# Patient Record
Sex: Male | Born: 1945 | Race: White | Hispanic: No | Smoking: Former smoker
Health system: Southern US, Community
[De-identification: ages and names within clinical notes are randomized; demographics above are authoritative.]

## PROBLEM LIST (undated history)

## (undated) DIAGNOSIS — T7840XA Allergy, unspecified, initial encounter: Secondary | ICD-10-CM

## (undated) DIAGNOSIS — G4730 Sleep apnea, unspecified: Secondary | ICD-10-CM

## (undated) DIAGNOSIS — F419 Anxiety disorder, unspecified: Secondary | ICD-10-CM

## (undated) DIAGNOSIS — M109 Gout, unspecified: Secondary | ICD-10-CM

## (undated) DIAGNOSIS — D649 Anemia, unspecified: Secondary | ICD-10-CM

## (undated) DIAGNOSIS — R0600 Dyspnea, unspecified: Secondary | ICD-10-CM

## (undated) DIAGNOSIS — I2510 Atherosclerotic heart disease of native coronary artery without angina pectoris: Secondary | ICD-10-CM

## (undated) DIAGNOSIS — G629 Polyneuropathy, unspecified: Secondary | ICD-10-CM

## (undated) DIAGNOSIS — K759 Inflammatory liver disease, unspecified: Secondary | ICD-10-CM

## (undated) DIAGNOSIS — R519 Headache, unspecified: Secondary | ICD-10-CM

## (undated) DIAGNOSIS — E119 Type 2 diabetes mellitus without complications: Secondary | ICD-10-CM

## (undated) DIAGNOSIS — K432 Incisional hernia without obstruction or gangrene: Secondary | ICD-10-CM

## (undated) DIAGNOSIS — E785 Hyperlipidemia, unspecified: Secondary | ICD-10-CM

## (undated) DIAGNOSIS — I10 Essential (primary) hypertension: Secondary | ICD-10-CM

## (undated) DIAGNOSIS — R079 Chest pain, unspecified: Secondary | ICD-10-CM

## (undated) HISTORY — DX: Essential (primary) hypertension: I10

## (undated) HISTORY — PX: TONSILLECTOMY: SUR1361

## (undated) HISTORY — DX: Gout, unspecified: M10.9

## (undated) HISTORY — DX: Polyneuropathy, unspecified: G62.9

## (undated) HISTORY — DX: Chest pain, unspecified: R07.9

## (undated) HISTORY — DX: Atherosclerotic heart disease of native coronary artery without angina pectoris: I25.10

## (undated) HISTORY — DX: Anxiety disorder, unspecified: F41.9

## (undated) HISTORY — DX: Headache, unspecified: R51.9

## (undated) HISTORY — DX: Allergy, unspecified, initial encounter: T78.40XA

## (undated) HISTORY — DX: Inflammatory liver disease, unspecified: K75.9

## (undated) HISTORY — DX: Type 2 diabetes mellitus without complications: E11.9

## (undated) HISTORY — PX: OTHER SURGICAL HISTORY: SHX169

## (undated) HISTORY — DX: Incisional hernia without obstruction or gangrene: K43.2

## (undated) HISTORY — DX: Hyperlipidemia, unspecified: E78.5

## (undated) HISTORY — DX: Anemia, unspecified: D64.9

## (undated) HISTORY — DX: Dyspnea, unspecified: R06.00

## (undated) HISTORY — DX: Sleep apnea, unspecified: G47.30

---

## 2000-08-12 ENCOUNTER — Emergency Department (HOSPITAL_COMMUNITY): Admission: EM | Admit: 2000-08-12 | Discharge: 2000-08-12 | Payer: Self-pay | Admitting: Emergency Medicine

## 2000-08-24 ENCOUNTER — Emergency Department (HOSPITAL_COMMUNITY): Admission: EM | Admit: 2000-08-24 | Discharge: 2000-08-24 | Payer: Self-pay | Admitting: Emergency Medicine

## 2000-08-26 ENCOUNTER — Emergency Department (HOSPITAL_COMMUNITY): Admission: EM | Admit: 2000-08-26 | Discharge: 2000-08-26 | Payer: Self-pay | Admitting: Emergency Medicine

## 2000-08-26 ENCOUNTER — Encounter: Payer: Self-pay | Admitting: Neurology

## 2004-10-27 ENCOUNTER — Emergency Department: Payer: Self-pay | Admitting: Emergency Medicine

## 2008-04-30 HISTORY — PX: OTHER SURGICAL HISTORY: SHX169

## 2012-01-29 DEATH — deceased

## 2018-02-21 ENCOUNTER — Encounter: Payer: Self-pay | Admitting: Cardiovascular Disease

## 2018-02-25 ENCOUNTER — Encounter

## 2018-02-25 ENCOUNTER — Encounter: Payer: Self-pay | Admitting: Cardiovascular Disease

## 2018-02-25 ENCOUNTER — Ambulatory Visit: Payer: Medicare HMO | Admitting: Cardiovascular Disease

## 2018-02-25 DIAGNOSIS — R079 Chest pain, unspecified: Secondary | ICD-10-CM | POA: Insufficient documentation

## 2018-02-25 DIAGNOSIS — I208 Other forms of angina pectoris: Secondary | ICD-10-CM

## 2018-02-25 DIAGNOSIS — E7800 Pure hypercholesterolemia, unspecified: Secondary | ICD-10-CM

## 2018-02-25 DIAGNOSIS — I10 Essential (primary) hypertension: Secondary | ICD-10-CM

## 2018-02-25 DIAGNOSIS — E785 Hyperlipidemia, unspecified: Secondary | ICD-10-CM | POA: Insufficient documentation

## 2018-02-25 LAB — BASIC METABOLIC PANEL
BUN/Creatinine Ratio: 12 (ref 10–24)
BUN: 13 mg/dL (ref 8–27)
CALCIUM: 9.6 mg/dL (ref 8.6–10.2)
CO2: 20 mmol/L (ref 20–29)
CREATININE: 1.11 mg/dL (ref 0.76–1.27)
Chloride: 95 mmol/L — ABNORMAL LOW (ref 96–106)
GFR calc Af Amer: 75 mL/min/1.73 (ref >59)
GFR, EST NON AFRICAN AMERICAN: 65 mL/min/1.73 (ref >59)
Glucose: 168 mg/dL — ABNORMAL HIGH (ref 65–99)
Potassium: 4.1 mmol/L (ref 3.5–5.2)
Sodium: 134 mmol/L (ref 134–144)

## 2018-02-25 LAB — CBC WITH DIFFERENTIAL/PLATELET
BASOS: 1 %
Basophils Absolute: 0.1 x10E3/uL (ref 0.0–0.2)
EOS (ABSOLUTE): 0.2 x10E3/uL (ref 0.0–0.4)
EOS: 3 %
Hematocrit: 34.9 % — ABNORMAL LOW (ref 37.5–51.0)
Hemoglobin: 11.8 g/dL — ABNORMAL LOW (ref 13.0–17.7)
IMMATURE GRANS (ABS): 0.1 x10E3/uL (ref 0.0–0.1)
IMMATURE GRANULOCYTES: 1 %
LYMPHS: 18 %
Lymphocytes Absolute: 1.3 x10E3/uL (ref 0.7–3.1)
MCH: 28.1 pg (ref 26.6–33.0)
MCHC: 33.8 g/dL (ref 31.5–35.7)
MCV: 83 fL (ref 79–97)
Monocytes Absolute: 0.6 x10E3/uL (ref 0.1–0.9)
Monocytes: 9 %
Neutrophils Absolute: 4.8 x10E3/uL (ref 1.4–7.0)
Neutrophils: 68 %
PLATELETS: 214 x10E3/uL (ref 150–450)
RBC: 4.2 x10E6/uL (ref 4.14–5.80)
RDW: 14.5 % (ref 12.3–15.4)
WBC: 7 x10E3/uL (ref 3.4–10.8)

## 2018-02-25 LAB — TSH: TSH: 3.64 uIU/mL (ref 0.450–4.500)

## 2018-02-25 NOTE — Progress Notes (Signed)
   02/25/2018 [name]   02/22/1943  [id number]  Primary Physician Arvind, Moogali M, MD Primary Cardiologist: Adynn Caseres J Xena Propst MD FACP, FACC, FAHA, FSCAI  HPI:  [name] is a 72 y.o. moderately overweight widowed Caucasian male father of 2, grandfather of 6 granddaughters referred by Dr. Rosario for cardiac catheterization because of new onset exertional chest pain and an abnormal Myoview stress test.  His risk factors include treated hypertension, diabetes and hyperlipidemia.  He is never had a heart attack or stroke.  There is no family history of heart disease.  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  He smoked remotely and stopped in 1982.  He is fairly active and golfs several times a week.  He gets chest pain after the first hole.  Recent 2D echo was essentially normal and a Myoview stress test did show inferior nontransmural scar with moderate lateral ischemic changes.   Current Meds  Medication Sig  . amitriptyline (ELAVIL) 50 MG tablet Take 50 mg by mouth daily.  . amLODipine (NORVASC) 5 MG tablet Take 5 mg by mouth daily.  . aspirin 325 MG tablet Take 325 mg by mouth daily.  . Calcium Carb-Cholecalciferol (CALCIUM 600+D3) 600-800 MG-UNIT TABS Take by mouth.  . Cholecalciferol (VITAMIN D3) 2000 units TABS Take 2,000 Units by mouth daily.  . docusate sodium (COLACE) 100 MG capsule Take 100 mg by mouth daily.  . finasteride (PROSCAR) 5 MG tablet Take 5 mg by mouth daily.  . loratadine (CLARITIN) 10 MG tablet Take 10 mg by mouth daily.  . losartan (COZAAR) 100 MG tablet Take 100 mg by mouth daily.  . metFORMIN (GLUCOPHAGE) 1000 MG tablet Take 1,000 mg by mouth 2 (two) times daily.  . Multiple Vitamins-Minerals (MULTIVITAMIN ADULT PO) Take 1 tablet by mouth daily.  . nitroGLYCERIN (NITROSTAT) 0.4 MG SL tablet   . pravastatin (PRAVACHOL) 40 MG tablet Take 40 mg by mouth daily.     Allergies  Allergen Reactions  . Ciprofloxacin     Social  History   Socioeconomic History  . Marital status: Single    Spouse name: Not on file  . Number of children: Not on file  . Years of education: Not on file  . Highest education level: Not on file  Occupational History  . Not on file  Social Needs  . Financial resource strain: Not on file  . Food insecurity:    Worry: Not on file    Inability: Not on file  . Transportation needs:    Medical: Not on file    Non-medical: Not on file  Tobacco Use  . Smoking status: Former Smoker  . Smokeless tobacco: Never Used  Substance and Sexual Activity  . Alcohol use: Not on file  . Drug use: Not on file  . Sexual activity: Not on file  Lifestyle  . Physical activity:    Days per week: Not on file    Minutes per session: Not on file  . Stress: Not on file  Relationships  . Social connections:    Talks on phone: Not on file    Gets together: Not on file    Attends religious service: Not on file    Active member of club or organization: Not on file    Attends meetings of clubs or organizations: Not on file    Relationship status: Not on file  . Intimate partner violence:    Fear of current or ex partner:   Not on file    Emotionally abused: Not on file    Physically abused: Not on file    Forced sexual activity: Not on file  Other Topics Concern  . Not on file  Social History Narrative  . Not on file     Review of Systems: General: negative for chills, fever, night sweats or weight changes.  Cardiovascular: negative for chest pain, dyspnea on exertion, edema, orthopnea, palpitations, paroxysmal nocturnal dyspnea or shortness of breath Dermatological: negative for rash Respiratory: negative for cough or wheezing Urologic: negative for hematuria Abdominal: negative for nausea, vomiting, diarrhea, bright red blood per rectum, melena, or hematemesis Neurologic: negative for visual changes, syncope, or dizziness All other systems reviewed and are otherwise negative except as noted  above.    Blood pressure 126/80, pulse 96, height 5' 11" (1.803 m), weight 278 lb (126.1 kg).  General appearance: alert and no distress Neck: no adenopathy, no carotid bruit, no JVD, supple, symmetrical, trachea midline and thyroid not enlarged, symmetric, no tenderness/mass/nodules Lungs: clear to auscultation bilaterally Heart: regular rate and rhythm, S1, S2 normal, no murmur, click, rub or gallop Extremities: extremities normal, atraumatic, no cyanosis or edema Pulses: 2+ and symmetric Skin: Skin color, texture, turgor normal. No rashes or lesions Neurologic: Alert and oriented X 3, normal strength and tone. Normal symmetric reflexes. Normal coordination and gait  EKG sinus rhythm at 96 with nonspecific ST and T wave changes.  Personally reviewed this EKG.  ASSESSMENT AND PLAN:   Chest pain [name] was referred by Dr. Rosario for cardiac catheterization because of new onset predictable exertional chest pain and abnormal stress test.  We will proceed with outpatient radial diagnostic cath this Thursday. I have reviewed the risks, indications, and alternatives to cardiac catheterization, possible angioplasty, and stenting with the patient. Risks include but are not limited to bleeding, infection, vascular injury, stroke, myocardial infection, arrhythmia, kidney injury, radiation-related injury in the case of prolonged fluoroscopy use, emergency cardiac surgery, and death. The patient understands the risks of serious complication is 1-2 in 1000 with diagnostic cardiac cath and 1-2% or less with angioplasty/stenting.   Essential hypertension History of essential hypertension her blood pressure measured today at 126/80.  He is on amlodipine and losartan.  Continue current meds at current dosing.  Hyperlipidemia History of hyperlipidemia on statin therapy      Landrum Carbonell J. Jamel Dunton MD FACP,FACC,FAHA, FSCAI 02/25/2018 9:10 AM 

## 2018-02-25 NOTE — Patient Instructions (Signed)
Medication Instructions:  Your physician recommends that you continue on your current medications as directed. Please refer to the Current Medication list given to you today.  If you need a refill on your cardiac medications before your next appointment, please call your pharmacy.   Lab work: Your physician recommends that you return for lab work in: TODAY  If you have labs (blood work) drawn today and your tests are completely normal, you will receive your results only by: Marland Kitchen MyChart Message (if you have MyChart) OR . A paper copy in the mail If you have any lab test that is abnormal or we need to change your treatment, we will call you to review the results.  Testing/Procedures: Your physician has requested that you have a cardiac catheterization. Cardiac catheterization is used to diagnose and/or treat various heart conditions. Doctors may recommend this procedure for a number of different reasons. The most common reason is to evaluate chest pain. Chest pain can be a symptom of coronary artery disease (CAD), and cardiac catheterization can show whether plaque is narrowing or blocking your heart's arteries. This procedure is also used to evaluate the valves, as well as measure the blood flow and oxygen levels in different parts of your heart. For further information please visit [email or web address] Please follow instruction sheet, as given.   Follow-Up: At [facility], you and your health needs are our priority.  As part of our continuing mission to provide you with exceptional heart care, we have created designated Provider Care Teams.  These Care Teams include your primary Cardiologist (physician) and Advanced Practice Providers (APPs -  Physician Assistants and Nurse Practitioners) who all work together to provide you with the care you need, when you need it. You will need a follow up appointment in 4 weeks.  Please call our office 2 months in advance to schedule this appointment.  You may  see Dr. Allyson Sabal or one of the following Advanced Practice Providers on your designated Care Team:   Corine Shelter, PA-C Judy Pimple, [place] . Marjie Skiff, PA-C  Any Other Special Instructions Will Be Listed Below (If Applicable).

## 2018-02-25 NOTE — Assessment & Plan Note (Signed)
History of hyperlipidemia on statin therapy. 

## 2018-02-25 NOTE — H&P (View-Only) (Signed)
02/25/2018 [name]   1942-07-01  [id number]  Primary Physician Karle Plumber, MD Primary Cardiologist: Runell Gess MD Nicholes Calamity, [place]  HPI:  [name] is a 72 y.o. moderately overweight widowed Caucasian male father of 2, grandfather of 6 granddaughters referred by Dr. Hanley Hays for cardiac catheterization because of new onset exertional chest pain and an abnormal Myoview stress test.  His risk factors include treated hypertension, diabetes and hyperlipidemia.  He is never had a heart attack or stroke.  There is no family history of heart disease.  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  He smoked remotely and stopped in 1982.  He is fairly active and golfs several times a week.  He gets chest pain after the first hole.  Recent 2D echo was essentially normal and a Myoview stress test did show inferior nontransmural scar with moderate lateral ischemic changes.   Current Meds  Medication Sig  . amitriptyline (ELAVIL) 50 MG tablet Take 50 mg by mouth daily.  Marland Kitchen amLODipine (NORVASC) 5 MG tablet Take 5 mg by mouth daily.  Marland Kitchen aspirin 325 MG tablet Take 325 mg by mouth daily.  . Calcium Carb-Cholecalciferol (CALCIUM 600+D3) 600-800 MG-UNIT TABS Take by mouth.  . Cholecalciferol (VITAMIN D3) 2000 units TABS Take 2,000 Units by mouth daily.  Marland Kitchen docusate sodium (COLACE) 100 MG capsule Take 100 mg by mouth daily.  . finasteride (PROSCAR) 5 MG tablet Take 5 mg by mouth daily.  Marland Kitchen loratadine (CLARITIN) 10 MG tablet Take 10 mg by mouth daily.  Marland Kitchen losartan (COZAAR) 100 MG tablet Take 100 mg by mouth daily.  . metFORMIN (GLUCOPHAGE) 1000 MG tablet Take 1,000 mg by mouth 2 (two) times daily.  . Multiple Vitamins-Minerals (MULTIVITAMIN ADULT PO) Take 1 tablet by mouth daily.  . nitroGLYCERIN (NITROSTAT) 0.4 MG SL tablet   . pravastatin (PRAVACHOL) 40 MG tablet Take 40 mg by mouth daily.     Allergies  Allergen Reactions  . Ciprofloxacin     Social  History   Socioeconomic History  . Marital status: Single    Spouse name: Not on file  . Number of children: Not on file  . Years of education: Not on file  . Highest education level: Not on file  Occupational History  . Not on file  Social Needs  . Financial resource strain: Not on file  . Food insecurity:    Worry: Not on file    Inability: Not on file  . Transportation needs:    Medical: Not on file    Non-medical: Not on file  Tobacco Use  . Smoking status: Former Games developer  . Smokeless tobacco: Never Used  Substance and Sexual Activity  . Alcohol use: Not on file  . Drug use: Not on file  . Sexual activity: Not on file  Lifestyle  . Physical activity:    Days per week: Not on file    Minutes per session: Not on file  . Stress: Not on file  Relationships  . Social connections:    Talks on phone: Not on file    Gets together: Not on file    Attends religious service: Not on file    Active member of club or organization: Not on file    Attends meetings of clubs or organizations: Not on file    Relationship status: Not on file  . Intimate partner violence:    Fear of current or ex partner:  Not on file    Emotionally abused: Not on file    Physically abused: Not on file    Forced sexual activity: Not on file  Other Topics Concern  . Not on file  Social History Narrative  . Not on file     Review of Systems: General: negative for chills, fever, night sweats or weight changes.  Cardiovascular: negative for chest pain, dyspnea on exertion, edema, orthopnea, palpitations, paroxysmal nocturnal dyspnea or shortness of breath Dermatological: negative for rash Respiratory: negative for cough or wheezing Urologic: negative for hematuria Abdominal: negative for nausea, vomiting, diarrhea, bright red blood per rectum, melena, or hematemesis Neurologic: negative for visual changes, syncope, or dizziness All other systems reviewed and are otherwise negative except as noted  above.    Blood pressure 126/80, pulse 96, height 5\' 11"  (1.803 m), weight 278 lb (126.1 kg).  General appearance: alert and no distress Neck: no adenopathy, no carotid bruit, no JVD, supple, symmetrical, trachea midline and thyroid not enlarged, symmetric, no tenderness/mass/nodules Lungs: clear to auscultation bilaterally Heart: regular rate and rhythm, S1, S2 normal, no murmur, click, rub or gallop Extremities: extremities normal, atraumatic, no cyanosis or edema Pulses: 2+ and symmetric Skin: Skin color, texture, turgor normal. No rashes or lesions Neurologic: Alert and oriented X 3, normal strength and tone. Normal symmetric reflexes. Normal coordination and gait  EKG sinus rhythm at 96 with nonspecific ST and T wave changes.  Personally reviewed this EKG.  ASSESSMENT AND PLAN:   Chest pain Mr. Wurzer was referred by Dr. Hanley Hays for cardiac catheterization because of new onset predictable exertional chest pain and abnormal stress test.  We will proceed with outpatient radial diagnostic cath this Thursday. I have reviewed the risks, indications, and alternatives to cardiac catheterization, possible angioplasty, and stenting with the patient. Risks include but are not limited to bleeding, infection, vascular injury, stroke, myocardial infection, arrhythmia, kidney injury, radiation-related injury in the case of prolonged fluoroscopy use, emergency cardiac surgery, and death. The patient understands the risks of serious complication is 1-2 in 1000 with diagnostic cardiac cath and 1-2% or less with angioplasty/stenting.   Essential hypertension History of essential hypertension her blood pressure measured today at 126/80.  He is on amlodipine and losartan.  Continue current meds at current dosing.  Hyperlipidemia History of hyperlipidemia on statin therapy      Runell Gess MD [facility] 02/25/2018 9:10 AM

## 2018-02-25 NOTE — Assessment & Plan Note (Signed)
History of essential hypertension her blood pressure measured today at 126/80.  He is on amlodipine and losartan.  Continue current meds at current dosing.

## 2018-02-25 NOTE — Assessment & Plan Note (Signed)
Mr. Schwartz was referred by Dr. Hanley Hays for cardiac catheterization because of new onset predictable exertional chest pain and abnormal stress test.  We will proceed with outpatient radial diagnostic cath this Thursday. I have reviewed the risks, indications, and alternatives to cardiac catheterization, possible angioplasty, and stenting with the patient. Risks include but are not limited to bleeding, infection, vascular injury, stroke, myocardial infection, arrhythmia, kidney injury, radiation-related injury in the case of prolonged fluoroscopy use, emergency cardiac surgery, and death. The patient understands the risks of serious complication is 1-2 in 1000 with diagnostic cardiac cath and 1-2% or less with angioplasty/stenting.

## 2018-02-26 ENCOUNTER — Telehealth: Payer: Self-pay | Admitting: *Deleted

## 2018-02-26 NOTE — Telephone Encounter (Signed)
Pt contacted pre-catheterization scheduled at [facility] for: Thursday February 27, 2018 9:30 AM Verified arrival time and place: [facility] Main Entrance A at: 7:30 AM  No solid food after midnight prior to cath, clear liquids until 5 AM day of procedure. Contrast allergy: no  Hold: Metformin-day of procedure and 48 hours post procedure.  Except hold medications AM meds can be  taken pre-cath with sip of water including: ASA 325 mg   Pt states he was instructed by Dr Allyson Sabal to hold losartan and norvasc AM of procedure.  Confirmed patient has responsible person to drive home post procedure and for 24 hours after you arrive home: yes

## 2018-02-27 ENCOUNTER — Ambulatory Visit (HOSPITAL_COMMUNITY)
Admission: RE | Admit: 2018-02-27 | Discharge: 2018-02-27 | Disposition: A | Discharge status: Home / Self Care | Payer: Medicare HMO | Source: Ambulatory Visit | Attending: Cardiovascular Disease | Admitting: Cardiovascular Disease

## 2018-02-27 ENCOUNTER — Encounter (HOSPITAL_COMMUNITY): Payer: Self-pay | Admitting: Cardiovascular Disease

## 2018-02-27 ENCOUNTER — Encounter (HOSPITAL_COMMUNITY)
Admission: RE | Disposition: A | Discharge status: Home / Self Care | Payer: Self-pay | Source: Ambulatory Visit | Attending: Cardiovascular Disease

## 2018-02-27 ENCOUNTER — Other Ambulatory Visit: Payer: Self-pay

## 2018-02-27 DIAGNOSIS — Z87891 Personal history of nicotine dependence: Secondary | ICD-10-CM | POA: Insufficient documentation

## 2018-02-27 DIAGNOSIS — I10 Essential (primary) hypertension: Secondary | ICD-10-CM | POA: Insufficient documentation

## 2018-02-27 DIAGNOSIS — E663 Overweight: Secondary | ICD-10-CM | POA: Diagnosis not present

## 2018-02-27 DIAGNOSIS — Z7982 Long term (current) use of aspirin: Secondary | ICD-10-CM | POA: Diagnosis not present

## 2018-02-27 DIAGNOSIS — I2584 Coronary atherosclerosis due to calcified coronary lesion: Secondary | ICD-10-CM | POA: Diagnosis not present

## 2018-02-27 DIAGNOSIS — I208 Other forms of angina pectoris: Secondary | ICD-10-CM

## 2018-02-27 DIAGNOSIS — Z7984 Long term (current) use of oral hypoglycemic drugs: Secondary | ICD-10-CM | POA: Insufficient documentation

## 2018-02-27 DIAGNOSIS — E119 Type 2 diabetes mellitus without complications: Secondary | ICD-10-CM | POA: Insufficient documentation

## 2018-02-27 DIAGNOSIS — E785 Hyperlipidemia, unspecified: Secondary | ICD-10-CM | POA: Diagnosis not present

## 2018-02-27 DIAGNOSIS — I25118 Atherosclerotic heart disease of native coronary artery with other forms of angina pectoris: Secondary | ICD-10-CM | POA: Insufficient documentation

## 2018-02-27 DIAGNOSIS — Z6838 Body mass index (BMI) 38.0-38.9, adult: Secondary | ICD-10-CM | POA: Insufficient documentation

## 2018-02-27 DIAGNOSIS — E7800 Pure hypercholesterolemia, unspecified: Secondary | ICD-10-CM

## 2018-02-27 HISTORY — PX: LEFT HEART CATH AND CORONARY ANGIOGRAPHY: CATH118249

## 2018-02-27 LAB — GLUCOSE, CAPILLARY: Glucose-Capillary: 171 mg/dL — ABNORMAL HIGH (ref 70–99)

## 2018-02-27 SURGERY — LEFT HEART CATH AND CORONARY ANGIOGRAPHY
Anesthesia: LOCAL

## 2018-02-27 MED ORDER — LIDOCAINE HCL (PF) 1 % IJ SOLN
INTRAMUSCULAR | Status: AC
Start: 2018-02-27 — End: ?
  Filled 2018-02-27: qty 30

## 2018-02-27 MED ORDER — MIDAZOLAM HCL 2 MG/2ML IJ SOLN
INTRAMUSCULAR | Status: DC | PRN
Start: 2018-02-27 — End: 2018-02-27
  Administered 2018-02-27: 1 mg via INTRAVENOUS

## 2018-02-27 MED ORDER — ASPIRIN 81 MG PO CHEW
81.0000 mg | Freq: Every day | ORAL | Status: DC
Start: 2018-02-28 — End: 2018-02-27

## 2018-02-27 MED ORDER — FENTANYL CITRATE (PF) 100 MCG/2ML IJ SOLN
INTRAMUSCULAR | Status: DC | PRN
Start: 2018-02-27 — End: 2018-02-27
  Administered 2018-02-27: 25 ug via INTRAVENOUS

## 2018-02-27 MED ORDER — SODIUM CHLORIDE 0.9 % WEIGHT BASED INFUSION
3.0000 mL/kg/h | INTRAVENOUS | Status: AC
Start: 2018-02-27 — End: 2018-02-27
  Administered 2018-02-27: 3 mL/kg/h via INTRAVENOUS

## 2018-02-27 MED ORDER — SODIUM CHLORIDE 0.9% FLUSH
3.0000 mL | INTRAVENOUS | Status: DC | PRN
Start: 2018-02-27 — End: 2018-02-27

## 2018-02-27 MED ORDER — ACETAMINOPHEN 325 MG PO TABS
650.0000 mg | ORAL | Status: DC | PRN
Start: 2018-02-27 — End: 2018-02-27

## 2018-02-27 MED ORDER — LIDOCAINE HCL (PF) 1 % IJ SOLN
INTRAMUSCULAR | Status: DC | PRN
Start: 2018-02-27 — End: 2018-02-27
  Administered 2018-02-27: 2 mL

## 2018-02-27 MED ORDER — NITROGLYCERIN 1 MG/10 ML FOR IR/CATH LAB
INTRA_ARTERIAL | Status: AC
Start: 2018-02-27 — End: ?
  Filled 2018-02-27: qty 10

## 2018-02-27 MED ORDER — NITROGLYCERIN 1 MG/10 ML FOR IR/CATH LAB
INTRA_ARTERIAL | Status: DC | PRN
Start: 2018-02-27 — End: 2018-02-27
  Administered 2018-02-27: 8 mL via INTRA_ARTERIAL

## 2018-02-27 MED ORDER — MORPHINE SULFATE (PF) 10 MG/ML IV SOLN
2.0000 mg | INTRAVENOUS | Status: DC | PRN
Start: 2018-02-27 — End: 2018-02-27

## 2018-02-27 MED ORDER — HEPARIN SODIUM (PORCINE) 1000 UNIT/ML IJ SOLN
INTRAMUSCULAR | Status: DC | PRN
Start: 2018-02-27 — End: 2018-02-27
  Administered 2018-02-27: 7000 [IU] via INTRAVENOUS

## 2018-02-27 MED ORDER — ASPIRIN 81 MG PO CHEW
81.0000 mg | ORAL | Status: DC
Start: 2018-02-27 — End: 2018-02-27

## 2018-02-27 MED ORDER — SODIUM CHLORIDE 0.9% FLUSH
3.0000 mL | Freq: Two times a day (BID) | INTRAVENOUS | Status: DC
Start: 2018-02-27 — End: 2018-02-27

## 2018-02-27 MED ORDER — HEPARIN (PORCINE) IN NACL 1000-0.9 UT/500ML-% IV SOLN
INTRAVENOUS | Status: DC | PRN
Start: 2018-02-27 — End: 2018-02-27
  Administered 2018-02-27 (×2): 500 mL

## 2018-02-27 MED ORDER — HEPARIN SODIUM (PORCINE) 1000 UNIT/ML IJ SOLN
INTRAMUSCULAR | Status: AC
Start: 2018-02-27 — End: ?
  Filled 2018-02-27: qty 1

## 2018-02-27 MED ORDER — IOHEXOL 350 MG/ML SOLN
INTRAVENOUS | Status: DC | PRN
Start: 2018-02-27 — End: 2018-02-27
  Administered 2018-02-27: 85 mL via INTRAVENOUS

## 2018-02-27 MED ORDER — ONDANSETRON HCL 4 MG/2ML IJ SOLN
4.0000 mg | Freq: Four times a day (QID) | INTRAMUSCULAR | Status: DC | PRN
Start: 2018-02-27 — End: 2018-02-27

## 2018-02-27 MED ORDER — ATORVASTATIN CALCIUM 80 MG PO TABS
80.0000 mg | Freq: Every day | ORAL | Status: DC
Start: 2018-02-27 — End: 2018-02-27
  Filled 2018-02-27: qty 1

## 2018-02-27 MED ORDER — FENTANYL CITRATE (PF) 100 MCG/2ML IJ SOLN
INTRAMUSCULAR | Status: AC
Start: 2018-02-27 — End: ?
  Filled 2018-02-27: qty 2

## 2018-02-27 MED ORDER — SODIUM CHLORIDE 0.9 % IV SOLN
250.0000 mL | INTRAVENOUS | Status: DC | PRN
Start: 2018-02-27 — End: 2018-02-27

## 2018-02-27 MED ORDER — HEPARIN (PORCINE) IN NACL 1000-0.9 UT/500ML-% IV SOLN
INTRAVENOUS | Status: AC
Start: 2018-02-27 — End: ?
  Filled 2018-02-27: qty 500

## 2018-02-27 MED ORDER — MIDAZOLAM HCL 2 MG/2ML IJ SOLN
INTRAMUSCULAR | Status: AC
Start: 2018-02-27 — End: ?
  Filled 2018-02-27: qty 2

## 2018-02-27 MED ORDER — SODIUM CHLORIDE 0.9 % IV SOLN
INTRAVENOUS | Status: AC
Start: 2018-02-27 — End: 2018-02-27

## 2018-02-27 MED ORDER — VERAPAMIL HCL 2.5 MG/ML IV SOLN
INTRAVENOUS | Status: AC
Start: 2018-02-27 — End: ?
  Filled 2018-02-27: qty 2

## 2018-02-27 MED ORDER — SODIUM CHLORIDE 0.9 % WEIGHT BASED INFUSION
1.0000 mL/kg/h | INTRAVENOUS | Status: DC
Start: 2018-02-27 — End: 2018-02-27

## 2018-02-27 SURGICAL SUPPLY — 11 items
CATH INFINITI 5 FR JL3.5 (CATHETERS) ×2
CATH INFINITI 5FR ANG PIGTAIL (CATHETERS) ×2
CATH OPTITORQUE TIG 4.0 5F (CATHETERS) ×2
DEVICE RAD COMP TR BAND LRG (VASCULAR PRODUCTS) ×2
GLIDESHEATH SLEND A-KIT 6F 22G (SHEATH) ×2
INQWIRE 1.5J .035X260CM (WIRE) ×2
KIT HEART LEFT (KITS) ×2
PACK CARDIAC CATHETERIZATION (CUSTOM PROCEDURE TRAY) ×2
TRANSDUCER W/STOPCOCK (MISCELLANEOUS) ×2
TUBING CIL FLEX 10 FLL-RA (TUBING) ×2
WIRE HI TORQ VERSACORE-J 145CM (WIRE) ×2

## 2018-02-27 NOTE — Discharge Instructions (Signed)
Drink plenty of fluids over next 48 hours and keep right wrist elevated at heart level for 24 hours ° °Radial Site Care °Refer to this sheet in the next few weeks. These instructions provide you with information about caring for yourself after your procedure. Your health care provider may also give you more specific instructions. Your treatment has been planned according to current medical practices, but problems sometimes occur. Call your health care provider if you have any problems or questions after your procedure. °What can I expect after the procedure? °After your procedure, it is typical to have the following: °· Bruising at the radial site that usually fades within 1-2 weeks. °· Blood collecting in the tissue (hematoma) that may be painful to the touch. It should usually decrease in size and tenderness within 1-2 weeks. ° °Follow these instructions at home: °· Take medicines only as directed by your health care provider. °· You may shower 24-48 hours after the procedure or as directed by your health care provider. Remove the bandage (dressing) and gently wash the site with plain soap and water. Pat the area dry with a clean towel. Do not rub the site, because this may cause bleeding. °· Do not take baths, swim, or use a hot tub until your health care provider approves. °· Check your insertion site every day for redness, swelling, or drainage. °· Do not apply powder or lotion to the site. °· Do not flex or bend the affected arm for 24 hours or as directed by your health care provider. °· Do not push or pull heavy objects with the affected arm for 24 hours or as directed by your health care provider. °· Do not lift over 10 lb (4.5 kg) for 5 days after your procedure or as directed by your health care provider. °· Ask your health care provider when it is okay to: °? Return to work or school. °? Resume usual physical activities or sports. °? Resume sexual activity. °· Do not drive home if you are discharged the  same day as the procedure. Have someone else drive you. °· You may drive 24 hours after the procedure unless otherwise instructed by your health care provider. °· Do not operate machinery or power tools for 24 hours after the procedure. °· If your procedure was done as an outpatient procedure, which means that you went home the same day as your procedure, a responsible adult should be with you for the first 24 hours after you arrive home. °· Keep all follow-up visits as directed by your health care provider. This is important. °Contact a health care provider if: °· You have a fever. °· You have chills. °· You have increased bleeding from the radial site. Hold pressure on the site. °Get help right away if: °· You have unusual pain at the radial site. °· You have redness, warmth, or swelling at the radial site. °· You have drainage (other than a small amount of blood on the dressing) from the radial site. °· The radial site is bleeding, and the bleeding does not stop after 30 minutes of holding steady pressure on the site. °· Your arm or hand becomes pale, cool, tingly, or numb. °This information is not intended to replace advice given to you by your health care provider. Make sure you discuss any questions you have with your health care provider. °Document Released: 05/19/2010 Document Revised: 09/22/2015 Document Reviewed: 11/02/2013 °Elsevier Interactive Patient Education © 2018 [employer] Inc[employer] ° °

## 2018-02-27 NOTE — Interval H&P Note (Signed)
Cath Lab Visit (complete for each Cath Lab visit)  Clinical Evaluation Leading to the Procedure:   ACS: No.  Non-ACS:    Anginal Classification: CCS II  Anti-ischemic medical therapy: Minimal Therapy (1 class of medications)  Non-Invasive Test Results: Intermediate-risk stress test findings: cardiac mortality 1-3%/year  Prior CABG: No previous CABG      History and Physical Interval Note:  02/27/2018 9:03 AM  [name]  has presented today for surgery, with the diagnosis of abnormal stress test and echo  The various methods of treatment have been discussed with the patient and family. After consideration of risks, benefits and other options for treatment, the patient has consented to  Procedure(s): LEFT HEART CATH AND CORONARY ANGIOGRAPHY (N/A) as a surgical intervention .  The patient's history has been reviewed, patient examined, no change in status, stable for surgery.  I have reviewed the patient's chart and labs.  Questions were answered to the patient's satisfaction.     Nanetta Batty

## 2018-03-04 ENCOUNTER — Encounter: Payer: Self-pay | Admitting: Cardiothoracic Surgery

## 2018-03-04 ENCOUNTER — Institutional Professional Consult (permissible substitution): Payer: Medicare HMO | Admitting: Cardiothoracic Surgery

## 2018-03-04 VITALS — BP 170/88 | HR 92 | Resp 20 | Ht 71.0 in | Wt 260.0 lb

## 2018-03-04 DIAGNOSIS — Z951 Presence of aortocoronary bypass graft: Secondary | ICD-10-CM | POA: Insufficient documentation

## 2018-03-04 DIAGNOSIS — I2510 Atherosclerotic heart disease of native coronary artery without angina pectoris: Secondary | ICD-10-CM

## 2018-03-04 NOTE — Progress Notes (Signed)
PCP is Karle Plumber, MD Referring Provider is Runell Gess, MD  Chief Complaint  Patient presents with  . Coronary Artery Disease    Surgical eval, Cardiac Cath 02/27/18, ECHO 02/21/18   Patient examined, images of coronary angiogram and report of 2D echocardiogram personally reviewed and counseled with patient HPI: 72 year old obese diabetic reformed smoker with symptoms of exertional angina and positive stress test.  Cardiac catheterization performed last week by Dr. Nanetta Batty demonstrated severe multivessel CAD with a left dominant coronary circulation.  I have reviewed the images and although he has severe diabetic disease, it appears there are graftable vessels for the OM1, distal circumflex, LAD, and possible first diagonal.  Ejection fraction by stress test and echo was normal.  LVEDP is 10.  Patient denies resting angina.  He has no symptoms of heart failure.  He has no history of atrial fibrillation.  Patient has had significant medical problems in the past  1.5 years ago he was admitted to [facility] with sepsis from a left foot infection which required debridement and several weeks of IV antibiotics with pick line  5 years ago following a colonoscopy the patient developed a hepatic abscess which required several weeks of antibiotics as well as a percutaneous drain.  Patient denies any previous thoracic surgery or thoracic injuries.  No history of pneumonia but he does get allergic bronchitis in the spring and fall.  Risk factors for CAD include obesity, dyslipidemia, diabetes, hypertension and probable family history.  Patient is a non-insulin-dependent diabetic taking metformin.  His last hemoglobin A1c was 7.5.  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  His wife passed away 5 years ago and he lives by himself.  He plans on staying with his daughter who lives in [place] for support after  surgery.  Past Medical History:  Diagnosis Date  . Allergies   . Anxiety   . CAD (coronary artery disease)   . Chest pain on exertion   . Diabetes (HCC)   . Gout   . Hepatitis    at 72 years old  . Hyperlipemia   . Hypertension     Past Surgical History:  Procedure Laterality Date  . LEFT HEART CATH AND CORONARY ANGIOGRAPHY N/A 02/27/2018   Procedure: LEFT HEART CATH AND CORONARY ANGIOGRAPHY;  Surgeon: Runell Gess, MD;  Location: [facility];  Service: Cardiovascular;  Laterality: N/A;  . liver absess at 72 years old      Family History  Problem Relation Age of Onset  . Atrial fibrillation Mother   . Stroke Mother   . Cancer Maternal Aunt     Social History Social History   Tobacco Use  . Smoking status: Former Smoker    Types: Cigarettes  . Smokeless tobacco: Never Used  . Tobacco comment: quit 1982  Substance Use Topics  . Alcohol use: Not Currently  . Drug use: Never    Current Outpatient Medications  Medication Sig Dispense Refill  . acetaminophen (TYLENOL) 500 MG tablet Take 1,000 mg by mouth daily as needed for moderate pain.    Marland Kitchen amitriptyline (ELAVIL) 50 MG tablet Take 50 mg by mouth at bedtime.     Marland Kitchen amLODipine (NORVASC) 5 MG tablet Take 5 mg by mouth daily.    Marland Kitchen aspirin 325 MG tablet Take 325 mg by mouth at bedtime.     . Calcium Carb-Cholecalciferol (CALCIUM 600+D3) 600-800 MG-UNIT TABS Take  1 tablet by mouth at bedtime.     . Cholecalciferol (VITAMIN D3) 2000 units TABS Take 2,000 Units by mouth 2 (two) times daily.     Marland Kitchen docusate sodium (COLACE) 100 MG capsule Take 100 mg by mouth 2 (two) times daily.     . finasteride (PROSCAR) 5 MG tablet Take 5 mg by mouth daily.    Marland Kitchen loratadine (CLARITIN) 10 MG tablet Take 10 mg by mouth daily.    Marland Kitchen losartan (COZAAR) 100 MG tablet Take 100 mg by mouth daily.    . metFORMIN (GLUCOPHAGE) 1000 MG tablet Take 1,000 mg by mouth 2 (two) times daily.    . Multiple Vitamins-Minerals (MULTIVITAMIN ADULT PO)  Take 1 tablet by mouth daily.    . nitroGLYCERIN (NITROSTAT) 0.4 MG SL tablet Place 0.4 mg under the tongue every 5 (five) minutes as needed for chest pain.     . pravastatin (PRAVACHOL) 40 MG tablet Take 40 mg by mouth at bedtime.      No current facility-administered medications for this visit.     Allergies  Allergen Reactions  . Ciprofloxacin Rash  . Erythromycin Ethylsuccinate Rash    Review of Systems                     Review of Systems :  [ y ] = yes, [  ] = no        General :  Weight gain [ y  ]    Weight loss  [   ]  Fatigue [  y]  Fever [  ]  Chills  [  ]                                          HEENT    Headache [  ]  Dizziness [  ]  Blurred vision [  ] Glaucoma  [  ]                          Nosebleeds [  ] Painful or loose teeth [ y, left molar]        Cardiac :  Chest pain/ pressure Cove.Etienne  ]  Resting SOB [  ] exertional SOB Cove.Etienne  ]                        Orthopnea [  ]  Pedal edema  [  ]  Palpitations [  ] Syncope/presyncope [ ]                         Paroxysmal nocturnal dyspnea [  ]         Pulmonary : cough Cove.Etienne  ]  wheezing [  ]  Hemoptysis [  ] Sputum [  ] Snoring [  ]                              Pneumothorax [  ]  Sleep apnea [  ]        GI : Vomiting [  ]  Dysphagia [  ]  Melena  [  ]  Abdominal pain [  ] BRBPR [  ]              Heart burn [  ]  Constipation [  ] Diarrhea  [  ] Colonoscopy [   ]        GU : Hematuria [  ]  Dysuria [  ]  Nocturia [  ] UTI's [  ]        Vascular : Claudication [  ]  Rest pain [  ]  DVT [  ] Vein stripping [  ] leg ulcers [  ]                          TIA [  ] Stroke [  ]  Varicose veins [  ]        NEURO :  Headaches  [  ] Seizures [  ] Vision changes [  ] Paresthesias [  ]                                               Musculoskeletal :  Arthritis [  ] Gout  [  ]  Back pain [  ]  Joint pain [  ]        Skin :  Rash [  ]  Melanoma [  ] Sores [  ]        Heme : Bleeding problems [  ]Clotting Disorders [  ] Anemia [   ]Blood Transfusion [ ]         Endocrine : Diabetes [ y ] Heat or Cold intolerance [  ] Polyuria [  ]excessive thirst [ ]         Psych : Depression [  ]  Anxiety Cove.Etienne  ]  Psych hospitalizations [  ] Memory change [  ]                Social history  Patient stopped smoking 1984 He lives alone but still quite active He enjoys playing golf 3 days a week but developed chest pain usually after the first or second hole                                                            BP (!) 170/88   Pulse 92   Resp 20   Ht 5\' 11"  (1.803 m)   Wt 260 lb (117.9 kg)   SpO2 96% Comment: RA  BMI 36.26 kg/m  Physical Exam       Physical Exam  General: Very nice 72 year old gentleman no acute distress, obese HEENT: Normocephalic pupils equal , dentition adequate Neck: Supple without JVD, adenopathy, or bruit Chest: Clear to auscultation, symmetrical breath sounds, no rhonchi, no tenderness             or deformity Cardiovascular: Regular rate and rhythm, no murmur, no gallop, peripheral pulses             palpable in all extremities Abdomen:  Soft, nontender, no palpable mass or organomegaly Extremities: Warm, well-perfused, no clubbing cyanosis edema or tenderness,              no venous stasis changes of the legs Rectal/GU: Deferred Neuro: Grossly non--focal and symmetrical throughout Skin: Clean and dry without rash or ulceration  Diagnostic Tests: Severe three-vessel coronary disease by angiogram Echo report shows EF 55% without significant valve disease  Impression: I agree that the patient would benefit from multivessel CABG for improved survival, and preservation of LV function, and improved symptoms.  He will stop his Elavil now.  His surgery will be scheduled for November 19 at [facility].  This is at his request so that his daughter will be present after she travels the mid part of November.  He will stop his metformin 2 days before surgery.  I have prescribed the patient  Toprol-XL 25 mg daily to provide him beta-blocker protection as well as to help with his hypertension  Plan: Surgery scheduled for November 19, multivessel CABG. Benefits, risks, alternatives, and expected postop recovery all reviewed with patient.  Mikey Bussing, MD [facility] [phone]

## 2018-03-05 ENCOUNTER — Other Ambulatory Visit: Payer: Self-pay | Admitting: *Deleted

## 2018-03-05 ENCOUNTER — Encounter

## 2018-03-05 ENCOUNTER — Encounter: Payer: Self-pay | Admitting: *Deleted

## 2018-03-05 DIAGNOSIS — I2510 Atherosclerotic heart disease of native coronary artery without angina pectoris: Secondary | ICD-10-CM

## 2018-03-13 NOTE — Pre-Procedure Instructions (Addendum)
Kizzie FantasiaLarry F Tatem  03/13/2018      [facility] - [place], [place] - [address] [place] [id number] Phone: [phone] Fax: [phone]    Your procedure is scheduled on Tuesday, Nov. 19th   [facility] at  0530 am             (posted surgery time 7:30a - 1:30p)   Call this number if you have problems the morning of surgery:  [phone]   Remember:   Do not eat any foods or drink any liquids after midnight, Monday.                          Please follow instructions per Dr. Zenaida NieceVan Trigt's office concerning the continuation of Aspirin.              As of today, STOP taking any Vitamins, Herbal Supplements, Anti-inflammatories.     Take these medicines the morning of surgery with A SIP OF WATER :                          You WILL NOT TAKE any Diabetes medication the day of surgery.    Do not wear jewelry - no rings  Do not wear lotions, colognes or deodorant.             Men may shave face and neck.  Do not bring valuables to the hospital.  [facility] is not responsible for any belongings or valuables.  Contacts, dentures or bridgework may not be worn into surgery.  Leave your suitcase in the car.  After surgery it may be brought to your room.  For patients admitted to the hospital, discharge time will be determined by your treatment team.  Please read over the following fact sheets that you were given. Pain Booklet, Coughing and Deep Breathing, MRSA Information and Surgical Site Infection Prevention      [place]- Preparing For Surgery  Before surgery, you can play an important role. Because skin is not sterile, your skin needs to be as free of germs as possible. You can reduce the number of germs on your skin by washing with CHG (chlorahexidine gluconate) Soap before surgery.  CHG is an antiseptic cleaner which kills germs and bonds with the skin to continue killing germs even after washing.     Oral Hygiene is also important to reduce your risk of infection.    Remember - BRUSH YOUR TEETH THE MORNING OF SURGERY WITH YOUR REGULAR TOOTHPASTE  Please do not use if you have an allergy to CHG or antibacterial soaps. If your skin becomes reddened/irritated stop using the CHG.  Do not shave (including legs and underarms) for at least 48 hours prior to first CHG shower. It is OK to shave your face.  Please follow these instructions carefully.   1. Shower the NIGHT BEFORE SURGERY and the MORNING OF SURGERY with CHG.   2. If you chose to wash your hair, wash your hair first as usual with your normal shampoo.  3. After you shampoo, rinse your hair and body thoroughly to remove the shampoo.  4. Use CHG as you would any other liquid soap. You can apply CHG directly to the skin and wash gently with a scrungie or a clean washcloth.   5. Apply the CHG Soap to your body ONLY FROM THE NECK  DOWN.  Do not use on open wounds or open sores. Avoid contact with your eyes, ears, mouth and genitals (private parts). Wash Face and genitals (private parts)  with your normal soap.  6. Wash thoroughly, paying special attention to the area where your surgery will be performed.  7. Thoroughly rinse your body with warm water from the neck down.  8. DO NOT shower/wash with your normal soap after using and rinsing off the CHG Soap.  9. Pat yourself dry with a CLEAN TOWEL.  10. Wear CLEAN PAJAMAS to bed the night before surgery, wear comfortable clothes the morning of surgery  11. Place CLEAN SHEETS on your bed the night of your first shower and DO NOT SLEEP WITH PETS.  Day of Surgery:  Do not apply any deodorants/lotions.  Please wear clean clothes to the hospital/surgery center.   Remember to brush your teeth WITH YOUR REGULAR TOOTHPASTE.        How to Manage Your Diabetes Before and After Surgery  Why is it important to control my blood sugar before and after surgery? . Improving blood  sugar levels before and after surgery helps healing and can limit problems. . A way of improving blood sugar control is eating a healthy diet by: o  Eating less sugar and carbohydrates o  Increasing activity/exercise o  Talking with your doctor about reaching your blood sugar goals . High blood sugars (greater than 180 mg/dL) can raise your risk of infections and slow your recovery, so you will need to focus on controlling your diabetes during the weeks before surgery.   How do I manage my blood sugar before surgery? . Check your blood sugar at least 4 times a day, starting 2 days before surgery, to make sure that the level is not too high or low. o Check your blood sugar the morning of your surgery when you wake up and every 2 hours until you get to the Short Stay unit. o  . If your blood sugar is less than 70 mg/dL, you will need to treat for low blood sugar: o Do not take insulin. o Treat a low blood sugar (less than 70 mg/dL) with  cup of clear juice (cranberry or apple), 4 glucose tablets, OR glucose gel. o NO ORANGE JUICE o  Recheck blood sugar in 15 minutes after treatment (to make sure it is greater than 70 mg/dL). If your blood sugar is not greater than 70 mg/dL on recheck, call [phone] o  for further instructions. . Report your blood sugar to the short stay nurse when you get to Short Stay.  . If you are admitted to the hospital after surgery: o Your blood sugar will be checked by the staff and you will probably be given insulin after surgery (instead of oral diabetes medicines) to make sure you have good blood sugar levels. o The goal for blood sugar control after surgery is 80-180 mg/dL.  . Do not take oral diabetes medicines (pills) the morning of surgery.

## 2018-03-14 ENCOUNTER — Ambulatory Visit (HOSPITAL_COMMUNITY)
Admission: RE | Admit: 2018-03-14 | Discharge: 2018-03-14 | Disposition: A | Discharge status: Home / Self Care | Payer: Medicare HMO | Source: Ambulatory Visit | Attending: Cardiothoracic Surgery | Admitting: Cardiothoracic Surgery

## 2018-03-14 ENCOUNTER — Ambulatory Visit (HOSPITAL_BASED_OUTPATIENT_CLINIC_OR_DEPARTMENT_OTHER)
Admission: RE | Admit: 2018-03-14 | Discharge: 2018-03-14 | Disposition: A | Discharge status: Home / Self Care | Payer: Medicare HMO | Source: Ambulatory Visit | Attending: Cardiothoracic Surgery | Admitting: Cardiothoracic Surgery

## 2018-03-14 ENCOUNTER — Encounter (HOSPITAL_COMMUNITY): Payer: Self-pay

## 2018-03-14 ENCOUNTER — Encounter: Payer: Self-pay | Admitting: *Deleted

## 2018-03-14 ENCOUNTER — Other Ambulatory Visit: Payer: Self-pay

## 2018-03-14 ENCOUNTER — Encounter (HOSPITAL_COMMUNITY)
Admission: RE | Admit: 2018-03-14 | Discharge: 2018-03-14 | Disposition: A | Discharge status: Home / Self Care | Payer: Medicare HMO | Source: Ambulatory Visit | Attending: Cardiothoracic Surgery | Admitting: Cardiothoracic Surgery

## 2018-03-14 ENCOUNTER — Encounter

## 2018-03-14 DIAGNOSIS — Z01810 Encounter for preprocedural cardiovascular examination: Secondary | ICD-10-CM | POA: Insufficient documentation

## 2018-03-14 DIAGNOSIS — I2510 Atherosclerotic heart disease of native coronary artery without angina pectoris: Secondary | ICD-10-CM

## 2018-03-14 DIAGNOSIS — I6523 Occlusion and stenosis of bilateral carotid arteries: Secondary | ICD-10-CM | POA: Insufficient documentation

## 2018-03-14 DIAGNOSIS — Z006 Encounter for examination for normal comparison and control in clinical research program: Secondary | ICD-10-CM

## 2018-03-14 HISTORY — DX: Dyspnea, unspecified: R06.00

## 2018-03-14 LAB — PULMONARY FUNCTION TEST
DL/VA % pred: 82 %
DL/VA: 3.82 ml/min/mmHg/L
DLCO unc % pred: 67 %
DLCO unc: 22.74 ml/min/mmHg
FEF 25-75 Post: 4.2 L/sec
FEF 25-75 Pre: 4.06 L/sec
FEF2575-%Change-Post: 3 %
FEF2575-%Pred-Post: 180 %
FEF2575-%Pred-Pre: 175 %
FEV1-%Change-Post: 0 %
FEV1-%Pred-Post: 107 %
FEV1-%Pred-Pre: 107 %
FEV1-Post: 3.44 L
FEV1-Pre: 3.42 L
FEV1FVC-%Change-Post: -1 %
FEV1FVC-%Pred-Pre: 118 %
FEV6-%Change-Post: 1 %
FEV6-%Pred-Post: 96 %
FEV6-%Pred-Pre: 94 %
FEV6-Post: 4.01 L
FEV6-Pre: 3.93 L
FEV6FVC-%Change-Post: 0 %
FEV6FVC-%Pred-Post: 105 %
FEV6FVC-%Pred-Pre: 105 %
FVC-%Change-Post: 1 %
FVC-%Pred-Post: 91 %
FVC-%Pred-Pre: 90 %
FVC-Post: 4.05 L
FVC-Pre: 3.98 L
Post FEV1/FVC ratio: 85 %
Post FEV6/FVC ratio: 99 %
Pre FEV1/FVC ratio: 86 %
Pre FEV6/FVC Ratio: 99 %
RV % pred: 81 %
RV: 2.13 L
TLC % pred: 88 %
TLC: 6.39 L

## 2018-03-14 LAB — BLOOD GAS, ARTERIAL
Acid-base deficit: 1.1 mmol/L (ref 0.0–2.0)
Bicarbonate: 22.8 mmol/L (ref 20.0–28.0)
Drawn by: 244901
FIO2: 21
O2 Saturation: 96.3 %
Patient temperature: 98.6
pCO2 arterial: 35.8 mmHg (ref 32.0–48.0)
pH, Arterial: 7.42 (ref 7.350–7.450)
pO2, Arterial: 89.9 mmHg (ref 83.0–108.0)

## 2018-03-14 LAB — CBC
HCT: 40.8 % (ref 39.0–52.0)
Hemoglobin: 12.9 g/dL — ABNORMAL LOW (ref 13.0–17.0)
MCH: 27.8 pg (ref 26.0–34.0)
MCHC: 31.6 g/dL (ref 30.0–36.0)
MCV: 87.9 fL (ref 80.0–100.0)
Platelets: 260 K/uL (ref 150–400)
RBC: 4.64 MIL/uL (ref 4.22–5.81)
RDW: 14.6 % (ref 11.5–15.5)
WBC: 11.5 K/uL — ABNORMAL HIGH (ref 4.0–10.5)
nRBC: 0 % (ref 0.0–0.2)

## 2018-03-14 LAB — COMPREHENSIVE METABOLIC PANEL
ALT: 49 U/L — ABNORMAL HIGH (ref 0–44)
AST: 36 U/L (ref 15–41)
Albumin: 4.3 g/dL (ref 3.5–5.0)
Alkaline Phosphatase: 45 U/L (ref 38–126)
Anion gap: 15 (ref 5–15)
BUN: 18 mg/dL (ref 8–23)
CO2: 20 mmol/L — ABNORMAL LOW (ref 22–32)
Calcium: 9.6 mg/dL (ref 8.9–10.3)
Chloride: 100 mmol/L (ref 98–111)
Creatinine, Ser: 1.42 mg/dL — ABNORMAL HIGH (ref 0.61–1.24)
GFR calc Af Amer: 54 mL/min — ABNORMAL LOW (ref >60)
GFR calc non Af Amer: 47 mL/min — ABNORMAL LOW (ref >60)
Glucose, Bld: 170 mg/dL — ABNORMAL HIGH (ref 70–99)
Potassium: 4 mmol/L (ref 3.5–5.1)
Sodium: 135 mmol/L (ref 135–145)
Total Bilirubin: 0.8 mg/dL (ref 0.3–1.2)
Total Protein: 7.4 g/dL (ref 6.5–8.1)

## 2018-03-14 LAB — URINALYSIS, ROUTINE W REFLEX MICROSCOPIC
Bilirubin Urine: NEGATIVE
Glucose, UA: NEGATIVE mg/dL
Hgb urine dipstick: NEGATIVE
Ketones, ur: NEGATIVE mg/dL
Leukocytes, UA: NEGATIVE
Nitrite: NEGATIVE
Protein, ur: NEGATIVE mg/dL
Specific Gravity, Urine: 1.011 (ref 1.005–1.030)
pH: 5 (ref 5.0–8.0)

## 2018-03-14 LAB — GLUCOSE, CAPILLARY: Glucose-Capillary: 159 mg/dL — ABNORMAL HIGH (ref 70–99)

## 2018-03-14 LAB — PROTIME-INR
INR: 0.96
Prothrombin Time: 12.7 seconds (ref 11.4–15.2)

## 2018-03-14 LAB — TYPE AND SCREEN
ABO/RH(D): O POS
Antibody Screen: NEGATIVE

## 2018-03-14 LAB — HEMOGLOBIN A1C
Hgb A1c MFr Bld: 7.1 % — ABNORMAL HIGH (ref 4.8–5.6)
Mean Plasma Glucose: 157.07 mg/dL

## 2018-03-14 LAB — APTT: aPTT: 32 seconds (ref 24–36)

## 2018-03-14 LAB — ABO/RH: ABO/RH(D): O POS

## 2018-03-14 LAB — SURGICAL PCR SCREEN
MRSA, PCR: NEGATIVE
Staphylococcus aureus: NEGATIVE

## 2018-03-14 MED ORDER — ALBUTEROL SULFATE (2.5 MG/3ML) 0.083% IN NEBU
2.5000 mg | Freq: Once | RESPIRATORY_TRACT | Status: AC
Start: 2018-03-14 — End: 2018-03-14
  Administered 2018-03-14: 2.5 mg via RESPIRATORY_TRACT

## 2018-03-14 NOTE — Research (Signed)
[facility] Research study protocol presented to patient. Patient states he has a lot on his plate not sure he wants to participate. ICF and Research office contact info given to patient. He briefly mentioned that he was awaken last night with a "tooth ache". He states he has never had a tooth ache. I informed him I would relay this to VidaRyan (CVTS office) and if he needs to do anything the office would let him know. Quesion encouraged and answered.

## 2018-03-14 NOTE — Progress Notes (Signed)
PCP: Dr. Kathrynn SpeedArvind  Cardiologist: Hanley Haysosario  DM: Type 2 Fasting sugars: 115-125  SA: yes, does not wear CPAP  Pt complains of a tooth ache in the back lower left.  States it has been throbbing and has progressively gotten worse over the past week.  Pt states he has not contacted his dentist.  Advised to call and get seen ASAP.  Pam spoke with Darl PikesSusan (@ Dr. Zenaida NieceVan Tright's office) and informed her of pt's status.  Per Darl PikesSusan, we continued pre-op appointment.  Will notify Dr. Morton PetersVan Tright.  Pt denies SOB, cough, fever, chest pain.  Pt stated understanding of instructions given for DOS.

## 2018-03-14 NOTE — Pre-Procedure Instructions (Signed)
Kizzie FantasiaLarry F Bradsher  03/14/2018      [facility] - [place], [place] - [address] [place] [id number] Phone: [phone] Fax: [phone]    Your procedure is scheduled on Tuesday, Nov. 19th   [facility] Admitting at 5:30 am             (posted surgery time 7:30a - 1:30p)   Call this number if you have problems the morning of surgery:  [phone]   Remember:   Do not eat any foods or drink any liquids after midnight, Monday.               Follow your surgeon's instructions on when to stop Asprin.  If no instructions were given by your surgeon then you will need to call the office to get those instructions.            7 days prior to surgery STOP taking any Aspirin(unless otherwise instructed by your surgeon), Aleve, Naproxen, Ibuprofen, Motrin, Advil, Goody's, BC's, all herbal medications, fish oil, and all vitamins            WHAT DO I DO ABOUT MY DIABETES MEDICATION?  Marland Kitchen. Do not take oral diabetes medicines (pills) the morning of surgery.   How to Manage Your Diabetes Before and After Surgery  Why is it important to control my blood sugar before and after surgery? . Improving blood sugar levels before and after surgery helps healing and can limit problems. . A way of improving blood sugar control is eating a healthy diet by: o  Eating less sugar and carbohydrates o  Increasing activity/exercise o  Talking with your doctor about reaching your blood sugar goals . High blood sugars (greater than 180 mg/dL) can raise your risk of infections and slow your recovery, so you will need to focus on controlling your diabetes during the weeks before surgery. . Make sure that the doctor who takes care of your diabetes knows about your planned surgery including the date and location.  How do I manage my blood sugar before surgery? . Check your blood sugar at least 4 times a day, starting 2 days before surgery, to make sure  that the level is not too high or low. o Check your blood sugar the morning of your surgery when you wake up and every 2 hours until you get to the Short Stay unit. . If your blood sugar is less than 70 mg/dL, you will need to treat for low blood sugar: o Do not take insulin. o Treat a low blood sugar (less than 70 mg/dL) with  cup of clear juice (cranberry or apple), 4 glucose tablets, OR glucose gel. o Recheck blood sugar in 15 minutes after treatment (to make sure it is greater than 70 mg/dL). If your blood sugar is not greater than 70 mg/dL on recheck, call [phone] for further instructions. . Report your blood sugar to the short stay nurse when you get to Short Stay.  . If you are admitted to the hospital after surgery: o Your blood sugar will be checked by the staff and you will probably be given insulin after surgery (instead of oral diabetes medicines) to make sure you have good blood sugar levels. o The goal for blood sugar control after surgery is 80-180 mg/dL.   [place]- Preparing For Surgery  Before surgery, you can play an important role. Because skin is not sterile, your skin needs  to be as free of germs as possible. You can reduce the number of germs on your skin by washing with CHG (chlorahexidine gluconate) Soap before surgery.  CHG is an antiseptic cleaner which kills germs and bonds with the skin to continue killing germs even after washing.    Oral Hygiene is also important to reduce your risk of infection.    Remember - BRUSH YOUR TEETH THE MORNING OF SURGERY WITH YOUR REGULAR TOOTHPASTE  Please do not use if you have an allergy to CHG or antibacterial soaps. If your skin becomes reddened/irritated stop using the CHG.  Do not shave (including legs and underarms) for at least 48 hours prior to first CHG shower. It is OK to shave your face.  Please follow these instructions carefully.   1. Shower the NIGHT BEFORE SURGERY and the MORNING OF SURGERY with CHG.    2. If you chose to wash your hair, wash your hair first as usual with your normal shampoo.  3. After you shampoo, rinse your hair and body thoroughly to remove the shampoo.  4. Use CHG as you would any other liquid soap. You can apply CHG directly to the skin and wash gently with a scrungie or a clean washcloth.   5. Apply the CHG Soap to your body ONLY FROM THE NECK DOWN.  Do not use on open wounds or open sores. Avoid contact with your eyes, ears, mouth and genitals (private parts). Wash Face and genitals (private parts)  with your normal soap.  6. Wash thoroughly, paying special attention to the area where your surgery will be performed.  7. Thoroughly rinse your body with warm water from the neck down.  8. DO NOT shower/wash with your normal soap after using and rinsing off the CHG Soap.  9. Pat yourself dry with a CLEAN TOWEL.  10. Wear CLEAN PAJAMAS to bed the night before surgery, wear comfortable clothes the morning of surgery  11. Place CLEAN SHEETS on your bed the night of your first shower and DO NOT SLEEP WITH PETS.  Day of Surgery:  Do not apply any deodorants/lotions.  Please wear clean clothes to the hospital/surgery center.   Remember to brush your teeth WITH YOUR REGULAR TOOTHPASTE.   Do not wear jewelry - no rings  Do not wear lotions, colognes or deodorant.             Men may shave face and neck.  Do not bring valuables to the hospital.  [facility] is not responsible for any belongings or valuables.  Contacts, dentures or bridgework may not be worn into surgery.  Leave your suitcase in the car.  After surgery it may be brought to your room.

## 2018-03-14 NOTE — Progress Notes (Signed)
Pre-op Cardiac Surgery  Carotid Findings:  Bilateral ICAs 1-39% stenosis. Bilateral vertebral arteries are patent with antegrade flow.  Upper Extremity Right Left  Brachial Pressures 166 161  Radial Waveforms Triphasic Biphasic  Ulnar Waveforms Biphasic Biphasic  Palmar Arch (Allen's Test) See below See below   Findings:   Right Upper Extremity: Doppler waveforms decrease >50% with right radial compression. Doppler waveforms decrease >50% with right ulnar compression.  Left Upper Extremity: Doppler waveforms remain within normal limits with left radial compression. Doppler waveforms remain within normal limits with left ulnar compression.   Lower  Extremity Right Left  Dorsalis Pedis 216 135  Posterior Tibial >254 201  Ankle/Brachial Indices >1.30 1.21   Findings:   Right ABI: Resting right ankle-brachial index indicates noncompressible right lower extremity arteries.  Left ABI: Resting left ankle-brachial index is within normal range. No evidence of significant left lower extremity arterial disease.  [name] H [name](RDMS RVT) 03/14/18 3:28 PM

## 2018-03-17 ENCOUNTER — Encounter

## 2018-03-17 ENCOUNTER — Telehealth: Payer: Self-pay | Admitting: *Deleted

## 2018-03-17 MED ORDER — SODIUM CHLORIDE 0.9 % IV SOLN
1.5000 g | INTRAVENOUS | Status: AC
Start: 2018-03-18 — End: 2018-03-19
  Filled 2018-03-17: qty 1.5

## 2018-03-17 MED ORDER — MILRINONE LACTATE IN DEXTROSE 20-5 MG/100ML-% IV SOLN
0.3000 ug/kg/min | INTRAVENOUS | Status: AC
Start: 2018-03-18 — End: 2018-03-19
  Filled 2018-03-17: qty 100

## 2018-03-17 MED ORDER — INSULIN REGULAR(HUMAN) IN NACL 100-0.9 UT/100ML-% IV SOLN
INTRAVENOUS | Status: AC
Start: 2018-03-18 — End: 2018-03-19
  Filled 2018-03-17: qty 100

## 2018-03-17 MED ORDER — DEXMEDETOMIDINE HCL IN NACL 400 MCG/100ML IV SOLN
0.1000 ug/kg/h | INTRAVENOUS | Status: AC
Start: 2018-03-18 — End: 2018-03-19
  Filled 2018-03-17: qty 100

## 2018-03-17 MED ORDER — PHENYLEPHRINE HCL-NACL 20-0.9 MG/250ML-% IV SOLN
30.0000 ug/min | INTRAVENOUS | Status: AC
Start: 2018-03-18 — End: 2018-03-19
  Filled 2018-03-17: qty 250

## 2018-03-17 MED ORDER — SODIUM CHLORIDE 0.9 % IV SOLN
750.0000 mg | INTRAVENOUS | Status: AC
Start: 2018-03-18 — End: 2018-03-19
  Filled 2018-03-17: qty 750

## 2018-03-17 MED ORDER — DEXTROSE 5 % IV SOLN
0.0000 ug/min | INTRAVENOUS | Status: AC
Start: 2018-03-18 — End: 2018-03-19
  Filled 2018-03-17: qty 4

## 2018-03-17 MED ORDER — VANCOMYCIN HCL 10 G IV SOLR
1500.0000 mg | INTRAVENOUS | Status: AC
Start: 2018-03-18 — End: 2018-03-19
  Filled 2018-03-17: qty 1500

## 2018-03-17 MED ORDER — NITROGLYCERIN IN D5W 200-5 MCG/ML-% IV SOLN
2.0000 ug/min | INTRAVENOUS | Status: AC
Start: 2018-03-18 — End: 2018-03-19
  Filled 2018-03-17: qty 250

## 2018-03-17 MED ORDER — HEPARIN SODIUM (PORCINE) 1000 UNIT/ML IJ SOLN
INTRAMUSCULAR | Status: AC
Start: 2018-03-18 — End: 2018-03-19
  Filled 2018-03-17: qty 2.5

## 2018-03-17 MED ORDER — DOPAMINE-DEXTROSE 3.2-5 MG/ML-% IV SOLN
0.0000 ug/kg/min | INTRAVENOUS | Status: AC
Start: 2018-03-18 — End: 2018-03-19

## 2018-03-17 MED ORDER — POTASSIUM CHLORIDE 2 MEQ/ML IV SOLN
80.0000 meq | INTRAVENOUS | Status: AC
Start: 2018-03-18 — End: 2018-03-19
  Filled 2018-03-17: qty 40

## 2018-03-17 MED ORDER — TRANEXAMIC ACID 1000 MG/10ML IV SOLN
1.5000 mg/kg/h | INTRAVENOUS | Status: AC
Start: 2018-03-18 — End: 2018-03-19
  Filled 2018-03-17: qty 25

## 2018-03-17 MED ORDER — TRANEXAMIC ACID (OHS) PUMP PRIME SOLUTION
2.0000 mg/kg | INTRAVENOUS | Status: AC
Start: 2018-03-18 — End: 2018-03-19
  Filled 2018-03-17: qty 2.48

## 2018-03-17 MED ORDER — TRANEXAMIC ACID (OHS) BOLUS VIA INFUSION
15.0000 mg/kg | INTRAVENOUS | Status: AC
Start: 2018-03-18 — End: 2018-03-19
  Filled 2018-03-17: qty 1857

## 2018-03-17 MED ORDER — SODIUM CHLORIDE 0.9 % IV SOLN
INTRAVENOUS | Status: AC
Start: 2018-03-18 — End: 2018-03-19
  Filled 2018-03-17: qty 30

## 2018-03-17 MED ORDER — MAGNESIUM SULFATE 50 % IJ SOLN
40.0000 meq | INTRAMUSCULAR | Status: AC
Start: 2018-03-18 — End: 2018-03-19
  Filled 2018-03-17: qty 9.85

## 2018-03-17 NOTE — Telephone Encounter (Signed)
[facility] Research study: Follow up telephone call to patient, left message for patient to contact research office if he would like to participate in the [facility].

## 2018-03-18 ENCOUNTER — Other Ambulatory Visit: Payer: Self-pay | Admitting: *Deleted

## 2018-03-18 ENCOUNTER — Encounter

## 2018-03-18 ENCOUNTER — Encounter: Payer: Self-pay | Admitting: *Deleted

## 2018-03-18 DIAGNOSIS — I2510 Atherosclerotic heart disease of native coronary artery without angina pectoris: Secondary | ICD-10-CM

## 2018-03-25 ENCOUNTER — Ambulatory Visit: Payer: Medicare HMO | Admitting: Cardiovascular Disease

## 2018-03-25 ENCOUNTER — Encounter

## 2018-03-26 ENCOUNTER — Encounter (HOSPITAL_COMMUNITY)
Admission: RE | Admit: 2018-03-26 | Discharge: 2018-03-26 | Disposition: A | Discharge status: Home / Self Care | Payer: Medicare HMO | Source: Ambulatory Visit | Attending: Cardiothoracic Surgery | Admitting: Cardiothoracic Surgery

## 2018-03-26 ENCOUNTER — Other Ambulatory Visit: Payer: Self-pay

## 2018-03-26 ENCOUNTER — Encounter (HOSPITAL_COMMUNITY): Payer: Self-pay

## 2018-03-26 ENCOUNTER — Encounter

## 2018-03-26 DIAGNOSIS — Z01818 Encounter for other preprocedural examination: Secondary | ICD-10-CM | POA: Insufficient documentation

## 2018-03-26 DIAGNOSIS — I2510 Atherosclerotic heart disease of native coronary artery without angina pectoris: Secondary | ICD-10-CM | POA: Diagnosis not present

## 2018-03-26 DIAGNOSIS — R9431 Abnormal electrocardiogram [ECG] [EKG]: Secondary | ICD-10-CM | POA: Insufficient documentation

## 2018-03-26 LAB — CBC
HCT: 38.4 % — ABNORMAL LOW (ref 39.0–52.0)
Hemoglobin: 11.9 g/dL — ABNORMAL LOW (ref 13.0–17.0)
MCH: 27.5 pg (ref 26.0–34.0)
MCHC: 31 g/dL (ref 30.0–36.0)
MCV: 88.7 fL (ref 80.0–100.0)
Platelets: 236 K/uL (ref 150–400)
RBC: 4.33 MIL/uL (ref 4.22–5.81)
RDW: 14.5 % (ref 11.5–15.5)
WBC: 8.6 K/uL (ref 4.0–10.5)
nRBC: 0 % (ref 0.0–0.2)

## 2018-03-26 LAB — BASIC METABOLIC PANEL
Anion gap: 11 (ref 5–15)
BUN: 12 mg/dL (ref 8–23)
CO2: 23 mmol/L (ref 22–32)
Calcium: 9.6 mg/dL (ref 8.9–10.3)
Chloride: 102 mmol/L (ref 98–111)
Creatinine, Ser: 1.17 mg/dL (ref 0.61–1.24)
GFR calc Af Amer: >60 mL/min (ref >60)
GFR calc non Af Amer: >60 mL/min (ref >60)
Glucose, Bld: 140 mg/dL — ABNORMAL HIGH (ref 70–99)
Potassium: 4.2 mmol/L (ref 3.5–5.1)
Sodium: 136 mmol/L (ref 135–145)

## 2018-03-26 LAB — PROTIME-INR
INR: 0.96
Prothrombin Time: 12.7 seconds (ref 11.4–15.2)

## 2018-03-26 LAB — GLUCOSE, CAPILLARY: Glucose-Capillary: 151 mg/dL — ABNORMAL HIGH (ref 70–99)

## 2018-03-26 LAB — APTT: aPTT: 31 seconds (ref 24–36)

## 2018-03-26 NOTE — Progress Notes (Signed)
Calvin FantasiaLarry F Price            03/13/2018                          [facility] - [place], [place] - [address] [place] [id number] Phone: [phone] Fax: [phone]              Your procedure is scheduled on Mon., Dec. 2, 2019            [facility] Admitting at 5:30AM             (posted surgery time 7:30a - 1:30p)             Call this number if you have problems the morning of surgery:            [phone]             Remember:             Do not eat any foods or drink any liquids after midnight on Dec. 1st                                   Take these medicines the morning of surgery with A SIP OF WATER :  AmLODipine (NORVASC), Finasteride (PROSCAR), and Loratadine (CLARITIN)   If needed: Acetaminophen (TYLENOL) and NitroGLYCERIN (NITROSTAT)   As of today, stop taking all Other Aspirin Products, Vitamins, Fish oils, and Herbal medications. Also stop all NSAIDS i.e. Advil, Ibuprofen, Motrin, Aleve, Anaprox, Naproxen, BC, Goody Powders, and all Supplements.              Do not take MetFORMIN (GLUCOPHAGE) the morning of surgery.       HOW TO MANAGE YOUR DIABETES BEFORE AND AFTER SURGERY  Why is it important to control my blood sugar before and after surgery?  Improving blood sugar levels before and after surgery helps healing and can limit problems.  A way of improving blood sugar control is eating a healthy diet by: ?  Eating less sugar and carbohydrates ?  Increasing activity/exercise ?  Talking with your doctor about reaching your blood sugar goals  High blood sugars (greater than 180 mg/dL) can raise your risk of infections and slow your recovery, so you will need to focus on controlling your diabetes during the weeks before surgery.  How do I manage my blood sugar before surgery?  Check your blood sugar at least 4 times a day, starting 2 days before surgery, to make sure that the level is not too  high or low. ? Check your blood sugar the morning of your surgery when you wake up and every 2 hours until you get to the Short Stay unit. ?   If your blood sugar is less than 70 mg/dL, you will need to treat for low blood sugar: ? Do not take insulin. ? Treat a low blood sugar (less than 70 mg/dL) with  cup of clear juice (cranberry or apple), 4glucose tablets, OR glucose gel. ? NO ORANGE JUICE ?  Recheck blood sugar in 15 minutes after treatment (to make sure it is greater than 70 mg/dL). If your blood sugar is not greater than 70 mg/dL on recheck, call [phone] ?  for further instructions.  Report your blood sugar to the short stay nurse when you get  to Short Stay.   If you are admitted to the hospital after surgery: ? Your blood sugar will be checked by the staff and you will probably be given insulin after surgery (instead of oral diabetes medicines) to make sure you have good blood sugar levels. ? The goal for blood sugar control after surgery is 80-180 mg/dL             Do not wear jewelry - no rings            Do not wear lotions, colognes or deodorant.             Men may shave face and neck.            Do not bring valuables to the hospital.            [facility] is not responsible for any belongings or valuables.  Contacts, dentures or bridgework may not be worn into surgery.  Leave your suitcase in the car.  After surgery it may be brought to your room.  For patients admitted to the hospital, discharge time will be determined by your treatment team.  [facility]- Preparing For Surgery  Before surgery, you can play an important role. Because skin is not sterile, your skin needs to be as free of germs as possible. You can reduce the number of germs on your skin by washing with CHG (chlorahexidine gluconate) Soap before surgery.  CHG is an antiseptic cleaner which kills germs and bonds with the skin to continue killing germs even after washing.    Oral Hygiene  is also important to reduce your risk of infection.    Remember - BRUSH YOUR TEETH THE MORNING OF SURGERY WITH YOUR REGULAR TOOTHPASTE  Please do not use if you have an allergy to CHG or antibacterial soaps. If your skin becomes reddened/irritated stop using the CHG.  Do not shave (including legs and underarms) for at least 48 hours prior to first CHG shower. It is OK to shave your face.  Please follow these instructions carefully.                                                                                                                     1. Shower the NIGHT BEFORE SURGERY and the MORNING OF SURGERY with CHG.   2. If you chose to wash your hair, wash your hair first as usual with your normal shampoo.  3. After you shampoo, rinse your hair and body thoroughly to remove the shampoo.  4. Use CHG as you would any other liquid soap. You can apply CHG directly to the skin and wash gently with a scrungie or a clean washcloth.   5. Apply the CHG Soap to your body ONLY FROM THE NECK DOWN.  Do not use on open wounds or open sores. Avoid contact with your eyes, ears, mouth and genitals (private parts). Wash Face and genitals (private parts)  with your normal soap.  6. Wash thoroughly, paying special  attention to the area where your surgery will be performed.  7. Thoroughly rinse your body with warm water from the neck down.  8. DO NOT shower/wash with your normal soap after using and rinsing off the CHG Soap.  9. Pat yourself dry with a CLEAN TOWEL.  10. Wear CLEAN PAJAMAS to bed the night before surgery, wear comfortable clothes the morning of surgery  11. Place CLEAN SHEETS on your bed the night of your first shower and DO NOT SLEEP WITH PETS.  Day of Surgery:  Do not apply any deodorants/lotions.  Please wear clean clothes to the hospital/surgery center.   Remember to brush your teeth WITH YOUR REGULAR TOOTHPASTE  Please read over the following fact sheets that you  were given. Pain Booklet, Coughing and Deep Breathing, MRSA Information and Surgical Site Infection Prevention     Revision History

## 2018-03-26 NOTE — Progress Notes (Signed)
PCP - Dr. Kathrynn SpeedArvind  Cardiologist - Dr. Hanley Haysosario  Chest x-ray - 03/14/18 (E)  EKG - 03/26/18 (E)  Stress Test - 02/13/18 (E)  ECHO - 02/21/18 (E)  Cardiac Cath - 02/27/18 (E)  AICD- na PM- na LOOP- na  Sleep Study - Yes- Positive CPAP - None  LABS- 03/26/18: CBC, BMP, PT, PTT, T/S  ASA- Continue  HA1C- 03/14/18: 7.1 (E) Fasting Blood Sugar - 115-125, today 151 Checks Blood Sugar ___0__ times a day- Pt has a meter and will check the morning before surgery.  Anesthesia- No  Pt denies having chest pain, sob, or fever at this time. All instructions explained to the pt, with a verbal understanding of the material. Pt agrees to go over the instructions while at home for a better understanding. The opportunity to ask questions was provided.

## 2018-03-30 MED ORDER — NITROGLYCERIN IN D5W 200-5 MCG/ML-% IV SOLN
2.0000 ug/min | INTRAVENOUS | Status: AC
Start: 2018-03-31 — End: 2018-03-31
  Administered 2018-03-31: 5 ug/min via INTRAVENOUS
  Filled 2018-03-30: qty 250

## 2018-03-30 MED ORDER — PHENYLEPHRINE HCL-NACL 20-0.9 MG/250ML-% IV SOLN
30.0000 ug/min | INTRAVENOUS | Status: AC
Start: 2018-03-31 — End: 2018-03-31
  Administered 2018-03-31: 50 ug/min via INTRAVENOUS
  Filled 2018-03-30: qty 250

## 2018-03-30 MED ORDER — EPINEPHRINE PF 1 MG/ML IJ SOLN
0.0000 ug/min | INTRAMUSCULAR | Status: DC
Start: 2018-03-31 — End: 2018-03-31
  Filled 2018-03-30: qty 4

## 2018-03-30 MED ORDER — TRANEXAMIC ACID (OHS) PUMP PRIME SOLUTION
2.0000 mg/kg | INTRAVENOUS | Status: DC
Start: 2018-03-31 — End: 2018-03-31
  Filled 2018-03-30: qty 2.52

## 2018-03-30 MED ORDER — DOPAMINE-DEXTROSE 3.2-5 MG/ML-% IV SOLN
0.0000 ug/kg/min | INTRAVENOUS | Status: DC
Start: 2018-03-31 — End: 2018-03-31
  Filled 2018-03-30: qty 250

## 2018-03-30 MED ORDER — SODIUM CHLORIDE 0.9 % IV SOLN
750.0000 mg | INTRAVENOUS | Status: DC
Start: 2018-03-31 — End: 2018-03-31
  Filled 2018-03-30: qty 750

## 2018-03-30 MED ORDER — MAGNESIUM SULFATE 50 % IJ SOLN
40.0000 meq | INTRAMUSCULAR | Status: DC
Start: 2018-03-31 — End: 2018-03-31
  Filled 2018-03-30: qty 9.85

## 2018-03-30 MED ORDER — VANCOMYCIN HCL 10 G IV SOLR
1250.0000 mg | INTRAVENOUS | Status: DC
Start: 2018-03-31 — End: 2018-03-30
  Filled 2018-03-30: qty 1250

## 2018-03-30 MED ORDER — MILRINONE LACTATE IN DEXTROSE 20-5 MG/100ML-% IV SOLN
0.3000 ug/kg/min | INTRAVENOUS | Status: AC
Start: 2018-03-31 — End: 2018-03-31
  Administered 2018-03-31: .25 ug/kg/min via INTRAVENOUS
  Filled 2018-03-30: qty 100

## 2018-03-30 MED ORDER — HEPARIN SODIUM (PORCINE) 1000 UNIT/ML IJ SOLN
INTRAMUSCULAR | Status: DC
Start: 2018-03-31 — End: 2018-03-31
  Filled 2018-03-30: qty 30

## 2018-03-30 MED ORDER — TRANEXAMIC ACID (OHS) BOLUS VIA INFUSION
15.0000 mg/kg | INTRAVENOUS | Status: DC
Start: 2018-03-31 — End: 2018-03-31
  Filled 2018-03-30: qty 1889

## 2018-03-30 MED ORDER — POTASSIUM CHLORIDE 2 MEQ/ML IV SOLN
80.0000 meq | INTRAVENOUS | Status: DC
Start: 2018-03-31 — End: 2018-03-31
  Filled 2018-03-30: qty 40

## 2018-03-30 MED ORDER — SODIUM CHLORIDE 0.9 % IV SOLN
1.5000 mg/kg/h | INTRAVENOUS | Status: AC
Start: 2018-03-31 — End: 2018-03-31
  Administered 2018-03-31: 11:00:00 via INTRAVENOUS
  Administered 2018-03-31: 15 mg/kg/h via INTRAVENOUS
  Filled 2018-03-30: qty 25

## 2018-03-30 MED ORDER — VANCOMYCIN HCL 10 G IV SOLR
1500.0000 mg | INTRAVENOUS | Status: AC
Start: 2018-03-31 — End: 2018-03-31
  Administered 2018-03-31: 1500 mg via INTRAVENOUS
  Filled 2018-03-30: qty 1500

## 2018-03-30 MED ORDER — INSULIN REGULAR(HUMAN) IN NACL 100-0.9 UT/100ML-% IV SOLN
INTRAVENOUS | Status: AC
Start: 2018-03-31 — End: 2018-03-31
  Administered 2018-03-31: 1.7 [IU]/h via INTRAVENOUS
  Filled 2018-03-30: qty 100

## 2018-03-30 MED ORDER — SODIUM CHLORIDE 0.9 % IV SOLN
1.5000 g | INTRAVENOUS | Status: AC
Start: 2018-03-31 — End: 2018-03-31
  Administered 2018-03-31: .75 g via INTRAVENOUS
  Administered 2018-03-31: 1.5 g via INTRAVENOUS
  Filled 2018-03-30: qty 1.5

## 2018-03-30 MED ORDER — HEPARIN SODIUM (PORCINE) 1000 UNIT/ML IJ SOLN
INTRAMUSCULAR | Status: DC
Start: 2018-03-31 — End: 2018-03-31
  Filled 2018-03-30: qty 2.5

## 2018-03-30 MED ORDER — DEXMEDETOMIDINE HCL IN NACL 400 MCG/100ML IV SOLN
0.1000 ug/kg/h | INTRAVENOUS | Status: AC
Start: 2018-03-31 — End: 2018-03-31
  Administered 2018-03-31: 11:00:00 via INTRAVENOUS
  Administered 2018-03-31: .2 ug/kg/h via INTRAVENOUS
  Filled 2018-03-30: qty 100

## 2018-03-30 NOTE — Anesthesia Preprocedure Evaluation (Addendum)
Anesthesia Evaluation  Patient identified by MRN, date of birth, ID band Patient awake    Reviewed: Allergy & Precautions, H&P , NPO status , Patient's Chart, lab work & pertinent test results  Airway Mallampati: III       Dental  (+) Teeth Intact, Dental Advidsory Given   Pulmonary shortness of breath and with exertion, former smoker,    breath sounds clear to auscultation- rhonchi       Cardiovascular hypertension, Pt. on medications + CAD   Rhythm:regular Rate:Normal  Echo 01/2018: Mild concentric LVH; EF 55-60% Moderately dilated LA RV normal size and function Normal RA Mild AV sclerosis Normal MV Normal TV Normal aorta     Neuro/Psych PSYCHIATRIC DISORDERS Anxiety negative neurological ROS     GI/Hepatic negative GI ROS, Neg liver ROS,   Endo/Other  diabetes, Type 2, Oral Hypoglycemic AgentsObesity   Renal/GU negative Renal ROS     Musculoskeletal negative musculoskeletal ROS (+)   Abdominal   Peds  Hematology  (+) Blood dyscrasia, anemia ,   Anesthesia Other Findings Day of surgery medications reviewed with the patient.  Reproductive/Obstetrics                           Anesthesia Physical Anesthesia Plan  ASA: IV  Anesthesia Plan: General   Post-op Pain Management:    Induction: Intravenous  PONV Risk Score and Plan: 2 and Treatment may vary due to age or medical condition and Midazolam  Airway Management Planned: Oral ETT  Additional Equipment: Arterial line, CVP, PA Cath, TEE and Ultrasound Guidance Line Placement  Intra-op Plan:   Post-operative Plan: Post-operative intubation/ventilation  Informed Consent: I have reviewed the patients History and Physical, chart, labs and discussed the procedure including the risks, benefits and alternatives for the proposed anesthesia with the patient or authorized representative who has indicated his/her understanding and  acceptance.   Dental Advisory Given and Dental advisory given  Plan Discussed with: CRNA  Anesthesia Plan Comments:      Anesthesia Quick Evaluation

## 2018-03-31 ENCOUNTER — Inpatient Hospital Stay (HOSPITAL_COMMUNITY)
Admission: RE | Admit: 2018-03-31 | Discharge: 2018-04-15 | DRG: 236 | Disposition: A | Discharge status: Skilled Nursing Facility | Payer: Medicare HMO | Attending: Cardiothoracic Surgery | Admitting: Cardiothoracic Surgery

## 2018-03-31 ENCOUNTER — Inpatient Hospital Stay (HOSPITAL_COMMUNITY): Payer: Medicare HMO

## 2018-03-31 ENCOUNTER — Encounter (HOSPITAL_COMMUNITY)
Admission: RE | Disposition: A | Discharge status: Skilled Nursing Facility | Payer: Self-pay | Source: Home / Self Care | Attending: Cardiothoracic Surgery

## 2018-03-31 ENCOUNTER — Encounter

## 2018-03-31 ENCOUNTER — Inpatient Hospital Stay (HOSPITAL_COMMUNITY): Payer: Medicare HMO | Admitting: Registered Nurse

## 2018-03-31 ENCOUNTER — Inpatient Hospital Stay (HOSPITAL_COMMUNITY): Payer: Medicare HMO | Admitting: Physician Assistant

## 2018-03-31 DIAGNOSIS — E785 Hyperlipidemia, unspecified: Secondary | ICD-10-CM | POA: Diagnosis present

## 2018-03-31 DIAGNOSIS — D62 Acute posthemorrhagic anemia: Secondary | ICD-10-CM | POA: Diagnosis not present

## 2018-03-31 DIAGNOSIS — Z87891 Personal history of nicotine dependence: Secondary | ICD-10-CM | POA: Diagnosis not present

## 2018-03-31 DIAGNOSIS — Z7982 Long term (current) use of aspirin: Secondary | ICD-10-CM

## 2018-03-31 DIAGNOSIS — Z888 Allergy status to other drugs, medicaments and biological substances status: Secondary | ICD-10-CM

## 2018-03-31 DIAGNOSIS — M109 Gout, unspecified: Secondary | ICD-10-CM | POA: Diagnosis present

## 2018-03-31 DIAGNOSIS — E119 Type 2 diabetes mellitus without complications: Secondary | ICD-10-CM | POA: Diagnosis present

## 2018-03-31 DIAGNOSIS — R339 Retention of urine, unspecified: Secondary | ICD-10-CM | POA: Diagnosis present

## 2018-03-31 DIAGNOSIS — I252 Old myocardial infarction: Secondary | ICD-10-CM | POA: Diagnosis not present

## 2018-03-31 DIAGNOSIS — Z79899 Other long term (current) drug therapy: Secondary | ICD-10-CM

## 2018-03-31 DIAGNOSIS — I97190 Other postprocedural cardiac functional disturbances following cardiac surgery: Secondary | ICD-10-CM | POA: Diagnosis present

## 2018-03-31 DIAGNOSIS — Z951 Presence of aortocoronary bypass graft: Secondary | ICD-10-CM

## 2018-03-31 DIAGNOSIS — Z8249 Family history of ischemic heart disease and other diseases of the circulatory system: Secondary | ICD-10-CM

## 2018-03-31 DIAGNOSIS — F419 Anxiety disorder, unspecified: Secondary | ICD-10-CM | POA: Diagnosis present

## 2018-03-31 DIAGNOSIS — J939 Pneumothorax, unspecified: Secondary | ICD-10-CM

## 2018-03-31 DIAGNOSIS — Z7984 Long term (current) use of oral hypoglycemic drugs: Secondary | ICD-10-CM

## 2018-03-31 DIAGNOSIS — I25110 Atherosclerotic heart disease of native coronary artery with unstable angina pectoris: Secondary | ICD-10-CM | POA: Diagnosis present

## 2018-03-31 DIAGNOSIS — Z823 Family history of stroke: Secondary | ICD-10-CM

## 2018-03-31 DIAGNOSIS — N179 Acute kidney failure, unspecified: Secondary | ICD-10-CM | POA: Diagnosis present

## 2018-03-31 DIAGNOSIS — J9811 Atelectasis: Secondary | ICD-10-CM

## 2018-03-31 DIAGNOSIS — I119 Hypertensive heart disease without heart failure: Secondary | ICD-10-CM | POA: Diagnosis present

## 2018-03-31 DIAGNOSIS — J90 Pleural effusion, not elsewhere classified: Secondary | ICD-10-CM

## 2018-03-31 DIAGNOSIS — Z6841 Body Mass Index (BMI) 40.0 and over, adult: Secondary | ICD-10-CM

## 2018-03-31 DIAGNOSIS — I2510 Atherosclerotic heart disease of native coronary artery without angina pectoris: Secondary | ICD-10-CM | POA: Diagnosis present

## 2018-03-31 HISTORY — PX: CORONARY ARTERY BYPASS GRAFT: SHX141

## 2018-03-31 HISTORY — PX: TEE WITHOUT CARDIOVERSION: SHX5443

## 2018-03-31 LAB — CBC
HCT: 26.4 % — ABNORMAL LOW (ref 39.0–52.0)
HCT: 25.8 % — ABNORMAL LOW (ref 39.0–52.0)
Hemoglobin: 8 g/dL — ABNORMAL LOW (ref 13.0–17.0)
Hemoglobin: 7.7 g/dL — ABNORMAL LOW (ref 13.0–17.0)
MCH: 27.4 pg (ref 26.0–34.0)
MCH: 26.9 pg (ref 26.0–34.0)
MCHC: 30.3 g/dL (ref 30.0–36.0)
MCHC: 29.8 g/dL — ABNORMAL LOW (ref 30.0–36.0)
MCV: 90.4 fL (ref 80.0–100.0)
MCV: 90.2 fL (ref 80.0–100.0)
Platelets: 198 K/uL (ref 150–400)
Platelets: 214 K/uL (ref 150–400)
RBC: 2.92 MIL/uL — AB (ref 4.22–5.81)
RBC: 2.86 MIL/uL — ABNORMAL LOW (ref 4.22–5.81)
RDW: 14.6 % (ref 11.5–15.5)
RDW: 14.4 % (ref 11.5–15.5)
WBC: 18.5 K/uL — ABNORMAL HIGH (ref 4.0–10.5)
WBC: 15.7 K/uL — ABNORMAL HIGH (ref 4.0–10.5)
nRBC: 0 % (ref 0.0–0.2)
nRBC: 0 % (ref 0.0–0.2)

## 2018-03-31 LAB — POCT I-STAT, CHEM 8
BUN: 15 mg/dL (ref 8–23)
BUN: 13 mg/dL (ref 8–23)
BUN: 14 mg/dL (ref 8–23)
BUN: 13 mg/dL (ref 8–23)
BUN: 12 mg/dL (ref 8–23)
BUN: 13 mg/dL (ref 8–23)
BUN: 16 mg/dL (ref 8–23)
Calcium, Ion: 1.24 mmol/L (ref 1.15–1.40)
Calcium, Ion: 1.08 mmol/L — ABNORMAL LOW (ref 1.15–1.40)
Calcium, Ion: 1.19 mmol/L (ref 1.15–1.40)
Calcium, Ion: 1.11 mmol/L — ABNORMAL LOW (ref 1.15–1.40)
Calcium, Ion: 1.08 mmol/L — ABNORMAL LOW (ref 1.15–1.40)
Calcium, Ion: 1.23 mmol/L (ref 1.15–1.40)
Calcium, Ion: 1.2 mmol/L (ref 1.15–1.40)
Chloride: 102 mmol/L (ref 98–111)
Chloride: 100 mmol/L (ref 98–111)
Chloride: 105 mmol/L (ref 98–111)
Chloride: 100 mmol/L (ref 98–111)
Chloride: 101 mmol/L (ref 98–111)
Chloride: 102 mmol/L (ref 98–111)
Chloride: 104 mmol/L (ref 98–111)
Creatinine, Ser: 1 mg/dL (ref 0.61–1.24)
Creatinine, Ser: 0.8 mg/dL (ref 0.61–1.24)
Creatinine, Ser: 0.9 mg/dL (ref 0.61–1.24)
Creatinine, Ser: 0.9 mg/dL (ref 0.61–1.24)
Creatinine, Ser: 0.9 mg/dL (ref 0.61–1.24)
Creatinine, Ser: 0.9 mg/dL (ref 0.61–1.24)
Creatinine, Ser: 1 mg/dL (ref 0.61–1.24)
Glucose, Bld: 145 mg/dL — ABNORMAL HIGH (ref 70–99)
Glucose, Bld: 144 mg/dL — ABNORMAL HIGH (ref 70–99)
Glucose, Bld: 149 mg/dL — ABNORMAL HIGH (ref 70–99)
Glucose, Bld: 172 mg/dL — ABNORMAL HIGH (ref 70–99)
Glucose, Bld: 180 mg/dL — ABNORMAL HIGH (ref 70–99)
Glucose, Bld: 164 mg/dL — ABNORMAL HIGH (ref 70–99)
Glucose, Bld: 197 mg/dL — ABNORMAL HIGH (ref 70–99)
HCT: 30 % — ABNORMAL LOW (ref 39.0–52.0)
HCT: 24 % — ABNORMAL LOW (ref 39.0–52.0)
HCT: 25 % — ABNORMAL LOW (ref 39.0–52.0)
HCT: 25 % — ABNORMAL LOW (ref 39.0–52.0)
HCT: 25 % — ABNORMAL LOW (ref 39.0–52.0)
HCT: 21 % — ABNORMAL LOW (ref 39.0–52.0)
HCT: 22 % — ABNORMAL LOW (ref 39.0–52.0)
Hemoglobin: 10.2 g/dL — ABNORMAL LOW (ref 13.0–17.0)
Hemoglobin: 8.2 g/dL — ABNORMAL LOW (ref 13.0–17.0)
Hemoglobin: 8.5 g/dL — ABNORMAL LOW (ref 13.0–17.0)
Hemoglobin: 8.5 g/dL — ABNORMAL LOW (ref 13.0–17.0)
Hemoglobin: 8.5 g/dL — ABNORMAL LOW (ref 13.0–17.0)
Hemoglobin: 7.1 g/dL — ABNORMAL LOW (ref 13.0–17.0)
Hemoglobin: 7.5 g/dL — ABNORMAL LOW (ref 13.0–17.0)
Potassium: 4.1 mmol/L (ref 3.5–5.1)
Potassium: 4.3 mmol/L (ref 3.5–5.1)
Potassium: 4.4 mmol/L (ref 3.5–5.1)
Potassium: 4.9 mmol/L (ref 3.5–5.1)
Potassium: 4.8 mmol/L (ref 3.5–5.1)
Potassium: 4.4 mmol/L (ref 3.5–5.1)
Potassium: 4.5 mmol/L (ref 3.5–5.1)
Sodium: 138 mmol/L (ref 135–145)
Sodium: 136 mmol/L (ref 135–145)
Sodium: 138 mmol/L (ref 135–145)
Sodium: 135 mmol/L (ref 135–145)
Sodium: 134 mmol/L — ABNORMAL LOW (ref 135–145)
Sodium: 136 mmol/L (ref 135–145)
Sodium: 137 mmol/L (ref 135–145)
TCO2: 28 mmol/L (ref 22–32)
TCO2: 26 mmol/L (ref 22–32)
TCO2: 28 mmol/L (ref 22–32)
TCO2: 27 mmol/L (ref 22–32)
TCO2: 26 mmol/L (ref 22–32)
TCO2: 23 mmol/L (ref 22–32)
TCO2: 24 mmol/L (ref 22–32)

## 2018-03-31 LAB — POCT I-STAT 3, ART BLOOD GAS (G3+)
Acid-Base Excess: 1 mmol/L (ref 0.0–2.0)
Acid-base deficit: 3 mmol/L — ABNORMAL HIGH (ref 0.0–2.0)
Acid-base deficit: 3 mmol/L — ABNORMAL HIGH (ref 0.0–2.0)
Acid-base deficit: 1 mmol/L (ref 0.0–2.0)
Acid-base deficit: 1 mmol/L (ref 0.0–2.0)
Acid-base deficit: 1 mmol/L (ref 0.0–2.0)
Bicarbonate: 26.7 mmol/L (ref 20.0–28.0)
Bicarbonate: 22 mmol/L (ref 20.0–28.0)
Bicarbonate: 22.2 mmol/L (ref 20.0–28.0)
Bicarbonate: 24.9 mmol/L (ref 20.0–28.0)
Bicarbonate: 24.9 mmol/L (ref 20.0–28.0)
Bicarbonate: 24 mmol/L (ref 20.0–28.0)
O2 Saturation: 100 %
O2 Saturation: 92 %
O2 Saturation: 97 %
O2 Saturation: 92 %
O2 Saturation: 95 %
O2 Saturation: 97 %
Patient temperature: 36.1
Patient temperature: 36.1
Patient temperature: 36.8
TCO2: 28 mmol/L (ref 22–32)
TCO2: 23 mmol/L (ref 22–32)
TCO2: 23 mmol/L (ref 22–32)
TCO2: 26 mmol/L (ref 22–32)
TCO2: 26 mmol/L (ref 22–32)
TCO2: 25 mmol/L (ref 22–32)
pCO2 arterial: 44.8 mmHg (ref 32.0–48.0)
pCO2 arterial: 35.5 mmHg (ref 32.0–48.0)
pCO2 arterial: 37.7 mmHg (ref 32.0–48.0)
pCO2 arterial: 47.7 mmHg (ref 32.0–48.0)
pCO2 arterial: 42.8 mmHg (ref 32.0–48.0)
pCO2 arterial: 41.5 mmHg (ref 32.0–48.0)
pH, Arterial: 7.383 (ref 7.350–7.450)
pH, Arterial: 7.378 (ref 7.350–7.450)
pH, Arterial: 7.399 (ref 7.350–7.450)
pH, Arterial: 7.321 — ABNORMAL LOW (ref 7.350–7.450)
pH, Arterial: 7.369 (ref 7.350–7.450)
pH, Arterial: 7.369 (ref 7.350–7.450)
pO2, Arterial: 270 mmHg — ABNORMAL HIGH (ref 83.0–108.0)
pO2, Arterial: 63 mmHg — ABNORMAL LOW (ref 83.0–108.0)
pO2, Arterial: 92 mmHg (ref 83.0–108.0)
pO2, Arterial: 67 mmHg — ABNORMAL LOW (ref 83.0–108.0)
pO2, Arterial: 75 mmHg — ABNORMAL LOW (ref 83.0–108.0)
pO2, Arterial: 97 mmHg (ref 83.0–108.0)

## 2018-03-31 LAB — POCT I-STAT 4, (NA,K, GLUC, HGB,HCT)
Glucose, Bld: 177 mg/dL — ABNORMAL HIGH (ref 70–99)
HCT: 26 % — ABNORMAL LOW (ref 39.0–52.0)
Hemoglobin: 8.8 g/dL — ABNORMAL LOW (ref 13.0–17.0)
Potassium: 4.3 mmol/L (ref 3.5–5.1)
Sodium: 138 mmol/L (ref 135–145)

## 2018-03-31 LAB — CREATININE, SERUM
Creatinine, Ser: 1.21 mg/dL (ref 0.61–1.24)
GFR calc Af Amer: >60 mL/min (ref >60)
GFR calc non Af Amer: 58 mL/min — ABNORMAL LOW (ref >60)

## 2018-03-31 LAB — HEMOGLOBIN AND HEMATOCRIT, BLOOD
HCT: 23.9 % — ABNORMAL LOW (ref 39.0–52.0)
Hemoglobin: 7.4 g/dL — ABNORMAL LOW (ref 13.0–17.0)

## 2018-03-31 LAB — GLUCOSE, CAPILLARY
Glucose-Capillary: 133 mg/dL — ABNORMAL HIGH (ref 70–99)
Glucose-Capillary: 147 mg/dL — ABNORMAL HIGH (ref 70–99)
Glucose-Capillary: 154 mg/dL — ABNORMAL HIGH (ref 70–99)
Glucose-Capillary: 167 mg/dL — ABNORMAL HIGH (ref 70–99)
Glucose-Capillary: 169 mg/dL — ABNORMAL HIGH (ref 70–99)
Glucose-Capillary: 170 mg/dL — ABNORMAL HIGH (ref 70–99)
Glucose-Capillary: 171 mg/dL — ABNORMAL HIGH (ref 70–99)
Glucose-Capillary: 183 mg/dL — ABNORMAL HIGH (ref 70–99)
Glucose-Capillary: 190 mg/dL — ABNORMAL HIGH (ref 70–99)

## 2018-03-31 LAB — APTT: aPTT: 29 seconds (ref 24–36)

## 2018-03-31 LAB — PREPARE RBC (CROSSMATCH)

## 2018-03-31 LAB — PLATELET COUNT: Platelets: 131 K/uL — ABNORMAL LOW (ref 150–400)

## 2018-03-31 LAB — PROTIME-INR
INR: 1.37
Prothrombin Time: 16.7 seconds — ABNORMAL HIGH (ref 11.4–15.2)

## 2018-03-31 LAB — MAGNESIUM: Magnesium: 2.8 mg/dL — ABNORMAL HIGH (ref 1.7–2.4)

## 2018-03-31 SURGERY — CORONARY ARTERY BYPASS GRAFTING (CABG)
Anesthesia: General | Site: Chest

## 2018-03-31 MED ORDER — MIDAZOLAM HCL 5 MG/5ML IJ SOLN
INTRAMUSCULAR | Status: DC | PRN
Start: 2018-03-31 — End: 2018-03-31
  Administered 2018-03-31 (×5): 2 mg via INTRAVENOUS

## 2018-03-31 MED ORDER — PAPAVERINE HCL 30 MG/ML IJ SOLN
INTRAMUSCULAR | Status: AC
Start: 2018-03-31 — End: 2018-03-31
  Administered 2018-03-31: 500 mL
  Filled 2018-03-31: qty 2.5

## 2018-03-31 MED ORDER — DEXMEDETOMIDINE HCL IN NACL 200 MCG/50ML IV SOLN
0.0000 ug/kg/h | INTRAVENOUS | Status: DC
Start: 2018-03-31 — End: 2018-04-01
  Administered 2018-03-31: 0.6 ug/kg/h via INTRAVENOUS
  Administered 2018-03-31: 0.7 ug/kg/h via INTRAVENOUS
  Administered 2018-03-31: 0.6 ug/kg/h via INTRAVENOUS
  Administered 2018-04-01: 0.5 ug/kg/h via INTRAVENOUS
  Administered 2018-04-01: 0.6 ug/kg/h via INTRAVENOUS
  Administered 2018-04-01: 0.5 ug/kg/h via INTRAVENOUS
  Filled 2018-03-31: qty 50
  Filled 2018-03-31 (×2): qty 100
  Filled 2018-03-31: qty 50

## 2018-03-31 MED ORDER — PROPOFOL 10 MG/ML IV BOLUS
INTRAVENOUS | Status: DC | PRN
Start: 2018-03-31 — End: 2018-03-31
  Administered 2018-03-31: 70 mg via INTRAVENOUS

## 2018-03-31 MED ORDER — FAMOTIDINE IN NACL 20-0.9 MG/50ML-% IV SOLN
20.0000 mg | Freq: Two times a day (BID) | INTRAVENOUS | Status: AC
Start: 2018-03-31 — End: 2018-03-31
  Administered 2018-03-31 (×2): 20 mg via INTRAVENOUS
  Filled 2018-03-31: qty 50

## 2018-03-31 MED ORDER — CHLORHEXIDINE GLUCONATE 0.12 % MT SOLN
15.0000 mL | Freq: Once | OROMUCOSAL | Status: DC
Start: 2018-04-01 — End: 2018-03-31
  Filled 2018-03-31: qty 15

## 2018-03-31 MED ORDER — ASPIRIN EC 325 MG PO TBEC
325.0000 mg | Freq: Every day | ORAL | Status: DC
Start: 2018-04-01 — End: 2018-04-15
  Administered 2018-04-01 – 2018-04-15 (×14): 325 mg via ORAL
  Filled 2018-03-31 (×15): qty 1

## 2018-03-31 MED ORDER — PHENYLEPHRINE HCL-NACL 20-0.9 MG/250ML-% IV SOLN
0.0000 ug/min | INTRAVENOUS | Status: DC
Start: 2018-03-31 — End: 2018-03-31
  Filled 2018-03-31: qty 250

## 2018-03-31 MED ORDER — ROCURONIUM BROMIDE 100 MG/10ML IV SOLN
INTRAVENOUS | Status: DC | PRN
Start: 2018-03-31 — End: 2018-03-31
  Administered 2018-03-31: 100 mg via INTRAVENOUS

## 2018-03-31 MED ORDER — DOPAMINE-DEXTROSE 3.2-5 MG/ML-% IV SOLN
2.5000 ug/kg/min | INTRAVENOUS | Status: DC
Start: 2018-03-31 — End: 2018-04-01
  Administered 2018-03-31: 3 ug/kg/min via INTRAVENOUS

## 2018-03-31 MED ORDER — ASPIRIN 81 MG PO CHEW
324.0000 mg | Freq: Every day | ORAL | Status: DC
Start: 2018-04-01 — End: 2018-04-15
  Administered 2018-04-12: 324 mg
  Filled 2018-03-31 (×2): qty 4

## 2018-03-31 MED ORDER — VANCOMYCIN HCL IN DEXTROSE 1-5 GM/200ML-% IV SOLN
1000.0000 mg | Freq: Once | INTRAVENOUS | Status: AC
Start: 2018-03-31 — End: 2018-03-31
  Administered 2018-03-31: 1000 mg via INTRAVENOUS
  Filled 2018-03-31: qty 200

## 2018-03-31 MED ORDER — DESMOPRESSIN ACETATE 4 MCG/ML IJ SOLN
20.0000 ug | INTRAMUSCULAR | Status: AC
Start: 2018-03-31 — End: 2018-03-31
  Administered 2018-03-31: 20 ug via INTRAVENOUS
  Filled 2018-03-31: qty 5

## 2018-03-31 MED ORDER — PRAVASTATIN SODIUM 40 MG PO TABS
40.0000 mg | Freq: Every day | ORAL | Status: DC
Start: 2018-03-31 — End: 2018-04-15
  Administered 2018-04-01 – 2018-04-14 (×14): 40 mg via ORAL
  Filled 2018-03-31 (×14): qty 1

## 2018-03-31 MED ORDER — PROTAMINE SULFATE 10 MG/ML IV SOLN
INTRAVENOUS | Status: AC
Start: 2018-03-31 — End: ?
  Filled 2018-03-31: qty 50

## 2018-03-31 MED ORDER — SODIUM CHLORIDE 0.9% FLUSH
3.0000 mL | Freq: Two times a day (BID) | INTRAVENOUS | Status: DC
Start: 2018-04-01 — End: 2018-04-15
  Administered 2018-04-01 – 2018-04-14 (×22): 3 mL via INTRAVENOUS

## 2018-03-31 MED ORDER — ALBUMIN HUMAN 5 % IV SOLN
INTRAVENOUS | Status: DC | PRN
Start: 2018-03-31 — End: 2018-03-31
  Administered 2018-03-31 (×2): via INTRAVENOUS

## 2018-03-31 MED ORDER — TRANEXAMIC ACID 1000 MG/10ML IV SOLN
1.5000 mg/kg/h | INTRAVENOUS | Status: DC
Start: 2018-03-31 — End: 2018-03-31
  Filled 2018-03-31: qty 25

## 2018-03-31 MED ORDER — SODIUM CHLORIDE 0.9 % IV SOLN
INTRAVENOUS | Status: DC | PRN
Start: 2018-03-31 — End: 2018-03-31
  Administered 2018-03-31: 80 ug via INTRAVENOUS
  Administered 2018-03-31 (×2): 40 ug via INTRAVENOUS
  Administered 2018-03-31: 80 ug via INTRAVENOUS
  Administered 2018-03-31: 40 ug via INTRAVENOUS

## 2018-03-31 MED ORDER — LACTATED RINGERS IV SOLN
INTRAVENOUS | Status: DC | PRN
Start: 2018-03-31 — End: 2018-03-31
  Administered 2018-03-31: 07:00:00 via INTRAVENOUS

## 2018-03-31 MED ORDER — ALBUMIN HUMAN 5 % IV SOLN
250.0000 mL | INTRAVENOUS | Status: AC | PRN
Start: 2018-03-31 — End: 2018-04-01
  Administered 2018-03-31 – 2018-04-01 (×4): 12.5 g via INTRAVENOUS
  Filled 2018-03-31 (×2): qty 250

## 2018-03-31 MED ORDER — CALCIUM CHLORIDE 10 % IV SOLN
INTRAVENOUS | Status: DC | PRN
Start: 2018-03-31 — End: 2018-03-31
  Administered 2018-03-31: 100 mg via INTRAVENOUS
  Administered 2018-03-31: 300 mg via INTRAVENOUS
  Administered 2018-03-31: 100 mg via INTRAVENOUS
  Administered 2018-03-31: 300 mg via INTRAVENOUS

## 2018-03-31 MED ORDER — ONDANSETRON HCL 4 MG/2ML IJ SOLN
INTRAMUSCULAR | Status: AC
Start: 2018-03-31 — End: ?
  Filled 2018-03-31: qty 2

## 2018-03-31 MED ORDER — ROCURONIUM BROMIDE 50 MG/5ML IV SOSY
INTRAVENOUS | Status: AC
Start: 2018-03-31 — End: ?
  Filled 2018-03-31: qty 5

## 2018-03-31 MED ORDER — INSULIN REGULAR(HUMAN) IN NACL 100-0.9 UT/100ML-% IV SOLN
INTRAVENOUS | Status: DC
Start: 2018-03-31 — End: 2018-04-02
  Administered 2018-03-31: 15.1 [IU]/h via INTRAVENOUS
  Administered 2018-04-01: 6.4 [IU]/h via INTRAVENOUS
  Filled 2018-03-31 (×2): qty 100

## 2018-03-31 MED ORDER — LACTATED RINGERS IV SOLN
INTRAVENOUS | Status: DC | PRN
Start: 2018-03-31 — End: 2018-03-31
  Administered 2018-03-31 (×2): via INTRAVENOUS

## 2018-03-31 MED ORDER — CHLORHEXIDINE GLUCONATE 0.12% ORAL RINSE (MEDLINE KIT)
15.0000 mL | Freq: Two times a day (BID) | OROMUCOSAL | Status: DC
Start: 2018-03-31 — End: 2018-04-01
  Administered 2018-03-31 – 2018-04-01 (×2): 15 mL via OROMUCOSAL

## 2018-03-31 MED ORDER — FENTANYL CITRATE (PF) 250 MCG/5ML IJ SOLN
INTRAMUSCULAR | Status: DC | PRN
Start: 2018-03-31 — End: 2018-03-31
  Administered 2018-03-31: 100 ug via INTRAVENOUS
  Administered 2018-03-31 (×3): 50 ug via INTRAVENOUS
  Administered 2018-03-31: 250 ug via INTRAVENOUS
  Administered 2018-03-31: 50 ug via INTRAVENOUS
  Administered 2018-03-31: 300 ug via INTRAVENOUS
  Administered 2018-03-31: 100 ug via INTRAVENOUS
  Administered 2018-03-31 (×2): 50 ug via INTRAVENOUS
  Administered 2018-03-31: 250 ug via INTRAVENOUS
  Administered 2018-03-31: 50 ug via INTRAVENOUS

## 2018-03-31 MED ORDER — NITROGLYCERIN IN D5W 200-5 MCG/ML-% IV SOLN
0.0000 ug/min | INTRAVENOUS | Status: DC
Start: 2018-03-31 — End: 2018-04-02

## 2018-03-31 MED ORDER — MIDAZOLAM HCL 2 MG/2ML IJ SOLN
2.0000 mg | INTRAMUSCULAR | Status: DC | PRN
Start: 2018-03-31 — End: 2018-04-01

## 2018-03-31 MED ORDER — TRAMADOL HCL 50 MG PO TABS
50.0000 mg | ORAL | Status: DC | PRN
Start: 2018-03-31 — End: 2018-04-02
  Administered 2018-04-01 – 2018-04-02 (×2): 50 mg via ORAL
  Filled 2018-03-31 (×2): qty 1

## 2018-03-31 MED ORDER — HEPARIN SODIUM (PORCINE) 1000 UNIT/ML IJ SOLN
INTRAMUSCULAR | Status: AC
Start: 2018-03-31 — End: ?
  Filled 2018-03-31: qty 1

## 2018-03-31 MED ORDER — HEPARIN SODIUM (PORCINE) 1000 UNIT/ML IJ SOLN
INTRAMUSCULAR | Status: DC | PRN
Start: 2018-03-31 — End: 2018-03-31
  Administered 2018-03-31: 2000 [IU] via INTRAVENOUS
  Administered 2018-03-31: 34000 [IU] via INTRAVENOUS
  Administered 2018-03-31: 2000 [IU] via INTRAVENOUS

## 2018-03-31 MED ORDER — MILRINONE LACTATE IN DEXTROSE 20-5 MG/100ML-% IV SOLN
0.1250 ug/kg/min | INTRAVENOUS | Status: DC
Start: 2018-03-31 — End: 2018-04-02
  Administered 2018-03-31 – 2018-04-01 (×2): 0.125 ug/kg/min via INTRAVENOUS
  Administered 2018-04-02: 0.3 ug/kg/min via INTRAVENOUS
  Filled 2018-03-31 (×3): qty 100

## 2018-03-31 MED ORDER — SODIUM CHLORIDE 0.9 % IV SOLN
INTRAVENOUS | Status: DC
Start: 2018-03-31 — End: 2018-04-05
  Administered 2018-03-31 – 2018-04-04 (×2): via INTRAVENOUS

## 2018-03-31 MED ORDER — OXYCODONE HCL 5 MG PO TABS
5.0000 mg | ORAL | Status: DC | PRN
Start: 2018-03-31 — End: 2018-04-01
  Administered 2018-04-01 (×2): 5 mg via ORAL
  Filled 2018-03-31 (×2): qty 1

## 2018-03-31 MED ORDER — PAPAVERINE HCL 30 MG/ML IJ SOLN
INTRAMUSCULAR | Status: DC | PRN
Start: 2018-03-31 — End: 2018-03-31
  Administered 2018-03-31: 500 mL

## 2018-03-31 MED ORDER — LACTATED RINGERS IV SOLN
500.0000 mL | Freq: Once | INTRAVENOUS | Status: AC | PRN
Start: 2018-03-31 — End: 2018-03-31
  Administered 2018-03-31: 500 mL via INTRAVENOUS

## 2018-03-31 MED ORDER — DOCUSATE SODIUM 100 MG PO CAPS
200.0000 mg | Freq: Every day | ORAL | Status: DC
Start: 2018-04-01 — End: 2018-04-15
  Administered 2018-04-01 – 2018-04-15 (×12): 200 mg via ORAL
  Filled 2018-03-31 (×15): qty 2

## 2018-03-31 MED ORDER — NOREPINEPHRINE 16 MG/250ML-% IV SOLN
2.0000 ug/min | INTRAVENOUS | Status: DC
Start: 2018-03-31 — End: 2018-04-02
  Administered 2018-04-01: 4 ug/min via INTRAVENOUS
  Filled 2018-03-31: qty 250

## 2018-03-31 MED ORDER — LORATADINE 10 MG PO TABS
10.0000 mg | Freq: Every day | ORAL | Status: DC
Start: 2018-04-01 — End: 2018-04-15
  Administered 2018-04-01 – 2018-04-15 (×15): 10 mg via ORAL
  Filled 2018-03-31 (×15): qty 1

## 2018-03-31 MED ORDER — ACETAMINOPHEN 160 MG/5ML PO SOLN
650.0000 mg | Freq: Once | ORAL | Status: AC
Start: 2018-03-31 — End: 2018-03-31

## 2018-03-31 MED ORDER — PANTOPRAZOLE SODIUM 40 MG PO TBEC
40.0000 mg | Freq: Every day | ORAL | Status: DC
Start: 2018-04-02 — End: 2018-04-15
  Administered 2018-04-02 – 2018-04-15 (×14): 40 mg via ORAL
  Filled 2018-03-31 (×14): qty 1

## 2018-03-31 MED ORDER — METOPROLOL TARTRATE 25 MG/10 ML ORAL SUSPENSION
12.5000 mg | Freq: Two times a day (BID) | ORAL | Status: DC
Start: 2018-03-31 — End: 2018-04-02

## 2018-03-31 MED ORDER — ARTIFICIAL TEARS OPHTHALMIC OINT
OPHTHALMIC | Status: DC | PRN
Start: 2018-03-31 — End: 2018-03-31
  Administered 2018-03-31: 1 via OPHTHALMIC

## 2018-03-31 MED ORDER — BISACODYL 5 MG PO TBEC
10.0000 mg | Freq: Every day | ORAL | Status: DC
Start: 2018-04-01 — End: 2018-04-15
  Administered 2018-04-01 – 2018-04-15 (×11): 10 mg via ORAL
  Filled 2018-03-31 (×15): qty 2

## 2018-03-31 MED ORDER — POTASSIUM CHLORIDE 10 MEQ/50ML IV SOLN
10.0000 meq | INTRAVENOUS | Status: AC
Start: 2018-03-31 — End: 2018-03-31

## 2018-03-31 MED ORDER — ORAL CARE MOUTH RINSE
15.0000 mL | OROMUCOSAL | Status: DC
Start: 2018-03-31 — End: 2018-04-01
  Administered 2018-03-31 – 2018-04-01 (×7): 15 mL via OROMUCOSAL

## 2018-03-31 MED ORDER — ROCURONIUM BROMIDE 50 MG/5ML IV SOSY
INTRAVENOUS | Status: AC
Start: 2018-03-31 — End: ?
  Filled 2018-03-31: qty 10

## 2018-03-31 MED ORDER — ADULT MULTIVITAMIN W/MINERALS CH
1.0000 | Freq: Every day | ORAL | Status: DC
Start: 2018-04-01 — End: 2018-04-01

## 2018-03-31 MED ORDER — SODIUM CHLORIDE 0.45 % IV SOLN
INTRAVENOUS | Status: DC | PRN
Start: 2018-03-31 — End: 2018-04-05
  Administered 2018-03-31: 15:00:00 via INTRAVENOUS

## 2018-03-31 MED ORDER — PHENYLEPHRINE 40 MCG/ML (10ML) SYRINGE FOR IV PUSH (FOR BLOOD PRESSURE SUPPORT)
INTRAVENOUS | Status: AC
Start: 2018-03-31 — End: ?
  Filled 2018-03-31: qty 10

## 2018-03-31 MED ORDER — VECURONIUM BROMIDE 10 MG IV SOLR
INTRAVENOUS | Status: AC
Start: 2018-03-31 — End: ?
  Filled 2018-03-31: qty 10

## 2018-03-31 MED ORDER — SODIUM CHLORIDE 0.9 % IV SOLN
1.5000 g | Freq: Two times a day (BID) | INTRAVENOUS | Status: AC
Start: 2018-03-31 — End: 2018-04-02
  Administered 2018-03-31 – 2018-04-02 (×4): 1.5 g via INTRAVENOUS
  Filled 2018-03-31 (×4): qty 1.5

## 2018-03-31 MED ORDER — CHLORHEXIDINE GLUCONATE CLOTH 2 % EX PADS
6.0000 | Freq: Every day | CUTANEOUS | Status: DC
Start: 2018-03-31 — End: 2018-04-08
  Administered 2018-03-31 – 2018-04-08 (×9): 6 via TOPICAL

## 2018-03-31 MED ORDER — SODIUM CHLORIDE 0.9% FLUSH
3.0000 mL | INTRAVENOUS | Status: DC | PRN
Start: 2018-04-01 — End: 2018-04-15

## 2018-03-31 MED ORDER — MIDAZOLAM HCL (PF) 10 MG/2ML IJ SOLN
INTRAMUSCULAR | Status: AC
Start: 2018-03-31 — End: ?
  Filled 2018-03-31: qty 2

## 2018-03-31 MED ORDER — LACTATED RINGERS IV SOLN
INTRAVENOUS | Status: DC
Start: 2018-03-31 — End: 2018-04-04
  Administered 2018-04-02: 07:00:00 via INTRAVENOUS

## 2018-03-31 MED ORDER — NOREPINEPHRINE 4 MG/250ML-% IV SOLN
2.0000 ug/min | INTRAVENOUS | Status: DC
Start: 2018-03-31 — End: 2018-03-31
  Administered 2018-03-31: 5 ug/min via INTRAVENOUS
  Administered 2018-03-31: 6 ug/min via INTRAVENOUS
  Filled 2018-03-31 (×2): qty 250

## 2018-03-31 MED ORDER — FENTANYL CITRATE (PF) 250 MCG/5ML IJ SOLN
INTRAMUSCULAR | Status: AC
Start: 2018-03-31 — End: ?
  Filled 2018-03-31: qty 25

## 2018-03-31 MED ORDER — PROTAMINE SULFATE 10 MG/ML IV SOLN
INTRAVENOUS | Status: DC | PRN
Start: 2018-03-31 — End: 2018-03-31
  Administered 2018-03-31: 50 mg via INTRAVENOUS
  Administered 2018-03-31: 100 mg via INTRAVENOUS
  Administered 2018-03-31: 50 mg via INTRAVENOUS
  Administered 2018-03-31: 100 mg via INTRAVENOUS
  Administered 2018-03-31: 50 mg via INTRAVENOUS

## 2018-03-31 MED ORDER — VECURONIUM BROMIDE 10 MG IV SOLR
INTRAVENOUS | Status: AC
Start: 2018-03-31 — End: ?
  Filled 2018-03-31: qty 20

## 2018-03-31 MED ORDER — METOPROLOL TARTRATE 5 MG/5ML IV SOLN
2.5000 mg | INTRAVENOUS | Status: DC | PRN
Start: 2018-03-31 — End: 2018-04-15

## 2018-03-31 MED ORDER — ACETAMINOPHEN 160 MG/5ML PO SOLN
1000.0000 mg | Freq: Four times a day (QID) | ORAL | Status: DC
Start: 2018-04-01 — End: 2018-04-05

## 2018-03-31 MED ORDER — ONDANSETRON HCL 4 MG/2ML IJ SOLN
INTRAMUSCULAR | Status: DC | PRN
Start: 2018-03-31 — End: 2018-03-31
  Administered 2018-03-31: 4 mg via INTRAVENOUS

## 2018-03-31 MED ORDER — METOPROLOL TARTRATE 12.5 MG HALF TABLET
12.5000 mg | Freq: Two times a day (BID) | ORAL | Status: DC
Start: 2018-03-31 — End: 2018-04-02
  Administered 2018-04-01 – 2018-04-02 (×3): 12.5 mg via ORAL
  Filled 2018-03-31 (×3): qty 1

## 2018-03-31 MED ORDER — FINASTERIDE 5 MG PO TABS
5.0000 mg | Freq: Every day | ORAL | Status: DC
Start: 2018-04-01 — End: 2018-04-02
  Administered 2018-04-01 – 2018-04-02 (×2): 5 mg via ORAL
  Filled 2018-03-31 (×2): qty 1

## 2018-03-31 MED ORDER — 0.9 % SODIUM CHLORIDE (POUR BTL) OPTIME
TOPICAL | Status: DC | PRN
Start: 2018-03-31 — End: 2018-03-31
  Administered 2018-03-31: 6000 mL

## 2018-03-31 MED ORDER — LACTATED RINGERS IV SOLN
INTRAVENOUS | Status: DC
Start: 2018-03-31 — End: 2018-04-04

## 2018-03-31 MED ORDER — BISACODYL 10 MG RE SUPP
10.0000 mg | Freq: Every day | RECTAL | Status: DC
Start: 2018-04-01 — End: 2018-04-05

## 2018-03-31 MED ORDER — METOPROLOL TARTRATE 12.5 MG HALF TABLET
ORAL | Status: AC
Start: 2018-03-31 — End: 2018-03-31
  Filled 2018-03-31: qty 1

## 2018-03-31 MED ORDER — ACETAMINOPHEN 650 MG RE SUPP
650.0000 mg | Freq: Once | RECTAL | Status: AC
Start: 2018-03-31 — End: 2018-03-31
  Administered 2018-03-31: 650 mg via RECTAL

## 2018-03-31 MED ORDER — SODIUM CHLORIDE 0.9 % IV SOLN
10.0000 mL/h | Freq: Once | INTRAVENOUS | Status: DC
Start: 2018-03-31 — End: 2018-03-31

## 2018-03-31 MED ORDER — ONDANSETRON HCL 4 MG/2ML IJ SOLN
4.0000 mg | Freq: Four times a day (QID) | INTRAMUSCULAR | Status: DC | PRN
Start: 2018-03-31 — End: 2018-04-15
  Administered 2018-04-01 – 2018-04-13 (×9): 4 mg via INTRAVENOUS
  Filled 2018-03-31 (×8): qty 2

## 2018-03-31 MED ORDER — LIDOCAINE 2% (20 MG/ML) 5 ML SYRINGE
INTRAMUSCULAR | Status: AC
Start: 2018-03-31 — End: ?
  Filled 2018-03-31: qty 5

## 2018-03-31 MED ORDER — METOPROLOL TARTRATE 12.5 MG HALF TABLET
12.5000 mg | Freq: Once | ORAL | Status: AC
Start: 2018-03-31 — End: 2018-03-31
  Administered 2018-03-31: 12.5 mg via ORAL

## 2018-03-31 MED ORDER — SODIUM CHLORIDE 0.9 % IV SOLN
250.0000 mL | INTRAVENOUS | Status: DC
Start: 2018-04-01 — End: 2018-04-15

## 2018-03-31 MED ORDER — MAGNESIUM SULFATE 4 GM/100ML IV SOLN
4.0000 g | Freq: Once | INTRAVENOUS | Status: AC
Start: 2018-03-31 — End: 2018-03-31
  Administered 2018-03-31: 4 g via INTRAVENOUS
  Filled 2018-03-31: qty 100

## 2018-03-31 MED ORDER — ACETAMINOPHEN 500 MG PO TABS
1000.0000 mg | Freq: Four times a day (QID) | ORAL | Status: AC
Start: 2018-04-01 — End: 2018-04-05
  Administered 2018-04-01 – 2018-04-05 (×18): 1000 mg via ORAL
  Filled 2018-03-31 (×18): qty 2

## 2018-03-31 MED ORDER — SODIUM CHLORIDE 0.9% FLUSH
10.0000 mL | INTRAVENOUS | Status: DC | PRN
Start: 2018-03-31 — End: 2018-04-07

## 2018-03-31 MED ORDER — SODIUM CHLORIDE (PF) 0.9 % IJ SOLN
INTRAMUSCULAR | Status: AC
Start: 2018-03-31 — End: ?
  Filled 2018-03-31: qty 10

## 2018-03-31 MED ORDER — LACTATED RINGERS IV SOLN
INTRAVENOUS | Status: DC | PRN
Start: 2018-03-31 — End: 2018-03-31
  Administered 2018-03-31 (×2): via INTRAVENOUS

## 2018-03-31 MED ORDER — HEMOSTATIC AGENTS (NO CHARGE) OPTIME
TOPICAL | Status: DC | PRN
Start: 2018-03-31 — End: 2018-03-31
  Administered 2018-03-31 (×3): 1 via TOPICAL
  Administered 2018-03-31: 3 via TOPICAL

## 2018-03-31 MED ORDER — MORPHINE SULFATE (PF) 2 MG/ML IV SOLN
1.0000 mg | INTRAVENOUS | Status: DC | PRN
Start: 2018-03-31 — End: 2018-04-04
  Administered 2018-04-01: 4 mg via INTRAVENOUS
  Administered 2018-04-01 (×4): 2 mg via INTRAVENOUS
  Administered 2018-04-02: 4 mg via INTRAVENOUS
  Filled 2018-03-31 (×2): qty 1
  Filled 2018-03-31 (×3): qty 2

## 2018-03-31 MED ORDER — METOCLOPRAMIDE HCL 5 MG/ML IJ SOLN
10.0000 mg | Freq: Four times a day (QID) | INTRAMUSCULAR | Status: DC
Start: 2018-03-31 — End: 2018-04-05
  Administered 2018-03-31 – 2018-04-05 (×19): 10 mg via INTRAVENOUS
  Filled 2018-03-31 (×18): qty 2

## 2018-03-31 MED ORDER — SODIUM CHLORIDE 0.9% FLUSH
10.0000 mL | Freq: Two times a day (BID) | INTRAVENOUS | Status: DC
Start: 2018-03-31 — End: 2018-04-07
  Administered 2018-04-01 – 2018-04-04 (×5): 10 mL
  Administered 2018-04-04: 20 mL
  Administered 2018-04-05 – 2018-04-06 (×4): 10 mL

## 2018-03-31 MED ORDER — PROPOFOL 10 MG/ML IV BOLUS
INTRAVENOUS | Status: AC
Start: 2018-03-31 — End: ?
  Filled 2018-03-31: qty 20

## 2018-03-31 MED ORDER — FENTANYL CITRATE (PF) 250 MCG/5ML IJ SOLN
INTRAMUSCULAR | Status: AC
Start: 2018-03-31 — End: ?
  Filled 2018-03-31: qty 5

## 2018-03-31 MED ORDER — CHLORHEXIDINE GLUCONATE 0.12 % MT SOLN
15.0000 mL | OROMUCOSAL | Status: AC
Start: 2018-03-31 — End: 2018-03-31
  Administered 2018-03-31: 15 mL via OROMUCOSAL

## 2018-03-31 MED ORDER — VECURONIUM BROMIDE 10 MG IV SOLR
INTRAVENOUS | Status: DC | PRN
Start: 2018-03-31 — End: 2018-03-31
  Administered 2018-03-31: 4 mg via INTRAVENOUS
  Administered 2018-03-31 (×4): 5 mg via INTRAVENOUS

## 2018-03-31 MED ORDER — INSULIN REGULAR BOLUS VIA INFUSION
0.0000 [IU] | Freq: Three times a day (TID) | INTRAVENOUS | Status: DC
Start: 2018-03-31 — End: 2018-04-01
  Filled 2018-03-31: qty 10

## 2018-03-31 SURGICAL SUPPLY — 101 items
ADAPTER CARDIO PERF ANTE/RETRO (ADAPTER) ×4
ADH SKN CLS APL DERMABOND .7 (GAUZE/BANDAGES/DRESSINGS) ×2
ADPR PRFSN 84XANTGRD RTRGD (ADAPTER) ×2
BAG DECANTER FOR FLEXI CONT (MISCELLANEOUS) ×4
BANDAGE ACE 4X5 VEL STRL LF (GAUZE/BANDAGES/DRESSINGS) ×4
BANDAGE ACE 6X5 VEL STRL LF (GAUZE/BANDAGES/DRESSINGS) ×4
BASKET HEART  (ORDER IN 25'S) (MISCELLANEOUS) ×1
BASKET HEART (ORDER IN 25'S) (MISCELLANEOUS) ×1
BASKET HEART (ORDER IN 25S) (MISCELLANEOUS) ×2
BLADE CLIPPER SURG (BLADE)
BLADE NEEDLE 3 SS STRL (BLADE) ×3
BLADE NEEDLE 3MM SS STRL (BLADE) ×1
BLADE STERNUM SYSTEM 6 (BLADE) ×4
BLADE SURG 11 STRL SS (BLADE) ×4
BLADE SURG 12 STRL SS (BLADE) ×4
BNDG GAUZE ELAST 4 BULKY (GAUZE/BANDAGES/DRESSINGS) ×4
CANISTER SUCT 3000ML PPV (MISCELLANEOUS) ×4
CANNULA ARTERIAL NVNT 3/8 22FR (MISCELLANEOUS) ×4
CANNULA GUNDRY RCSP 15FR (MISCELLANEOUS) ×4
CATH CPB KIT VANTRIGT (MISCELLANEOUS) ×4
CATH ROBINSON RED A/P 18FR (CATHETERS) ×12
CATH THORACIC 36FR RT ANG (CATHETERS) ×4
CLIP FOGARTY SPRING 6M (CLIP) ×4
CLIP RETRACTION 3.0MM CORONARY (MISCELLANEOUS) ×4
CLIP VESOCCLUDE SM WIDE 24/CT (CLIP) ×4
COVER WAND RF STERILE (DRAPES) ×4
CRADLE DONUT ADULT HEAD (MISCELLANEOUS) ×4
DERMABOND ADVANCED (GAUZE/BANDAGES/DRESSINGS) ×2
DERMABOND ADVANCED .7 DNX12 (GAUZE/BANDAGES/DRESSINGS) ×2
DRAIN CHANNEL 32F RND 10.7 FF (WOUND CARE) ×4
DRAPE CARDIOVASCULAR INCISE (DRAPES) ×4
DRAPE SLUSH/WARMER DISC (DRAPES) ×4
DRAPE SRG 135X102X78XABS (DRAPES) ×2
DRSG AQUACEL AG ADV 3.5X14 (GAUZE/BANDAGES/DRESSINGS) ×4
ELECT BLADE 4.0 EZ CLEAN MEGAD (MISCELLANEOUS) ×4
ELECT BLADE 6.5 EXT (BLADE) ×4
ELECT CAUTERY BLADE 6.4 (BLADE) ×4
ELECT REM PT RETURN 9FT ADLT (ELECTROSURGICAL) ×8
FELT TEFLON 1X6 (MISCELLANEOUS) ×8
GAUZE SPONGE 4X4 12PLY STRL (GAUZE/BANDAGES/DRESSINGS) ×8
GAUZE SPONGE 4X4 12PLY STRL LF (GAUZE/BANDAGES/DRESSINGS) ×8
GLOVE BIO SURGEON STRL SZ 6.5 (GLOVE) ×15
GLOVE BIO SURGEON STRL SZ7.5 (GLOVE) ×12
GLOVE BIO SURGEONS STRL SZ 6.5 (GLOVE) ×5
GOWN STRL REUS W/ TWL LRG LVL3 (GOWN DISPOSABLE) ×18
GOWN STRL REUS W/TWL LRG LVL3 (GOWN DISPOSABLE) ×36
HEMOSTAT POWDER SURGIFOAM 1G (HEMOSTASIS) ×12
HEMOSTAT SURGICEL 2X14 (HEMOSTASIS) ×4
INSERT FOGARTY XLG (MISCELLANEOUS)
KIT BASIN OR (CUSTOM PROCEDURE TRAY) ×4
KIT SUCTION CATH 14FR (SUCTIONS) ×4
KIT TURNOVER KIT B (KITS) ×4
KIT VASOVIEW HEMOPRO 2 VH 4000 (KITS) ×4
LEAD PACING MYOCARDI (MISCELLANEOUS) ×4
MARKER GRAFT CORONARY BYPASS (MISCELLANEOUS) ×16
NS IRRIG 1000ML POUR BTL (IV SOLUTION) ×20
PACK E OPEN HEART (SUTURE) ×4
PACK OPEN HEART (CUSTOM PROCEDURE TRAY) ×4
PAD ARMBOARD 7.5X6 YLW CONV (MISCELLANEOUS) ×8
PAD ELECT DEFIB RADIOL ZOLL (MISCELLANEOUS) ×4
PENCIL BUTTON HOLSTER BLD 10FT (ELECTRODE) ×4
PUNCH AORTIC ROTATE  4.5MM 8IN (MISCELLANEOUS) ×4
PUNCH AORTIC ROTATE 4.0MM (MISCELLANEOUS)
PUNCH AORTIC ROTATE 4.5MM 8IN (MISCELLANEOUS)
PUNCH AORTIC ROTATE 5MM 8IN (MISCELLANEOUS)
SET CARDIOPLEGIA MPS 5001102 (MISCELLANEOUS) ×4
SPONGE LAP 18X18 X RAY DECT (DISPOSABLE) ×4
SURGIFLO W/THROMBIN 8M KIT (HEMOSTASIS) ×4
SUT BONE WAX W31G (SUTURE) ×4
SUT MNCRL AB 4-0 PS2 18 (SUTURE) ×4
SUT PROLENE 3 0 SH DA (SUTURE) ×16
SUT PROLENE 3 0 SH1 36 (SUTURE)
SUT PROLENE 4 0 RB 1 (SUTURE) ×4
SUT PROLENE 4 0 SH DA (SUTURE) ×8
SUT PROLENE 4-0 RB1 .5 CRCL 36 (SUTURE) ×2
SUT PROLENE 5 0 C 1 36 (SUTURE)
SUT PROLENE 6 0 C 1 30 (SUTURE) ×16
SUT PROLENE 6 0 CC (SUTURE) ×16
SUT PROLENE 8 0 BV175 6 (SUTURE) ×8
SUT PROLENE BLUE 7 0 (SUTURE) ×12
SUT SILK  1 MH (SUTURE)
SUT SILK 1 MH (SUTURE)
SUT SILK 2 0 SH CR/8 (SUTURE) ×4
SUT SILK 3 0 SH CR/8 (SUTURE)
SUT STEEL 6MS V (SUTURE) ×4
SUT STEEL SZ 6 DBL 3X14 BALL (SUTURE) ×8
SUT VIC AB 1 CTX 36 (SUTURE) ×16
SUT VIC AB 1 CTX36XBRD ANBCTR (SUTURE) ×8
SUT VIC AB 2-0 CT1 27 (SUTURE) ×4
SUT VIC AB 2-0 CT1 TAPERPNT 27 (SUTURE) ×2
SUT VIC AB 2-0 CTX 27 (SUTURE)
SUT VIC AB 3-0 X1 27 (SUTURE)
SYSTEM SAHARA CHEST DRAIN ATS (WOUND CARE) ×4
TAPE CLOTH SURG 4X10 WHT LF (GAUZE/BANDAGES/DRESSINGS) ×4
TAPE PAPER 2X10 WHT MICROPORE (GAUZE/BANDAGES/DRESSINGS) ×4
TOWEL GREEN STERILE (TOWEL DISPOSABLE) ×4
TOWEL GREEN STERILE FF (TOWEL DISPOSABLE) ×4
TRAY FOLEY SLVR 16FR TEMP STAT (SET/KITS/TRAYS/PACK) ×4
TUBING INSUFFLATION (TUBING) ×4
UNDERPAD 30X30 (UNDERPADS AND DIAPERS) ×4
WATER STERILE IRR 1000ML POUR (IV SOLUTION) ×8

## 2018-03-31 NOTE — Progress Notes (Signed)
Pre Procedure note for inpatients:   Calvin FantasiaLarry F Price has been scheduled for Procedure(s): CORONARY ARTERY BYPASS GRAFTING (CABG) (N/A) TRANSESOPHAGEAL ECHOCARDIOGRAM (TEE) (N/A) today. The various methods of treatment have been discussed with the patient. After consideration of the risks, benefits and treatment options the patient has consented to the planned procedure.   The patient has been seen and labs reviewed. There are no changes in the patient's condition to prevent proceeding with the planned procedure today.  Recent labs:  Lab Results  Component Value Date   WBC 8.6 03/26/2018   HGB 11.9 (L) 03/26/2018   HCT 38.4 (L) 03/26/2018   PLT 236 03/26/2018   GLUCOSE 140 (H) 03/26/2018   ALT 49 (H) 03/14/2018   AST 36 03/14/2018   NA 136 03/26/2018   K 4.2 03/26/2018   CL 102 03/26/2018   CREATININE 1.17 03/26/2018   BUN 12 03/26/2018   CO2 23 03/26/2018   TSH 3.640 02/25/2018   INR 0.96 03/26/2018   HGBA1C 7.1 (H) 03/14/2018    Mikey BussingPeter Van Trigt III, MD 03/31/2018 7:12 AM

## 2018-03-31 NOTE — Transfer of Care (Signed)
Immediate Anesthesia Transfer of Care Note  Patient: [name]  Procedure(s) Performed: CORONARY ARTERY BYPASS GRAFTING (CABG) TIMES FOUR USING LEFT INTERNAL MAMMARY ARTERY AND RIGHT GREATER SAPHENOUS VEIN HARVESTED ENDOSCOPICALLY. (N/A Chest) TRANSESOPHAGEAL ECHOCARDIOGRAM (TEE) (N/A )  Patient Location: SICU  Anesthesia Type:General  Level of Consciousness: Patient remains intubated per anesthesia plan  Airway & Oxygen Therapy: Patient placed on Ventilator (see vital sign flow sheet for setting)  Post-op Assessment: Report given to RN and Post -op Vital signs reviewed and stable  Post vital signs: Reviewed and stable  Last Vitals:  Vitals Value Taken Time  BP 75/47 03/31/2018  2:43 PM  Temp 36.1 C 03/31/2018  2:51 PM  Pulse 90 03/31/2018  2:51 PM  Resp 13 03/31/2018  2:51 PM  SpO2 96 % 03/31/2018  2:51 PM  Vitals shown include unvalidated device data.  Last Pain: There were no vitals filed for this visit.       Complications: No apparent anesthesia complications

## 2018-03-31 NOTE — Brief Op Note (Signed)
03/31/2018  11:59 AM  PATIENT:  [name]  72 y.o. male  PRE-OPERATIVE DIAGNOSIS:  CAD  POST-OPERATIVE DIAGNOSIS:  CAD  PROCEDURE:  TRANSESOPHAGEAL ECHOCARDIOGRAM (TEE), MEDIAN STERNOTOMY for CORONARY ARTERY BYPASS GRAFTING (CABG) TIMES 4 (LIMA to LAD, SVG to OM, SVG to RAMUS INTERMEDIATE, and SVG to PLB) USING LEFT INTERNAL MAMMARY ARTERY AND RIGHT GREATER SAPHENOUS VEIN HARVESTED ENDOSCOPICALLY.   SURGEON:  Surgeon(s) and Role:    Kerin PernaVan Trigt, Peter, MD - Primary  PHYSICIAN ASSISTANT: Doree Fudgeonielle Zimmerman PA-C  ASSISTANTS: Benay SpiceMarie Irwin RNFA  ANESTHESIA:   general  EBL:  As per anesthesia and perfusion record  BLOOD ADMINISTERED:two FFP and one PLTS  DRAINS: Chest tubes placed in the mediastinal and pleural spaces   COUNTS CORRECT:  YES  DICTATION: .Dragon Dictation  PLAN OF CARE: Admit to inpatient   PATIENT DISPOSITION:  ICU - intubated and hemodynamically stable.   Delay start of Pharmacological VTE agent (>24hrs) due to surgical blood loss or risk of bleeding: yes  BASELINE WEIGHT: 126 kg

## 2018-03-31 NOTE — Anesthesia Procedure Notes (Signed)
Performed by: Carmela RimaMartinelli, Jovahn Breit F, CRNA Airway Equipment and Method: Fiberoptic brochoscope

## 2018-03-31 NOTE — Progress Notes (Signed)
  Echocardiogram Echocardiogram Transesophageal has been performed.  Leta JunglingCooper, Kealani Leckey M 03/31/2018, 8:10 AM

## 2018-03-31 NOTE — Anesthesia Procedure Notes (Signed)
Procedure Name: Intubation Date/Time: 03/31/2018 7:38 AM Performed by: Neldon Newport, CRNA Pre-anesthesia Checklist: Timeout performed, Patient being monitored, Suction available, Emergency Drugs available and Patient identified Patient Re-evaluated:Patient Re-evaluated prior to induction Oxygen Delivery Method: Circle system utilized Preoxygenation: Pre-oxygenation with 100% oxygen Induction Type: IV induction Ventilation: Mask ventilation without difficulty and Oral airway inserted - appropriate to patient size Laryngoscope Size: Mac and 4 Grade View: Grade II Tube type: Oral Tube size: 8.0 mm Placement Confirmation: breath sounds checked- equal and bilateral,  positive ETCO2 and ETT inserted through vocal cords under direct vision Secured at: 24 cm Tube secured with: Tape Dental Injury: Teeth and Oropharynx as per pre-operative assessment

## 2018-03-31 NOTE — Anesthesia Procedure Notes (Signed)
Central Venous Catheter Insertion Performed by: Achille RichHodierne, Javonda Suh, MD, anesthesiologist Start/End12/05/2017 6:31 AM, 03/31/2018 6:41 AM Patient location: Pre-op. Preanesthetic checklist: patient identified, IV checked, site marked, risks and benefits discussed, surgical consent, monitors and equipment checked, pre-op evaluation, timeout performed and anesthesia consent Position: Trendelenburg Lidocaine 1% used for infiltration and patient sedated Hand hygiene performed , maximum sterile barriers used  and Seldinger technique used Catheter size: 8.5 Fr Central line and PA cath was placed.Sheath introducer Swan type:thermodilation Procedure performed using ultrasound guided technique. Ultrasound Notes:anatomy identified, needle tip was noted to be adjacent to the nerve/plexus identified, no ultrasound evidence of intravascular and/or intraneural injection and image(s) printed for medical record Attempts: 1 Following insertion, line sutured, dressing applied and Biopatch. Post procedure assessment: blood return through all ports, free fluid flow and no air  Patient tolerated the procedure well with no immediate complications.

## 2018-03-31 NOTE — Progress Notes (Signed)
      [address]       [place] [id number]             [phone]      S/p CABG x 4  BP (!) 100/55   Pulse 90   Temp (!) 97.2 F (36.2 C)   Resp 15   SpO2 92%   Intake/Output Summary (Last 24 hours) at 03/31/2018 1716 Last data filed at 03/31/2018 1700 Gross per 24 hour  Intake 7083.32 ml  Output 2265 ml  Net 4818.32 ml   PSV 12 70%, 5 PEEP  PA= 30/13, CI= 2.9 on milrinone 0.25, DA @ 3  Will change neo to levophed to maintain SVR Continue dopamine, decrease milrinone to 0.125  Viviann SpareSteven C. Dorris FetchHendrickson, MD [facility] [phone]

## 2018-03-31 NOTE — H&P (Signed)
Disease       Surgical eval, Cardiac Cath 02/27/18, ECHO 02/21/18    Patient examined, images of coronary angiogram and report of 2D echocardiogram personally reviewed and counseled with patient HPI: 72 year old obese diabetic reformed smoker with symptoms of exertional angina and positive stress test.  Cardiac catheterization performed last week by Dr. Nanetta Batty demonstrated severe multivessel CAD with a left dominant coronary circulation.  I have reviewed the images and although he has severe diabetic disease, it appears there are graftable vessels for the OM1, distal circumflex, LAD, and possible first diagonal.  Ejection fraction by stress test and echo was normal.  LVEDP is 10.  Patient denies resting angina.  He has no symptoms of heart failure.  He has no history of atrial fibrillation.   Patient has had significant medical problems in the past   1.5 years ago he was admitted to [facility] with sepsis from a left foot infection which required debridement and several weeks of IV antibiotics with pick line   5 years ago following a colonoscopy the patient developed a hepatic abscess which required several weeks of antibiotics as well as a percutaneous drain.   Patient denies any previous thoracic surgery or thoracic injuries.  No history of pneumonia but he does get allergic bronchitis in the spring and fall.   Risk factors for CAD include obesity, dyslipidemia, diabetes, hypertension and probable family history.   Patient is a non-insulin-dependent diabetic taking metformin.  His last hemoglobin A1c was 7.5.   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  His wife passed away 5 years ago and he lives by himself.  He plans on staying with his daughter who lives in [place] for support after surgery.   Past Medical History:  Diagnosis Date  . Allergies    . Anxiety    . CAD (coronary artery disease)    . Chest pain on  exertion    . Diabetes (HCC)    . Gout    . Hepatitis      at 72 years old  . Hyperlipemia    . Hypertension             Past Surgical History:  Procedure Laterality Date  . LEFT HEART CATH AND CORONARY ANGIOGRAPHY N/A 02/27/2018    Procedure: LEFT HEART CATH AND CORONARY ANGIOGRAPHY;  Surgeon: Runell Gess, MD;  Location: [facility] INVASIVE CV LAB;  Service: Cardiovascular;  Laterality: N/A;  . liver absess at 72 years old               Family History  Problem Relation Age of Onset  . Atrial fibrillation Mother    . Stroke Mother    . Cancer Maternal Aunt        Social History Social History         Tobacco Use  . Smoking status: Former Smoker      Types: Cigarettes  . Smokeless tobacco: Never Used  . Tobacco comment: quit 1982  Substance Use Topics  . Alcohol use: Not Currently  . Drug use: Never            Current Outpatient Medications  Medication Sig Dispense Refill  . acetaminophen (TYLENOL) 500 MG tablet Take 1,000 mg by mouth daily as needed for moderate pain.      Marland Kitchen amitriptyline (ELAVIL) 50 MG tablet Take 50 mg by mouth at bedtime.       Marland Kitchen  amLODipine (NORVASC) 5 MG tablet Take 5 mg by mouth daily.      Marland Kitchen aspirin 325 MG tablet Take 325 mg by mouth at bedtime.       . Calcium Carb-Cholecalciferol (CALCIUM 600+D3) 600-800 MG-UNIT TABS Take 1 tablet by mouth at bedtime.       . Cholecalciferol (VITAMIN D3) 2000 units TABS Take 2,000 Units by mouth 2 (two) times daily.       Marland Kitchen docusate sodium (COLACE) 100 MG capsule Take 100 mg by mouth 2 (two) times daily.       . finasteride (PROSCAR) 5 MG tablet Take 5 mg by mouth daily.      Marland Kitchen loratadine (CLARITIN) 10 MG tablet Take 10 mg by mouth daily.      Marland Kitchen losartan (COZAAR) 100 MG tablet Take 100 mg by mouth daily.      . metFORMIN (GLUCOPHAGE) 1000 MG tablet Take 1,000 mg by mouth 2 (two) times daily.      . Multiple Vitamins-Minerals (MULTIVITAMIN ADULT PO) Take 1 tablet by mouth daily.      . nitroGLYCERIN  (NITROSTAT) 0.4 MG SL tablet Place 0.4 mg under the tongue every 5 (five) minutes as needed for chest pain.       . pravastatin (PRAVACHOL) 40 MG tablet Take 40 mg by mouth at bedtime.         No current facility-administered medications for this visit.           Allergies  Allergen Reactions  . Ciprofloxacin Rash  . Erythromycin Ethylsuccinate Rash      Review of Systems                        Review of Systems :  [ y ] = yes, [  ] = no         General :  Weight gain [ y  ]    Weight loss  [   ]  Fatigue [  y]  Fever [  ]  Chills  [  ]                                            HEENT    Headache [  ]  Dizziness [  ]  Blurred vision [  ] Glaucoma  [  ]                          Nosebleeds [  ] Painful or loose teeth [ y, left molar]         Cardiac :  Chest pain/ pressure Cove.Etienne  ]  Resting SOB [  ] exertional SOB Cove.Etienne  ]                        Orthopnea [  ]  Pedal edema  [  ]  Palpitations [  ] Syncope/presyncope [ ]                         Paroxysmal nocturnal dyspnea [  ]          Pulmonary : cough Cove.Etienne  ]  wheezing [  ]  Hemoptysis [  ] Sputum [  ] Snoring [  ]  Pneumothorax [  ]  Sleep apnea [  ]         GI : Vomiting [  ]  Dysphagia [  ]  Melena  [  ]  Abdominal pain [  ] BRBPR [  ]              Heart burn [  ]  Constipation [  ] Diarrhea  [  ] Colonoscopy [   ]         GU : Hematuria [  ]  Dysuria [  ]  Nocturia [  ] UTI's [  ]         Vascular : Claudication [  ]  Rest pain [  ]  DVT [  ] Vein stripping [  ] leg ulcers [  ]                          TIA [  ] Stroke [  ]  Varicose veins [  ]         NEURO :  Headaches  [  ] Seizures [  ] Vision changes [  ] Paresthesias [  ]                                                Musculoskeletal :  Arthritis [  ] Gout  [  ]  Back pain [  ]  Joint pain [  ]         Skin :  Rash [  ]  Melanoma [  ] Sores [  ]         Heme : Bleeding problems [  ]Clotting Disorders [  ] Anemia [  ]Blood Transfusion [ ]            Endocrine : Diabetes [ y ] Heat or Cold intolerance [  ] Polyuria [  ]excessive thirst [ ]          Psych : Depression [  ]  Anxiety Cove.Etienne  ]  Psych hospitalizations [  ] Memory change [  ]                Social history   Patient stopped smoking 1984 He lives alone but still quite active He enjoys playing golf 3 days a week but developed chest pain usually after the first or second hole                                                               BP (!) 170/88   Pulse 92   Resp 20   Ht 5\' 11"  (1.803 m)   Wt 260 lb (117.9 kg)   SpO2 96% Comment: RA  BMI 36.26 kg/m  Physical Exam         Physical Exam   General: Very nice 72 year old gentleman no acute distress, obese HEENT: Normocephalic pupils equal , dentition adequate Neck: Supple without JVD, adenopathy, or bruit Chest: Clear to auscultation, symmetrical breath sounds, no rhonchi, no tenderness             or deformity Cardiovascular:  Regular rate and rhythm, no murmur, no gallop, peripheral pulses             palpable in all extremities Abdomen:  Soft, nontender, no palpable mass or organomegaly Extremities: Warm, well-perfused, no clubbing cyanosis edema or tenderness,              no venous stasis changes of the legs Rectal/GU: Deferred Neuro: Grossly non--focal and symmetrical throughout Skin: Clean and dry without rash or ulceration   Diagnostic Tests: Severe three-vessel coronary disease by angiogram Echo report shows EF 55% without significant valve disease   Impression: I agree that the patient would benefit from multivessel CABG for improved survival, and preservation of LV function, and improved symptoms.  He will stop his Elavil now.  [facility]  This is at his request so that his daughter will be present after she travels the mid part of November.  He will stop his metformin 2 days before surgery.   I have prescribed the patient Toprol-XL 25  mg daily to provide him beta-blocker protection as well as to help with his hypertension   Plan: Surgery scheduled for November 19, multivessel CABG. Benefits, risks, alternatives, and expected postop recovery all reviewed with patient.   Mikey BussingPeter Van Trigt III, MD [facility] [phone]        Electronically signed by Kerin PernaVan Trigt, Ammaar Encina, MD at 03/04/2018  5:21 PM    Surgical Consult on 03/04/2018      Routing History      Detailed Report      Note shared with patient

## 2018-03-31 NOTE — Anesthesia Procedure Notes (Signed)
Arterial Line Insertion Start/End12/05/2017 6:50 AM, 03/31/2018 7:00 AM Performed by: Carmela RimaMartinelli, Eloise Mula F, CRNA, CRNA  Patient location: Pre-op. Lidocaine 1% used for infiltration and patient sedated Left, radial was placed Catheter size: 20 G Hand hygiene performed  and maximum sterile barriers used   Attempts: 1 Procedure performed without using ultrasound guided technique. Following insertion, Biopatch and dressing applied. Post procedure assessment: normal and unchanged  Patient tolerated the procedure well with no immediate complications.

## 2018-04-01 ENCOUNTER — Inpatient Hospital Stay (HOSPITAL_COMMUNITY): Payer: Medicare HMO

## 2018-04-01 ENCOUNTER — Encounter (HOSPITAL_COMMUNITY): Payer: Self-pay | Admitting: Cardiothoracic Surgery

## 2018-04-01 ENCOUNTER — Encounter

## 2018-04-01 LAB — POCT I-STAT 3, ART BLOOD GAS (G3+)
Acid-base deficit: 1 mmol/L (ref 0.0–2.0)
Acid-base deficit: 3 mmol/L — ABNORMAL HIGH (ref 0.0–2.0)
Acid-base deficit: 3 mmol/L — ABNORMAL HIGH (ref 0.0–2.0)
Bicarbonate: 25 mmol/L (ref 20.0–28.0)
Bicarbonate: 24.3 mmol/L (ref 20.0–28.0)
Bicarbonate: 23.2 mmol/L (ref 20.0–28.0)
Bicarbonate: 21.9 mmol/L (ref 20.0–28.0)
Bicarbonate: 21.8 mmol/L (ref 20.0–28.0)
O2 Saturation: 97 %
O2 Saturation: 92 %
O2 Saturation: 91 %
O2 Saturation: 92 %
O2 Saturation: 90 %
Patient temperature: 37.6
Patient temperature: 37.7
Patient temperature: 37.7
Patient temperature: 37.7
Patient temperature: 99.6
TCO2: 26 mmol/L (ref 22–32)
TCO2: 25 mmol/L (ref 22–32)
TCO2: 24 mmol/L (ref 22–32)
TCO2: 23 mmol/L (ref 22–32)
TCO2: 23 mmol/L (ref 22–32)
pCO2 arterial: 40.1 mmHg (ref 32.0–48.0)
pCO2 arterial: 39.6 mmHg (ref 32.0–48.0)
pCO2 arterial: 37.7 mmHg (ref 32.0–48.0)
pCO2 arterial: 36.5 mmHg (ref 32.0–48.0)
pCO2 arterial: 37.9 mmHg (ref 32.0–48.0)
pH, Arterial: 7.405 (ref 7.350–7.450)
pH, Arterial: 7.399 (ref 7.350–7.450)
pH, Arterial: 7.4 (ref 7.350–7.450)
pH, Arterial: 7.388 (ref 7.350–7.450)
pH, Arterial: 7.371 (ref 7.350–7.450)
pO2, Arterial: 90 mmHg (ref 83.0–108.0)
pO2, Arterial: 66 mmHg — ABNORMAL LOW (ref 83.0–108.0)
pO2, Arterial: 63 mmHg — ABNORMAL LOW (ref 83.0–108.0)
pO2, Arterial: 67 mmHg — ABNORMAL LOW (ref 83.0–108.0)
pO2, Arterial: 61 mmHg — ABNORMAL LOW (ref 83.0–108.0)

## 2018-04-01 LAB — CBC
HCT: 25.1 % — ABNORMAL LOW (ref 39.0–52.0)
HCT: 27 % — ABNORMAL LOW (ref 39.0–52.0)
Hemoglobin: 7.8 g/dL — ABNORMAL LOW (ref 13.0–17.0)
Hemoglobin: 8.5 g/dL — ABNORMAL LOW (ref 13.0–17.0)
MCH: 27.5 pg (ref 26.0–34.0)
MCH: 28.3 pg (ref 26.0–34.0)
MCHC: 31.1 g/dL (ref 30.0–36.0)
MCHC: 31.5 g/dL (ref 30.0–36.0)
MCV: 88.4 fL (ref 80.0–100.0)
MCV: 90 fL (ref 80.0–100.0)
Platelets: 147 K/uL — ABNORMAL LOW (ref 150–400)
Platelets: 160 K/uL (ref 150–400)
RBC: 2.84 MIL/uL — ABNORMAL LOW (ref 4.22–5.81)
RBC: 3 MIL/uL — ABNORMAL LOW (ref 4.22–5.81)
RDW: 14.3 % (ref 11.5–15.5)
RDW: 14.9 % (ref 11.5–15.5)
WBC: 10.6 K/uL — ABNORMAL HIGH (ref 4.0–10.5)
WBC: 18 K/uL — ABNORMAL HIGH (ref 4.0–10.5)
nRBC: 0 % (ref 0.0–0.2)
nRBC: 0 % (ref 0.0–0.2)

## 2018-04-01 LAB — BASIC METABOLIC PANEL
Anion gap: 9 (ref 5–15)
BUN: 12 mg/dL (ref 8–23)
CO2: 21 mmol/L — ABNORMAL LOW (ref 22–32)
Calcium: 8.2 mg/dL — ABNORMAL LOW (ref 8.9–10.3)
Chloride: 106 mmol/L (ref 98–111)
Creatinine, Ser: 1.34 mg/dL — ABNORMAL HIGH (ref 0.61–1.24)
GFR calc Af Amer: 59 mL/min — ABNORMAL LOW (ref >60)
GFR calc non Af Amer: 51 mL/min — ABNORMAL LOW (ref >60)
Glucose, Bld: 106 mg/dL — ABNORMAL HIGH (ref 70–99)
Potassium: 4.7 mmol/L (ref 3.5–5.1)
Sodium: 136 mmol/L (ref 135–145)

## 2018-04-01 LAB — POCT I-STAT, CHEM 8
BUN: 15 mg/dL (ref 8–23)
Calcium, Ion: 1.22 mmol/L (ref 1.15–1.40)
Chloride: 102 mmol/L (ref 98–111)
Creatinine, Ser: 1.3 mg/dL — ABNORMAL HIGH (ref 0.61–1.24)
Glucose, Bld: 218 mg/dL — ABNORMAL HIGH (ref 70–99)
HCT: 27 % — ABNORMAL LOW (ref 39.0–52.0)
Hemoglobin: 9.2 g/dL — ABNORMAL LOW (ref 13.0–17.0)
Potassium: 4.3 mmol/L (ref 3.5–5.1)
Sodium: 136 mmol/L (ref 135–145)
TCO2: 23 mmol/L (ref 22–32)

## 2018-04-01 LAB — CREATININE, SERUM
Creatinine, Ser: 1.63 mg/dL — ABNORMAL HIGH (ref 0.61–1.24)
GFR calc Af Amer: 47 mL/min — ABNORMAL LOW (ref >60)
GFR calc non Af Amer: 40 mL/min — ABNORMAL LOW (ref >60)

## 2018-04-01 LAB — GLUCOSE, CAPILLARY
Glucose-Capillary: 102 mg/dL — ABNORMAL HIGH (ref 70–99)
Glucose-Capillary: 106 mg/dL — ABNORMAL HIGH (ref 70–99)
Glucose-Capillary: 108 mg/dL — ABNORMAL HIGH (ref 70–99)
Glucose-Capillary: 109 mg/dL — ABNORMAL HIGH (ref 70–99)
Glucose-Capillary: 109 mg/dL — ABNORMAL HIGH (ref 70–99)
Glucose-Capillary: 110 mg/dL — ABNORMAL HIGH (ref 70–99)
Glucose-Capillary: 111 mg/dL — ABNORMAL HIGH (ref 70–99)
Glucose-Capillary: 114 mg/dL — ABNORMAL HIGH (ref 70–99)
Glucose-Capillary: 117 mg/dL — ABNORMAL HIGH (ref 70–99)
Glucose-Capillary: 120 mg/dL — ABNORMAL HIGH (ref 70–99)
Glucose-Capillary: 124 mg/dL — ABNORMAL HIGH (ref 70–99)
Glucose-Capillary: 128 mg/dL — ABNORMAL HIGH (ref 70–99)
Glucose-Capillary: 187 mg/dL — ABNORMAL HIGH (ref 70–99)

## 2018-04-01 LAB — COOXEMETRY PANEL
Carboxyhemoglobin: 1.4 % (ref 0.5–1.5)
Methemoglobin: 1.2 % (ref 0.0–1.5)
O2 Saturation: 58.6 %
Total hemoglobin: 8.1 g/dL — ABNORMAL LOW (ref 12.0–16.0)

## 2018-04-01 LAB — MAGNESIUM
Magnesium: 2.4 mg/dL (ref 1.7–2.4)
Magnesium: 2.2 mg/dL (ref 1.7–2.4)

## 2018-04-01 MED ORDER — INSULIN DETEMIR 100 UNIT/ML ~~LOC~~ SOLN
20.0000 [IU] | Freq: Two times a day (BID) | SUBCUTANEOUS | Status: DC
Start: 2018-04-01 — End: 2018-04-02
  Administered 2018-04-01: 20 [IU] via SUBCUTANEOUS
  Filled 2018-04-01 (×2): qty 0.2

## 2018-04-01 MED ORDER — MIDAZOLAM HCL 2 MG/2ML IJ SOLN
2.0000 mg | INTRAMUSCULAR | Status: DC | PRN
Start: 2018-04-01 — End: 2018-04-02

## 2018-04-01 MED ORDER — OXYCODONE HCL 5 MG PO TABS
5.0000 mg | ORAL | Status: DC | PRN
Start: 2018-04-01 — End: 2018-04-15
  Administered 2018-04-01 – 2018-04-02 (×3): 10 mg via ORAL
  Administered 2018-04-02: 5 mg via ORAL
  Administered 2018-04-02: 10 mg via ORAL
  Administered 2018-04-02 (×2): 5 mg via ORAL
  Administered 2018-04-03 (×4): 10 mg via ORAL
  Administered 2018-04-03: 5 mg via ORAL
  Administered 2018-04-04 – 2018-04-05 (×4): 10 mg via ORAL
  Administered 2018-04-05 – 2018-04-06 (×2): 5 mg via ORAL
  Administered 2018-04-06: 10 mg via ORAL
  Administered 2018-04-10 – 2018-04-13 (×9): 5 mg via ORAL
  Administered 2018-04-14 – 2018-04-15 (×2): 10 mg via ORAL
  Filled 2018-04-01 (×3): qty 2
  Filled 2018-04-01 (×2): qty 1
  Filled 2018-04-01: qty 2
  Filled 2018-04-01: qty 1
  Filled 2018-04-01: qty 2
  Filled 2018-04-01: qty 1
  Filled 2018-04-01 (×2): qty 2
  Filled 2018-04-01 (×4): qty 1
  Filled 2018-04-01 (×4): qty 2
  Filled 2018-04-01 (×3): qty 1
  Filled 2018-04-01 (×2): qty 2
  Filled 2018-04-01 (×2): qty 1
  Filled 2018-04-01 (×2): qty 2
  Filled 2018-04-01: qty 1
  Filled 2018-04-01: qty 2

## 2018-04-01 MED ORDER — INSULIN ASPART 100 UNIT/ML ~~LOC~~ SOLN
0.0000 [IU] | SUBCUTANEOUS | Status: DC
Start: 2018-04-01 — End: 2018-04-05
  Administered 2018-04-01: 8 [IU] via SUBCUTANEOUS
  Administered 2018-04-01 – 2018-04-02 (×2): 4 [IU] via SUBCUTANEOUS
  Administered 2018-04-02: 8 [IU] via SUBCUTANEOUS
  Administered 2018-04-02 (×2): 4 [IU] via SUBCUTANEOUS
  Administered 2018-04-02: 8 [IU] via SUBCUTANEOUS
  Administered 2018-04-02: 4 [IU] via SUBCUTANEOUS
  Administered 2018-04-03: 8 [IU] via SUBCUTANEOUS
  Administered 2018-04-03 (×3): 4 [IU] via SUBCUTANEOUS
  Administered 2018-04-03: 8 [IU] via SUBCUTANEOUS
  Administered 2018-04-04: 2 [IU] via SUBCUTANEOUS
  Administered 2018-04-04 (×5): 4 [IU] via SUBCUTANEOUS
  Administered 2018-04-05 (×2): 2 [IU] via SUBCUTANEOUS

## 2018-04-01 MED ORDER — INSULIN ASPART 100 UNIT/ML ~~LOC~~ SOLN
6.0000 [IU] | Freq: Three times a day (TID) | SUBCUTANEOUS | Status: DC
Start: 2018-04-01 — End: 2018-04-08
  Administered 2018-04-03 – 2018-04-06 (×10): 6 [IU] via SUBCUTANEOUS

## 2018-04-01 MED ORDER — VANCOMYCIN HCL IN DEXTROSE 1-5 GM/200ML-% IV SOLN
1000.0000 mg | Freq: Once | INTRAVENOUS | Status: AC
Start: 2018-04-01 — End: 2018-04-01
  Administered 2018-04-01: 1000 mg via INTRAVENOUS
  Filled 2018-04-01: qty 200

## 2018-04-01 MED ORDER — ORAL CARE MOUTH RINSE
15.0000 mL | Freq: Two times a day (BID) | OROMUCOSAL | Status: DC
Start: 2018-04-01 — End: 2018-04-08
  Administered 2018-04-01 – 2018-04-08 (×7): 15 mL via OROMUCOSAL

## 2018-04-01 MED ORDER — INSULIN DETEMIR 100 UNIT/ML ~~LOC~~ SOLN
15.0000 [IU] | Freq: Two times a day (BID) | SUBCUTANEOUS | Status: DC
Start: 2018-04-01 — End: 2018-04-01
  Administered 2018-04-01: 15 [IU] via SUBCUTANEOUS
  Filled 2018-04-01 (×2): qty 0.15

## 2018-04-01 MED ORDER — FE FUMARATE-B12-VIT C-FA-IFC PO CAPS
1.0000 | Freq: Every day | ORAL | Status: DC
Start: 2018-04-02 — End: 2018-04-07
  Administered 2018-04-02 – 2018-04-06 (×5): 1 via ORAL
  Filled 2018-04-01 (×5): qty 1

## 2018-04-01 MED ORDER — FUROSEMIDE 10 MG/ML IJ SOLN
40.0000 mg | Freq: Two times a day (BID) | INTRAMUSCULAR | Status: DC
Start: 2018-04-01 — End: 2018-04-01
  Administered 2018-04-01: 40 mg via INTRAVENOUS
  Filled 2018-04-01: qty 4

## 2018-04-01 MED ORDER — DEXTROSE 5 % IV SOLN
10.0000 mg/h | INTRAVENOUS | Status: DC
Start: 2018-04-01 — End: 2018-04-02
  Administered 2018-04-01: 10 mg/h via INTRAVENOUS
  Filled 2018-04-01 (×2): qty 25

## 2018-04-01 MED FILL — Magnesium Sulfate Inj 50%: INTRAMUSCULAR | Qty: 10 | Status: AC

## 2018-04-01 MED FILL — Heparin Sodium (Porcine) Inj 1000 Unit/ML: INTRAMUSCULAR | Qty: 30 | Status: AC

## 2018-04-01 MED FILL — Electrolyte-R (PH 7.4) Solution: INTRAVENOUS | Qty: 6000 | Status: AC

## 2018-04-01 MED FILL — Heparin Sodium (Porcine) Inj 1000 Unit/ML: INTRAMUSCULAR | Qty: 10 | Status: AC

## 2018-04-01 MED FILL — Mannitol IV Soln 20%: INTRAVENOUS | Qty: 500 | Status: AC

## 2018-04-01 MED FILL — Sodium Chloride IV Soln 0.9%: INTRAVENOUS | Qty: 3000 | Status: AC

## 2018-04-01 MED FILL — Lidocaine HCl(Cardiac) IV PF Soln Pref Syr 100 MG/5ML (2%): INTRAVENOUS | Qty: 10 | Status: AC

## 2018-04-01 MED FILL — Albumin, Human Inj 5%: INTRAVENOUS | Qty: 250 | Status: AC

## 2018-04-01 MED FILL — Sodium Bicarbonate IV Soln 8.4%: INTRAVENOUS | Qty: 50 | Status: AC

## 2018-04-01 MED FILL — Potassium Chloride Inj 2 mEq/ML: INTRAVENOUS | Qty: 40 | Status: AC

## 2018-04-01 NOTE — Op Note (Signed)
NAME: [name], Calvin F. MEDICAL RECORD RU:[id number] ACCOUNT [id number] DATE OF BIRTH:06-Nov-1942 FACILITY: [facility] LOCATION: [facility] PHYSICIAN:PETER VAN TRIGT III, MD  OPERATIVE REPORT  DATE OF PROCEDURE:  03/31/2018  PROCEDURE PERFORMED: 1.  Coronary artery bypass grafting x4 (left internal mammary artery to left anterior descending, saphenous vein graft to ramus intermediate, saphenous vein graft to obtuse marginal, saphenous vein graft to distal circumflex posterolateral). 2.  Endoscopic harvest of right leg greater saphenous vein.  SURGEON:  Kerin PernaPeter Van Trigt, MD  ASSISTANT:  Doree Fudgeonielle Zimmerman, PA-C.  ANESTHESIA:  General by Dr. Desmond Lopeurk.  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  DESCRIPTION OF PROCEDURE:  After the patient was examined in preoperative holding and informed consent was documented and all final questions were addressed, the patient was transferred directly to the operating room.  I had seen the patient previously  in consultation and I discussed the indications and the expected benefits of coronary bypass grafting, the alternatives to surgery for treatment of his CAD, and the potential complications of the operation including the risks of stroke, bleeding,  infection, postoperative pulmonary problems including pleural effusion, postoperative organ failure and death.  He understood that because of his morbid obesity and diabetes, he would be at increased risk for these complications.  He agreed to proceed  with surgery.  DESCRIPTION OF PROCEDURE:  The patient was placed supine on the operating table.  General anesthesia was induced.  A transesophageal echo probe was placed by the anesthesia team.  The patient was prepped and draped as a sterile field.  A proper time-out  was performed.   A sternal incision was made as the saphenous vein was harvested endoscopically from the right leg.  The left internal mammary artery was harvested as a pedicle graft from its origin at the subclavian vessels.  It was a 1.5 mm vessel with  good flow.  The sternal retractor was placed using the deep blades because of the patient's obese body habitus.  The pericardium was opened and suspended.  The aorta was inspected, palpated and found to be free of significant calcified plaque.   Pursestrings were placed in the ascending aorta and right atrium.  Heparin was administered.  When the ACT was documented as being therapeutic, the patient was cannulated and placed on cardiopulmonary bypass.  The coronaries were identified for grafting.   The coronaries were small in a diabetic pattern.  Kandace Parkinshiry were in a deep layer of epicardial fat.  The mammary artery and vein grafts were prepared for the distal anastomoses and cardioplegia cannulas were placed both antegrade and retrograde cold blood  cardioplegia.  A cannula for CO2 insufflation into the operative field was placed.  The patient was cooled to 32 degrees, and the aortic crossclamp was applied.  One liter of cold blood cardioplegia was delivered in split doses between the antegrade  aortic and retrograde coronary sinus catheters.  There was good cardioplegic arrest and supple temperature dropped less than 12 degrees.  Cardioplegia was delivered every 20 minutes.  The distal coronary anastomoses were performed.  First, distal anastomosis was to the posterolateral branch of the distal circumflex.  This was a 1.5 mm vessel.  Reverse saphenous vein was sewn end-to-side with running 7-0 Prolene with good flow through  the graft.  Cardioplegia was redosed.  The second distal anastomosis was the obtuse marginal branch of the  left coronary.  This  was a smaller 1.4 mm vessel 1.4 mm vessel with proximal 95% stenosis.  Reverse saphenous vein was sewn end-to-side with  running 7-0 Prolene with good flow through  the graft.  Cardioplegia was redosed.  The third distal anastomosis was to the ramus intermediate branch of the left coronary.  This was a smaller 1.3 mm vessel with a proximal 80% stenosis.  Reverse saphenous vein of small caliber was sewn end-to-side with running 7-0 Prolene with good flow  through the graft.  Cardioplegia was redosed.  The fourth distal anastomosis was to the LAD.  The LAD was a fairly small atretic vessel, but a 1 mm probe passed distally.  The left IMA pedicle was brought through an opening in the left lateral pericardium and brought down onto the LAD and sewn  end-to-side with running 8-0 Prolene.  There was good flow through the anastomosis after briefly releasing the pedicle bulldog and the mammary artery.  The bulldog was reapplied and the pedicle was secured.  Cardioplegia was redosed.  While the cross clamp was still in place, 2 proximal vein anastomoses were performed on the ascending aorta.  These were the graft to the posterolateral and OM.  The aorta was foreshortened and covered with a heavy layer of fat in the RV outflow tract  and there was not room in the aorta for 3 grafts.  The proximal anastomosis of the ramus intermediate was sewn to the hood of the OM graft.  After all the proximal anastomoses were completed and a dose of retrograde warm blood cardioplegia was delivered,  the cross-clamp was removed.  The heart was cardioverted back to regular rhythm.  The vein grafts were opened and deaired and each had good flow.  Hemostasis was documented at the proximal and distal sites.  The patient was rewarmed and reperfused.   Temporary pacing wires were applied.  The patient was started on low-dose milrinone.  The lungs were reexpanded and ventilator was resumed.  The patient was then weaned off cardiopulmonary bypass.  LV and biventricular function appeared to be normal.   Cardiac output was 5.5 liters per minute off bypass.   Protamine was administered slowly.  There was no adverse reaction.  The cannulas were removed.  The mediastinum was irrigated with saline.  The superior pericardial fat was closed over the aorta.  The  anterior mediastinum and left pleural chest tubes were placed and brought out through separate incisions.  The sternum was closed with wire.  The patient remained stable.  The pectoralis fascia was closed with a running #1 Vicryl.  The patient remained  stable.  The subcutaneous and skin layers were closed using running Vicryl and sterile dressings were applied.    Total cardiopulmonary bypass time was 145 minutes.  AN/NUANCE  D:03/31/2018 T:04/01/2018 JOB:[id number]

## 2018-04-01 NOTE — Plan of Care (Signed)
  Problem: Activity: Goal: Risk for activity intolerance will decrease Outcome: Progressing   Problem: Nutrition: Goal: Adequate nutrition will be maintained Outcome: Progressing   Problem: Pain Managment: Goal: General experience of comfort will improve Outcome: Progressing   Problem: Activity: Goal: Risk for activity intolerance will decrease Outcome: Progressing   

## 2018-04-01 NOTE — Progress Notes (Addendum)
[facility] DAILY ICU PROGRESS NOTE                   [address]            [place] [address]          [phone]   1 Day Post-Op Procedure(s) (LRB): CORONARY ARTERY BYPASS GRAFTING (CABG) TIMES FOUR USING LEFT INTERNAL MAMMARY ARTERY AND RIGHT GREATER SAPHENOUS VEIN HARVESTED ENDOSCOPICALLY. (N/A) TRANSESOPHAGEAL ECHOCARDIOGRAM (TEE) (N/A)  Total Length of Stay:  LOS: 1 day   Subjective: Patient remains intubated but is awake and alert. He is able to follow commands  Objective: Vital signs in last 24 hours: Temp:  [97 F (36.1 C)-99.9 F (37.7 C)] 99.9 F (37.7 C) (12/03 0745) Pulse Rate:  [68-106] 93 (12/03 0745) Cardiac Rhythm: A-V Sequential paced (12/03 0400) Resp:  [10-25] 14 (12/03 0745) BP: (75-129)/(47-77) 99/50 (12/03 0730) SpO2:  [91 %-100 %] 93 % (12/03 0745) Arterial Line BP: (79-210)/(36-206) 151/49 (12/03 0745) FiO2 (%):  [40 %-100 %] 40 % (12/03 0400) Weight:  [137.4 kg] 137.4 kg (12/03 0450)  Filed Weights   04/01/18 0450  Weight: (!) 137.4 kg    Weight change:    Hemodynamic parameters for last 24 hours: PAP: (22-35)/(9-20) 27/18 CO:  [5 L/min-7.2 L/min] 6.9 L/min CI:  [2.1 L/min/m2-3 L/min/m2] 2.9 L/min/m2  Intake/Output from previous day: 12/02 0701 - 12/03 0700 In: 9710.6 [I.V.:6227.9; Blood:1005; IV Piggyback:2477.7] Out: 3210 [Urine:1880; Blood:900; Chest Tube:430]  Intake/Output this shift: Total I/O In: -  Out: 130 [Urine:60; Chest Tube:70]  Current Meds: Scheduled Meds: . acetaminophen  1,000 mg Oral Q6H   Or  . acetaminophen (TYLENOL) oral liquid 160 mg/5 mL  1,000 mg Per Tube Q6H  . aspirin EC  325 mg Oral Daily   Or  . aspirin  324 mg Per Tube Daily  . bisacodyl  10 mg Oral Daily   Or  . bisacodyl  10 mg Rectal Daily  . chlorhexidine gluconate (MEDLINE KIT)  15 mL Mouth Rinse BID  . Chlorhexidine Gluconate Cloth  6 each Topical Daily  . docusate sodium  200 mg Oral Daily  . finasteride  5 mg Oral Daily  .  insulin regular  0-10 Units Intravenous TID WC  . loratadine  10 mg Oral Daily  . mouth rinse  15 mL Mouth Rinse 10 times per day  . metoCLOPramide (REGLAN) injection  10 mg Intravenous Q6H  . metoprolol tartrate  12.5 mg Oral BID   Or  . metoprolol tartrate  12.5 mg Per Tube BID  . multivitamin with minerals  1 tablet Oral Daily  . [START ON 04/02/2018] pantoprazole  40 mg Oral Daily  . pravastatin  40 mg Oral QHS  . sodium chloride flush  10-40 mL Intracatheter Q12H  . sodium chloride flush  3 mL Intravenous Q12H   Continuous Infusions: . sodium chloride 10 mL/hr at 04/01/18 0700  . sodium chloride    . sodium chloride 20 mL/hr at 03/31/18 1440  . cefUROXime (ZINACEF)  IV Stopped (04/01/18 0407)  . dexmedetomidine (PRECEDEX) IV infusion 0.6 mcg/kg/hr (04/01/18 0700)  . DOPamine 3 mcg/kg/min (04/01/18 0700)  . insulin 6 mL/hr at 04/01/18 0700  . lactated ringers    . lactated ringers 10 mL/hr at 04/01/18 0700  . milrinone 0.125 mcg/kg/min (04/01/18 0700)  . nitroGLYCERIN Stopped (03/31/18 1500)  . norepinephrine (LEVOPHED) Adult infusion 4 mcg/min (04/01/18 0229)   PRN Meds:.sodium chloride, metoprolol tartrate, midazolam, morphine injection,  ondansetron (ZOFRAN) IV, oxyCODONE, sodium chloride flush, sodium chloride flush, traMADol  General appearance: cooperative and no distress Neurologic: intact Heart: RRR Lungs: Diminished at bases Abdomen: Soft, non tender, obese, bowel sounds present Extremities: SCD on LLE and mild RLE edema Wound: Dressings intact  Lab Results: CBC: Recent Labs    03/31/18 2045 04/01/18 0225  WBC 15.7* 10.6*  HGB 7.7* 7.8*  HCT 25.8* 25.1*  PLT 214 147*   BMET:  Recent Labs    03/31/18 2038 03/31/18 2045 04/01/18 0225  NA 137  --  136  K 4.5  --  4.7  CL 104  --  106  CO2  --   --  21*  GLUCOSE 197*  --  106*  BUN 16  --  12  CREATININE 1.00 1.21 1.34*  CALCIUM  --   --  8.2*    CMET: Lab Results  Component Value Date   WBC  10.6 (H) 04/01/2018   HGB 7.8 (L) 04/01/2018   HCT 25.1 (L) 04/01/2018   PLT 147 (L) 04/01/2018   GLUCOSE 106 (H) 04/01/2018   ALT 49 (H) 03/14/2018   AST 36 03/14/2018   NA 136 04/01/2018   K 4.7 04/01/2018   CL 106 04/01/2018   CREATININE 1.34 (H) 04/01/2018   BUN 12 04/01/2018   CO2 21 (L) 04/01/2018   TSH 3.640 02/25/2018   INR 1.37 03/31/2018   HGBA1C 7.1 (H) 03/14/2018      PT/INR:  Recent Labs    03/31/18 1458  LABPROT 16.7*  INR 1.37   Radiology: Dg Chest Portable 1 View  Result Date: 03/31/2018 CLINICAL DATA:  Coronary artery bypass graft EXAM: PORTABLE CHEST 1 VIEW COMPARISON:  03/14/2018 FINDINGS: There is prominence of the upper mediastinum compatible with recent cardiac surgery. Endotracheal tube tip is 4.1 cm from the carina. NG tube tip is beyond the gastroesophageal junction. Right jugular introducer and Swan-Ganz catheter tip is in the distal main pulmonary artery. Mediastinal and left chest tubes are in place. There is no evidence of left pneumothorax. Tiny right apical pneumothorax is suspected. Lungs are under aerated. Bibasilar atelectasis. Pulmonary vascularity is within normal limits. IMPRESSION: Tiny right apical pneumothorax. Low volumes with bibasilar atelectasis. Normal pulmonary vascularity.  No evidence of CHF. Electronically Signed   By: Marybelle Killings M.D.   On: 03/31/2018 14:34    Assessment/Plan: S/P Procedure(s) (LRB): CORONARY ARTERY BYPASS GRAFTING (CABG) TIMES FOUR USING LEFT INTERNAL MAMMARY ARTERY AND RIGHT GREATER SAPHENOUS VEIN HARVESTED ENDOSCOPICALLY. (N/A) TRANSESOPHAGEAL ECHOCARDIOGRAM (TEE) (N/A)  1. CV-AV paced. CO/CI 6.9/2.9 this am. EKG showed sinus rhythm. On Milrinone, Norepinephrine, and Dopamine drips. Co ox to be checked this am. 2. Pulmonary-Chest tubes with 430 cc since last 24 hours. CXR this am appears to show low lung volumes, bibasilar atelectasis and small pleural effusions. Weaning vent as able. 3. Volume overload-will  defer timing of Lasix 20 mg IV bid to Dr. Prescott Gum 4. ABL anemia-H and H this am 7.8 and 25.1. Will start oral Trinsicon in am 5. DM-CBGs 114/111/110. On Insulin drip. Was on Metformin prior to admission and will restart once tolerating oral and creatinine improved. Pre op HGA1C 7.1 6. See progression orders    Donielle Liston Alba PA-C 04/01/2018 7:57 AM   Ready for extubation Needs diuresis and DM control Hb 7.8 after 1 u PRBC last pm for expected postop blood loss anemia See progression orders  patient examined and medical record reviewed,agree with above note. Tharon Aquas Trigt III  04/01/2018   

## 2018-04-01 NOTE — Plan of Care (Signed)
  Problem: Cardiac: Goal: Will achieve and/or maintain hemodynamic stability Outcome: Progressing   Problem: Clinical Measurements: Goal: Postoperative complications will be avoided or minimized Outcome: Progressing   Problem: Respiratory: Goal: Respiratory status will improve Outcome: Progressing   Problem: Skin Integrity: Goal: Risk for impaired skin integrity will decrease Outcome: Progressing   Problem: Urinary Elimination: Goal: Ability to achieve and maintain adequate renal perfusion and functioning will improve Outcome: Progressing   

## 2018-04-01 NOTE — Anesthesia Postprocedure Evaluation (Signed)
Anesthesia Post Note  Patient: [name]  Procedure(s) Performed: CORONARY ARTERY BYPASS GRAFTING (CABG) TIMES FOUR USING LEFT INTERNAL MAMMARY ARTERY AND RIGHT GREATER SAPHENOUS VEIN HARVESTED ENDOSCOPICALLY. (N/A Chest) TRANSESOPHAGEAL ECHOCARDIOGRAM (TEE) (N/A )     Patient location during evaluation: SICU Anesthesia Type: General Level of consciousness: sedated and patient remains intubated per anesthesia plan Pain management: pain level controlled Vital Signs Assessment: post-procedure vital signs reviewed and stable Respiratory status: patient remains intubated per anesthesia plan Cardiovascular status: stable Postop Assessment: no apparent nausea or vomiting Anesthetic complications: no    Last Vitals:  Vitals:   04/01/18 0930 04/01/18 0937  BP: 125/61   Pulse: 93   Resp: 13   Temp: 37.7 C   SpO2: 95% 95%    Last Pain:  Vitals:   04/01/18 0400  TempSrc: Core (Comment)                 Cecile HearingStephen Edward Adalis Gatti

## 2018-04-01 NOTE — Progress Notes (Signed)
Patient ID: [name], male   DOB: 02-08-1943, 72 y.o.   MRN: [id number] [facility] Evening Rounds:  Extubated this am. Ambulated.  Hemodynamically stable on milrinone 0.125 and dop 2.5  Urine output has been on low side and lasix drip ordered by PVT. Creat up to 1.63 from 1.33 this am.  sats 93% on 15L HFNC.   Glucose 218 this evening after Levemir 15 this am. Will increase pm dose.  CBC    Component Value Date/Time   WBC 18.0 (H) 04/01/2018 1557   RBC 3.00 (L) 04/01/2018 1557   HGB 9.2 (L) 04/01/2018 1600   HGB 11.8 (L) 02/25/2018 1000   HCT 27.0 (L) 04/01/2018 1600   HCT 34.9 (L) 02/25/2018 1000   PLT 160 04/01/2018 1557   PLT 214 02/25/2018 1000   MCV 90.0 04/01/2018 1557   MCV 83 02/25/2018 1000   MCH 28.3 04/01/2018 1557   MCHC 31.5 04/01/2018 1557   RDW 14.9 04/01/2018 1557   RDW 14.5 02/25/2018 1000   LYMPHSABS 1.3 02/25/2018 1000   EOSABS 0.2 02/25/2018 1000   BASOSABS 0.1 02/25/2018 1000   BMET    Component Value Date/Time   NA 136 04/01/2018 1600   NA 134 02/25/2018 1000   K 4.3 04/01/2018 1600   CL 102 04/01/2018 1600   CO2 21 (L) 04/01/2018 0225   GLUCOSE 218 (H) 04/01/2018 1600   BUN 15 04/01/2018 1600   BUN 13 02/25/2018 1000   CREATININE 1.30 (H) 04/01/2018 1600   CALCIUM 8.2 (L) 04/01/2018 0225   GFRNONAA 40 (L) 04/01/2018 1557   GFRAA 47 (L) 04/01/2018 1557

## 2018-04-01 NOTE — Procedures (Signed)
Extubation Procedure Note  Patient Details:   Name: [name] DOB: [birth year] MRN: [id number]   Airway Documentation:    Vent end date: 04/01/18 Vent end time: 1010   Evaluation  O2 sats: stable throughout Complications: No apparent complications Patient did tolerate procedure well. Bilateral Breath Sounds: Clear   Yes   Patient was extubated to a 15L salter HFNC due to SAT's in the low 80's. SAT's are now 94% on 15L HFNC. Patient was instructed on IS x 5, highest goal reached was 750mL. NIF: -35, VC: .9.  Ricahrd Schwager L 04/01/2018, 10:10 AM

## 2018-04-02 ENCOUNTER — Encounter

## 2018-04-02 ENCOUNTER — Other Ambulatory Visit: Payer: Self-pay

## 2018-04-02 ENCOUNTER — Inpatient Hospital Stay (HOSPITAL_COMMUNITY): Payer: Medicare HMO

## 2018-04-02 LAB — COOXEMETRY PANEL
Carboxyhemoglobin: 1.2 % (ref 0.5–1.5)
Carboxyhemoglobin: 1.5 % (ref 0.5–1.5)
Methemoglobin: 1.4 % (ref 0.0–1.5)
Methemoglobin: 1.1 % (ref 0.0–1.5)
O2 Saturation: 53.1 %
O2 Saturation: 54.1 %
Total hemoglobin: 9.5 g/dL — ABNORMAL LOW (ref 12.0–16.0)
Total hemoglobin: 7.3 g/dL — ABNORMAL LOW (ref 12.0–16.0)

## 2018-04-02 LAB — COMPREHENSIVE METABOLIC PANEL
ALT: 37 U/L (ref 0–44)
AST: 46 U/L — ABNORMAL HIGH (ref 15–41)
Albumin: 3.6 g/dL (ref 3.5–5.0)
Alkaline Phosphatase: 32 U/L — ABNORMAL LOW (ref 38–126)
Anion gap: 12 (ref 5–15)
BUN: 21 mg/dL (ref 8–23)
CO2: 22 mmol/L (ref 22–32)
Calcium: 8.7 mg/dL — ABNORMAL LOW (ref 8.9–10.3)
Chloride: 102 mmol/L (ref 98–111)
Creatinine, Ser: 2.29 mg/dL — ABNORMAL HIGH (ref 0.61–1.24)
GFR calc Af Amer: 31 mL/min — ABNORMAL LOW (ref >60)
GFR calc non Af Amer: 27 mL/min — ABNORMAL LOW (ref >60)
Glucose, Bld: 205 mg/dL — ABNORMAL HIGH (ref 70–99)
Potassium: 5 mmol/L (ref 3.5–5.1)
Sodium: 136 mmol/L (ref 135–145)
Total Bilirubin: 1 mg/dL (ref 0.3–1.2)
Total Protein: 5.6 g/dL — ABNORMAL LOW (ref 6.5–8.1)

## 2018-04-02 LAB — HEMOGLOBIN AND HEMATOCRIT, BLOOD
HCT: 24.6 % — ABNORMAL LOW (ref 39.0–52.0)
Hemoglobin: 7.5 g/dL — ABNORMAL LOW (ref 13.0–17.0)

## 2018-04-02 LAB — POCT I-STAT 3, ART BLOOD GAS (G3+)
Acid-base deficit: 5 mmol/L — ABNORMAL HIGH (ref 0.0–2.0)
Acid-base deficit: 4 mmol/L — ABNORMAL HIGH (ref 0.0–2.0)
Bicarbonate: 22.2 mmol/L (ref 20.0–28.0)
Bicarbonate: 21.5 mmol/L (ref 20.0–28.0)
O2 Saturation: 91 %
O2 Saturation: 90 %
Patient temperature: 97.7
Patient temperature: 98.6
TCO2: 24 mmol/L (ref 22–32)
TCO2: 23 mmol/L (ref 22–32)
pCO2 arterial: 47.6 mmHg (ref 32.0–48.0)
pCO2 arterial: 42.1 mmHg (ref 32.0–48.0)
pH, Arterial: 7.275 — ABNORMAL LOW (ref 7.350–7.450)
pH, Arterial: 7.316 — ABNORMAL LOW (ref 7.350–7.450)
pO2, Arterial: 69 mmHg — ABNORMAL LOW (ref 83.0–108.0)
pO2, Arterial: 63 mmHg — ABNORMAL LOW (ref 83.0–108.0)

## 2018-04-02 LAB — CBC
HCT: 25.8 % — ABNORMAL LOW (ref 39.0–52.0)
Hemoglobin: 7.7 g/dL — ABNORMAL LOW (ref 13.0–17.0)
MCH: 27.5 pg (ref 26.0–34.0)
MCHC: 29.8 g/dL — ABNORMAL LOW (ref 30.0–36.0)
MCV: 92.1 fL (ref 80.0–100.0)
Platelets: 181 K/uL (ref 150–400)
RBC: 2.8 MIL/uL — ABNORMAL LOW (ref 4.22–5.81)
RDW: 15 % (ref 11.5–15.5)
WBC: 17.4 K/uL — ABNORMAL HIGH (ref 4.0–10.5)
nRBC: 0.2 % (ref 0.0–0.2)

## 2018-04-02 LAB — GLUCOSE, CAPILLARY
Glucose-Capillary: 168 mg/dL — ABNORMAL HIGH (ref 70–99)
Glucose-Capillary: 182 mg/dL — ABNORMAL HIGH (ref 70–99)
Glucose-Capillary: 183 mg/dL — ABNORMAL HIGH (ref 70–99)
Glucose-Capillary: 197 mg/dL — ABNORMAL HIGH (ref 70–99)
Glucose-Capillary: 201 mg/dL — ABNORMAL HIGH (ref 70–99)
Glucose-Capillary: 221 mg/dL — ABNORMAL HIGH (ref 70–99)
Glucose-Capillary: 222 mg/dL — ABNORMAL HIGH (ref 70–99)

## 2018-04-02 LAB — RENAL FUNCTION PANEL
Albumin: 3.5 g/dL (ref 3.5–5.0)
Anion gap: 13 (ref 5–15)
BUN: 27 mg/dL — ABNORMAL HIGH (ref 8–23)
CO2: 21 mmol/L — AB (ref 22–32)
Calcium: 8.6 mg/dL — ABNORMAL LOW (ref 8.9–10.3)
Chloride: 97 mmol/L — ABNORMAL LOW (ref 98–111)
Creatinine, Ser: 2.87 mg/dL — ABNORMAL HIGH (ref 0.61–1.24)
GFR calc Af Amer: 24 mL/min — ABNORMAL LOW (ref >60)
GFR calc non Af Amer: 20 mL/min — ABNORMAL LOW (ref >60)
GLUCOSE: 243 mg/dL — AB (ref 70–99)
Phosphorus: 5.3 mg/dL — ABNORMAL HIGH (ref 2.5–4.6)
Potassium: 4.7 mmol/L (ref 3.5–5.1)
SODIUM: 131 mmol/L — AB (ref 135–145)

## 2018-04-02 MED ORDER — INSULIN DETEMIR 100 UNIT/ML ~~LOC~~ SOLN
22.0000 [IU] | Freq: Two times a day (BID) | SUBCUTANEOUS | Status: DC
Start: 2018-04-02 — End: 2018-04-02
  Administered 2018-04-02: 22 [IU] via SUBCUTANEOUS
  Filled 2018-04-02 (×2): qty 0.22

## 2018-04-02 MED ORDER — LIDOCAINE HCL (PF) 1 % IJ SOLN
INTRAMUSCULAR | Status: DC
Start: 2018-04-02 — End: 2018-04-02
  Filled 2018-04-02: qty 30

## 2018-04-02 MED ORDER — FUROSEMIDE 10 MG/ML IJ SOLN
80.0000 mg | Freq: Every day | INTRAMUSCULAR | Status: DC
Start: 2018-04-02 — End: 2018-04-02
  Administered 2018-04-02: 80 mg via INTRAVENOUS
  Filled 2018-04-02: qty 8

## 2018-04-02 MED ORDER — LIDOCAINE HCL (PF) 1 % IJ SOLN
INTRAMUSCULAR | Status: AC
Start: 2018-04-02 — End: 2018-04-02
  Administered 2018-04-02: 5 mL
  Filled 2018-04-02: qty 5

## 2018-04-02 MED ORDER — INSULIN DETEMIR 100 UNIT/ML ~~LOC~~ SOLN
25.0000 [IU] | Freq: Two times a day (BID) | SUBCUTANEOUS | Status: DC
Start: 2018-04-02 — End: 2018-04-03
  Administered 2018-04-02: 25 [IU] via SUBCUTANEOUS
  Filled 2018-04-02 (×2): qty 0.25

## 2018-04-02 MED ORDER — NA FERRIC GLUC CPLX IN SUCROSE 12.5 MG/ML IV SOLN
125.0000 mg | Freq: Once | INTRAVENOUS | Status: AC
Start: 2018-04-02 — End: 2018-04-02
  Administered 2018-04-02: 125 mg via INTRAVENOUS
  Filled 2018-04-02: qty 10

## 2018-04-02 MED ORDER — AMIODARONE HCL IN DEXTROSE 360-4.14 MG/200ML-% IV SOLN
60.0000 mg/h | INTRAVENOUS | Status: AC
Start: 2018-04-02 — End: 2018-04-02
  Administered 2018-04-02 (×2): 60 mg/h via INTRAVENOUS
  Filled 2018-04-02 (×2): qty 200

## 2018-04-02 MED ORDER — AMIODARONE HCL IN DEXTROSE 360-4.14 MG/200ML-% IV SOLN
30.0000 mg/h | INTRAVENOUS | Status: AC
Start: 2018-04-02 — End: 2018-04-06
  Administered 2018-04-03 – 2018-04-06 (×8): 30 mg/h via INTRAVENOUS
  Filled 2018-04-02 (×8): qty 200

## 2018-04-02 MED ORDER — DOPAMINE-DEXTROSE 3.2-5 MG/ML-% IV SOLN
2.0000 ug/kg/min | INTRAVENOUS | Status: DC
Start: 2018-04-02 — End: 2018-04-04
  Administered 2018-04-02: 2 ug/kg/min via INTRAVENOUS
  Filled 2018-04-02: qty 250

## 2018-04-02 MED ORDER — DEXTROSE 5 % IV SOLN
120.0000 mg | Freq: Once | INTRAVENOUS | Status: AC
Start: 2018-04-02 — End: 2018-04-02
  Administered 2018-04-02: 120 mg via INTRAVENOUS
  Filled 2018-04-02: qty 10

## 2018-04-02 MED ORDER — ENOXAPARIN SODIUM 30 MG/0.3ML ~~LOC~~ SOLN
30.0000 mg | SUBCUTANEOUS | Status: DC
Start: 2018-04-02 — End: 2018-04-04
  Administered 2018-04-02 – 2018-04-03 (×2): 30 mg via SUBCUTANEOUS
  Filled 2018-04-02 (×2): qty 0.3

## 2018-04-02 MED ORDER — SODIUM CHLORIDE 0.9 % IV SOLN
INTRAVENOUS | Status: DC | PRN
Start: 2018-04-02 — End: 2018-04-15
  Administered 2018-04-02: 1000 mL via INTRAVENOUS

## 2018-04-02 MED ORDER — MILRINONE LACTATE IN DEXTROSE 20-5 MG/100ML-% IV SOLN
0.3000 ug/kg/min | INTRAVENOUS | Status: DC
Start: 2018-04-02 — End: 2018-04-02

## 2018-04-02 MED ORDER — LEVALBUTEROL HCL 0.63 MG/3ML IN NEBU
0.6300 mg | Freq: Three times a day (TID) | RESPIRATORY_TRACT | Status: DC
Start: 2018-04-02 — End: 2018-04-06
  Administered 2018-04-02 – 2018-04-06 (×13): 0.63 mg via RESPIRATORY_TRACT
  Filled 2018-04-02 (×13): qty 3

## 2018-04-02 MED ORDER — MILRINONE LACTATE IN DEXTROSE 20-5 MG/100ML-% IV SOLN
0.1250 ug/kg/min | INTRAVENOUS | Status: DC
Start: 2018-04-02 — End: 2018-04-07
  Administered 2018-04-02 – 2018-04-04 (×6): 0.25 ug/kg/min via INTRAVENOUS
  Administered 2018-04-05: 0.125 ug/kg/min via INTRAVENOUS
  Administered 2018-04-05: 0.25 ug/kg/min via INTRAVENOUS
  Administered 2018-04-06: 0.125 ug/kg/min via INTRAVENOUS
  Filled 2018-04-02 (×10): qty 100

## 2018-04-02 NOTE — Plan of Care (Signed)
  Problem: Coping: Goal: Level of anxiety will decrease Outcome: Progressing   Problem: Pain Managment: Goal: General experience of comfort will improve Outcome: Progressing   Problem: Skin Integrity: Goal: Wound healing without signs and symptoms of infection Outcome: Progressing   Problem: Clinical Measurements: Goal: Respiratory complications will improve Outcome: Not Progressing

## 2018-04-02 NOTE — Progress Notes (Signed)
Dr Donata ClayVan Trigt called and patient status discussed regarding low UO, rhythm change, and respiratory status. New orders placed.

## 2018-04-02 NOTE — Progress Notes (Addendum)
TCTS DAILY ICU PROGRESS NOTE                   [address]            [place] [address]          [phone]   2 Days Post-Op Procedure(s) (LRB): CORONARY ARTERY BYPASS GRAFTING (CABG) TIMES FOUR USING LEFT INTERNAL MAMMARY ARTERY AND RIGHT GREATER SAPHENOUS VEIN HARVESTED ENDOSCOPICALLY. (N/A) TRANSESOPHAGEAL ECHOCARDIOGRAM (TEE) (N/A)  Total Length of Stay:  LOS: 2 days   Subjective: Patient siting in chair. Not having a good morning-  Objective: Vital signs in last 24 hours: Temp:  [97.7 F (36.5 C)-100.2 F (37.9 C)] 97.8 F (36.6 C) (12/04 0300) Pulse Rate:  [86-111] 95 (12/04 0700) Cardiac Rhythm: Atrial paced (12/04 0720) Resp:  [7-25] 15 (12/04 0700) BP: (94-153)/(46-115) 132/69 (12/04 0700) SpO2:  [87 %-97 %] 92 % (12/04 0700) Arterial Line BP: (137-190)/(45-63) 150/50 (12/03 1300) FiO2 (%):  [50 %-91 %] 91 % (12/03 1726) Weight:  [135.4 kg] 135.4 kg (12/04 0447)  Filed Weights   04/01/18 0450 04/02/18 0447  Weight: (!) 137.4 kg 135.4 kg    Weight change: -2.001 kg   Hemodynamic parameters for last 24 hours: PAP: (26-37)/(14-17) 37/15  Intake/Output from previous day: 12/03 0701 - 12/04 0700 In: 2053 [P.O.:1080; I.V.:572.8; IV Piggyback:400.1] Out: 1660 [Urine:1220; Chest Tube:440]  Intake/Output this shift: No intake/output data recorded.  Current Meds: Scheduled Meds: . acetaminophen  1,000 mg Oral Q6H   Or  . acetaminophen (TYLENOL) oral liquid 160 mg/5 mL  1,000 mg Per Tube Q6H  . aspirin EC  325 mg Oral Daily   Or  . aspirin  324 mg Per Tube Daily  . bisacodyl  10 mg Oral Daily   Or  . bisacodyl  10 mg Rectal Daily  . Chlorhexidine Gluconate Cloth  6 each Topical Daily  . docusate sodium  200 mg Oral Daily  . ferrous fumarate-b12-vitamic C-folic acid  1 capsule Oral Q breakfast  . finasteride  5 mg Oral Daily  . insulin aspart  0-24 Units Subcutaneous Q4H  . insulin aspart  6 Units Subcutaneous TID WC  . insulin  detemir  20 Units Subcutaneous BID  . loratadine  10 mg Oral Daily  . mouth rinse  15 mL Mouth Rinse BID  . metoCLOPramide (REGLAN) injection  10 mg Intravenous Q6H  . metoprolol tartrate  12.5 mg Oral BID   Or  . metoprolol tartrate  12.5 mg Per Tube BID  . pantoprazole  40 mg Oral Daily  . pravastatin  40 mg Oral QHS  . sodium chloride flush  10-40 mL Intracatheter Q12H  . sodium chloride flush  3 mL Intravenous Q12H   Continuous Infusions: . sodium chloride Stopped (04/01/18 0934)  . sodium chloride    . sodium chloride Stopped (04/01/18 0700)  . furosemide (LASIX) infusion 10 mg/hr (04/02/18 0700)  . insulin Stopped (04/01/18 1223)  . lactated ringers    . lactated ringers Stopped (04/02/18 0508)  . milrinone 0.125 mcg/kg/min (04/02/18 0700)  . nitroGLYCERIN Stopped (03/31/18 1500)  . norepinephrine (LEVOPHED) Adult infusion 4 mcg/min (04/01/18 0229)   PRN Meds:.sodium chloride, metoprolol tartrate, midazolam, morphine injection, ondansetron (ZOFRAN) IV, oxyCODONE, sodium chloride flush, sodium chloride flush, traMADol  General appearance: cooperative and no distress Neurologic: intact Heart: RRR Lungs: Diminished at bases Abdomen: Soft, non tender, obese, bowel sounds present Extremities: ++ bilateral LE edema Wounds: Dressings intact  Lab Results:  CBC: Recent Labs    04/01/18 1557 04/01/18 1600 04/02/18 0514  WBC 18.0*  --  17.4*  HGB 8.5* 9.2* 7.7*  HCT 27.0* 27.0* 25.8*  PLT 160  --  181   BMET:  Recent Labs    04/01/18 0225  04/01/18 1600 04/02/18 0514  NA 136  --  136 136  K 4.7  --  4.3 5.0  CL 106  --  102 102  CO2 21*  --   --  22  GLUCOSE 106*  --  218* 205*  BUN 12  --  15 21  CREATININE 1.34*   < > 1.30* 2.29*  CALCIUM 8.2*  --   --  8.7*   < > = values in this interval not displayed.    CMET: Lab Results  Component Value Date   WBC 17.4 (H) 04/02/2018   HGB 7.7 (L) 04/02/2018   HCT 25.8 (L) 04/02/2018   PLT 181 04/02/2018   GLUCOSE  205 (H) 04/02/2018   ALT 37 04/02/2018   AST 46 (H) 04/02/2018   NA 136 04/02/2018   K 5.0 04/02/2018   CL 102 04/02/2018   CREATININE 2.29 (H) 04/02/2018   BUN 21 04/02/2018   CO2 22 04/02/2018   TSH 3.640 02/25/2018   INR 1.37 03/31/2018   HGBA1C 7.1 (H) 03/14/2018      PT/INR:  Recent Labs    03/31/18 1458  LABPROT 16.7*  INR 1.37   Radiology: No results found.  Assessment/Plan: S/P Procedure(s) (LRB): CORONARY ARTERY BYPASS GRAFTING (CABG) TIMES FOUR USING LEFT INTERNAL MAMMARY ARTERY AND RIGHT GREATER SAPHENOUS VEIN HARVESTED ENDOSCOPICALLY. (N/A) TRANSESOPHAGEAL ECHOCARDIOGRAM (TEE) (N/A)  1. CV-A paced.  On Lopressor 12.5 mg bid. On Milrinone and  Norepinephrine drips. Co ox this am 53.1 so will increase Milrinone drip. 2. Pulmonary-Chest tubes with 140 cc since last 12 hours. CXR this am appears to show low lung volumes, bibasilar atelectasis and small pleural effusions. Per Dr. Donata ClayVan Price, remove chest tubes. On 10 L of HFNC this am (down from inital 15 L post extubation) 3. Volume overload-on Lasix drip 4. ABL anemia-H and H this am decreased to 7.7 and 25.8. Will start oral Trinsicon and give one dose of Ferric gluconate. Monitor need for transfusion 5. DM-CBGs 187/168/182. On Insulin but will increase for better glucose control. Was on Metformin prior to admission and will restart once tolerating oral and creatinine improved. Pre op HGA1C 7.1 6. Creatinine up to 2.29 this am-will stop Lasix drip. Will check renal panel later this afternoon    Calvin Margaretann LovelessM Zimmerman PA-C 04/02/2018 7:53 AM   Acute postop oliguric renal failure in a elderly morbidly obese diabetic status post CABG.  Will start renal dose dopamine and continue milrinone and challenge with 80 mg IV Lasix 1 time.  Follow-up potassium and creatinine with p.m. labs today.  Patient's pulmonary status improved now down to 10 L high flow nasal cannula.  Chest x-ray without effusion but low lung volumes.   Patient ambulating in hallway.  Maintaining sinus rhythm now a paced with satisfactory blood pressure.  patient examined and medical record reviewed,agree with above note. Kathlee Nationseter Van Price III 04/02/2018

## 2018-04-02 NOTE — Progress Notes (Signed)
Patient ID: [name], male   DOB: 1942/09/04, 72 y.o.   MRN: [id number] EVENING ROUNDS NOTE :     [address]       ,[place] [address]             [phone]                 2 Days Post-Op Procedure(s) (LRB): CORONARY ARTERY BYPASS GRAFTING (CABG) TIMES FOUR USING LEFT INTERNAL MAMMARY ARTERY AND RIGHT GREATER SAPHENOUS VEIN HARVESTED ENDOSCOPICALLY. (N/A) TRANSESOPHAGEAL ECHOCARDIOGRAM (TEE) (N/A)  Total Length of Stay:  LOS: 2 days  BP 125/66   Pulse (!) 102   Temp 98 F (36.7 C) (Oral)   Resp 18   Ht '5\' 11"'$  (1.803 m)   Wt 135.4 kg   SpO2 92%   BMI 41.63 kg/m   .Intake/Output      12/03 0701 - 12/04 0700 12/04 0701 - 12/05 0700   P.O. 1080 960   I.V. (mL/kg) 582.8 (4.3) 421.3 (3.1)   Blood     IV Piggyback 400.1 109.6   Total Intake(mL/kg) 2063 (15.2) 1490.9 (11)   Urine (mL/kg/hr) 1220 (0.4) 202 (0.1)   Emesis/NG output 0    Blood     Chest Tube 440 0   Total Output 1660 202   Net +403 +1288.9          . sodium chloride Stopped (04/01/18 0934)  . sodium chloride    . sodium chloride Stopped (04/01/18 0700)  . sodium chloride 10 mL/hr at 04/02/18 1800  . amiodarone 60 mg/hr (04/02/18 1800)  . amiodarone    . DOPamine 2 mcg/kg/min (04/02/18 1800)  . furosemide 120 mg (04/02/18 1829)  . lactated ringers    . lactated ringers Stopped (04/02/18 1720)  . milrinone 0.25 mcg/kg/min (04/02/18 1800)     Lab Results  Component Value Date   WBC 17.4 (H) 04/02/2018   HGB 7.5 (L) 04/02/2018   HCT 24.6 (L) 04/02/2018   PLT 181 04/02/2018   GLUCOSE 243 (H) 04/02/2018   ALT 37 04/02/2018   AST 46 (H) 04/02/2018   NA 131 (L) 04/02/2018   K 4.7 04/02/2018   CL 97 (L) 04/02/2018   CREATININE 2.87 (H) 04/02/2018   BUN 27 (H) 04/02/2018   CO2 21 (L) 04/02/2018   TSH 3.640 02/25/2018   INR 1.37 03/31/2018   HGBA1C 7.1 (H) 03/14/2018   uop - 200 ml for 12 hours  Getting lasix 120 mg x1 afib started today on cordrone  Acute Kidney  Injury (any one)  Increase in SCr by > 0.3 within 48 hours  Increase SCr to > 1.5 times baseline  Urine volume < 0.5 ml/kg/h for 6 hrs  ?Stage 1 - Increase in serum creatinine to 1.5 to 1.9 times baseline, or increase in serum creatinine by ?0.3 mg/dL (?26.5 micromol/L), or reduction in urine output to <0.5 mL/kg per hour for 6 to 12 hours.  ?Stage 2 - Increase in serum creatinine to 2.0 to 2.9 times baseline, or reduction in urine output to <0.5 mL/kg per hour for ?12 hours.  ?Stage 3 - Increase in serum creatinine to 3.0 times baseline, or increase in serum creatinine to ?4.0 mg/dL (?353.6 micromol/L), or reduction in urine output to <0.3 mL/kg per hour for ?24 hours, or anuria for ?12 hours, or the initiation of renal replacement therapy, or, in patients <18 years, decrease in eGFR to <35 mL   Lab Results  Component Value  Date   CREATININE 2.87 (H) 04/02/2018   Estimated Creatinine Clearance: 30.8 mL/min (A) (by C-G formula based on SCr of 2.87 mg/dL (H)).    Grace Isaac MD  Beeper [phone] Office [phone] 04/02/2018 6:38 PM

## 2018-04-02 NOTE — Progress Notes (Signed)
As per instructed by Dr. Donata ClayVan Trigt, in a sterile fashion (betadine swabs, sterile towels) I placed a port extension into the existing right IJ sheath (drips were paused). Ports flushed without difficulty. After using 1% lidocaine, I used a 0 Silk to secure device to upper neck/under side of chin. Patient tolerated procedure well.

## 2018-04-03 ENCOUNTER — Inpatient Hospital Stay (HOSPITAL_COMMUNITY): Payer: Medicare HMO

## 2018-04-03 ENCOUNTER — Encounter (HOSPITAL_COMMUNITY): Payer: Self-pay | Admitting: *Deleted

## 2018-04-03 ENCOUNTER — Encounter

## 2018-04-03 LAB — COMPREHENSIVE METABOLIC PANEL
ALT: 30 U/L (ref 0–44)
AST: 38 U/L (ref 15–41)
Albumin: 3.2 g/dL — ABNORMAL LOW (ref 3.5–5.0)
Alkaline Phosphatase: 34 U/L — ABNORMAL LOW (ref 38–126)
Anion gap: 12 (ref 5–15)
BUN: 33 mg/dL — ABNORMAL HIGH (ref 8–23)
CO2: 23 mmol/L (ref 22–32)
Calcium: 8.6 mg/dL — ABNORMAL LOW (ref 8.9–10.3)
Chloride: 96 mmol/L — ABNORMAL LOW (ref 98–111)
Creatinine, Ser: 2.99 mg/dL — ABNORMAL HIGH (ref 0.61–1.24)
GFR calc Af Amer: 22 mL/min — ABNORMAL LOW (ref >60)
GFR calc non Af Amer: 19 mL/min — ABNORMAL LOW (ref >60)
Glucose, Bld: 187 mg/dL — ABNORMAL HIGH (ref 70–99)
Potassium: 4 mmol/L (ref 3.5–5.1)
Sodium: 131 mmol/L — ABNORMAL LOW (ref 135–145)
Total Bilirubin: 0.8 mg/dL (ref 0.3–1.2)
Total Protein: 5.6 g/dL — ABNORMAL LOW (ref 6.5–8.1)

## 2018-04-03 LAB — CBC
HCT: 22.5 % — ABNORMAL LOW (ref 39.0–52.0)
Hemoglobin: 7 g/dL — ABNORMAL LOW (ref 13.0–17.0)
MCH: 27.7 pg (ref 26.0–34.0)
MCHC: 31.1 g/dL (ref 30.0–36.0)
MCV: 88.9 fL (ref 80.0–100.0)
Platelets: 156 K/uL (ref 150–400)
RBC: 2.53 MIL/uL — ABNORMAL LOW (ref 4.22–5.81)
RDW: 14.7 % (ref 11.5–15.5)
WBC: 12.4 K/uL — ABNORMAL HIGH (ref 4.0–10.5)
nRBC: 0.7 % — ABNORMAL HIGH (ref 0.0–0.2)

## 2018-04-03 LAB — TYPE AND SCREEN
ABO/RH(D): O POS
Antibody Screen: NEGATIVE
Unit division: 0
Unit division: 0

## 2018-04-03 LAB — GLUCOSE, CAPILLARY
Glucose-Capillary: 200 mg/dL — ABNORMAL HIGH (ref 70–99)
Glucose-Capillary: 166 mg/dL — ABNORMAL HIGH (ref 70–99)
Glucose-Capillary: 181 mg/dL — ABNORMAL HIGH (ref 70–99)
Glucose-Capillary: 196 mg/dL — ABNORMAL HIGH (ref 70–99)
Glucose-Capillary: 182 mg/dL — ABNORMAL HIGH (ref 70–99)
Glucose-Capillary: 116 mg/dL — ABNORMAL HIGH (ref 70–99)
Glucose-Capillary: 212 mg/dL — ABNORMAL HIGH (ref 70–99)

## 2018-04-03 LAB — COOXEMETRY PANEL
Carboxyhemoglobin: 1.1 % (ref 0.5–1.5)
Methemoglobin: 1.9 % — ABNORMAL HIGH (ref 0.0–1.5)
O2 SAT: 60.7 %
Total hemoglobin: 7.3 g/dL — ABNORMAL LOW (ref 12.0–16.0)

## 2018-04-03 LAB — BPAM RBC
Blood Product Expiration Date: 201912302359
Blood Product Expiration Date: 201912302359
ISSUE DATE / TIME: 201912020902
ISSUE DATE / TIME: 201912022130
Unit Type and Rh: 5100
Unit Type and Rh: 5100

## 2018-04-03 LAB — RENAL FUNCTION PANEL
Albumin: 2.9 g/dL — ABNORMAL LOW (ref 3.5–5.0)
Anion gap: 12 (ref 5–15)
BUN: 38 mg/dL — ABNORMAL HIGH (ref 8–23)
CO2: 22 mmol/L (ref 22–32)
Calcium: 8.5 mg/dL — ABNORMAL LOW (ref 8.9–10.3)
Chloride: 97 mmol/L — ABNORMAL LOW (ref 98–111)
Creatinine, Ser: 2.88 mg/dL — ABNORMAL HIGH (ref 0.61–1.24)
GFR calc Af Amer: 23 mL/min — ABNORMAL LOW (ref >60)
GFR calc non Af Amer: 20 mL/min — ABNORMAL LOW (ref >60)
Glucose, Bld: 184 mg/dL — ABNORMAL HIGH (ref 70–99)
Phosphorus: 4.4 mg/dL (ref 2.5–4.6)
Potassium: 3.9 mmol/L (ref 3.5–5.1)
Sodium: 131 mmol/L — ABNORMAL LOW (ref 135–145)

## 2018-04-03 LAB — PREPARE RBC (CROSSMATCH)

## 2018-04-03 MED ORDER — SORBITOL 70 % PO SOLN
60.0000 mL | Freq: Once | ORAL | Status: AC
Start: 2018-04-03 — End: 2018-04-03
  Administered 2018-04-03: 60 mL via ORAL
  Filled 2018-04-03: qty 60

## 2018-04-03 MED ORDER — FUROSEMIDE 10 MG/ML IJ SOLN
80.0000 mg | Freq: Once | INTRAMUSCULAR | Status: AC
Start: 2018-04-03 — End: 2018-04-03
  Administered 2018-04-03: 80 mg via INTRAVENOUS
  Filled 2018-04-03: qty 8

## 2018-04-03 MED ORDER — INSULIN DETEMIR 100 UNIT/ML ~~LOC~~ SOLN
27.0000 [IU] | Freq: Two times a day (BID) | SUBCUTANEOUS | Status: DC
Start: 2018-04-03 — End: 2018-04-04
  Administered 2018-04-03 (×2): 27 [IU] via SUBCUTANEOUS
  Filled 2018-04-03 (×3): qty 0.27

## 2018-04-03 NOTE — Plan of Care (Deleted)
Repeat CBC hgb 8.5 Dr. Tyrone SageGerhardt aware

## 2018-04-03 NOTE — Plan of Care (Signed)
  Problem: Clinical Measurements: Goal: Ability to maintain clinical measurements within normal limits will improve Outcome: Progressing Goal: Respiratory complications will improve Outcome: Progressing Goal: Cardiovascular complication will be avoided Outcome: Progressing   Problem: Activity: Goal: Risk for activity intolerance will decrease Outcome: Progressing   Problem: Nutrition: Goal: Adequate nutrition will be maintained Outcome: Progressing   

## 2018-04-03 NOTE — Progress Notes (Addendum)
TCTS DAILY ICU PROGRESS NOTE                   [address]            [place] [id number]          [phone]   3 Days Post-Op Procedure(s) (LRB): CORONARY ARTERY BYPASS GRAFTING (CABG) TIMES FOUR USING LEFT INTERNAL MAMMARY ARTERY AND RIGHT GREATER SAPHENOUS VEIN HARVESTED ENDOSCOPICALLY. (N/A) TRANSESOPHAGEAL ECHOCARDIOGRAM (TEE) (N/A)  Total Length of Stay:  LOS: 3 days   Subjective: Patient already walked this am and took a good nap.   Objective: Vital signs in last 24 hours: Temp:  [98 F (36.7 C)-98.3 F (36.8 C)] 98.2 F (36.8 C) (12/05 0400) Pulse Rate:  [91-113] 111 (12/05 0700) Cardiac Rhythm: Atrial fibrillation (12/05 0400) Resp:  [8-24] 8 (12/05 0700) BP: (104-156)/(54-132) 129/74 (12/05 0700) SpO2:  [89 %-97 %] 97 % (12/05 0700) Weight:  [138.2 kg] 138.2 kg (12/05 0437)  Filed Weights   04/01/18 0450 04/02/18 0447 04/03/18 0437  Weight: (!) 137.4 kg 135.4 kg (!) 138.2 kg    Weight change: 2.801 kg   Hemodynamic parameters for last 24 hours:    Intake/Output from previous day: 12/04 0701 - 12/05 0700 In: 2499.6 [P.O.:1197; I.V.:1131; IV Piggyback:171.6] Out: 755 [Urine:755]  Intake/Output this shift: No intake/output data recorded.  Current Meds: Scheduled Meds: . acetaminophen  1,000 mg Oral Q6H   Or  . acetaminophen (TYLENOL) oral liquid 160 mg/5 mL  1,000 mg Per Tube Q6H  . aspirin EC  325 mg Oral Daily   Or  . aspirin  324 mg Per Tube Daily  . bisacodyl  10 mg Oral Daily   Or  . bisacodyl  10 mg Rectal Daily  . Chlorhexidine Gluconate Cloth  6 each Topical Daily  . docusate sodium  200 mg Oral Daily  . enoxaparin (LOVENOX) injection  30 mg Subcutaneous Q24H  . ferrous fumarate-b12-vitamic C-folic acid  1 capsule Oral Q breakfast  . furosemide  80 mg Intravenous Once  . insulin aspart  0-24 Units Subcutaneous Q4H  . insulin aspart  6 Units Subcutaneous TID WC  . insulin detemir  25 Units Subcutaneous BID  .  levalbuterol  0.63 mg Nebulization TID  . loratadine  10 mg Oral Daily  . mouth rinse  15 mL Mouth Rinse BID  . metoCLOPramide (REGLAN) injection  10 mg Intravenous Q6H  . pantoprazole  40 mg Oral Daily  . pravastatin  40 mg Oral QHS  . sodium chloride flush  10-40 mL Intracatheter Q12H  . sodium chloride flush  3 mL Intravenous Q12H   Continuous Infusions: . sodium chloride Stopped (04/01/18 0934)  . sodium chloride    . sodium chloride Stopped (04/01/18 0700)  . sodium chloride Stopped (04/03/18 0631)  . amiodarone 30 mg/hr (04/03/18 0700)  . DOPamine 2 mcg/kg/min (04/03/18 0700)  . lactated ringers    . lactated ringers Stopped (04/02/18 1720)  . milrinone 0.25 mcg/kg/min (04/03/18 0700)   PRN Meds:.sodium chloride, sodium chloride, metoprolol tartrate, morphine injection, ondansetron (ZOFRAN) IV, oxyCODONE, sodium chloride flush, sodium chloride flush  General appearance: cooperative and no distress Neurologic: intact Heart: Tachycardic Lungs: Slightly diminished at bases Abdomen: Soft, non tender, obese, bowel sounds present Extremities: SCDs in place Wounds: Dressings intact  Lab Results: CBC: Recent Labs    04/02/18 0514 04/02/18 1536 04/03/18 0420  WBC 17.4*  --  12.4*  HGB 7.7* 7.5* 7.0*  HCT 25.8*  24.6* 22.5*  PLT 181  --  156   BMET:  Recent Labs    04/02/18 1535 04/03/18 0420  NA 131* 131*  K 4.7 4.0  CL 97* 96*  CO2 21* 23  GLUCOSE 243* 187*  BUN 27* 33*  CREATININE 2.87* 2.99*  CALCIUM 8.6* 8.6*    CMET: Lab Results  Component Value Date   WBC 12.4 (H) 04/03/2018   HGB 7.0 (L) 04/03/2018   HCT 22.5 (L) 04/03/2018   PLT 156 04/03/2018   GLUCOSE 187 (H) 04/03/2018   ALT 30 04/03/2018   AST 38 04/03/2018   NA 131 (L) 04/03/2018   K 4.0 04/03/2018   CL 96 (L) 04/03/2018   CREATININE 2.99 (H) 04/03/2018   BUN 33 (H) 04/03/2018   CO2 23 04/03/2018   TSH 3.640 02/25/2018   INR 1.37 03/31/2018   HGBA1C 7.1 (H) 03/14/2018       PT/INR:  Recent Labs    03/31/18 1458  LABPROT 16.7*  INR 1.37   Radiology: No results found.  Assessment/Plan: S/P Procedure(s) (LRB): CORONARY ARTERY BYPASS GRAFTING (CABG) TIMES FOUR USING LEFT INTERNAL MAMMARY ARTERY AND RIGHT GREATER SAPHENOUS VEIN HARVESTED ENDOSCOPICALLY. (N/A) TRANSESOPHAGEAL ECHOCARDIOGRAM (TEE) (N/A)  1. CV-Previous a fib. ST in the 110's this am. On Lopressor 12.5 mg bid. On Milrinone, Dopamine, and Amiodarone drips. Co ox this am increased to 60.7. Will discuss with Dr. Donata Clay transition to oral Amiodarone 2. Pulmonary- CXR this am appears similar to yesterday's ( low lung volumes, bibasilar atelectasis and small pleural effusions.)  Remains on 10 L of HFNC this am (down from inital 15 L post extubation) 3. Volume overload-on Lasix drip 4. ABL anemia-H and H this am decreased to 7 and 22.5. Likely transfuse. Continue Trinsicon and give one dose of Ferric gluconate.  5. DM-CBGs 200/166/181. On Insulin but will increase for better glucose control. Was on Metformin prior to admission and will restart once tolerating oral and creatinine improved. Pre op HGA1C 7.1 6. AKI-Creatinine up to 2.99 this am 7. Hyponatremia-sodium 131. Likely related to diuresis   Donielle Margaretann Loveless PA-C 04/03/2018 7:42 AM   Transfuse 1 unit of packed cells for expected postop blood loss anemia Urine output improving, creatinine has plateaued Patient in sinus rhythm, continue IV amiodarone another 24 hours then transition to oral dosing Chest x-ray remains clear patient examined and medical record reviewed,agree with above note. Kathlee Nations Trigt III 04/03/2018

## 2018-04-03 NOTE — Progress Notes (Signed)
TCTS BRIEF SICU PROGRESS NOTE  3 Days Post-Op  S/P Procedure(s) (LRB): CORONARY ARTERY BYPASS GRAFTING (CABG) TIMES FOUR USING LEFT INTERNAL MAMMARY ARTERY AND RIGHT GREATER SAPHENOUS VEIN HARVESTED ENDOSCOPICALLY. (N/A) TRANSESOPHAGEAL ECHOCARDIOGRAM (TEE) (N/A)   Stable day Afib w/ controlled rate, BP stable Breathing comfortably on HFNC UOP adequate  Plan: Continue current plan  Purcell Nailslarence H Velvet Moomaw, MD 04/03/2018 6:33 PM

## 2018-04-03 NOTE — Progress Notes (Signed)
Pt refuses to wear Bipap for tonight. Pt is on 5L [place] and Sp02 is 100%. No AD noted.

## 2018-04-03 NOTE — Progress Notes (Signed)
Patient refused BIPAP for tonight. 
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
## 2018-04-04 ENCOUNTER — Inpatient Hospital Stay (HOSPITAL_COMMUNITY): Payer: Medicare HMO

## 2018-04-04 ENCOUNTER — Encounter

## 2018-04-04 LAB — COMPREHENSIVE METABOLIC PANEL
ALT: 31 U/L (ref 0–44)
AST: 31 U/L (ref 15–41)
Albumin: 2.9 g/dL — ABNORMAL LOW (ref 3.5–5.0)
Alkaline Phosphatase: 36 U/L — ABNORMAL LOW (ref 38–126)
Anion gap: 12 (ref 5–15)
BUN: 43 mg/dL — ABNORMAL HIGH (ref 8–23)
CO2: 21 mmol/L — ABNORMAL LOW (ref 22–32)
Calcium: 8.7 mg/dL — ABNORMAL LOW (ref 8.9–10.3)
Chloride: 98 mmol/L (ref 98–111)
Creatinine, Ser: 2.91 mg/dL — ABNORMAL HIGH (ref 0.61–1.24)
GFR calc Af Amer: 23 mL/min — ABNORMAL LOW (ref >60)
GFR calc non Af Amer: 20 mL/min — ABNORMAL LOW (ref >60)
Glucose, Bld: 168 mg/dL — ABNORMAL HIGH (ref 70–99)
Potassium: 3.5 mmol/L (ref 3.5–5.1)
Sodium: 131 mmol/L — ABNORMAL LOW (ref 135–145)
Total Bilirubin: 1 mg/dL (ref 0.3–1.2)
Total Protein: 5.5 g/dL — ABNORMAL LOW (ref 6.5–8.1)

## 2018-04-04 LAB — GLUCOSE, CAPILLARY
Glucose-Capillary: 157 mg/dL — ABNORMAL HIGH (ref 70–99)
Glucose-Capillary: 163 mg/dL — ABNORMAL HIGH (ref 70–99)
Glucose-Capillary: 166 mg/dL — ABNORMAL HIGH (ref 70–99)
Glucose-Capillary: 168 mg/dL — ABNORMAL HIGH (ref 70–99)
Glucose-Capillary: 183 mg/dL — ABNORMAL HIGH (ref 70–99)
Glucose-Capillary: 191 mg/dL — ABNORMAL HIGH (ref 70–99)

## 2018-04-04 LAB — COOXEMETRY PANEL
Carboxyhemoglobin: 1.5 % (ref 0.5–1.5)
Methemoglobin: 1.9 % — ABNORMAL HIGH (ref 0.0–1.5)
O2 Saturation: 51.6 %
Total hemoglobin: 8.6 g/dL — ABNORMAL LOW (ref 12.0–16.0)

## 2018-04-04 LAB — TYPE AND SCREEN
ABO/RH(D): O POS
Antibody Screen: NEGATIVE
Unit division: 0

## 2018-04-04 LAB — BPAM RBC
Blood Product Expiration Date: 201912062359
ISSUE DATE / TIME: 201912050959
Unit Type and Rh: 9500

## 2018-04-04 LAB — CBC
HCT: 23.6 % — ABNORMAL LOW (ref 39.0–52.0)
Hemoglobin: 7.6 g/dL — ABNORMAL LOW (ref 13.0–17.0)
MCH: 27.3 pg (ref 26.0–34.0)
MCHC: 32.2 g/dL (ref 30.0–36.0)
MCV: 84.9 fL (ref 80.0–100.0)
Platelets: 165 K/uL (ref 150–400)
RBC: 2.78 MIL/uL — ABNORMAL LOW (ref 4.22–5.81)
RDW: 18.2 % — ABNORMAL HIGH (ref 11.5–15.5)
WBC: 9.4 K/uL (ref 4.0–10.5)
nRBC: 1.1 % — ABNORMAL HIGH (ref 0.0–0.2)

## 2018-04-04 MED ORDER — SORBITOL 70 % PO SOLN
60.0000 mL | Freq: Once | ORAL | Status: DC
Start: 2018-04-04 — End: 2018-04-04
  Filled 2018-04-04: qty 60

## 2018-04-04 MED ORDER — POTASSIUM CHLORIDE 10 MEQ/50ML IV SOLN
10.0000 meq | INTRAVENOUS | Status: AC
Start: 2018-04-04 — End: 2018-04-04
  Administered 2018-04-04 (×2): 10 meq via INTRAVENOUS
  Filled 2018-04-04: qty 50

## 2018-04-04 MED ORDER — SORBITOL 70 % SOLN
60.0000 mL | Freq: Once | Status: AC
Start: 2018-04-04 — End: 2018-04-04
  Administered 2018-04-04: 60 mL via ORAL
  Filled 2018-04-04: qty 60

## 2018-04-04 MED ORDER — POTASSIUM CHLORIDE CRYS ER 20 MEQ PO TBCR
20.0000 meq | ORAL | Status: DC | PRN
Start: 2018-04-04 — End: 2018-04-04

## 2018-04-04 MED ORDER — ENOXAPARIN SODIUM 40 MG/0.4ML ~~LOC~~ SOLN
40.0000 mg | SUBCUTANEOUS | Status: DC
Start: 2018-04-04 — End: 2018-04-15
  Administered 2018-04-04 – 2018-04-14 (×11): 40 mg via SUBCUTANEOUS
  Filled 2018-04-04 (×11): qty 0.4

## 2018-04-04 MED ORDER — AMIODARONE IV BOLUS ONLY 150 MG/100ML
150.0000 mg | Freq: Once | INTRAVENOUS | Status: AC
Start: 2018-04-04 — End: 2018-04-04
  Administered 2018-04-04: 150 mg via INTRAVENOUS

## 2018-04-04 MED ORDER — INSULIN DETEMIR 100 UNIT/ML ~~LOC~~ SOLN
30.0000 [IU] | Freq: Two times a day (BID) | SUBCUTANEOUS | Status: DC
Start: 2018-04-04 — End: 2018-04-07
  Administered 2018-04-04 – 2018-04-06 (×6): 30 [IU] via SUBCUTANEOUS
  Filled 2018-04-04 (×9): qty 0.3

## 2018-04-04 NOTE — Progress Notes (Signed)
Pt refusing CPAP at this time. RT will continue to monitor.  

## 2018-04-04 NOTE — Progress Notes (Addendum)
TCTS DAILY ICU PROGRESS NOTE                   [address]            [place] [id number]          [phone]   4 Days Post-Op Procedure(s) (LRB): CORONARY ARTERY BYPASS GRAFTING (CABG) TIMES FOUR USING LEFT INTERNAL MAMMARY ARTERY AND RIGHT GREATER SAPHENOUS VEIN HARVESTED ENDOSCOPICALLY. (N/A) TRANSESOPHAGEAL ECHOCARDIOGRAM (TEE) (N/A)  Total Length of Stay:  LOS: 4 days   Subjective: Patient already walked this am. He is sitting in the chair. Had tiny bowel movement.   Objective: Vital signs in last 24 hours: Temp:  [96.8 F (36 C)-98.6 F (37 C)] 98.6 F (37 C) (12/06 0400) Pulse Rate:  [87-114] 87 (12/06 0700) Cardiac Rhythm: Atrial fibrillation (12/06 0400) Resp:  [7-20] 7 (12/06 0700) BP: (103-154)/(59-101) 131/65 (12/06 0700) SpO2:  [93 %-100 %] 99 % (12/06 0700) Weight:  [137.5 kg] 137.5 kg (12/06 0500)  Filed Weights   04/02/18 0447 04/03/18 0437 04/04/18 0500  Weight: 135.4 kg (!) 138.2 kg (!) 137.5 kg    Weight change: -0.7 kg      Intake/Output from previous day: 12/05 0701 - 12/06 0700 In: 1340.9 [P.O.:240; I.V.:690.9; Blood:410] Out: 1047 [Urine:1045; Stool:2]  Intake/Output this shift: No intake/output data recorded.  Current Meds: Scheduled Meds: . acetaminophen  1,000 mg Oral Q6H   Or  . acetaminophen (TYLENOL) oral liquid 160 mg/5 mL  1,000 mg Per Tube Q6H  . aspirin EC  325 mg Oral Daily   Or  . aspirin  324 mg Per Tube Daily  . bisacodyl  10 mg Oral Daily   Or  . bisacodyl  10 mg Rectal Daily  . Chlorhexidine Gluconate Cloth  6 each Topical Daily  . docusate sodium  200 mg Oral Daily  . enoxaparin (LOVENOX) injection  30 mg Subcutaneous Q24H  . ferrous fumarate-b12-vitamic C-folic acid  1 capsule Oral Q breakfast  . insulin aspart  0-24 Units Subcutaneous Q4H  . insulin aspart  6 Units Subcutaneous TID WC  . insulin detemir  27 Units Subcutaneous BID  . levalbuterol  0.63 mg Nebulization TID  . loratadine  10 mg  Oral Daily  . mouth rinse  15 mL Mouth Rinse BID  . metoCLOPramide (REGLAN) injection  10 mg Intravenous Q6H  . pantoprazole  40 mg Oral Daily  . pravastatin  40 mg Oral QHS  . sodium chloride flush  10-40 mL Intracatheter Q12H  . sodium chloride flush  3 mL Intravenous Q12H   Continuous Infusions: . sodium chloride Stopped (04/01/18 0934)  . sodium chloride    . sodium chloride Stopped (04/01/18 0700)  . sodium chloride Stopped (04/03/18 0631)  . amiodarone 30 mg/hr (04/04/18 0700)  . amiodarone    . DOPamine Stopped (04/03/18 1000)  . lactated ringers    . lactated ringers Stopped (04/02/18 1720)  . milrinone 0.25 mcg/kg/min (04/04/18 0700)   PRN Meds:.sodium chloride, sodium chloride, metoprolol tartrate, morphine injection, ondansetron (ZOFRAN) IV, oxyCODONE, potassium chloride, sodium chloride flush, sodium chloride flush  General appearance: cooperative and no distress Neurologic: intact Heart: IRRR IRRR Lungs: Slightly diminished at bases Abdomen: Soft, non tender, obese, occaiosnal bowel sounds present Extremities: ++ bilateral LE edmea Wounds: Clean and dry  Lab Results: CBC: Recent Labs    04/03/18 0420 04/04/18 0409  WBC 12.4* 9.4  HGB 7.0* 7.6*  HCT 22.5* 23.6*  PLT 156 165  BMET:  Recent Labs    04/03/18 1515 04/04/18 0409  NA 131* 131*  K 3.9 3.5  CL 97* 98  CO2 22 21*  GLUCOSE 184* 168*  BUN 38* 43*  CREATININE 2.88* 2.91*  CALCIUM 8.5* 8.7*    CMET: Lab Results  Component Value Date   WBC 9.4 04/04/2018   HGB 7.6 (L) 04/04/2018   HCT 23.6 (L) 04/04/2018   PLT 165 04/04/2018   GLUCOSE 168 (H) 04/04/2018   ALT 31 04/04/2018   AST 31 04/04/2018   NA 131 (L) 04/04/2018   K 3.5 04/04/2018   CL 98 04/04/2018   CREATININE 2.91 (H) 04/04/2018   BUN 43 (H) 04/04/2018   CO2 21 (L) 04/04/2018   TSH 3.640 02/25/2018   INR 1.37 03/31/2018   HGBA1C 7.1 (H) 03/14/2018      PT/INR:  No results for input(s): LABPROT, INR in the last 72  hours. Radiology: No results found.  Assessment/Plan: S/P Procedure(s) (LRB): CORONARY ARTERY BYPASS GRAFTING (CABG) TIMES FOUR USING LEFT INTERNAL MAMMARY ARTERY AND RIGHT GREATER SAPHENOUS VEIN HARVESTED ENDOSCOPICALLY. (N/A) TRANSESOPHAGEAL ECHOCARDIOGRAM (TEE) (N/A)  1. CV-A fib with CVR this am.  On Milrinone and Amiodarone drips. Co ox this am decreased to 51.6. To be given Amiodarone bolus this am.  2. Pulmonary- CXR this am appears similar to yesterday's  (low lung volumes, bibasilar atelectasis and small pleural effusions.)  Down to  5 L of HFNC this am (down from inital 10 L last few days) 3. Volume overload- 4. ABL anemia-H and H this am decreased to 7.6 and 23.6. S/p transfusion one unit PRBC yesterday. Continue Trinsicon and give one dose of Ferric gluconate.  5. DM-CBGs 212/166/163. On Insulin but will increase for better glucose control. Was on Metformin prior to admission and will restart once tolerating oral and creatinine improved. Pre op HGA1C 7.1 6. AKI-Creatinine slightly increased to 2.91 this am 7. Hyponatremia-sodium 131. Likely related to diuresis 8. Patient still with sleeve-would benefit from PICC but creatinine still elevated. Will discuss with Dr. Donata Clay 9. LOC if no bowel movement today  Donielle Margaretann Loveless PA-C 04/04/2018 7:37 AM   Urine output improving but back in afib[ K 3.4 and supp ordered] Ileus w/o BM- sorbitol ordered walked 300 feet this am Remains edematous  But with creat high will hold diureticsco-ox 60% but will not wean milrinone until creat < 2.0  patient examined and medical record reviewed,agree with above note. Kathlee Nations Trigt III 04/04/2018

## 2018-04-04 NOTE — Progress Notes (Signed)
      [address]       [place] [id number]             [phone]      POD # 4 CABG  Up in chair  BP (!) 149/78 (BP Location: Right Arm)   Pulse 99   Temp (!) 97.5 F (36.4 C) (Oral)   Resp (!) 9   Ht 5\' 11"  (1.803 m)   Wt (!) 137.5 kg   SpO2 100%   BMI 42.28 kg/m   Intake/Output Summary (Last 24 hours) at 04/04/2018 1732 Last data filed at 04/04/2018 1600 Gross per 24 hour  Intake 1296.83 ml  Output 1257 ml  Net 39.83 ml   Still on 4L HFNC 98% sat On amiodarone and milrinone infusions  Continue present plan  Viviann SpareSteven C. Dorris FetchHendrickson, MD [facility] [phone]

## 2018-04-05 ENCOUNTER — Inpatient Hospital Stay (HOSPITAL_COMMUNITY): Payer: Medicare HMO

## 2018-04-05 ENCOUNTER — Encounter

## 2018-04-05 LAB — COMPREHENSIVE METABOLIC PANEL
ALT: 27 U/L (ref 0–44)
AST: 26 U/L (ref 15–41)
Albumin: 2.7 g/dL — ABNORMAL LOW (ref 3.5–5.0)
Alkaline Phosphatase: 38 U/L (ref 38–126)
Anion gap: 10 (ref 5–15)
BUN: 43 mg/dL — ABNORMAL HIGH (ref 8–23)
CO2: 25 mmol/L (ref 22–32)
Calcium: 8.9 mg/dL (ref 8.9–10.3)
Chloride: 96 mmol/L — ABNORMAL LOW (ref 98–111)
Creatinine, Ser: 2.37 mg/dL — ABNORMAL HIGH (ref 0.61–1.24)
GFR calc Af Amer: 30 mL/min — ABNORMAL LOW (ref >60)
GFR calc non Af Amer: 26 mL/min — ABNORMAL LOW (ref >60)
Glucose, Bld: 132 mg/dL — ABNORMAL HIGH (ref 70–99)
Potassium: 3.2 mmol/L — ABNORMAL LOW (ref 3.5–5.1)
Sodium: 131 mmol/L — ABNORMAL LOW (ref 135–145)
Total Bilirubin: 0.8 mg/dL (ref 0.3–1.2)
Total Protein: 5.4 g/dL — ABNORMAL LOW (ref 6.5–8.1)

## 2018-04-05 LAB — CBC
HCT: 24.3 % — ABNORMAL LOW (ref 39.0–52.0)
Hemoglobin: 7.5 g/dL — ABNORMAL LOW (ref 13.0–17.0)
MCH: 26.3 pg (ref 26.0–34.0)
MCHC: 30.9 g/dL (ref 30.0–36.0)
MCV: 85.3 fL (ref 80.0–100.0)
Platelets: 224 K/uL (ref 150–400)
RBC: 2.85 MIL/uL — ABNORMAL LOW (ref 4.22–5.81)
RDW: 18.3 % — ABNORMAL HIGH (ref 11.5–15.5)
WBC: 6.5 K/uL (ref 4.0–10.5)
nRBC: 2.5 % — ABNORMAL HIGH (ref 0.0–0.2)

## 2018-04-05 LAB — GLUCOSE, CAPILLARY
Glucose-Capillary: 140 mg/dL — ABNORMAL HIGH (ref 70–99)
Glucose-Capillary: 132 mg/dL — ABNORMAL HIGH (ref 70–99)
Glucose-Capillary: 139 mg/dL — ABNORMAL HIGH (ref 70–99)
Glucose-Capillary: 137 mg/dL — ABNORMAL HIGH (ref 70–99)
Glucose-Capillary: 95 mg/dL (ref 70–99)
Glucose-Capillary: 134 mg/dL — ABNORMAL HIGH (ref 70–99)

## 2018-04-05 LAB — COOXEMETRY PANEL
Carboxyhemoglobin: 1.4 % (ref 0.5–1.5)
Methemoglobin: 1.9 % — ABNORMAL HIGH (ref 0.0–1.5)
O2 Saturation: 65.4 %
Total hemoglobin: 7.8 g/dL — ABNORMAL LOW (ref 12.0–16.0)

## 2018-04-05 MED ORDER — FUROSEMIDE 10 MG/ML IJ SOLN
40.0000 mg | Freq: Once | INTRAMUSCULAR | Status: AC
Start: 2018-04-05 — End: 2018-04-05
  Administered 2018-04-05: 40 mg via INTRAVENOUS
  Filled 2018-04-05: qty 4

## 2018-04-05 MED ORDER — INSULIN ASPART 100 UNIT/ML ~~LOC~~ SOLN
0.0000 [IU] | Freq: Three times a day (TID) | SUBCUTANEOUS | Status: DC
Start: 2018-04-05 — End: 2018-04-15
  Administered 2018-04-05: 2 [IU] via SUBCUTANEOUS
  Administered 2018-04-06: 3 [IU] via SUBCUTANEOUS
  Administered 2018-04-06 – 2018-04-11 (×5): 2 [IU] via SUBCUTANEOUS
  Administered 2018-04-12: 3 [IU] via SUBCUTANEOUS
  Administered 2018-04-13 – 2018-04-15 (×4): 2 [IU] via SUBCUTANEOUS

## 2018-04-05 MED ORDER — INSULIN ASPART 100 UNIT/ML ~~LOC~~ SOLN
0.0000 [IU] | Freq: Every day | SUBCUTANEOUS | Status: DC
Start: 2018-04-05 — End: 2018-04-15

## 2018-04-05 MED ORDER — POTASSIUM CHLORIDE 10 MEQ/100ML IV SOLN
10.0000 meq | INTRAVENOUS | Status: AC
Start: 2018-04-05 — End: 2018-04-05
  Administered 2018-04-05 (×3): 10 meq via INTRAVENOUS
  Filled 2018-04-05 (×2): qty 100

## 2018-04-05 NOTE — Progress Notes (Signed)
5 Days Post-Op Procedure(s) (LRB): CORONARY ARTERY BYPASS GRAFTING (CABG) TIMES FOUR USING LEFT INTERNAL MAMMARY ARTERY AND RIGHT GREATER SAPHENOUS VEIN HARVESTED ENDOSCOPICALLY. (N/A) TRANSESOPHAGEAL ECHOCARDIOGRAM (TEE) (N/A) Subjective: C/o being uncomfortable  Objective: Vital signs in last 24 hours: Temp:  [97.5 F (36.4 C)-98.6 F (37 C)] 98 F (36.7 C) (12/07 0809) Pulse Rate:  [90-102] 97 (12/07 0700) Cardiac Rhythm: Atrial fibrillation (12/07 0745) Resp:  [7-23] 9 (12/07 0700) BP: (124-158)/(60-113) 144/67 (12/07 0700) SpO2:  [97 %-100 %] 98 % (12/07 0753) Weight:  [134.2 kg] 134.2 kg (12/07 0500)  Hemodynamic parameters for last 24 hours:    Intake/Output from previous day: 12/06 0701 - 12/07 0700 In: 1437.2 [P.O.:520; I.V.:817.2; IV Piggyback:100] Out: 1370 [Urine:1370] Intake/Output this shift: Total I/O In: -  Out: 45 [Urine:45]  General appearance: alert and mild distress Neurologic: intact Heart: regular rate and rhythm Lungs: diminished breath sounds bibasilar and wheezes bilaterally Abdomen: normal findings: soft, non-tender  Lab Results: Recent Labs    04/04/18 0409 04/05/18 0430  WBC 9.4 6.5  HGB 7.6* 7.5*  HCT 23.6* 24.3*  PLT 165 224   BMET:  Recent Labs    04/04/18 0409 04/05/18 0430  NA 131* 131*  K 3.5 3.2*  CL 98 96*  CO2 21* 25  GLUCOSE 168* 132*  BUN 43* 43*  CREATININE 2.91* 2.37*  CALCIUM 8.7* 8.9    PT/INR: No results for input(s): LABPROT, INR in the last 72 hours. ABG    Component Value Date/Time   PHART 7.316 (L) 04/02/2018 0649   HCO3 21.5 04/02/2018 0649   TCO2 23 04/02/2018 0649   ACIDBASEDEF 4.0 (H) 04/02/2018 0649   O2SAT 65.4 04/05/2018 0430   CBG (last 3)  Recent Labs    04/05/18 0015 04/05/18 0415 04/05/18 0806  GLUCAP 140* 132* 139*    Assessment/Plan: S/P Procedure(s) (LRB): CORONARY ARTERY BYPASS GRAFTING (CABG) TIMES FOUR USING LEFT INTERNAL MAMMARY ARTERY AND RIGHT GREATER SAPHENOUS VEIN  HARVESTED ENDOSCOPICALLY. (N/A) TRANSESOPHAGEAL ECHOCARDIOGRAM (TEE) (N/A) -CV- co-ox 65 this AM, will decrease milrinone to 0.125   In SR on amiodarone RESP- Continue IS, levalbuterol RENAL- creatinine down from 2.9 to 2.37  I/o even, still volume overloaded- will give IV lasix this AM ENDO- CBG mildly elevated- change CBG to AC/HS Anemia secondary to ABL- stable, follow Mobilize as tolerated  LOS: 5 days    Loreli SlotSteven C  04/05/2018

## 2018-04-05 NOTE — Progress Notes (Signed)
      [address]       [place] [id number]             [phone]      Up in chair, feels a little bit better BP 113/72   Pulse 96   Temp 98.1 F (36.7 C) (Oral)   Resp 10   Ht 5\' 11"  (1.803 m)   Wt 134.2 kg   SpO2 100%   BMI 41.27 kg/m   Intake/Output Summary (Last 24 hours) at 04/05/2018 1717 Last data filed at 04/05/2018 1600 Gross per 24 hour  Intake 1759.82 ml  Output 1615 ml  Net 144.82 ml   Will give another 40 of lasix this evening  Viviann SpareSteven C. Dorris FetchHendrickson, MD [facility] [phone]

## 2018-04-05 NOTE — Progress Notes (Signed)
Pt continues to refuse CPAP QHS. CPAP removed from pt's room at this time. Advised pt if he decides to try CPAP, to notify for RT.

## 2018-04-06 ENCOUNTER — Inpatient Hospital Stay (HOSPITAL_COMMUNITY): Payer: Medicare HMO

## 2018-04-06 ENCOUNTER — Encounter

## 2018-04-06 LAB — CBC
HCT: 23.7 % — ABNORMAL LOW (ref 39.0–52.0)
Hemoglobin: 7.4 g/dL — ABNORMAL LOW (ref 13.0–17.0)
MCH: 26.6 pg (ref 26.0–34.0)
MCHC: 31.2 g/dL (ref 30.0–36.0)
MCV: 85.3 fL (ref 80.0–100.0)
Platelets: 207 K/uL (ref 150–400)
RBC: 2.78 MIL/uL — ABNORMAL LOW (ref 4.22–5.81)
RDW: 18.2 % — ABNORMAL HIGH (ref 11.5–15.5)
WBC: 5.6 K/uL (ref 4.0–10.5)
nRBC: 2.7 % — ABNORMAL HIGH (ref 0.0–0.2)

## 2018-04-06 LAB — GLUCOSE, CAPILLARY
Glucose-Capillary: 100 mg/dL — ABNORMAL HIGH (ref 70–99)
Glucose-Capillary: 101 mg/dL — ABNORMAL HIGH (ref 70–99)
Glucose-Capillary: 110 mg/dL — ABNORMAL HIGH (ref 70–99)
Glucose-Capillary: 116 mg/dL — ABNORMAL HIGH (ref 70–99)
Glucose-Capillary: 129 mg/dL — ABNORMAL HIGH (ref 70–99)
Glucose-Capillary: 158 mg/dL — ABNORMAL HIGH (ref 70–99)
Glucose-Capillary: 97 mg/dL (ref 70–99)

## 2018-04-06 LAB — COMPREHENSIVE METABOLIC PANEL
ALT: 24 U/L (ref 0–44)
AST: 21 U/L (ref 15–41)
Albumin: 2.5 g/dL — ABNORMAL LOW (ref 3.5–5.0)
Alkaline Phosphatase: 38 U/L (ref 38–126)
Anion gap: 11 (ref 5–15)
BUN: 40 mg/dL — ABNORMAL HIGH (ref 8–23)
CO2: 24 mmol/L (ref 22–32)
Calcium: 8.5 mg/dL — ABNORMAL LOW (ref 8.9–10.3)
Chloride: 93 mmol/L — ABNORMAL LOW (ref 98–111)
Creatinine, Ser: 2.05 mg/dL — ABNORMAL HIGH (ref 0.61–1.24)
GFR calc Af Amer: 35 mL/min — ABNORMAL LOW (ref >60)
GFR calc non Af Amer: 31 mL/min — ABNORMAL LOW (ref >60)
Glucose, Bld: 99 mg/dL (ref 70–99)
Potassium: 3.1 mmol/L — ABNORMAL LOW (ref 3.5–5.1)
Sodium: 128 mmol/L — ABNORMAL LOW (ref 135–145)
Total Bilirubin: 0.7 mg/dL (ref 0.3–1.2)
Total Protein: 5.2 g/dL — ABNORMAL LOW (ref 6.5–8.1)

## 2018-04-06 LAB — COOXEMETRY PANEL
Carboxyhemoglobin: 1.5 % (ref 0.5–1.5)
Methemoglobin: 1.2 % (ref 0.0–1.5)
O2 SAT: 52.4 %
Total hemoglobin: 12.4 g/dL (ref 12.0–16.0)

## 2018-04-06 MED ORDER — FUROSEMIDE 10 MG/ML IJ SOLN
40.0000 mg | Freq: Once | INTRAMUSCULAR | Status: AC
Start: 2018-04-06 — End: 2018-04-06
  Administered 2018-04-06: 40 mg via INTRAVENOUS
  Filled 2018-04-06: qty 4

## 2018-04-06 MED ORDER — POTASSIUM CHLORIDE 10 MEQ/50ML IV SOLN
10.0000 meq | INTRAVENOUS | Status: AC
Start: 2018-04-06 — End: 2018-04-06
  Administered 2018-04-06 (×3): 10 meq via INTRAVENOUS
  Filled 2018-04-06 (×3): qty 50

## 2018-04-06 MED ORDER — AMIODARONE HCL 200 MG PO TABS
400.0000 mg | Freq: Two times a day (BID) | ORAL | Status: DC
Start: 2018-04-06 — End: 2018-04-10
  Administered 2018-04-06 – 2018-04-09 (×8): 400 mg via ORAL
  Filled 2018-04-06 (×8): qty 2

## 2018-04-06 MED ORDER — ACETAMINOPHEN 500 MG PO TABS
1000.0000 mg | Freq: Four times a day (QID) | ORAL | Status: DC
Start: 2018-04-06 — End: 2018-04-10
  Administered 2018-04-06 – 2018-04-10 (×15): 1000 mg via ORAL
  Filled 2018-04-06 (×16): qty 2

## 2018-04-06 MED ORDER — POTASSIUM CHLORIDE 10 MEQ/50ML IV SOLN
10.0000 meq | INTRAVENOUS | Status: AC
Start: 2018-04-06 — End: 2018-04-06
  Administered 2018-04-06 (×2): 10 meq via INTRAVENOUS
  Filled 2018-04-06 (×2): qty 50

## 2018-04-06 MED ORDER — LEVALBUTEROL HCL 0.63 MG/3ML IN NEBU
0.6300 mg | Freq: Four times a day (QID) | RESPIRATORY_TRACT | Status: DC | PRN
Start: 2018-04-06 — End: 2018-04-07

## 2018-04-06 NOTE — Progress Notes (Signed)
      [address]       [place] [id number]             [phone]      Ambulating BP 137/61   Pulse 84   Temp 98.3 F (36.8 C) (Oral)   Resp 14   Ht 5\' 11"  (1.803 m)   Wt (!) 138.3 kg   SpO2 100%   BMI 42.51 kg/m   Intake/Output Summary (Last 24 hours) at 04/06/2018 1823 Last data filed at 04/06/2018 1800 Gross per 24 hour  Intake 1754.39 ml  Output 3750 ml  Net -1995.61 ml   Stable day. Slow but steady progress  Viviann SpareSteven C. Dorris FetchHendrickson, MD [facility] [phone]

## 2018-04-06 NOTE — Progress Notes (Signed)
6 Days Post-Op Procedure(s) (LRB): CORONARY ARTERY BYPASS GRAFTING (CABG) TIMES FOUR USING LEFT INTERNAL MAMMARY ARTERY AND RIGHT GREATER SAPHENOUS VEIN HARVESTED ENDOSCOPICALLY. (N/A) TRANSESOPHAGEAL ECHOCARDIOGRAM (TEE) (N/A) Subjective: Multiple complaints- tired,sore  Objective: Vital signs in last 24 hours: Temp:  [97.8 F (36.6 C)-99.9 F (37.7 C)] 98.1 F (36.7 C) (12/08 0800) Pulse Rate:  [87-96] 91 (12/08 0800) Cardiac Rhythm: Atrial fibrillation (12/08 0800) Resp:  [9-20] 14 (12/08 0800) BP: (111-154)/(55-78) 129/56 (12/08 0800) SpO2:  [96 %-100 %] 98 % (12/08 0800) Weight:  [138.3 kg] 138.3 kg (12/08 0354)  Hemodynamic parameters for last 24 hours:    Intake/Output from previous day: 12/07 0701 - 12/08 0700 In: 1803.8 [P.O.:680; I.V.:823.8; IV Piggyback:300] Out: 3345 [Urine:3345] Intake/Output this shift: Total I/O In: 196.8 [P.O.:120; I.V.:26.8; IV Piggyback:50] Out: 125 [Urine:125]  General appearance: alert, cooperative and no distress Neurologic: intact Heart: regular rate and rhythm Lungs: diminished breath sounds bibasilar Abdomen: normal findings: soft, non-tender Wound: clean and dry  Lab Results: Recent Labs    04/05/18 0430 04/06/18 0322  WBC 6.5 5.6  HGB 7.5* 7.4*  HCT 24.3* 23.7*  PLT 224 207   BMET:  Recent Labs    04/05/18 0430 04/06/18 0322  NA 131* 128*  K 3.2* 3.1*  CL 96* 93*  CO2 25 24  GLUCOSE 132* 99  BUN 43* 40*  CREATININE 2.37* 2.05*  CALCIUM 8.9 8.5*    PT/INR: No results for input(s): LABPROT, INR in the last 72 hours. ABG    Component Value Date/Time   PHART 7.316 (L) 04/02/2018 0649   HCO3 21.5 04/02/2018 0649   TCO2 23 04/02/2018 0649   ACIDBASEDEF 4.0 (H) 04/02/2018 0649   O2SAT 52.4 04/06/2018 0346   CBG (last 3)  Recent Labs    04/05/18 2358 04/06/18 0430 04/06/18 0806  GLUCAP 110* 100* 97    Assessment/Plan: S/P Procedure(s) (LRB): CORONARY ARTERY BYPASS GRAFTING (CABG) TIMES FOUR USING  LEFT INTERNAL MAMMARY ARTERY AND RIGHT GREATER SAPHENOUS VEIN HARVESTED ENDOSCOPICALLY. (N/A) TRANSESOPHAGEAL ECHOCARDIOGRAM (TEE) (N/A) -CV- co-ox 52 this morning on 0.125 mcg/kg/min of milrinone  Continue milrinone  Rate controlled atrial fib- Change amiodarone to PO  RESP- continue IS and flutter valve  RENAL- creatinine better at 2.05 and 1.5 L negative over past 24 hours  Continue diuresis  Hypokalemia- supplement K  ENDO- CBG well controlled  Will resume baseline acetaminophen in addition to PRN oxycodone  Mobilize as tolerated   LOS: 6 days    [name] 04/06/2018

## 2018-04-07 ENCOUNTER — Encounter

## 2018-04-07 ENCOUNTER — Inpatient Hospital Stay (HOSPITAL_COMMUNITY): Payer: Medicare HMO

## 2018-04-07 LAB — COMPREHENSIVE METABOLIC PANEL
ALT: 30 U/L (ref 0–44)
AST: 30 U/L (ref 15–41)
Albumin: 2.5 g/dL — ABNORMAL LOW (ref 3.5–5.0)
Alkaline Phosphatase: 37 U/L — ABNORMAL LOW (ref 38–126)
Anion gap: 10 (ref 5–15)
BUN: 33 mg/dL — ABNORMAL HIGH (ref 8–23)
CO2: 26 mmol/L (ref 22–32)
Calcium: 8.6 mg/dL — ABNORMAL LOW (ref 8.9–10.3)
Chloride: 94 mmol/L — ABNORMAL LOW (ref 98–111)
Creatinine, Ser: 1.72 mg/dL — ABNORMAL HIGH (ref 0.61–1.24)
GFR calc Af Amer: 44 mL/min — ABNORMAL LOW (ref >60)
GFR calc non Af Amer: 38 mL/min — ABNORMAL LOW (ref >60)
Glucose, Bld: 81 mg/dL (ref 70–99)
Potassium: 3.3 mmol/L — ABNORMAL LOW (ref 3.5–5.1)
Sodium: 130 mmol/L — ABNORMAL LOW (ref 135–145)
Total Bilirubin: 1.3 mg/dL — ABNORMAL HIGH (ref 0.3–1.2)
Total Protein: 5.1 g/dL — ABNORMAL LOW (ref 6.5–8.1)

## 2018-04-07 LAB — GLUCOSE, CAPILLARY
Glucose-Capillary: 74 mg/dL (ref 70–99)
Glucose-Capillary: 90 mg/dL (ref 70–99)
Glucose-Capillary: 95 mg/dL (ref 70–99)
Glucose-Capillary: 118 mg/dL — ABNORMAL HIGH (ref 70–99)
Glucose-Capillary: 134 mg/dL — ABNORMAL HIGH (ref 70–99)

## 2018-04-07 LAB — COOXEMETRY PANEL
Carboxyhemoglobin: 1.9 % — ABNORMAL HIGH (ref 0.5–1.5)
Methemoglobin: 1.2 % (ref 0.0–1.5)
O2 Saturation: 56.5 %
Total hemoglobin: 8.1 g/dL — ABNORMAL LOW (ref 12.0–16.0)

## 2018-04-07 LAB — CBC
HCT: 25.6 % — ABNORMAL LOW (ref 39.0–52.0)
Hemoglobin: 7.8 g/dL — ABNORMAL LOW (ref 13.0–17.0)
MCH: 26.4 pg (ref 26.0–34.0)
MCHC: 30.5 g/dL (ref 30.0–36.0)
MCV: 86.5 fL (ref 80.0–100.0)
Platelets: 266 K/uL (ref 150–400)
RBC: 2.96 MIL/uL — ABNORMAL LOW (ref 4.22–5.81)
RDW: 18.6 % — ABNORMAL HIGH (ref 11.5–15.5)
WBC: 5.3 K/uL (ref 4.0–10.5)
nRBC: 2.7 % — ABNORMAL HIGH (ref 0.0–0.2)

## 2018-04-07 MED ORDER — FUROSEMIDE 10 MG/ML IJ SOLN
40.0000 mg | Freq: Every day | INTRAMUSCULAR | Status: DC
Start: 2018-04-07 — End: 2018-04-09
  Administered 2018-04-07 – 2018-04-08 (×2): 40 mg via INTRAVENOUS
  Filled 2018-04-07 (×2): qty 4

## 2018-04-07 MED ORDER — SORBITOL 70 % PO SOLN
30.0000 mL | Freq: Once | ORAL | Status: DC
Start: 2018-04-07 — End: 2018-04-07

## 2018-04-07 MED ORDER — METOPROLOL TARTRATE 12.5 MG HALF TABLET
12.5000 mg | Freq: Two times a day (BID) | ORAL | Status: DC
Start: 2018-04-07 — End: 2018-04-10
  Administered 2018-04-07 – 2018-04-09 (×6): 12.5 mg via ORAL
  Filled 2018-04-07 (×6): qty 1

## 2018-04-07 MED ORDER — SORBITOL 70 % SOLN
30.0000 mL | Freq: Once | Status: AC
Start: 2018-04-07 — End: 2018-04-07
  Administered 2018-04-07: 30 mL via ORAL
  Filled 2018-04-07: qty 30

## 2018-04-07 MED ORDER — INSULIN DETEMIR 100 UNIT/ML ~~LOC~~ SOLN
15.0000 [IU] | Freq: Two times a day (BID) | SUBCUTANEOUS | Status: DC
Start: 2018-04-07 — End: 2018-04-08
  Administered 2018-04-07: 15 [IU] via SUBCUTANEOUS
  Filled 2018-04-07 (×3): qty 0.15

## 2018-04-07 MED ORDER — POTASSIUM CHLORIDE 10 MEQ/50ML IV SOLN
10.0000 meq | INTRAVENOUS | Status: AC
Start: 2018-04-07 — End: 2018-04-07
  Administered 2018-04-07 (×3): 10 meq via INTRAVENOUS
  Filled 2018-04-07 (×3): qty 50

## 2018-04-07 MED ORDER — LACTULOSE 10 GM/15ML PO SOLN
30.0000 g | Freq: Every day | ORAL | Status: DC
Start: 2018-04-07 — End: 2018-04-08
  Administered 2018-04-07: 30 g via ORAL
  Filled 2018-04-07: qty 45

## 2018-04-07 MED ORDER — INSULIN DETEMIR 100 UNIT/ML ~~LOC~~ SOLN
20.0000 [IU] | Freq: Two times a day (BID) | SUBCUTANEOUS | Status: DC
Start: 2018-04-07 — End: 2018-04-07
  Filled 2018-04-07: qty 0.2

## 2018-04-07 MED ORDER — BISACODYL 10 MG RE SUPP
10.0000 mg | Freq: Once | RECTAL | Status: AC
Start: 2018-04-07 — End: 2018-04-07
  Administered 2018-04-07: 10 mg via RECTAL
  Filled 2018-04-07: qty 1

## 2018-04-07 MED ORDER — POTASSIUM CHLORIDE CRYS ER 20 MEQ PO TBCR
20.0000 meq | Freq: Once | ORAL | Status: DC
Start: 2018-04-07 — End: 2018-04-07

## 2018-04-07 MED ORDER — POTASSIUM CHLORIDE CRYS ER 20 MEQ PO TBCR
40.0000 meq | Freq: Every day | ORAL | Status: DC
Start: 2018-04-07 — End: 2018-04-09
  Administered 2018-04-07 – 2018-04-08 (×2): 40 meq via ORAL
  Filled 2018-04-07 (×2): qty 2

## 2018-04-07 NOTE — Progress Notes (Signed)
RT Note:  Patient refused CPAP. 

## 2018-04-07 NOTE — Progress Notes (Addendum)
TCTS DAILY ICU PROGRESS NOTE                   [address]            [place] [id number]          [phone]   7 Days Post-Op Procedure(s) (LRB): CORONARY ARTERY BYPASS GRAFTING (CABG) TIMES FOUR USING LEFT INTERNAL MAMMARY ARTERY AND RIGHT GREATER SAPHENOUS VEIN HARVESTED ENDOSCOPICALLY. (N/A) TRANSESOPHAGEAL ECHOCARDIOGRAM (TEE) (N/A)  Total Length of Stay:  LOS: 7 days   Subjective: Patient already walked this am. He is cold and states his sugar was really low earlier this am.  Objective: Vital signs in last 24 hours: Temp:  [97.8 F (36.6 C)-98.4 F (36.9 C)] 98 F (36.7 C) (12/09 0400) Pulse Rate:  [77-92] 77 (12/09 0700) Cardiac Rhythm: Atrial fibrillation (12/09 0400) Resp:  [11-20] 13 (12/09 0700) BP: (106-147)/(42-108) 147/73 (12/09 0600) SpO2:  [93 %-100 %] 100 % (12/09 0700) Weight:  [136.5 kg] 136.5 kg (12/09 0500)  Filed Weights   04/05/18 0500 04/06/18 0354 04/07/18 0500  Weight: 134.2 kg (!) 138.3 kg (!) 136.5 kg    Weight change: -1.756 kg      Intake/Output from previous day: 12/08 0701 - 12/09 0700 In: 1531.4 [P.O.:1040; I.V.:241.4; IV Piggyback:250] Out: 2715 [Urine:2715]  Intake/Output this shift: No intake/output data recorded.  Current Meds: Scheduled Meds: . acetaminophen  1,000 mg Oral Q6H  . amiodarone  400 mg Oral BID  . aspirin EC  325 mg Oral Daily   Or  . aspirin  324 mg Per Tube Daily  . bisacodyl  10 mg Oral Daily  . Chlorhexidine Gluconate Cloth  6 each Topical Daily  . docusate sodium  200 mg Oral Daily  . enoxaparin (LOVENOX) injection  40 mg Subcutaneous Q24H  . ferrous fumarate-b12-vitamic C-folic acid  1 capsule Oral Q breakfast  . insulin aspart  0-15 Units Subcutaneous TID WC  . insulin aspart  0-5 Units Subcutaneous QHS  . insulin aspart  6 Units Subcutaneous TID WC  . insulin detemir  30 Units Subcutaneous BID  . loratadine  10 mg Oral Daily  . mouth rinse  15 mL Mouth Rinse BID  .  pantoprazole  40 mg Oral Daily  . pravastatin  40 mg Oral QHS  . sodium chloride flush  10-40 mL Intracatheter Q12H  . sodium chloride flush  3 mL Intravenous Q12H   Continuous Infusions: . sodium chloride    . sodium chloride Stopped (04/03/18 0631)  . milrinone 0.125 mcg/kg/min (04/06/18 1600)  . potassium chloride 10 mEq (04/07/18 0604)   PRN Meds:.sodium chloride, levalbuterol, metoprolol tartrate, ondansetron (ZOFRAN) IV, oxyCODONE, sodium chloride flush, sodium chloride flush  General appearance: cooperative and no distress Neurologic: intact Heart: IRRR IRRR Lungs: Slightly diminished at bases Abdomen: Soft, non tender, obese, occaiosnal bowel sounds present Extremities: ++ bilateral LE edmea and ecchymosis right thigh Wounds: Clean and dry  Lab Results: CBC: Recent Labs    04/06/18 0322 04/07/18 0332  WBC 5.6 5.3  HGB 7.4* 7.8*  HCT 23.7* 25.6*  PLT 207 266   BMET:  Recent Labs    04/06/18 0322 04/07/18 0332  NA 128* 130*  K 3.1* 3.3*  CL 93* 94*  CO2 24 26  GLUCOSE 99 81  BUN 40* 33*  CREATININE 2.05* 1.72*  CALCIUM 8.5* 8.6*    CMET: Lab Results  Component Value Date   WBC 5.3 04/07/2018   HGB 7.8 (L)  04/07/2018   HCT 25.6 (L) 04/07/2018   PLT 266 04/07/2018   GLUCOSE 81 04/07/2018   ALT 30 04/07/2018   AST 30 04/07/2018   NA 130 (L) 04/07/2018   K 3.3 (L) 04/07/2018   CL 94 (L) 04/07/2018   CREATININE 1.72 (H) 04/07/2018   BUN 33 (H) 04/07/2018   CO2 26 04/07/2018   TSH 3.640 02/25/2018   INR 1.37 03/31/2018   HGBA1C 7.1 (H) 03/14/2018      PT/INR:  No results for input(s): LABPROT, INR in the last 72 hours. Radiology: No results found.  Assessment/Plan: S/P Procedure(s) (LRB): CORONARY ARTERY BYPASS GRAFTING (CABG) TIMES FOUR USING LEFT INTERNAL MAMMARY ARTERY AND RIGHT GREATER SAPHENOUS VEIN HARVESTED ENDOSCOPICALLY. (N/A) TRANSESOPHAGEAL ECHOCARDIOGRAM (TEE) (N/A)  1. CV-A fib with CVR this am.  On Amiodarone 400 mg bid,  Lopressor 12.5 mg bid, and Milrinone drip. Co ox this am slightly increased to 56.5. Likely stop Milrinone drip 2. Pulmonary- CXR this am appears similar to yesterday's  (low lung volumes, cardiomegaly, bibasilar atelectasis and small pleural effusions.)  Down to 2 liters of oxygen via [place] 3. Volume overload- 4. ABL anemia-H and H this am decreased to 7.8 and 25.6. Has had previous transfusion. Continue Trinsicon and give one dose of Ferric gluconate.  5. DM-CBGs 129/101/74. On Insulin but will decrease for to avoid further hypoglycemia. Was on Metformin prior to admission and will restart once tolerating oral and creatinine improved. Pre op HGA1C 7.1 6. AKI-Creatinine decreased to 1.72 this am 7. Hyponatremia-sodium 130. On fluid restriction 8. Gently supplement potassium 9. LOC constipation 10. Hope to transfer to 4E soon  Ardelle Balls PA-C 04/07/2018 7:20 AM   Paced from a-flutter to nsr Needs BM Renal fx better   Dc central line, foley to reduce infection risk patient examined and medical record reviewed,agree with above note. Kathlee Nations Trigt III 04/07/2018

## 2018-04-07 NOTE — Progress Notes (Signed)
At approximately 0630, pt was woken up to ambulate for morning walk. Pt was diaphoretic and complained of feeling weak. Pt was weighed and walked to the door and sat in the chair. Blood sugar checked: 74. Pt said this was very low for him, and that he's never been that low before. x1 apple juice was given w/ graham crackers and peanut butter. Pt resting comfortably in chair. VSS. Will continue to monitor.

## 2018-04-07 NOTE — Progress Notes (Signed)
Pt vomited at this time x3, Zofran given. RN will continue to monitor.

## 2018-04-07 NOTE — Progress Notes (Signed)
CT surgery p.m. Rounds  Patient maintaining sinus rhythm Ambulate in hall Blood sugars better controlled Still no bowel movement after sorbitol and lactulose-suppository ordered

## 2018-04-07 NOTE — Plan of Care (Signed)
  Problem: Clinical Measurements: Goal: Ability to maintain clinical measurements within normal limits will improve Outcome: Progressing Goal: Will remain free from infection Outcome: Progressing Goal: Respiratory complications will improve Outcome: Progressing Goal: Cardiovascular complication will be avoided Outcome: Progressing   Problem: Activity: Goal: Risk for activity intolerance will decrease Outcome: Progressing   Problem: Coping: Goal: Level of anxiety will decrease Outcome: Progressing   Problem: Education: Goal: Knowledge of disease or condition will improve Outcome: Progressing   Problem: Cardiac: Goal: Will achieve and/or maintain hemodynamic stability Outcome: Progressing   Problem: Nutrition: Goal: Adequate nutrition will be maintained Outcome: Not Progressing   Problem: Elimination: Goal: Will not experience complications related to bowel motility Outcome: Not Progressing Goal: Will not experience complications related to urinary retention Outcome: Not Progressing  Patient had foley catheter removed at 1230 hours. Patient has not voided. Nurse will bladder scan and intervene as needed. Patient has not had a bowel movement. Patient does have bowel sounds and is having flatus. Dulcolax rectal suppository given to patient. See MAR.

## 2018-04-08 ENCOUNTER — Encounter

## 2018-04-08 ENCOUNTER — Inpatient Hospital Stay (HOSPITAL_COMMUNITY): Payer: Medicare HMO

## 2018-04-08 LAB — BASIC METABOLIC PANEL
Anion gap: 13 (ref 5–15)
BUN: 29 mg/dL — ABNORMAL HIGH (ref 8–23)
CO2: 23 mmol/L (ref 22–32)
Calcium: 8.7 mg/dL — ABNORMAL LOW (ref 8.9–10.3)
Chloride: 96 mmol/L — ABNORMAL LOW (ref 98–111)
Creatinine, Ser: 1.53 mg/dL — ABNORMAL HIGH (ref 0.61–1.24)
GFR calc Af Amer: 50 mL/min — ABNORMAL LOW (ref >60)
GFR calc non Af Amer: 44 mL/min — ABNORMAL LOW (ref >60)
Glucose, Bld: 116 mg/dL — ABNORMAL HIGH (ref 70–99)
Potassium: 3.8 mmol/L (ref 3.5–5.1)
Sodium: 132 mmol/L — ABNORMAL LOW (ref 135–145)

## 2018-04-08 LAB — CBC
HCT: 26.3 % — ABNORMAL LOW (ref 39.0–52.0)
Hemoglobin: 8.4 g/dL — ABNORMAL LOW (ref 13.0–17.0)
MCH: 27 pg (ref 26.0–34.0)
MCHC: 31.9 g/dL (ref 30.0–36.0)
MCV: 84.6 fL (ref 80.0–100.0)
Platelets: 282 K/uL (ref 150–400)
RBC: 3.11 MIL/uL — ABNORMAL LOW (ref 4.22–5.81)
RDW: 18.6 % — ABNORMAL HIGH (ref 11.5–15.5)
WBC: 6.2 K/uL (ref 4.0–10.5)
nRBC: 1.1 % — ABNORMAL HIGH (ref 0.0–0.2)

## 2018-04-08 LAB — PROTIME-INR
INR: 1.06
Prothrombin Time: 13.7 seconds (ref 11.4–15.2)

## 2018-04-08 LAB — GLUCOSE, CAPILLARY
Glucose-Capillary: 113 mg/dL — ABNORMAL HIGH (ref 70–99)
Glucose-Capillary: 131 mg/dL — ABNORMAL HIGH (ref 70–99)
Glucose-Capillary: 128 mg/dL — ABNORMAL HIGH (ref 70–99)
Glucose-Capillary: 146 mg/dL — ABNORMAL HIGH (ref 70–99)

## 2018-04-08 MED ORDER — INSULIN DETEMIR 100 UNIT/ML ~~LOC~~ SOLN
12.0000 [IU] | Freq: Two times a day (BID) | SUBCUTANEOUS | Status: DC
Start: 2018-04-08 — End: 2018-04-10
  Administered 2018-04-08 – 2018-04-09 (×3): 12 [IU] via SUBCUTANEOUS
  Filled 2018-04-08 (×6): qty 0.12

## 2018-04-08 MED ORDER — TAMSULOSIN HCL 0.4 MG PO CAPS
0.4000 mg | Freq: Every day | ORAL | Status: DC
Start: 2018-04-08 — End: 2018-04-15
  Administered 2018-04-08 – 2018-04-15 (×8): 0.4 mg via ORAL
  Filled 2018-04-08 (×8): qty 1

## 2018-04-08 MED ORDER — SORBITOL 70 % SOLN
30.0000 mL | Freq: Once | Status: AC
Start: 2018-04-08 — End: 2018-04-08
  Administered 2018-04-08: 30 mL via ORAL
  Filled 2018-04-08: qty 30

## 2018-04-08 MED ORDER — METOCLOPRAMIDE HCL 5 MG/ML IJ SOLN
10.0000 mg | Freq: Four times a day (QID) | INTRAMUSCULAR | Status: DC
Start: 2018-04-08 — End: 2018-04-12
  Administered 2018-04-08 – 2018-04-12 (×15): 10 mg via INTRAVENOUS
  Filled 2018-04-08 (×15): qty 2

## 2018-04-08 NOTE — Progress Notes (Signed)
RT Note:  Patient started desating into 3460s.  Went into room and talked with patient.  Got patient to try CPAP.  Patient unaware of settings, admitted to not using CPAP at home.  Stated he does not like mask at home.  Since unaware of patient settings, started patient on max of 16 and minimum of 5 on CPAP.  Patient currently using a large mask.

## 2018-04-08 NOTE — Progress Notes (Signed)
TCTS DAILY ICU PROGRESS NOTE                   [address]            [place] [address]          [phone]   8 Days Post-Op Procedure(s) (LRB): CORONARY ARTERY BYPASS GRAFTING (CABG) TIMES FOUR USING LEFT INTERNAL MAMMARY ARTERY AND RIGHT GREATER SAPHENOUS VEIN HARVESTED ENDOSCOPICALLY. (N/A) TRANSESOPHAGEAL ECHOCARDIOGRAM (TEE) (N/A)  Total Length of Stay:  LOS: 8 days   Subjective: Patient vomited twice yesterday. He had a bowel movement. He denies nausea this am. Overall, he is feeling better.  Objective: Vital signs in last 24 hours: Temp:  [97.7 F (36.5 C)-98.2 F (36.8 C)] 98.2 F (36.8 C) (12/10 0325) Pulse Rate:  [83-95] 89 (12/10 0600) Cardiac Rhythm: Normal sinus rhythm (12/10 0400) Resp:  [12-21] 20 (12/10 0600) BP: (98-157)/(43-117) 157/87 (12/10 0600) SpO2:  [95 %-100 %] 97 % (12/10 0600) Weight:  [134.1 kg] 134.1 kg (12/10 0700)  Filed Weights   04/06/18 0354 04/07/18 0500 04/08/18 0700  Weight: (!) 138.3 kg (!) 136.5 kg 134.1 kg    Weight change: -2.4 kg      Intake/Output from previous day: 12/09 0701 - 12/10 0700 In: 470 [P.O.:410; I.V.:10; IV Piggyback:50] Out: 2160 [Urine:2160]  Intake/Output this shift: No intake/output data recorded.  Current Meds: Scheduled Meds: . acetaminophen  1,000 mg Oral Q6H  . amiodarone  400 mg Oral BID  . aspirin EC  325 mg Oral Daily   Or  . aspirin  324 mg Per Tube Daily  . bisacodyl  10 mg Oral Daily  . Chlorhexidine Gluconate Cloth  6 each Topical Daily  . docusate sodium  200 mg Oral Daily  . enoxaparin (LOVENOX) injection  40 mg Subcutaneous Q24H  . furosemide  40 mg Intravenous Daily  . insulin aspart  0-15 Units Subcutaneous TID WC  . insulin aspart  0-5 Units Subcutaneous QHS  . insulin aspart  6 Units Subcutaneous TID WC  . insulin detemir  15 Units Subcutaneous BID  . lactulose  30 g Oral Daily  . loratadine  10 mg Oral Daily  . mouth rinse  15 mL Mouth Rinse BID  .  metoprolol tartrate  12.5 mg Oral BID  . pantoprazole  40 mg Oral Daily  . potassium chloride  40 mEq Oral Daily  . pravastatin  40 mg Oral QHS  . sodium chloride flush  3 mL Intravenous Q12H   Continuous Infusions: . sodium chloride    . sodium chloride Stopped (04/03/18 0631)   PRN Meds:.sodium chloride, metoprolol tartrate, ondansetron (ZOFRAN) IV, oxyCODONE, sodium chloride flush  General appearance: cooperative and no distress Heart: RRR Lungs: Slightly diminished at bases Abdomen: Soft, non tender, obese, occaiosnal high pitched bowel sounds present Extremities: ++ bilateral LE edmea and ecchymosis right thigh Wounds: Clean and dry  Lab Results: CBC: Recent Labs    04/07/18 0332 04/08/18 0238  WBC 5.3 6.2  HGB 7.8* 8.4*  HCT 25.6* 26.3*  PLT 266 282   BMET:  Recent Labs    04/07/18 0332 04/08/18 0238  NA 130* 132*  K 3.3* 3.8  CL 94* 96*  CO2 26 23  GLUCOSE 81 116*  BUN 33* 29*  CREATININE 1.72* 1.53*  CALCIUM 8.6* 8.7*    CMET: Lab Results  Component Value Date   WBC 6.2 04/08/2018   HGB 8.4 (L) 04/08/2018   HCT 26.3 (L)  04/08/2018   PLT 282 04/08/2018   GLUCOSE 116 (H) 04/08/2018   ALT 30 04/07/2018   AST 30 04/07/2018   NA 132 (L) 04/08/2018   K 3.8 04/08/2018   CL 96 (L) 04/08/2018   CREATININE 1.53 (H) 04/08/2018   BUN 29 (H) 04/08/2018   CO2 23 04/08/2018   TSH 3.640 02/25/2018   INR 1.06 04/08/2018   HGBA1C 7.1 (H) 03/14/2018      PT/INR:  Recent Labs    04/08/18 0238  LABPROT 13.7  INR 1.06   Radiology: No results found.  Assessment/Plan: S/P Procedure(s) (LRB): CORONARY ARTERY BYPASS GRAFTING (CABG) TIMES FOUR USING LEFT INTERNAL MAMMARY ARTERY AND RIGHT GREATER SAPHENOUS VEIN HARVESTED ENDOSCOPICALLY. (N/A) TRANSESOPHAGEAL ECHOCARDIOGRAM (TEE) (N/A)  1. CV-A fib with CVR this am. Rapid A paced yesterday and maintaining sinus rhythm.  On Amiodarone 400 mg bid and Lopressor 12.5 mg bid. 2. Pulmonary- CXR this am appears  to show low lung volumes, cardiomegaly, bibasilar atelectasis (worse on the left) and small pleural effusions.  On room air. 3. Volume overload-on Lasix 40 mg IV daily with good diuresis 4. ABL anemia-H and H this am increased to 8.4 and 26.3. Has had previous transfusion. Continue Trinsicon and give one dose of Ferric gluconate.  5. DM-CBGs 95/118/134. On Insulin but will decrease for to avoid further hypoglycemia. Was on Metformin prior to admission and will restart once tolerating oral and creatinine improved. Pre op HGA1C 7.1 6. AKI-Creatinine decreased to 1.53 this am 7. Hyponatremia-sodium 130. On fluid restriction 8. Urinary retention-in and out cath'd twice yesterday. Start Flomax. Monitor need for foley to be reinserted 9. GI-vomited last evening twice. Had a bowel movement. He was on Reglan. Monitor today-if further GI symptoms, will order KUB and restart Reglan. 10. Hope to transfer to 4E soon  Ardelle BallsDonielle M Naithen Rivenburg PA-C 04/08/2018 7:50 AM

## 2018-04-08 NOTE — Progress Notes (Signed)
Pt IV would not flush or pull back any blood. IV team consulted. IV placed a PIV in the right hand. RN will continue to monitor.

## 2018-04-08 NOTE — Progress Notes (Signed)
Patient ID: [name], male   DOB: [birth year], 72 y.o.   MRN: [id number] EVENING ROUNDS NOTE :     [address]       [place] [id number]             [phone]                 8 Days Post-Op Procedure(s) (LRB): CORONARY ARTERY BYPASS GRAFTING (CABG) TIMES FOUR USING LEFT INTERNAL MAMMARY ARTERY AND RIGHT GREATER SAPHENOUS VEIN HARVESTED ENDOSCOPICALLY. (N/A) TRANSESOPHAGEAL ECHOCARDIOGRAM (TEE) (N/A)  Total Length of Stay:  LOS: 8 days  BP (!) 141/59   Pulse 88   Temp 98.2 F (36.8 C) (Oral)   Resp 19   Ht 5\' 11"  (1.803 m)   Wt 134.1 kg   SpO2 95%   BMI 41.23 kg/m   .Intake/Output      12/09 0701 - 12/10 0700 12/10 0701 - 12/11 0700   P.O. 410 120   I.V. (mL/kg) 10 (0.1)    IV Piggyback 50    Total Intake(mL/kg) 470 (3.5) 120 (0.9)   Urine (mL/kg/hr) 2160 (0.7) 200 (0.2)   Emesis/NG output 0    Stool 0 0   Total Output 2160 200   Net -1690 -80        Urine Occurrence 1 x 5 x   Stool Occurrence 2 x 4 x   Emesis Occurrence 1 x      . sodium chloride    . sodium chloride Stopped (04/03/18 0631)     Lab Results  Component Value Date   WBC 6.2 04/08/2018   HGB 8.4 (L) 04/08/2018   HCT 26.3 (L) 04/08/2018   PLT 282 04/08/2018   GLUCOSE 116 (H) 04/08/2018   ALT 30 04/07/2018   AST 30 04/07/2018   NA 132 (L) 04/08/2018   K 3.8 04/08/2018   CL 96 (L) 04/08/2018   CREATININE 1.53 (H) 04/08/2018   BUN 29 (H) 04/08/2018   CO2 23 04/08/2018   TSH 3.640 02/25/2018   INR 1.06 04/08/2018   HGBA1C 7.1 (H) 03/14/2018   AKI , cr down to 1.53 Now sinus   Delight OvensEdward B Elisheba Mcdonnell MD  Beeper [phone] Office [phone] 04/08/2018 4:41 PM

## 2018-04-08 NOTE — Plan of Care (Signed)
  Problem: Health Behavior/Discharge Planning: Goal: Ability to manage health-related needs will improve Outcome: Progressing   Problem: Clinical Measurements: Goal: Ability to maintain clinical measurements within normal limits will improve Outcome: Progressing Goal: Will remain free from infection Outcome: Progressing Goal: Diagnostic test results will improve Outcome: Progressing Goal: Respiratory complications will improve Outcome: Progressing Goal: Cardiovascular complication will be avoided Outcome: Progressing   Problem: Activity: Goal: Risk for activity intolerance will decrease Outcome: Progressing   Problem: Nutrition: Goal: Adequate nutrition will be maintained Outcome: Progressing   Problem: Coping: Goal: Level of anxiety will decrease Outcome: Progressing   Problem: Elimination: Goal: Will not experience complications related to bowel motility Outcome: Progressing  Patient has had several bowel movements throughout the day. Patient states he feels better.  Goal: Will not experience complications related to urinary retention Outcome: Progressing  Patient has been urinating without complication.   Problem: Pain Managment: Goal: General experience of comfort will improve Outcome: Progressing   Problem: Safety: Goal: Ability to remain free from injury will improve Outcome: Progressing   Problem: Skin Integrity: Goal: Risk for impaired skin integrity will decrease Outcome: Progressing   Problem: Education: Goal: Knowledge of the prescribed therapeutic regimen will improve Outcome: Progressing   Problem: Activity: Goal: Risk for activity intolerance will decrease Outcome: Progressing   Problem: Cardiac: Goal: Will achieve and/or maintain hemodynamic stability Outcome: Progressing   Problem: Clinical Measurements: Goal: Postoperative complications will be avoided or minimized Outcome: Progressing   Problem: Respiratory: Goal: Respiratory status  will improve Outcome: Progressing   Problem: Skin Integrity: Goal: Wound healing without signs and symptoms of infection Outcome: Progressing Goal: Risk for impaired skin integrity will decrease Outcome: Progressing   Problem: Urinary Elimination: Goal: Ability to achieve and maintain adequate renal perfusion and functioning will improve Outcome: Progressing

## 2018-04-08 NOTE — Progress Notes (Signed)
I received a request from the nurse to provide spiritual support for the patient. I visited the patient's room with his daughter, Corrie DandyMary, present. I provided spiritual support by sharing words of encouragement and by leading in prayer. I shared that the Chaplain is available for additional support as needed or requested.    04/08/18 1100  Clinical Encounter Type  Visited With Patient and family together  Visit Type Spiritual support  Referral From Nurse  Consult/Referral To Chaplain  Spiritual Encounters  Spiritual Needs Prayer    Chaplain Dr Melvyn NovasMichael Fordyce Lepak

## 2018-04-08 NOTE — Progress Notes (Signed)
Patient stated he had no need to void during shift. Patient did state he felt somewhat uncomfortable. Nurse performed in/out catheterization X2 during shift. 520 mL urine obtained from first catheterization. 520 mL obtained from bladder from second catheterization. Patient tolerated well.

## 2018-04-09 ENCOUNTER — Inpatient Hospital Stay (HOSPITAL_COMMUNITY): Payer: Medicare HMO

## 2018-04-09 ENCOUNTER — Encounter

## 2018-04-09 LAB — CBC
HCT: 28.7 % — ABNORMAL LOW (ref 39.0–52.0)
Hemoglobin: 8.9 g/dL — ABNORMAL LOW (ref 13.0–17.0)
MCH: 26.6 pg (ref 26.0–34.0)
MCHC: 31 g/dL (ref 30.0–36.0)
MCV: 85.7 fL (ref 80.0–100.0)
Platelets: 365 K/uL (ref 150–400)
RBC: 3.35 MIL/uL — ABNORMAL LOW (ref 4.22–5.81)
RDW: 19.2 % — ABNORMAL HIGH (ref 11.5–15.5)
WBC: 9 K/uL (ref 4.0–10.5)
nRBC: 0.4 % — ABNORMAL HIGH (ref 0.0–0.2)

## 2018-04-09 LAB — BASIC METABOLIC PANEL
Anion gap: 12 (ref 5–15)
BUN: 25 mg/dL — ABNORMAL HIGH (ref 8–23)
CO2: 22 mmol/L (ref 22–32)
Calcium: 8.3 mg/dL — ABNORMAL LOW (ref 8.9–10.3)
Chloride: 97 mmol/L — ABNORMAL LOW (ref 98–111)
Creatinine, Ser: 1.4 mg/dL — ABNORMAL HIGH (ref 0.61–1.24)
GFR calc Af Amer: 56 mL/min — ABNORMAL LOW (ref >60)
GFR calc non Af Amer: 48 mL/min — ABNORMAL LOW (ref >60)
Glucose, Bld: 143 mg/dL — ABNORMAL HIGH (ref 70–99)
Potassium: 4.6 mmol/L (ref 3.5–5.1)
Sodium: 131 mmol/L — ABNORMAL LOW (ref 135–145)

## 2018-04-09 LAB — GLUCOSE, CAPILLARY
Glucose-Capillary: 130 mg/dL — ABNORMAL HIGH (ref 70–99)
Glucose-Capillary: 113 mg/dL — ABNORMAL HIGH (ref 70–99)
Glucose-Capillary: 126 mg/dL — ABNORMAL HIGH (ref 70–99)
Glucose-Capillary: 142 mg/dL — ABNORMAL HIGH (ref 70–99)

## 2018-04-09 MED ORDER — SODIUM CHLORIDE 0.9 % IV SOLN
250.0000 mL | INTRAVENOUS | Status: DC | PRN
Start: 2018-04-09 — End: 2018-04-15

## 2018-04-09 MED ORDER — FUROSEMIDE 10 MG/ML IJ SOLN
40.0000 mg | Freq: Every day | INTRAMUSCULAR | Status: DC
Start: 2018-04-09 — End: 2018-04-15
  Administered 2018-04-09 – 2018-04-15 (×7): 40 mg via INTRAVENOUS
  Filled 2018-04-09 (×8): qty 4

## 2018-04-09 MED ORDER — SODIUM CHLORIDE 0.9% FLUSH
3.0000 mL | INTRAVENOUS | Status: DC | PRN
Start: 2018-04-09 — End: 2018-04-15

## 2018-04-09 MED ORDER — MENTHOL 3 MG MT LOZG
1.0000 | OROMUCOSAL | Status: DC | PRN
Start: 2018-04-09 — End: 2018-04-15
  Filled 2018-04-09: qty 9

## 2018-04-09 MED ORDER — SODIUM CHLORIDE 0.9% FLUSH
3.0000 mL | Freq: Two times a day (BID) | INTRAVENOUS | Status: DC
Start: 2018-04-09 — End: 2018-04-15
  Administered 2018-04-09 – 2018-04-15 (×9): 3 mL via INTRAVENOUS

## 2018-04-09 MED ORDER — MOVING RIGHT ALONG BOOK
Freq: Once | Status: AC
Start: 2018-04-09 — End: 2018-04-09
  Administered 2018-04-09: 13:00:00
  Filled 2018-04-09: qty 1

## 2018-04-09 NOTE — Progress Notes (Signed)
Patient arrived the unit from 2H placed on tele ccmd notified, patient assessment completedsee flowsheet, patient oriented to room and staff, bed in lowest position, call bell within reach will monitor.

## 2018-04-09 NOTE — Progress Notes (Signed)
Patient refuse NIV/CPAP for the night, patient states that it is very uncomfortable and wants to rather use his own mask once he is home. Pt requested to just have oxygen for the night. 2L of O2 given. No distress or complications noted
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
## 2018-04-10 ENCOUNTER — Inpatient Hospital Stay (HOSPITAL_COMMUNITY): Payer: Medicare HMO

## 2018-04-10 ENCOUNTER — Encounter

## 2018-04-10 LAB — CBC
HEMATOCRIT: 27.9 % — AB (ref 39.0–52.0)
Hemoglobin: 8.5 g/dL — ABNORMAL LOW (ref 13.0–17.0)
MCH: 26.6 pg (ref 26.0–34.0)
MCHC: 30.5 g/dL (ref 30.0–36.0)
MCV: 87.5 fL (ref 80.0–100.0)
Platelets: 358 K/uL (ref 150–400)
RBC: 3.19 MIL/uL — ABNORMAL LOW (ref 4.22–5.81)
RDW: 18.8 % — ABNORMAL HIGH (ref 11.5–15.5)
WBC: 9.4 K/uL (ref 4.0–10.5)
nRBC: 0.3 % — ABNORMAL HIGH (ref 0.0–0.2)

## 2018-04-10 LAB — BASIC METABOLIC PANEL
Anion gap: 11 (ref 5–15)
BUN: 19 mg/dL (ref 8–23)
CHLORIDE: 98 mmol/L (ref 98–111)
CO2: 25 mmol/L (ref 22–32)
Calcium: 8.2 mg/dL — ABNORMAL LOW (ref 8.9–10.3)
Creatinine, Ser: 1.42 mg/dL — ABNORMAL HIGH (ref 0.61–1.24)
GFR calc Af Amer: 55 mL/min — ABNORMAL LOW (ref >60)
GFR calc non Af Amer: 48 mL/min — ABNORMAL LOW (ref >60)
Glucose, Bld: 133 mg/dL — ABNORMAL HIGH (ref 70–99)
Potassium: 3 mmol/L — ABNORMAL LOW (ref 3.5–5.1)
Sodium: 134 mmol/L — ABNORMAL LOW (ref 135–145)

## 2018-04-10 LAB — GLUCOSE, CAPILLARY
Glucose-Capillary: 96 mg/dL (ref 70–99)
Glucose-Capillary: 114 mg/dL — ABNORMAL HIGH (ref 70–99)
Glucose-Capillary: 118 mg/dL — ABNORMAL HIGH (ref 70–99)
Glucose-Capillary: 161 mg/dL — ABNORMAL HIGH (ref 70–99)

## 2018-04-10 MED ORDER — POTASSIUM CHLORIDE CRYS ER 20 MEQ PO TBCR
40.0000 meq | Freq: Every day | ORAL | Status: DC
Start: 2018-04-10 — End: 2018-04-10

## 2018-04-10 MED ORDER — POTASSIUM CHLORIDE CRYS ER 10 MEQ PO TBCR
30.0000 meq | Freq: Two times a day (BID) | ORAL | Status: AC
Start: 2018-04-10 — End: 2018-04-10
  Administered 2018-04-10 (×2): 30 meq via ORAL
  Filled 2018-04-10 (×2): qty 1

## 2018-04-10 MED ORDER — METOPROLOL TARTRATE 25 MG PO TABS
25.0000 mg | Freq: Two times a day (BID) | ORAL | Status: DC
Start: 2018-04-10 — End: 2018-04-15
  Administered 2018-04-10 – 2018-04-15 (×11): 25 mg via ORAL
  Filled 2018-04-10 (×11): qty 1

## 2018-04-10 MED ORDER — INSULIN DETEMIR 100 UNIT/ML ~~LOC~~ SOLN
10.0000 [IU] | Freq: Two times a day (BID) | SUBCUTANEOUS | Status: DC
Start: 2018-04-10 — End: 2018-04-15
  Administered 2018-04-10 – 2018-04-15 (×10): 10 [IU] via SUBCUTANEOUS
  Filled 2018-04-10 (×11): qty 0.1

## 2018-04-10 MED ORDER — POTASSIUM CHLORIDE CRYS ER 20 MEQ PO TBCR
40.0000 meq | Freq: Every day | ORAL | Status: DC
Start: 2018-04-11 — End: 2018-04-15
  Administered 2018-04-11 – 2018-04-15 (×5): 40 meq via ORAL
  Filled 2018-04-10 (×5): qty 2

## 2018-04-10 MED ORDER — AMIODARONE HCL 200 MG PO TABS
200.0000 mg | Freq: Two times a day (BID) | ORAL | Status: DC
Start: 2018-04-10 — End: 2018-04-15
  Administered 2018-04-10 – 2018-04-15 (×11): 200 mg via ORAL
  Filled 2018-04-10 (×11): qty 1

## 2018-04-10 NOTE — Progress Notes (Signed)
Pt ambulated x 470 feet, with front wheel walker will continue to monitor

## 2018-04-10 NOTE — Evaluation (Addendum)
Physical Therapy Evaluation Patient Details Name: Calvin FantasiaLarry F Price MRN: [id number] DOB: 1943-02-15 Today's Date: 04/10/2018   History of Present Illness  Pt is a 72 y.o. M with significant PMH of CAD, HTN who presents s/p coronary artery bypass graft x 4.  Clinical Impression  Patient admitted with above diagnosis. Patient is very pleasant and motivated to participate in therapy evaluation. On PT evaluation, patient presenting with decreased functional mobility secondary to decreased endurance. Ambulating limited hallway distances with walker and supervision. SpO2 96% on RA, HR stable in 70s. Requiring cues for maintaining sternal precautions. Reviewed HEP for postural re-education and strengthening and provided written handout. Recommending short term SNF to maximize functional independence as patient lives alone and does not have caregiver support.    Follow Up Recommendations ST SNF; 24/7 Supervision    Equipment Recommendations  None recommended by PT    Recommendations for Other Services       Precautions / Restrictions Precautions Precautions: Sternal Precaution Booklet Issued: No Precaution Comments: Verbally reviewed Restrictions Weight Bearing Restrictions: Yes Other Position/Activity Restrictions: sternal precautions      Mobility  Bed Mobility               General bed mobility comments: OOB in chair  Transfers Overall transfer level: Needs assistance Equipment used: Rolling walker (2 wheeled) Transfers: Sit to/from Stand Sit to Stand: Supervision         General transfer comment: min cues for hand placement for maintenance of sternal precautions  Ambulation/Gait Ambulation/Gait assistance: Supervision Gait Distance (Feet): 400 Feet Assistive device: Rolling walker (2 wheeled) Gait Pattern/deviations: Step-through pattern;Decreased stride length;Wide base of support   Gait velocity interpretation: 1.31 - 2.62 ft/sec, indicative of limited community  ambulator General Gait Details: Pt utilizing wide BOS with foot external rotation but no gross unsteadiness  Stairs            Wheelchair Mobility    Modified Rankin (Stroke Patients Only)       Balance Overall balance assessment: No apparent balance deficits (not formally assessed)                                           Pertinent Vitals/Pain Pain Assessment: Faces Faces Pain Scale: Hurts a little bit Pain Location: generalized Pain Descriptors / Indicators: Discomfort Pain Intervention(s): Monitored during session    Home Living Family/patient expects to be discharged to:: Private residence Living Arrangements: Alone Available Help at Discharge: Family;Available PRN/intermittently(children in [place]) Type of Home: Other(Comment)(condo) Home Access: Stairs to enter Entrance Stairs-Rails: Right Entrance Stairs-Number of Steps: 2 Home Layout: Laundry or work area in basement;Able to live on main level with bedroom/bathroom Home Equipment: Environmental consultantWalker - 2 wheels;Cane - single point;Wheelchair - manual      Prior Function Level of Independence: Independent         Comments: enjoys Product managergolfing     Hand Dominance        Extremity/Trunk Assessment   Upper Extremity Assessment Upper Extremity Assessment: Overall WFL for tasks assessed    Lower Extremity Assessment Lower Extremity Assessment: Overall WFL for tasks assessed    Cervical / Trunk Assessment Cervical / Trunk Assessment: Normal  Communication   Communication: No difficulties  Cognition Arousal/Alertness: Awake/alert Behavior During Therapy: WFL for tasks assessed/performed Overall Cognitive Status: Within Functional Limits for tasks assessed  General Comments      Exercises Other Exercises Other Exercises: AROM Cervical rotation x 5 Other Exercises: Sit to Stands x 3 Other Exercises: Reviewed standing bilateral  shoulder shrugs, unilateral shoulder shrugs, standing hip abduction with hand support, supine heel slides   Assessment/Plan    PT Assessment Patient needs continued PT services  PT Problem List Decreased activity tolerance;Decreased mobility;Cardiopulmonary status limiting activity       PT Treatment Interventions DME instruction;Gait training;Stair training;Functional mobility training;Therapeutic activities;Therapeutic exercise;Balance training;Patient/family education    PT Goals (Current goals can be found in the Care Plan section)  Acute Rehab PT Goals Patient Stated Goal: "get back to golf." PT Goal Formulation: With patient Time For Goal Achievement: 04/24/18 Potential to Achieve Goals: Good    Frequency Min 3X/week   Barriers to discharge Decreased caregiver support      Co-evaluation               AM-PAC PT "6 Clicks" Mobility  Outcome Measure Help needed turning from your back to your side while in a flat bed without using bedrails?: None Help needed moving from lying on your back to sitting on the side of a flat bed without using bedrails?: A Little Help needed moving to and from a bed to a chair (including a wheelchair)?: None Help needed standing up from a chair using your arms (e.g., wheelchair or bedside chair)?: None Help needed to walk in hospital room?: None Help needed climbing 3-5 steps with a railing? : A Little 6 Click Score: 22    End of Session   Activity Tolerance: Patient tolerated treatment well Patient left: in chair;with call bell/phone within reach;with family/visitor present   PT Visit Diagnosis: Difficulty in walking, not elsewhere classified (R26.2)    Time: 1610-9604 PT Time Calculation (min) (ACUTE ONLY): 28 min   Charges:   PT Evaluation $PT Eval Moderate Complexity: 1 Mod PT Treatments $Therapeutic Exercise: 8-22 mins        [name], PT, DPT Acute Rehabilitation Services Pager [phone] Office  [phone]   [name] 04/10/2018, 4:12 PM

## 2018-04-10 NOTE — Progress Notes (Addendum)
      [address]       [place] [id number]             [phone]        10 Days Post-Op Procedure(s) (LRB): CORONARY ARTERY BYPASS GRAFTING (CABG) TIMES FOUR USING LEFT INTERNAL MAMMARY ARTERY AND RIGHT GREATER SAPHENOUS VEIN HARVESTED ENDOSCOPICALLY. (N/A) TRANSESOPHAGEAL ECHOCARDIOGRAM (TEE) (N/A)  Subjective: Patient just returned from x ray. He is continuing to feel better. He has no specific complaint this am.  Objective: Vital signs in last 24 hours: Temp:  [97.7 F (36.5 C)-98.4 F (36.9 C)] 98.4 F (36.9 C) (12/12 0418) Pulse Rate:  [79-114] 90 (12/12 0419) Cardiac Rhythm: Normal sinus rhythm (12/12 0418) Resp:  [13-20] 13 (12/12 0419) BP: (110-153)/(56-127) 131/81 (12/12 0419) SpO2:  [90 %-100 %] 92 % (12/12 0419) Weight:  [132 kg] 132 kg (12/12 0419)  Pre op weight 126 kg Current Weight  04/10/18 132 kg       Intake/Output from previous day: 12/11 0701 - 12/12 0700 In: 840 [P.O.:840] Out: -    Physical Exam:  Cardiovascular: RRR Pulmonary: Slightly diminished at bases Abdomen: Soft, obese, non tender, bowel sounds present. Extremities: Mild bilateral lower extremity edema. Wounds: Sternal wound is clean and dry.  No erythema or signs of infection. Stab wound right lower leg continues to drain sero sanguinous fluid.  Lab Results: CBC: Recent Labs    04/09/18 0714 04/10/18 0254  WBC 9.0 9.4  HGB 8.9* 8.5*  HCT 28.7* 27.9*  PLT 365 358   BMET:  Recent Labs    04/09/18 0714 04/10/18 0254  NA 131* 134*  K 4.6 3.0*  CL 97* 98  CO2 22 25  GLUCOSE 143* 133*  BUN 25* 19  CREATININE 1.40* 1.42*  CALCIUM 8.3* 8.2*    PT/INR:  Lab Results  Component Value Date   INR 1.06 04/08/2018   INR 1.37 03/31/2018   INR 0.96 03/26/2018   ABG:  INR: Will add last result for INR, ABG once components are confirmed Will add last 4 CBG results once components are confirmed  Assessment/Plan: 1. CV-Previous a fib.  Rapid A paced  previously. He has been maintaining SR. On Amiodarone 400 mg bid and Lopressor 12.5 mg bid. Will increase Lopressor to 25 mg bid and decrease Amiodarone to 200 mg bid 2. Pulmonary- On 2 liters of oxygen via . Wean as able. CXR this am appears to show cardiomegaly, bibasilar atelectasis, and small pleural effusions.  On room air. Encourage incentive spirometer and flutter valve 3. Volume overload-on Lasix 40 mg IV daily with good diuresis 4. ABL anemia-H and H yesterday increased to 8.5 and 27.9  Has had previous transfusion. Continue Trinsicon  5. DM-CBGs 126/142/96. On Insulin. Was on Metformin prior to admission and will likely restart at discharge. Pre op HGA1C 7.1 6. AKI-Creatinine this am 1.42  7. Hyponatremia-sodium this am up to 134. On fluid restriction 8. Urinary retention-in and out cath'd previously. On Flomax and able to void on his own. 9. Supplement potassium 10. OSA-does not use CPAP at home like he should 11. Will need SNF when ready for discharge   Lelon HuhDonielle M [facility] LLCZimmermanPA-C 04/10/2018,7:32 AM [phone] Chest x-ray is clear Surgical incisions clean and dry Patient ready for discharge to skilled nursing facility Maintaining sinus rhythm-patient rapid atrially paced out of postoperative atrial  flutter patient examined and medical record reviewed,agree with above note. Kathlee Nationseter Van Trigt III 04/10/2018

## 2018-04-10 NOTE — Care Management Important Message (Signed)
Important Message  Patient Details  Name: [name] MRN: [id number] Date of Birth: [birth year]   Medicare Important Message Given:  Yes    Oralia RudMegan P Levar Fayson 04/10/2018, 2:38 PM

## 2018-04-10 NOTE — Progress Notes (Signed)
CARDIAC REHAB PHASE I   PRE:  Rate/Rhythm: 84 SR                    At door    SaO2: 98%RA  MODE:  Ambulation: 400 ft   POST:  Rate/Rhythm: 85 SR  BP:  Supine:   Sitting: 136/72  Standing:    SaO2: 95%RA 1050-1112 Came to see pt at 1020 but he needed a few minutes to rest. Came back at 1050 and pt waiting in chair at door to walk. Pt walked 400 ft on RA with rolling walker with steady gait. Discussed with pt using IS and flutter valve. Discussed CRP 2 and [facility]. Discussed with pt sternal precautions and staying in the tube.  To bed after walk.   Luetta Nuttingharlene Darcelle Herrada, RN BSN  04/10/2018 11:21 AM

## 2018-04-11 LAB — BASIC METABOLIC PANEL
Anion gap: 11 (ref 5–15)
BUN: 15 mg/dL (ref 8–23)
CO2: 26 mmol/L (ref 22–32)
Calcium: 8.2 mg/dL — ABNORMAL LOW (ref 8.9–10.3)
Chloride: 96 mmol/L — ABNORMAL LOW (ref 98–111)
Creatinine, Ser: 1.29 mg/dL — ABNORMAL HIGH (ref 0.61–1.24)
GFR calc Af Amer: >60 mL/min (ref >60)
GFR calc non Af Amer: 53 mL/min — ABNORMAL LOW (ref >60)
GLUCOSE: 125 mg/dL — AB (ref 70–99)
Potassium: 3.2 mmol/L — ABNORMAL LOW (ref 3.5–5.1)
Sodium: 133 mmol/L — ABNORMAL LOW (ref 135–145)

## 2018-04-11 LAB — GLUCOSE, CAPILLARY
Glucose-Capillary: 134 mg/dL — ABNORMAL HIGH (ref 70–99)
Glucose-Capillary: 125 mg/dL — ABNORMAL HIGH (ref 70–99)
Glucose-Capillary: 124 mg/dL — ABNORMAL HIGH (ref 70–99)
Glucose-Capillary: 167 mg/dL — ABNORMAL HIGH (ref 70–99)

## 2018-04-11 MED ORDER — POTASSIUM CHLORIDE CRYS ER 20 MEQ PO TBCR
40.0000 meq | Freq: Once | ORAL | Status: AC
Start: 2018-04-11 — End: 2018-04-11
  Administered 2018-04-11: 40 meq via ORAL
  Filled 2018-04-11: qty 2

## 2018-04-11 MED ORDER — POTASSIUM CHLORIDE CRYS ER 20 MEQ PO TBCR
30.0000 meq | Freq: Once | ORAL | Status: AC
Start: 2018-04-11 — End: 2018-04-11
  Administered 2018-04-11: 30 meq via ORAL
  Filled 2018-04-11: qty 1

## 2018-04-11 NOTE — Progress Notes (Signed)
[facility]. Patient was informed [facility] did not have availability. Patient states he will review SNF lisiting provided from medicare.gov website and inform the CSW of his decision.   Antony Blackbirdynthia Kayode Petion, [facility] HospitalCSWA Clinical Social Worker [phone]

## 2018-04-11 NOTE — Progress Notes (Signed)
Pt in bed with family at beside. Pt politely declined walk. Pt states he is nauseated and not feeing well. RN is aware.  Cardiac Rehab will follow up with pt tomorrow.

## 2018-04-11 NOTE — Clinical Social Work Note (Signed)
Clinical Social Work Assessment  Patient Details  Name: Calvin FantasiaLarry F Price MRN: [id number] Date of Birth: December 01, 1942  Date of referral:  04/11/18               Reason for consult:  Facility Placement                Permission sought to share information with:  Family Supports, Magazine features editoracility Contact Representative Permission granted to share information::  Yes, Verbal Permission Granted  Name::     Selinda OrionJohn Casler   Agency::  SNFs  Relationship::  son  Contact Information:  [phone]  Housing/Transportation Living arrangements for the past 2 months:  Single Family Home Source of Information:  Patient, Adult Children Patient Interpreter Needed:  None Criminal Activity/Legal Involvement Pertinent to Current Situation/Hospitalization:  No - Comment as needed Significant Relationships:  Adult Children Lives with:  Self Do you feel safe going back to the place where you live?  No Need for family participation in patient care:  Yes (Comment)  Care giving concerns: CSW received consult for discharge needs. CSW spoke with patient regarding PT recommendation of SNF placement at time of discharge. Patient's son, Jonny RuizJohn, was with the patient at bedside.  Patient reports he lives alone. Patient states his children works but they will be assisting him once he has gotten stronger and more independent.     Social Worker assessment / plan:  CSW spoke with patient [facility].   Employment status:  Retired Health and safety inspectornsurance information:  Medicare PT Recommendations:  Skilled Nursing Facility Information / Referral to community resources:  Skilled Nursing Facility  Patient/Family's Response to care:  Patient recognizes need for rehab before returning home and is agree[place]. Patient reported preference for [facility].    Patient/Family's Understanding of and Emotional Response to Diagnosis, Current Treatment, and Prognosis:  Patient is  realistic regarding therapy needs and expressed being hopeful for SNF placement. Patient expressed understanding of CSW role and discharge process as well as medical condition. No questions/concerns about plan or treatment.    Emotional Assessment Appearance:  Appears stated age Attitude/Demeanor/Rapport:  Engaged Affect (typically observed):  Accepting, Appropriate Orientation:  Oriented to Self, Oriented to Place, Oriented to  Time, Oriented to Situation Alcohol / Substance use:  Not Applicable Psych involvement (Current and /or in the community):  No (Comment)  Discharge Needs  Concerns to be addressed:  Care Coordination Readmission within the last 30 days:  No Current discharge risk:  Dependent with Mobility Barriers to Discharge:  Continued Medical Work up   Enterprise ProductsCynthia N Randee Upchurch, LCSWA 04/11/2018, 2:28 PM

## 2018-04-11 NOTE — Progress Notes (Addendum)
      [address]       [place] [id number]             [phone]        11 Days Post-Op Procedure(s) (LRB): CORONARY ARTERY BYPASS GRAFTING (CABG) TIMES FOUR USING LEFT INTERNAL MAMMARY ARTERY AND RIGHT GREATER SAPHENOUS VEIN HARVESTED ENDOSCOPICALLY. (N/A) TRANSESOPHAGEAL ECHOCARDIOGRAM (TEE) (N/A)  Subjective: Patient just finished using flutter valve. He states he keeps improving.  Objective: Vital signs in last 24 hours: Temp:  [97.4 F (36.3 C)-98 F (36.7 C)] 97.8 F (36.6 C) (12/13 0424) Pulse Rate:  [72-84] 79 (12/13 0426) Cardiac Rhythm: Normal sinus rhythm (12/13 0200) Resp:  [12-17] 17 (12/13 0426) BP: (120-136)/(57-72) 120/61 (12/13 0426) SpO2:  [95 %-100 %] 98 % (12/13 0426) Weight:  [131.3 kg] 131.3 kg (12/13 0426)  Pre op weight 126 kg Current Weight  04/11/18 131.3 kg       Intake/Output from previous day: 12/12 0701 - 12/13 0700 In: 660 [P.O.:660] Out: -    Physical Exam:  Cardiovascular: RRR Pulmonary: Slightly diminished at bases Abdomen: Soft, obese, non tender, bowel sounds present. Extremities: Mild bilateral lower extremity edema but is decreasing. Wounds: Sternal wound is clean and dry.  No erythema or signs of infection. Stab wound right lower leg continues to drain sero sanguinous fluid but no sign of infection.  Lab Results: CBC: Recent Labs    04/09/18 0714 04/10/18 0254  WBC 9.0 9.4  HGB 8.9* 8.5*  HCT 28.7* 27.9*  PLT 365 358   BMET:  Recent Labs    04/10/18 0254 04/11/18 0343  NA 134* 133*  K 3.0* 3.2*  CL 98 96*  CO2 25 26  GLUCOSE 133* 125*  BUN 19 15  CREATININE 1.42* 1.29*  CALCIUM 8.2* 8.2*    PT/INR:  Lab Results  Component Value Date   INR 1.06 04/08/2018   INR 1.37 03/31/2018   INR 0.96 03/26/2018   ABG:  INR: Will add last result for INR, ABG once components are confirmed Will add last 4 CBG results once components are confirmed  Assessment/Plan: 1. CV-Previous a fib.   Rapid A paced previously with return to sinus rhythm. He continues to maintain SR, second degree heart block. On Amiodarone 200 mg bid and Lopressor 25 mg bid.  2. Pulmonary- On room air. Encourage incentive spirometer and flutter valve 3. Volume overload-on Lasix 40 mg IV daily and hope to change to oral soon 4. ABL anemia-H and H yesterday increased to 8.5 and 27.9  Has had previous transfusion. Continue Trinsicon  5. DM-CBGs 118/161/134. On Insulin. Was on Metformin prior to admission and will  restart at discharge. Pre op HGA1C 7.1 6. AKI-Creatinine this am decreased to 1.29 7. Mild hyponatremia-sodium this am 133. On fluid restriction 8. Urinary retention-in and out cath'd previously. On Flomax and able to void on his own. 9. Supplement potassium 10. OSA-does not use CPAP at home like he should 11. Will need SNF when ready for discharge. [facility]   Donielle M [facility]immermanPA-C 04/11/2018,6:14 AM [phone]  Patient ready for SNF when bed available Agree with above assessment patient examined and medical record reviewed,agree with above note. Kathlee Nationseter Van Trigt III 04/11/2018

## 2018-04-11 NOTE — Evaluation (Signed)
Occupational Therapy Evaluation Patient Details Name: [name] MRN: [id number] DOB: 01/29/1943 Today's Date: 04/11/2018    History of Present Illness Pt is a 72 y.o. M with significant PMH of CAD, HTN who presents s/p coronary artery bypass graft x 4.   Clinical Impression   Pt is typically independent and enjoy golfing. Pt presents with decreased activity tolerance and needs reminders to adhere to sternal precautions. He lives alone. Recommending ST rehab prior to return home. Will follow acutely.    Follow Up Recommendations  SNF    Equipment Recommendations  3 in 1 bedside commode    Recommendations for Other Services       Precautions / Restrictions Precautions Precautions: Sternal Precaution Comments: reviewed sternal precautions related to ADL and IADL      Mobility Bed Mobility               General bed mobility comments: OOB in chair  Transfers Overall transfer level: Needs assistance Equipment used: Rolling walker (2 wheeled) Transfers: Sit to/from Stand Sit to Stand: Supervision         General transfer comment: min cues for hand placement for maintenance of sternal precautions    Balance Overall balance assessment: No apparent balance deficits (not formally assessed)                                         ADL either performed or assessed with clinical judgement   ADL Overall ADL's : Needs assistance/impaired Eating/Feeding: Independent;Sitting   Grooming: Wash/dry hands;Standing;Supervision/safety   Upper Body Bathing: Set up;Sitting   Lower Body Bathing: Supervison/ safety;Sit to/from stand   Upper Body Dressing : Set up;Sitting   Lower Body Dressing: Supervision/safety;Sit to/from stand   Toilet Transfer: Supervision/safety;Ambulation;RW   Toileting- Clothing Manipulation and Hygiene: Supervision/safety;Sit to/from stand       Functional mobility during ADLs: Supervision/safety;Rolling walker General  ADL Comments: Pt able to cross foot over opposite knee.     Vision Baseline Vision/History: Wears glasses Wears Glasses: Reading only Patient Visual Report: No change from baseline       Perception     Praxis      Pertinent Vitals/Pain Pain Assessment: No/denies pain     Hand Dominance Right   Extremity/Trunk Assessment Upper Extremity Assessment Upper Extremity Assessment: Overall WFL for tasks assessed   Lower Extremity Assessment Lower Extremity Assessment: Defer to PT evaluation   Cervical / Trunk Assessment Cervical / Trunk Assessment: Other exceptions(obesity)   Communication Communication Communication: No difficulties   Cognition Arousal/Alertness: Awake/alert Behavior During Therapy: WFL for tasks assessed/performed Overall Cognitive Status: Within Functional Limits for tasks assessed                                     General Comments       Exercises     Shoulder Instructions      Home Living Family/patient expects to be discharged to:: Private residence Living Arrangements: Alone Available Help at Discharge: Family;Available PRN/intermittently Type of Home: Other(Comment)(condo) Home Access: Stairs to enter Entrance Stairs-Number of Steps: 2 Entrance Stairs-Rails: Right Home Layout: Laundry or work area in basement;Able to live on main level with bedroom/bathroom     Bathroom Shower/Tub: Producer, television/film/video: Standard     Home Equipment: Environmental consultant - 2 wheels;Cane - single  point;Wheelchair - manual          Prior Functioning/Environment Level of Independence: Independent        Comments: enjoys golfing        OT Problem List: Decreased activity tolerance      OT Treatment/Interventions: Self-care/ADL training;Energy conservation    OT Goals(Current goals can be found in the care plan section) Acute Rehab OT Goals Patient Stated Goal: "get back to golf." OT Goal Formulation: With patient Time For  Goal Achievement: 04/25/18 Potential to Achieve Goals: Good ADL Goals Pt Will Perform Tub/Shower Transfer: Tub transfer;with supervision;ambulating;3 in 1 Additional ADL Goal #1: Pt will gather items necessary for ADL around his room modified independently. Additional ADL Goal #2: Pt will adhere to sternal precautions during ADL and mobility consistently. Additional ADL Goal #3: Pt will generalize energy conservation strategies during ADL and mobility independently.  OT Frequency: Min 2X/week   Barriers to D/C: Decreased caregiver support          Co-evaluation              AM-PAC OT "6 Clicks" Daily Activity     Outcome Measure Help from another person eating meals?: None Help from another person taking care of personal grooming?: A Little Help from another person toileting, which includes using toliet, bedpan, or urinal?: A Little Help from another person bathing (including washing, rinsing, drying)?: A Little Help from another person to put on and taking off regular upper body clothing?: None Help from another person to put on and taking off regular lower body clothing?: A Little 6 Click Score: 20   End of Session Equipment Utilized During Treatment: Gait belt;Rolling walker  Activity Tolerance: Patient tolerated treatment well Patient left: in chair;with call bell/phone within reach;with family/visitor present  OT Visit Diagnosis: Other (comment)(decreased activity tolerance)                Time: 4782-95621242-1302 OT Time Calculation (min): 20 min Charges:  OT General Charges $OT Visit: 1 Visit OT Evaluation $OT Eval Moderate Complexity: 1 Mod  Martie RoundJulie Laelle Bridgett, OTR/L Acute Rehabilitation Services Pager: [phone] Office: [phone]  Evern BioMayberry, Shykeem Resurreccion Lynn 04/11/2018, 2:06 PM

## 2018-04-11 NOTE — [place] FL2 (Signed)
[place] MEDICAID FL2 LEVEL OF CARE SCREENING TOOL     IDENTIFICATION  Patient Name: [name] Birthdate: Aug 24, 1942 Sex: male Admission Date (Current Location): 03/31/2018  [facility] and [place] Number:  Producer, television/film/video and Address:  [facility] [place], [address] [place], [place] [address]      Provider Number: [id number]  Attending Physician Name and Address:  Kerin Perna, MD  Relative Name and Phone Number:  Hendricks Schwandt [phone]    Current Level of Care: Hospital Recommended Level of Care: Skilled Nursing Facility Prior Approval Number:    Date Approved/Denied:   PASRR Number: [id number]  Discharge Plan: SNF    Current Diagnoses: Patient Active Problem List   Diagnosis Date Noted  . Coronary artery disease 03/31/2018  . CAD (coronary artery disease)   . Hypertension   . Essential hypertension 02/25/2018  . Hyperlipidemia 02/25/2018  . Chest pain 02/25/2018    Orientation RESPIRATION BLADDER Height & Weight     Self, Time, Situation, Place  Normal Continent Weight: 289 lb 7.4 oz (131.3 kg) Height:  5\' 11"  (180.3 cm)  BEHAVIORAL SYMPTOMS/MOOD NEUROLOGICAL BOWEL NUTRITION STATUS      Continent Diet(please see discharge summary)  AMBULATORY STATUS COMMUNICATION OF NEEDS Skin     Verbally Surgical wounds(incision(closed), chest, incision(closed) leg,distal, right,lower, incision(closed) leg anterior,right mid)                       Personal Care Assistance Level of Assistance  Bathing, Feeding, Dressing Bathing Assistance: Limited assistance Feeding assistance: Independent Dressing Assistance: Limited assistance     Functional Limitations Info  Sight, Hearing, Speech Sight Info: Adequate Hearing Info: Adequate Speech Info: Adequate    SPECIAL CARE FACTORS FREQUENCY  PT (By licensed PT), OT (By licensed OT)     PT Frequency: 3x per week OT Frequency: 3x per week            Contractures  Contractures Info: Not present    Additional Factors Info  Code Status, Allergies Code Status Info: Full Code  Allergies Info: Ciprofloxacin, Erythromycin Ethylsuccinate           Current Medications (04/11/2018):  This is the current hospital active medication list Current Facility-Administered Medications  Medication Dose Route Frequency Provider Last Rate Last Dose  . 0.9 %  sodium chloride infusion  250 mL Intravenous Continuous Doree Fudge M, PA-C      . 0.9 %  sodium chloride infusion   Intravenous PRN Ardelle Balls, PA-C   Stopped at 04/03/18 0631  . 0.9 %  sodium chloride infusion  250 mL Intravenous PRN Doree Fudge M, PA-C      . amiodarone (PACERONE) tablet 200 mg  200 mg Oral BID Doree Fudge M, PA-C   200 mg at 04/11/18 8469  . aspirin EC tablet 325 mg  325 mg Oral Daily Doree Fudge M, [place]   325 mg at 04/11/18 6295   Or  . aspirin chewable tablet 324 mg  324 mg Per Tube Daily Joycelyn Man, Donielle M, PA-C      . bisacodyl (DULCOLAX) EC tablet 10 mg  10 mg Oral Daily Doree Fudge M, PA-C   10 mg at 04/08/18 2841  . docusate sodium (COLACE) capsule 200 mg  200 mg Oral Daily Doree Fudge M, PA-C   200 mg at 04/11/18 3244  . enoxaparin (LOVENOX) injection 40 mg  40 mg Subcutaneous Q24H Doree Fudge M, PA-C   40  mg at 04/10/18 1800  . furosemide (LASIX) injection 40 mg  40 mg Intravenous Daily Doree FudgeZimmerman, Donielle M, PA-C   40 mg at 04/11/18 [id number]  . insulin aspart (novoLOG) injection 0-15 Units  0-15 Units Subcutaneous TID WC Ardelle BallsZimmerman, Donielle M, PA-C   2 Units at 04/11/18 1202  . insulin aspart (novoLOG) injection 0-5 Units  0-5 Units Subcutaneous QHS Joycelyn ManZimmerman, Donielle M, PA-C      . insulin detemir (LEVEMIR) injection 10 Units  10 Units Subcutaneous BID Ardelle BallsZimmerman, Donielle M, PA-C   10 Units at 04/11/18 [id number]  . loratadine (CLARITIN) tablet 10 mg  10 mg Oral Daily Doree FudgeZimmerman, Donielle M, PA-C   10 mg at 04/11/18 [id number]  .  menthol-cetylpyridinium (CEPACOL) lozenge 3 mg  1 lozenge Oral PRN Carney, Gwenlyn FoundJessica C, RPH      . metoCLOPramide (REGLAN) injection 10 mg  10 mg Intravenous Q6H Doree FudgeZimmerman, Donielle M, PA-C   10 mg at 04/11/18 1203  . metoprolol tartrate (LOPRESSOR) injection 2.5-5 mg  2.5-5 mg Intravenous Q2H PRN Doree FudgeZimmerman, Donielle M, PA-C      . metoprolol tartrate (LOPRESSOR) tablet 25 mg  25 mg Oral BID Doree FudgeZimmerman, Donielle M, PA-C   25 mg at 04/11/18 [id number]  . ondansetron (ZOFRAN) injection 4 mg  4 mg Intravenous Q6H PRN Doree FudgeZimmerman, Donielle M, PA-C   4 mg at 04/07/18 1849  . oxyCODONE (Oxy IR/ROXICODONE) immediate release tablet 5-10 mg  5-10 mg Oral Q3H PRN Ardelle BallsZimmerman, Donielle M, PA-C   5 mg at 04/11/18 0946  . pantoprazole (PROTONIX) EC tablet 40 mg  40 mg Oral Daily Doree FudgeZimmerman, Donielle M, PA-C   40 mg at 04/11/18 [id number]  . potassium chloride (K-DUR,KLOR-CON) CR tablet 30 mEq  30 mEq Oral Once Doree FudgeZimmerman, Donielle M, PA-C      . potassium chloride SA (K-DUR,KLOR-CON) CR tablet 40 mEq  40 mEq Oral Daily Donata ClayVan Trigt, Theron AristaPeter, MD   40 mEq at 04/11/18 0925  . pravastatin (PRAVACHOL) tablet 40 mg  40 mg Oral QHS Doree FudgeZimmerman, Donielle M, PA-C   40 mg at 04/10/18 2100  . sodium chloride flush (NS) 0.9 % injection 3 mL  3 mL Intravenous Q12H Doree FudgeZimmerman, Donielle M, PA-C   3 mL at 04/11/18 0926  . sodium chloride flush (NS) 0.9 % injection 3 mL  3 mL Intravenous PRN Doree FudgeZimmerman, Donielle M, PA-C      . sodium chloride flush (NS) 0.9 % injection 3 mL  3 mL Intravenous Q12H Doree FudgeZimmerman, Donielle M, PA-C   3 mL at 04/11/18 0926  . sodium chloride flush (NS) 0.9 % injection 3 mL  3 mL Intravenous PRN Doree FudgeZimmerman, Donielle M, PA-C      . tamsulosin [facility](FLOMAX) capsule 0.4 mg  0.4 mg Oral QPC breakfast Doree FudgeZimmerman, Donielle M, PA-C   0.4 mg at 04/11/18 [id number]     Discharge Medications: Please see discharge summary for a list of discharge medications.  Relevant Imaging Results:  Relevant Lab Results:   Additional Information SSN#  [id number]  Eduard Rouxynthia N Hava Massingale, LCSWA

## 2018-04-12 LAB — BASIC METABOLIC PANEL
Anion gap: 12 (ref 5–15)
BUN: 12 mg/dL (ref 8–23)
CO2: 24 mmol/L (ref 22–32)
Calcium: 8.3 mg/dL — ABNORMAL LOW (ref 8.9–10.3)
Chloride: 98 mmol/L (ref 98–111)
Creatinine, Ser: 1.49 mg/dL — ABNORMAL HIGH (ref 0.61–1.24)
GFR calc Af Amer: 52 mL/min — ABNORMAL LOW (ref >60)
GFR calc non Af Amer: 45 mL/min — ABNORMAL LOW (ref >60)
Glucose, Bld: 112 mg/dL — ABNORMAL HIGH (ref 70–99)
Potassium: 3.8 mmol/L (ref 3.5–5.1)
SODIUM: 134 mmol/L — AB (ref 135–145)

## 2018-04-12 LAB — GLUCOSE, CAPILLARY
Glucose-Capillary: 115 mg/dL — ABNORMAL HIGH (ref 70–99)
Glucose-Capillary: 155 mg/dL — ABNORMAL HIGH (ref 70–99)
Glucose-Capillary: 97 mg/dL (ref 70–99)
Glucose-Capillary: 143 mg/dL — ABNORMAL HIGH (ref 70–99)

## 2018-04-12 NOTE — Progress Notes (Addendum)
[address]       [place] [id number]             [phone]      12 Days Post-Op Procedure(s) (LRB): CORONARY ARTERY BYPASS GRAFTING (CABG) TIMES FOUR USING LEFT INTERNAL MAMMARY ARTERY AND RIGHT GREATER SAPHENOUS VEIN HARVESTED ENDOSCOPICALLY. (N/A) TRANSESOPHAGEAL ECHOCARDIOGRAM (TEE) (N/A) Subjective: Feels ok overall, making slow/steady progress  Objective: Vital signs in last 24 hours: Temp:  [97.3 F (36.3 C)-98.9 F (37.2 C)] 98.9 F (37.2 C) (12/14 0324) Pulse Rate:  [34-81] 75 (12/14 0324) Cardiac Rhythm: Normal sinus rhythm (12/14 0700) Resp:  [15-18] 15 (12/14 0612) BP: (119-137)/(50-77) 119/50 (12/14 0324) SpO2:  [96 %-100 %] 96 % (12/14 0324) Weight:  [130.6 kg] 130.6 kg (12/14 0612)  Hemodynamic parameters for last 24 hours:    Intake/Output from previous day: 12/13 0701 - 12/14 0700 In: 540 [P.O.:540] Out: -  Intake/Output this shift: No intake/output data recorded.  General appearance: alert, cooperative and no distress Heart: regular rate and rhythm Lungs: mildly dim in the bases Abdomen: obesem soft, nontender Extremities: + edema Wound: small amt of drainage RLE EVH stab site  Lab Results: Recent Labs    04/10/18 0254  WBC 9.4  HGB 8.5*  HCT 27.9*  PLT 358   BMET:  Recent Labs    04/11/18 0343 04/12/18 0346  NA 133* 134*  K 3.2* 3.8  CL 96* 98  CO2 26 24  GLUCOSE 125* 112*  BUN 15 12  CREATININE 1.29* 1.49*  CALCIUM 8.2* 8.3*    PT/INR: No results for input(s): LABPROT, INR in the last 72 hours. ABG    Component Value Date/Time   PHART 7.316 (L) 04/02/2018 0649   HCO3 21.5 04/02/2018 0649   TCO2 23 04/02/2018 0649   ACIDBASEDEF 4.0 (H) 04/02/2018 0649   O2SAT 56.5 04/07/2018 0340   CBG (last 3)  Recent Labs    04/11/18 1510 04/11/18 2055 04/12/18 0615  GLUCAP 124* 167* 115*    Meds Scheduled Meds: . amiodarone  200 mg Oral BID  . aspirin EC  325 mg Oral Daily   Or  . aspirin  324 mg  Per Tube Daily  . bisacodyl  10 mg Oral Daily  . docusate sodium  200 mg Oral Daily  . enoxaparin (LOVENOX) injection  40 mg Subcutaneous Q24H  . furosemide  40 mg Intravenous Daily  . insulin aspart  0-15 Units Subcutaneous TID WC  . insulin aspart  0-5 Units Subcutaneous QHS  . insulin detemir  10 Units Subcutaneous BID  . loratadine  10 mg Oral Daily  . metoCLOPramide (REGLAN) injection  10 mg Intravenous Q6H  . metoprolol tartrate  25 mg Oral BID  . pantoprazole  40 mg Oral Daily  . potassium chloride  40 mEq Oral Daily  . pravastatin  40 mg Oral QHS  . sodium chloride flush  3 mL Intravenous Q12H  . sodium chloride flush  3 mL Intravenous Q12H  . tamsulosin  0.4 mg Oral QPC breakfast   Continuous Infusions: . sodium chloride    . sodium chloride Stopped (04/03/18 0631)  . sodium chloride     PRN Meds:.sodium chloride, sodium chloride, menthol-cetylpyridinium, metoprolol tartrate, ondansetron (ZOFRAN) IV, oxyCODONE, sodium chloride flush, sodium chloride flush  Xrays No results found.  Assessment/Plan: S/P Procedure(s) (LRB): CORONARY ARTERY BYPASS GRAFTING (CABG) TIMES FOUR USING LEFT INTERNAL MAMMARY ARTERY AND RIGHT GREATER SAPHENOUS VEIN HARVESTED ENDOSCOPICALLY. (N/A) TRANSESOPHAGEAL ECHOCARDIOGRAM (TEE) (  N/A)   1 conts to progress, awaits final SNP plan 2 hemodyn stable in sinus rhythm 3 sats good on RA 4 creat rising , UOP not recorded, + loose stools- d/c reglan. Some volume overload, cont lasix for now. Weight still above preop, but some low protein/albumin stores. 5 BS control reasonably good, hold on restart metformin till renal fxn trend improves 6 cont to push routine pulm toilet and rehab  LOS: 12 days    Rowe Clack 04/12/2018  I have seen and examined the patient and agree with the assessment and plan as outlined.  Looks ready for d/c to SNF - possibly [facility]  Purcell Nails, MD 04/12/2018 1:37 PM

## 2018-04-12 NOTE — Progress Notes (Signed)
CARDIAC REHAB PHASE I   PRE:  Rate/Rhythm: 81  BP:  Supine: 127/48     SaO2: 94% Room Air  MODE:  Ambulation: 400 ft   POST:  Rate/Rhythem: 84  BP:  Supine: 131/51     SaO2: 95% Room Air  [phone] Patient ambulated in the hallway using rolling walker stopped time 3 to rest. Complained of feeling fatigued. Oxygen saturation 96% on room air upon spot check. Mr [name] declined to sit in the chair complained of his back feeling tired. Reviewed home exercise instructions with the patient. Call bell within reach.   Thayer HeadingsMaria Walden Whitaker RN BSN

## 2018-04-13 LAB — GLUCOSE, CAPILLARY
Glucose-Capillary: 112 mg/dL — ABNORMAL HIGH (ref 70–99)
Glucose-Capillary: 141 mg/dL — ABNORMAL HIGH (ref 70–99)

## 2018-04-13 MED ORDER — MAGIC MOUTHWASH
10.0000 mL | Freq: Three times a day (TID) | ORAL | Status: DC | PRN
Start: 2018-04-13 — End: 2018-04-15
  Filled 2018-04-13: qty 10

## 2018-04-13 NOTE — Progress Notes (Addendum)
[address]       [place] [id number]             [phone]      13 Days Post-Op Procedure(s) (LRB): CORONARY ARTERY BYPASS GRAFTING (CABG) TIMES FOUR USING LEFT INTERNAL MAMMARY ARTERY AND RIGHT GREATER SAPHENOUS VEIN HARVESTED ENDOSCOPICALLY. (N/A) TRANSESOPHAGEAL ECHOCARDIOGRAM (TEE) (N/A) Subjective: Mouth a little uncomfortable from recent dental surgery  Objective: Vital signs in last 24 hours: Temp:  [97.6 F (36.4 C)-98.2 F (36.8 C)] 97.8 F (36.6 C) (12/15 0743) Pulse Rate:  [71-131] 74 (12/15 0813) Cardiac Rhythm: Normal sinus rhythm (12/15 0700) Resp:  [16-19] 17 (12/15 0743) BP: (126-151)/(65-77) 126/65 (12/15 0813) SpO2:  [94 %-100 %] 97 % (12/15 0743) Weight:  [129.6 kg] 129.6 kg (12/15 0421)  Hemodynamic parameters for last 24 hours:    Intake/Output from previous day: 12/14 0701 - 12/15 0700 In: 600 [P.O.:600] Out: -  Intake/Output this shift: No intake/output data recorded.  General appearance: alert, cooperative and no distress Heart: regular rate and rhythm Lungs: clear to auscultation bilaterally Abdomen: benign Extremities: + BLE edema Wound: incis healing well  Lab Results: No results for input(s): WBC, HGB, HCT, PLT in the last 72 hours. BMET:  Recent Labs    04/11/18 0343 04/12/18 0346  NA 133* 134*  K 3.2* 3.8  CL 96* 98  CO2 26 24  GLUCOSE 125* 112*  BUN 15 12  CREATININE 1.29* 1.49*  CALCIUM 8.2* 8.3*    PT/INR: No results for input(s): LABPROT, INR in the last 72 hours. ABG    Component Value Date/Time   PHART 7.316 (L) 04/02/2018 0649   HCO3 21.5 04/02/2018 0649   TCO2 23 04/02/2018 0649   ACIDBASEDEF 4.0 (H) 04/02/2018 0649   O2SAT 56.5 04/07/2018 0340   CBG (last 3)  Recent Labs    04/12/18 1627 04/12/18 2135 04/13/18 0624  GLUCAP 97 143* 112*    Meds Scheduled Meds: . amiodarone  200 mg Oral BID  . aspirin EC  325 mg Oral Daily   Or  . aspirin  324 mg Per Tube Daily  .  bisacodyl  10 mg Oral Daily  . docusate sodium  200 mg Oral Daily  . enoxaparin (LOVENOX) injection  40 mg Subcutaneous Q24H  . furosemide  40 mg Intravenous Daily  . insulin aspart  0-15 Units Subcutaneous TID WC  . insulin aspart  0-5 Units Subcutaneous QHS  . insulin detemir  10 Units Subcutaneous BID  . loratadine  10 mg Oral Daily  . metoprolol tartrate  25 mg Oral BID  . pantoprazole  40 mg Oral Daily  . potassium chloride  40 mEq Oral Daily  . pravastatin  40 mg Oral QHS  . sodium chloride flush  3 mL Intravenous Q12H  . sodium chloride flush  3 mL Intravenous Q12H  . tamsulosin  0.4 mg Oral QPC breakfast   Continuous Infusions: . sodium chloride    . sodium chloride Stopped (04/03/18 0631)  . sodium chloride     PRN Meds:.sodium chloride, sodium chloride, menthol-cetylpyridinium, metoprolol tartrate, ondansetron (ZOFRAN) IV, oxyCODONE, sodium chloride flush, sodium chloride flush  Xrays No results found.  Assessment/Plan: S/P Procedure(s) (LRB): CORONARY ARTERY BYPASS GRAFTING (CABG) TIMES FOUR USING LEFT INTERNAL MAMMARY ARTERY AND RIGHT GREATER SAPHENOUS VEIN HARVESTED ENDOSCOPICALLY. (N/A) TRANSESOPHAGEAL ECHOCARDIOGRAM (TEE) (N/A)  1 stable overall, short  Episode of afib with CVR- mostly sinus- cont amio 2 add magic mouthwash 3 recheck BMET  in am to eval renal fxn, cont lasix, add Ted hose 4 sugar control adeq 5 routine rehab /pulm toilet 6 poss SNF in am  LOS: 13 days    Rowe Clack [facility] 04/13/2018 Pager [phone]  I have seen and examined the patient and agree with the assessment and plan as outlined.  Purcell Nails, MD 04/13/2018 1:17 PM

## 2018-04-13 NOTE — Progress Notes (Addendum)
Pt ambulated in the hall x2 for 470 ft. Room air. Independent with the front wheel walker. Tolerated well. Will continue to monitor.

## 2018-04-14 LAB — GLUCOSE, CAPILLARY
Glucose-Capillary: 96 mg/dL (ref 70–99)
Glucose-Capillary: 130 mg/dL — ABNORMAL HIGH (ref 70–99)
Glucose-Capillary: 129 mg/dL — ABNORMAL HIGH (ref 70–99)
Glucose-Capillary: 115 mg/dL — ABNORMAL HIGH (ref 70–99)
Glucose-Capillary: 145 mg/dL — ABNORMAL HIGH (ref 70–99)
Glucose-Capillary: 104 mg/dL — ABNORMAL HIGH (ref 70–99)
Glucose-Capillary: 131 mg/dL — ABNORMAL HIGH (ref 70–99)

## 2018-04-14 LAB — BASIC METABOLIC PANEL
ANION GAP: 14 (ref 5–15)
BUN: 12 mg/dL (ref 8–23)
CO2: 24 mmol/L (ref 22–32)
Calcium: 8.6 mg/dL — ABNORMAL LOW (ref 8.9–10.3)
Chloride: 98 mmol/L (ref 98–111)
Creatinine, Ser: 1.41 mg/dL — ABNORMAL HIGH (ref 0.61–1.24)
GFR calc non Af Amer: 48 mL/min — ABNORMAL LOW (ref >60)
GFR, EST AFRICAN AMERICAN: 56 mL/min — AB (ref >60)
Glucose, Bld: 101 mg/dL — ABNORMAL HIGH (ref 70–99)
Potassium: 4.1 mmol/L (ref 3.5–5.1)
Sodium: 136 mmol/L (ref 135–145)

## 2018-04-14 MED ORDER — POTASSIUM CHLORIDE CRYS ER 20 MEQ PO TBCR
20.0000 meq | Freq: Every day | ORAL | Status: DC
Start: 2018-04-14 — End: 2018-05-01

## 2018-04-14 MED ORDER — FUROSEMIDE 40 MG PO TABS
40.0000 mg | Freq: Every day | ORAL | Status: DC
Start: 2018-04-14 — End: 2018-05-01

## 2018-04-14 MED ORDER — OXYCODONE HCL 5 MG PO TABS
ORAL | 0 refills | Status: DC
Start: 2018-04-14 — End: 2018-05-15

## 2018-04-14 MED ORDER — METOPROLOL TARTRATE 25 MG PO TABS
25.0000 mg | Freq: Two times a day (BID) | ORAL | Status: DC
Start: 2018-04-14 — End: 2018-05-20

## 2018-04-14 MED ORDER — AMIODARONE HCL 200 MG PO TABS
200.0000 mg | Freq: Every day | ORAL | 1 refills | Status: DC
Start: 2018-04-14 — End: 2018-05-19

## 2018-04-14 NOTE — Discharge Instructions (Signed)
We ask the SNF to please do the following: 1. Please obtain vital signs at least one time daily 2.Please weigh the patient daily. If he or she continues to gain weight or develops lower extremity edema, contact the office at [phone]. 3. Ambulate patient at least three times daily and please use sternal precautions. 4. Please use dry 4x4s and tape and place on right lower leg stab wound. Please change daily and PRN.   Discharge Instructions:  1. You may shower, please wash incisions daily with soap and water and keep dry.  If you wish to cover wounds with dressing you may do so but please keep clean and change daily.  No tub baths or swimming until incisions have completely healed.  If your incisions become red or develop any drainage please call our office at [phone]  2. No Driving until cleared by Dr. Zenaida NieceVan Trigt's office and you are no longer using narcotic pain medications  3. Monitor your weight daily.. Please use the same scale and weigh at same time... If you gain 5-10 lbs in 48 hours with associated lower extremity swelling, please contact our office at [phone]  4. Fever of 101.5 for at least 24 hours with no source, please contact our office at [phone]  5. Activity- up as tolerated, please walk at least 3 times per day.  Avoid strenuous activity, no lifting, pushing, or pulling with your arms over 8-10 lbs for a minimum of 6 weeks  6. If any questions or concerns arise, please do not hesitate to contact our office at [phone]   Coronary Artery Bypass Grafting, Care After These instructions give you information on caring for yourself after your procedure. Your doctor may also give you more specific instructions. Call your doctor if you have any problems or questions after your procedure. Follow these instructions at home:  Only take medicine as told by your doctor. Take medicines exactly as told. Do not stop taking medicines or start any new medicines without  talking to your doctor first.  Take your pulse as told by your doctor.  Do deep breathing as told by your doctor. Use your breathing device (incentive spirometer), if given, to practice deep breathing several times a day. Support your chest with a pillow or your arms when you take deep breaths or cough.  Keep the area clean, dry, and protected where the surgery cuts (incisions) were made. Remove bandages (dressings) only as told by your doctor. If strips were applied to surgical area, do not take them off. They fall off on their own.  Check the surgery area daily for puffiness (swelling), redness, or leaking fluid.  If surgery cuts were made in your legs: ? Avoid crossing your legs. ? Avoid sitting for long periods of time. Change positions every 30 minutes. ? Raise your legs when you are sitting. Place them on pillows.  Wear stockings that help keep blood clots from forming in your legs (compression stockings).  Only take sponge baths until your doctor says it is okay to take showers. Pat the surgery area dry. Do not rub the surgery area with a washcloth or towel. Do not bathe, swim, or use a hot tub until your doctor says it is okay.  Eat foods that are high in fiber. These include raw fruits and vegetables, whole grains, beans, and nuts. Choose lean meats. Avoid canned, processed, and fried foods.  Drink enough fluids to keep your pee (urine) clear or pale yellow.  Weigh yourself every day.  Rest and limit activity as told by your doctor. You may be told to: ? Stop any activity if you have chest pain, shortness of breath, changes in heartbeat, or dizziness. Get help right away if this happens. ? Move around often for short amounts of time or take short walks as told by your doctor. Gradually become more active. You may need help to strengthen your muscles and build endurance. ? Avoid lifting, pushing, or pulling anything heavier than 10 pounds (4.5 kg) for at least 6 weeks after  surgery.  Do not drive until your doctor says it is okay.  Ask your doctor when you can go back to work.  Ask your doctor when you can begin sexual activity again.  Follow up with your doctor as told. Contact a doctor if:  You have puffiness, redness, more pain, or fluid draining from the incision site.  You have a fever.  You have puffiness in your ankles or legs.  You have pain in your legs.  You gain 2 or more pounds (0.9 kg) a day.  You feel sick to your stomach (nauseous) or throw up (vomit).  You have watery poop (diarrhea). Get help right away if:  You have chest pain that goes to your jaw or arms.  You have shortness of breath.  You have a fast or irregular heartbeat.  You notice a "clicking" in your breastbone when you move.  You have numbness or weakness in your arms or legs.  You feel dizzy or light-headed. This information is not intended to replace advice given to you by your health care provider. Make sure you discuss any questions you have with your health care provider. Document Released: 04/21/2013 Document Revised: 09/22/2015 Document Reviewed: 09/23/2012 Elsevier Interactive Patient Education  2017 [employer].

## 2018-04-14 NOTE — Progress Notes (Signed)
CSW met with patient at bedside. CSW explained that she had spoken with the [facility] and that a peer to peer consult was done this morning by a PA. They needed a new PT consult so that the CSW could fax to the insurance company for a determination of placement for a SNF.   CSW verified that the patient would like to go to [facility] in [place].    CSW explained that once the physical therapist completed that she would submit it to the insurance company.   CSW explained that if possibly that [facility] did not approve the patient's that he may be discharged with home health.   Physical Therapy was meeting with patient at bedside as soon as CSW left. CSW will follow up with patient and send off the physical therapy note once PT has signed off.   Domenic Schwab, MSW, [place]

## 2018-04-14 NOTE — Progress Notes (Signed)
Heard back from PA regarding peer to peer, patient will need a new physical therapy note. CSW consulted with RNCM about asking for a new physical therapy consult. Awaiting note to be put in.   Drucilla Schmidtaitlin Berle Fitz, MSW, LCSW-A Clinical Social Worker [facility]

## 2018-04-14 NOTE — Plan of Care (Signed)
  Problem: Education: Goal: Knowledge of General Education information will improve Description Including pain rating scale, medication(s)/side effects and non-pharmacologic comfort measures Outcome: Progressing   Problem: Health Behavior/Discharge Planning: Goal: Ability to manage health-related needs will improve Outcome: Progressing   

## 2018-04-14 NOTE — Progress Notes (Signed)
CSW called the patient's insurance company. Patient will need a peer to peer. CSW called the physician's office. Physician is in surgery today. The on-call PA will be completing the peer to peer. Insurance company will need it by noon.   CSW has updated SNF facility and family. Awaiting a call back from the MD.   Drucilla Schmidtaitlin Larrell Rapozo, MSW, LCSW-A Clinical Social Worker [facility]

## 2018-04-14 NOTE — Progress Notes (Addendum)
      [address]       [place] [id number]             [phone]        14 Days Post-Op Procedure(s) (LRB): CORONARY ARTERY BYPASS GRAFTING (CABG) TIMES FOUR USING LEFT INTERNAL MAMMARY ARTERY AND RIGHT GREATER SAPHENOUS VEIN HARVESTED ENDOSCOPICALLY. (N/A) TRANSESOPHAGEAL ECHOCARDIOGRAM (TEE) (N/A)  Subjective: Patient hopes to get to [facility] today. He has no specific complaints.  Objective: Vital signs in last 24 hours: Temp:  [97.6 F (36.4 C)-98.2 F (36.8 C)] 97.9 F (36.6 C) (12/16 0503) Pulse Rate:  [67-77] 72 (12/16 0503) Cardiac Rhythm: Normal sinus rhythm (12/16 0503) Resp:  [15-21] 17 (12/16 0503) BP: (117-137)/(50-68) 137/60 (12/16 0503) SpO2:  [94 %-97 %] 96 % (12/16 0503) Weight:  [914[128 kg] 128 kg (12/16 0503)  Pre op weight 126 kg Current Weight  04/14/18 128 kg      Intake/Output from previous day: 12/15 0701 - 12/16 0700 In: 600 [P.O.:600] Out: -    Physical Exam:  Cardiovascular: RRR Pulmonary: Slightly diminished at bases Abdomen: Soft, obese, non tender, bowel sounds present. Extremities: Mild bilateral lower extremity edema but continues to decrease Wounds: Sternal wound is clean and dry.  No erythema or signs of infection. Stab wound right lower leg continues to drain sero sanguinous fluid but no sign of infection.  Lab Results: CBC: No results for input(s): WBC, HGB, HCT, PLT in the last 72 hours. BMET:  Recent Labs    04/12/18 0346 04/14/18 0258  NA 134* 136  K 3.8 4.1  CL 98 98  CO2 24 24  GLUCOSE 112* 101*  BUN 12 12  CREATININE 1.49* 1.41*  CALCIUM 8.3* 8.6*    PT/INR:  Lab Results  Component Value Date   INR 1.06 04/08/2018   INR 1.37 03/31/2018   INR 0.96 03/26/2018   ABG:  INR: Will add last result for INR, ABG once components are confirmed Will add last 4 CBG results once components are confirmed  Assessment/Plan: 1. CV-Previous a fib. Previously rapid A paced with return to sinus  rhythm. He continues to maintain SR, second degree heart block. On Amiodarone 200 mg bid and Lopressor 25 mg bid.  2. Pulmonary- On room air. Encourage incentive spirometer and flutter valve 3. Volume overload-on Lasix 40 mg IV daily 4. ABL anemia-H and H yesterday increased to 8.5 and 27.9  Has had previous transfusion. Continue Trinsicon  5. DM-CBGs 130/129/115. On Insulin. Was on Metformin prior to admission and will  restart at discharge. Pre op HGA1C 7.1 6. AKI-Creatinine this am 1.41, slightly decreased from 1.49 7. OSA-does not use CPAP at home like he should 8. Remove chest tube sutures 9. Will need SNF. Patient hopes to go to [facility] today   Lelon HuhDonielle M Eyn[facility] Surgery Center LLCZimmermanPA-C 04/14/2018,7:26 AM [phone]  Patient examined Incisions clean and dry Patient ready for discharge to skilled nursing facility today Follow-up appointments and activity restrictions discussed with patient Lovett SoxPeter Tabby Beaston MD

## 2018-04-14 NOTE — Progress Notes (Signed)
Physical Therapy Treatment Patient Details Name: [name] MRN: [id number] DOB: [birth year] Today's Date: 04/14/2018    History of Present Illness Pt is a 72 y.o. M with significant PMH of CAD, HTN who presents s/p coronary artery bypass graft x 4.    PT Comments    Patient more dyspneic today during session. Requires Min A for bed mobility to maintain sternal precautions. Pt demonstrates instability and unsteadiness (hx of neuropathy bil feet) during gait today without use of RW requiring Min A for support. Reports feeling worse today than normal. Pt required 2 standing rest breaks during ambulation and demonstrated 3/4 DOE; not able to walk and talk today without rest breaks. HR in 70-80s bpm and Sp02 >91% on RA during mobility. Also with 2+ pitting edema LLE. Requires cues to adhere to sternal precautions during all movement. Concerned about pt returning home as he lives alone; recommend SNF so pt can maximize independence and mobility prior to return home. Will follow.   Follow Up Recommendations  SNF     Equipment Recommendations  None recommended by PT    Recommendations for Other Services       Precautions / Restrictions Precautions Precautions: Sternal Precaution Booklet Issued: No Precaution Comments: Reviewed sternal precautions- "move in the tube" Restrictions Weight Bearing Restrictions: Yes Other Position/Activity Restrictions: sternal precautions    Mobility  Bed Mobility Overal bed mobility: Needs Assistance Bed Mobility: Rolling;Sidelying to Sit Rolling: Min guard Sidelying to sit: Min assist       General bed mobility comments: Cues for technique, use of body momentum, increased time and assist to elevate trunk to get to EOB. Cues to adhere to precautions HOB flat, no rails.   Transfers Overall transfer level: Needs assistance Equipment used: Rolling walker (2 wheeled) Transfers: Sit to/from Stand Sit to Stand: Min guard         General  transfer comment: min cues for hand placement for maintenance of sternal precautions, stood from EOB x1, from toilet x1  Ambulation/Gait Ambulation/Gait assistance: Min assist;Min guard Gait Distance (Feet): 120 Feet Assistive device: Rolling walker (2 wheeled);None Gait Pattern/deviations: Step-through pattern;Decreased stride length;Wide base of support Gait velocity: decreased   General Gait Details: Slow, unsteady gait without use of RW- Min A for balance/neuropathy due to instability, pt reaching for rail for support. 2 standing rest breaks, 3/4 DOE.  Sp02 remained >91%, HR 70-80s bpm. Balance better with RW.   Stairs             Wheelchair Mobility    Modified Rankin (Stroke Patients Only)       Balance Overall balance assessment: Needs assistance Sitting-balance support: Feet supported;No upper extremity supported Sitting balance-Leahy Scale: Good     Standing balance support: During functional activity Standing balance-Leahy Scale: Fair Standing balance comment: Able to stand statically without UE support but does better with UE support for balance.                            Cognition Arousal/Alertness: Awake/alert Behavior During Therapy: WFL for tasks assessed/performed Overall Cognitive Status: Within Functional Limits for tasks assessed                                        Exercises      General Comments        Pertinent Vitals/Pain Pain Assessment: No/denies  pain    Home Living                      Prior Function            PT Goals (current goals can now be found in the care plan section) Progress towards PT goals: Progressing toward goals    Frequency    Min 3X/week      PT Plan Current plan remains appropriate    Co-evaluation              AM-PAC PT "6 Clicks" Mobility   Outcome Measure  Help needed turning from your back to your side while in a flat bed without using bedrails?:  None Help needed moving from lying on your back to sitting on the side of a flat bed without using bedrails?: A Little Help needed moving to and from a bed to a chair (including a wheelchair)?: None Help needed standing up from a chair using your arms (e.g., wheelchair or bedside chair)?: None Help needed to walk in hospital room?: None Help needed climbing 3-5 steps with a railing? : A Little 6 Click Score: 22    End of Session   Activity Tolerance: Patient tolerated treatment well Patient left: in bed;with call bell/phone within reach Nurse Communication: Mobility status PT Visit Diagnosis: Difficulty in walking, not elsewhere classified (R26.2)     Time: [id number] PT Time Calculation (min) (ACUTE ONLY): 28 min  Charges:  $Gait Training: 8-22 mins $Therapeutic Activity: 8-22 mins                     [name], PT, DPT Acute Rehabilitation Services Pager [phone] Office [phone]       [name] 04/14/2018, 2:24 PM

## 2018-04-14 NOTE — Care Management Important Message (Signed)
Important Message  Patient Details  Name: Kizzie FantasiaLarry F Lodico MRN: [id number] Date of Birth: 1942/05/26   Medicare Important Message Given:  Yes    Darrold SpanWebster, Shanikqua Zarzycki Hall, RN 04/14/2018, 10:47 AM

## 2018-04-15 LAB — GLUCOSE, CAPILLARY
Glucose-Capillary: 123 mg/dL — ABNORMAL HIGH (ref 70–99)
Glucose-Capillary: 121 mg/dL — ABNORMAL HIGH (ref 70–99)

## 2018-04-15 MED ORDER — AMLODIPINE BESYLATE 5 MG PO TABS
5.0000 mg | Freq: Every day | ORAL | Status: DC
Start: 2018-04-15 — End: 2018-04-15
  Administered 2018-04-15: 5 mg via ORAL
  Filled 2018-04-15: qty 1

## 2018-04-15 NOTE — Progress Notes (Signed)
Occupational Therapy Treatment Patient Details Name: [name] MRN: [id number] DOB: 10-30-1942 Today's Date: 04/15/2018    History of present illness Pt is a 72 y.o. M with significant PMH of CAD, HTN who presents s/p coronary artery bypass graft x 4.   OT comments  Pt making good progress with functional goals. Pt able to verbalize sternal precautions, howeve required min verbal cues to adhere to. Pt able to gather toiletry items at sink in prep for grooming. Pt to d/c to SNF this afternoon  Follow Up Recommendations  SNF    Equipment Recommendations  3 in 1 bedside commode    Recommendations for Other Services      Precautions / Restrictions Precautions Precautions: Sternal Precaution Booklet Issued: No Precaution Comments: Reviewed sternal precautions Restrictions Weight Bearing Restrictions: Yes Other Position/Activity Restrictions: sternal precautions       Mobility Bed Mobility               General bed mobility comments: pt sitting EOB with daughter present upon arrival  Transfers Overall transfer level: Needs assistance Equipment used: Rolling walker (2 wheeled) Transfers: Sit to/from Stand Sit to Stand: Min guard         General transfer comment: min cues for hand placement for maintenance of sternal precautions, stood from EOB x1, from toilet x1    Balance Overall balance assessment: Needs assistance Sitting-balance support: Feet supported;No upper extremity supported Sitting balance-Leahy Scale: Good     Standing balance support: During functional activity;Single extremity supported;Bilateral upper extremity supported Standing balance-Leahy Scale: Fair                             ADL either performed or assessed with clinical judgement   ADL Overall ADL's : Needs assistance/impaired     Grooming: Wash/dry hands;Standing;Supervision/safety;Wash/dry Scientist, research (physical sciences):  Supervision/safety;Ambulation;RW   Toileting- Clothing Manipulation and Hygiene: Supervision/safety;Sit to/from stand   Tub/ Shower Transfer: Min guard;Ambulation;Rolling walker;3 in 1;Grab bars     General ADL Comments: Pt able to verbalize sternal precautions, howeve required min verbal cues to adhere to. Pt able to gather toiletry items at sink in prep for grooming     Vision Baseline Vision/History: Wears glasses Wears Glasses: Reading only Patient Visual Report: No change from baseline     Perception     Praxis      Cognition Arousal/Alertness: Awake/alert Behavior During Therapy: WFL for tasks assessed/performed Overall Cognitive Status: Within Functional Limits for tasks assessed                                          Exercises     Shoulder Instructions       General Comments      Pertinent Vitals/ Pain       Pain Assessment: No/denies pain Pain Intervention(s): Monitored during session  Home Living                                          Prior Functioning/Environment              Frequency  Min 2X/week        Progress Toward  Goals  OT Goals(current goals can now be found in the care plan section)  Progress towards OT goals: Progressing toward goals  Acute Rehab OT Goals Patient Stated Goal: "get back to golf in the spring"  Plan Discharge plan remains appropriate    Co-evaluation                 AM-PAC OT "6 Clicks" Daily Activity     Outcome Measure   Help from another person eating meals?: None Help from another person taking care of personal grooming?: A Little Help from another person toileting, which includes using toliet, bedpan, or urinal?: A Little Help from another person bathing (including washing, rinsing, drying)?: A Little Help from another person to put on and taking off regular upper body clothing?: None Help from another person to put on and taking off regular lower body  clothing?: A Little 6 Click Score: 20    End of Session Equipment Utilized During Treatment: Gait belt;Rolling walker  OT Visit Diagnosis: Muscle weakness (generalized) (M62.81)   Activity Tolerance Patient tolerated treatment well   Patient Left in chair;with call bell/phone within reach;with family/visitor present   Nurse Communication          Time: [id number] OT Time Calculation (min): 16 min  Charges: OT General Charges $OT Visit: 1 Visit OT Treatments $Self Care/Home Management : 8-22 mins     Margaretmary EddySpencer, Adri Schloss Esec LLCJeanette 04/15/2018, 1:03 PM

## 2018-04-15 NOTE — Progress Notes (Addendum)
CS[facility] - confirmed insurance authorization # [id number]. CSW provided information to [facility].  Antony Blackbirdynthia Ashika Apuzzo, [facility] Jefferson HospitalCSWA Clinical Social Worker [phone]

## 2018-04-15 NOTE — Progress Notes (Addendum)
      [address]       [place] [id number]             [phone]        15 Days Post-Op Procedure(s) (LRB): CORONARY ARTERY BYPASS GRAFTING (CABG) TIMES FOUR USING LEFT INTERNAL MAMMARY ARTERY AND RIGHT GREATER SAPHENOUS VEIN HARVESTED ENDOSCOPICALLY. (N/A) TRANSESOPHAGEAL ECHOCARDIOGRAM (TEE) (N/A)  Subjective: Patient without complaints. He hopes insurance has approved SNF  Objective: Vital signs in last 24 hours: Temp:  [98 F (36.7 C)-98.6 F (37 C)] 98.2 F (36.8 C) (12/17 0447) Pulse Rate:  [70-79] 72 (12/17 0449) Cardiac Rhythm: Normal sinus rhythm (12/17 0230) Resp:  [14-22] 22 (12/17 0449) BP: (128-150)/(56-69) 150/65 (12/17 0449) SpO2:  [94 %-100 %] 95 % (12/17 0449) Weight:  [126.2 kg] 126.2 kg (12/17 0449)  Pre op weight 126 kg Current Weight  04/15/18 126.2 kg      Intake/Output from previous day: 12/16 0701 - 12/17 0700 In: 720 [P.O.:720] Out: 1504 [Urine:1500; Stool:4]   Physical Exam:  Cardiovascular: RRR Pulmonary: Slightly diminished at bases Abdomen: Soft, obese, non tender, bowel sounds present. Extremities: Mild bilateral lower extremity edema but continues to decrease Wounds: Sternal wound is clean and dry.  No erythema or signs of infection. Stab wound right lower leg  Now with slight  sero sanguinous drainage but no sign of infection.  Lab Results: CBC: No results for input(s): WBC, HGB, HCT, PLT in the last 72 hours. BMET:  Recent Labs    04/14/18 0258  NA 136  K 4.1  CL 98  CO2 24  GLUCOSE 101*  BUN 12  CREATININE 1.41*  CALCIUM 8.6*    PT/INR:  Lab Results  Component Value Date   INR 1.06 04/08/2018   INR 1.37 03/31/2018   INR 0.96 03/26/2018   ABG:  INR: Will add last result for INR, ABG once components are confirmed Will add last 4 CBG results once components are confirmed  Assessment/Plan: 1. CV-Previous a fib. Previously rapid A paced with return to sinus rhythm. He continues to maintain  SR, second degree heart block. Hypertensive so will restart Amlodipine 5 mg daily.On Amiodarone 200 mg bid and Lopressor 25 mg bid. Will decrease Amiodarone 200 mg daily at discharge. 2. Pulmonary- On room air. Encourage incentive spirometer and flutter valve 3. Volume overload-on Lasix 40 mg IV daily and will change to oral 4. ABL anemia-H and H yesterday increased to 8.5 and 27.9  Has had previous transfusion. Continue Trinsicon  5. DM-CBGs 104/131/123. On Insulin. Was on Metformin prior to admission and will  restart at discharge. Pre op HGA1C 7.1 6. AKI-Creatinine yesterday 1.41, slightly decreased from 1.49 7. OSA-does not use CPAP at home like he should 8. He needs SNF. Awaiting insurance approval.   Lelon HuhDonielle M ZimmermanPA-C 04/15/2018,7:16 AM [phone]

## 2018-04-15 NOTE — Clinical Social Work Placement (Signed)
   CLINICAL SOCIAL WORK PLACEMENT  NOTE  Date:  04/15/2018  Patient Details  Name: [name] MRN: [id number] Date of Birth: [birth year]  Clinical Social Work is seeking post-discharge placement for this patient at the Skilled  Nursing Facility level of care (*CSW will initial, date and re-position this form in  chart as items are completed):  Yes   Patient/family provided with [place] Clinical Social Work Department's list of facilities offering this level of care within the geographic area requested by the patient (or if unable, by the patient's family).  Yes   Patient/family informed of their freedom to choose among providers that offer the needed level of care, that participate in Medicare, Medicaid or managed care program needed by the patient, have an available bed and are willing to accept the patient.  Yes   Patient/family informed of [place]'s ownership interest [facility] and [facility], as well as of the fact that they are under no obligation to receive care at these facilities.  PASRR submitted to EDS on       PASRR number received on 04/11/18     Existing PASRR number confirmed on       FL2 transmitted to all facilities in geographic area requested by pt/family on       FL2 transmitted to all facilities within larger geographic area on       Patient informed that his/her managed care company has contracts with or will negotiate with certain facilities, including the following:        Yes   Patient/family informed of bed offers received.  [facility]     Physician recommends and patient chooses bed at      [facility]  Patient to be transferred to facility by Family member, personal car     Patient family notified on 04/15/18 of transfer.  Name of family member notified:  Corrie DandyMary, daughter      PHYSICIAN       Additional Comment:     _______________________________________________ Eduard Rouxynthia N Quanna Wittke, LCSWA 04/15/2018, 10:25 AM

## 2018-04-15 NOTE — Progress Notes (Signed)
Pt will DC ZO:[id number] DC date:04/15/2018 Family notified: Corrie DandyMary, daughter Transport by: [employer]  RN, patient, and facility notified of DC. Discharge Summary sent to facility. RN given number for report [phone], room 3221. DC packet on chart.  CSW signing off.  Antony Blackbirdynthia Anniston Nellums, [facility] - GainesvilleCSWA Clinical Social Worker [phone]

## 2018-04-15 NOTE — Progress Notes (Signed)
Pt educated and provided discharge instructions. Paper prescription included.  [facility] notified and report given. Telebox removed and ccmd notifiedFamily will transport pt's. IV removed and intact. Pt denies any complaints. Daughter at bedside. Volunteers tx pt via wheelchair to [employer]. Lacy DuverneyJennifer Krrish Freund

## 2018-04-15 NOTE — Care Management Note (Signed)
Case Management Note Donn PieriniKristi Colletta Spillers RN, BSN Transitions of Care Unit 4E- RN Case Manager [phone]  Patient Details  Name: Calvin FantasiaLarry F Formosa MRN: [id number] Date of Birth: [birth year]  Subjective/Objective:   Pt admitted with CAD, s/p CABG                 Action/Plan: PTA pt lived at home, per PT eval SNF recommendation made- cSW following for placement- insurance denied placement- however- P2P done on 04/14/18- and approval attained for placement. CSW following for placement to [facility] per pt choice.   Expected Discharge Date:  04/15/18               Expected Discharge Plan:  Skilled Nursing Facility  In-House Referral:  Clinical Social Work  Discharge planning Services  CM Consult  Post Acute Care Choice:  NA Choice offered to:  NA  DME Arranged:    DME Agency:     HH Arranged:    HH Agency:     Status of Service:  Completed, signed off  If discussed at [employer] Length of Stay Meetings, dates discussed:    Discharge Disposition: skilled facility    Additional Comments:  Darrold SpanWebster, Dillan Candela Hall, RN 04/15/2018, 10:28 AM

## 2018-04-18 ENCOUNTER — Ambulatory Visit: Payer: Medicare HMO | Admitting: Cardiology

## 2018-04-18 ENCOUNTER — Encounter

## 2018-05-01 ENCOUNTER — Ambulatory Visit (INDEPENDENT_AMBULATORY_CARE_PROVIDER_SITE_OTHER): Payer: Medicare HMO | Admitting: Cardiology

## 2018-05-01 ENCOUNTER — Encounter: Payer: Self-pay | Admitting: Cardiology

## 2018-05-01 ENCOUNTER — Encounter

## 2018-05-01 ENCOUNTER — Telehealth: Payer: Self-pay | Admitting: Cardiology

## 2018-05-01 VITALS — BP 132/74 | HR 86 | Ht 71.0 in | Wt 262.4 lb

## 2018-05-01 DIAGNOSIS — D649 Anemia, unspecified: Secondary | ICD-10-CM | POA: Insufficient documentation

## 2018-05-01 DIAGNOSIS — I10 Essential (primary) hypertension: Secondary | ICD-10-CM | POA: Diagnosis not present

## 2018-05-01 DIAGNOSIS — N289 Disorder of kidney and ureter, unspecified: Secondary | ICD-10-CM

## 2018-05-01 DIAGNOSIS — Z951 Presence of aortocoronary bypass graft: Secondary | ICD-10-CM

## 2018-05-01 DIAGNOSIS — D500 Iron deficiency anemia secondary to blood loss (chronic): Secondary | ICD-10-CM | POA: Diagnosis not present

## 2018-05-01 DIAGNOSIS — I4892 Unspecified atrial flutter: Secondary | ICD-10-CM | POA: Insufficient documentation

## 2018-05-01 DIAGNOSIS — E119 Type 2 diabetes mellitus without complications: Secondary | ICD-10-CM | POA: Insufficient documentation

## 2018-05-01 DIAGNOSIS — D62 Acute posthemorrhagic anemia: Secondary | ICD-10-CM

## 2018-05-01 DIAGNOSIS — N189 Chronic kidney disease, unspecified: Secondary | ICD-10-CM

## 2018-05-01 LAB — BASIC METABOLIC PANEL
BUN/Creatinine Ratio: 10 (ref 10–24)
BUN: 13 mg/dL (ref 8–27)
CO2: 22 mmol/L (ref 20–29)
Calcium: 8.8 mg/dL (ref 8.6–10.2)
Chloride: 99 mmol/L (ref 96–106)
Creatinine, Ser: 1.26 mg/dL (ref 0.76–1.27)
GFR calc Af Amer: 64 mL/min/1.73 (ref >59)
GFR calc non Af Amer: 55 mL/min/1.73 — ABNORMAL LOW (ref >59)
Glucose: 146 mg/dL — ABNORMAL HIGH (ref 65–99)
Potassium: 4.4 mmol/L (ref 3.5–5.2)
Sodium: 132 mmol/L — ABNORMAL LOW (ref 134–144)

## 2018-05-01 LAB — CBC
Hematocrit: 27.7 % — ABNORMAL LOW (ref 37.5–51.0)
Hemoglobin: 8.8 g/dL — ABNORMAL LOW (ref 13.0–17.7)
MCH: 26.8 pg (ref 26.6–33.0)
MCHC: 31.8 g/dL (ref 31.5–35.7)
MCV: 85 fL (ref 79–97)
Platelets: 450 x10E3/uL (ref 150–450)
RBC: 3.28 x10E6/uL — ABNORMAL LOW (ref 4.14–5.80)
RDW: 18.9 % — ABNORMAL HIGH (ref 12.3–15.4)
WBC: 10.7 x10E3/uL (ref 3.4–10.8)

## 2018-05-01 LAB — MAGNESIUM: Magnesium: 2 mg/dL (ref 1.6–2.3)

## 2018-05-01 MED ORDER — POTASSIUM CHLORIDE CRYS ER 20 MEQ PO TBCR
20.0000 meq | Freq: Every day | ORAL | 0 refills | Status: DC
Start: 2018-05-01 — End: 2018-05-15

## 2018-05-01 MED ORDER — ASPIRIN EC 81 MG PO TBEC
81.0000 mg | Freq: Every day | ORAL | 3 refills | Status: DC
Start: 2018-05-01 — End: 2019-01-20

## 2018-05-01 MED ORDER — APIXABAN 5 MG PO TABS
5.0000 mg | Freq: Two times a day (BID) | ORAL | 0 refills | Status: DC
Start: 2018-05-01 — End: 2018-06-03

## 2018-05-01 MED ORDER — FUROSEMIDE 40 MG PO TABS
40.0000 mg | Freq: Every day | ORAL | 0 refills | Status: DC
Start: 2018-05-01 — End: 2018-05-15

## 2018-05-01 NOTE — Assessment & Plan Note (Signed)
Controlled- mild LVH, normal LVF, moderate LAE on pre OP echo at [facility]

## 2018-05-01 NOTE — Telephone Encounter (Signed)
I spoke with Corrie Dandy- pt's daughter, and Stark Jock.  Pt's Hgb is stable-8.8.  Plan is to start Eliquis 5 mg BID and decrease ASA to 81 mg daily.  He has a f/u at [facility] Monday and a f/u CBC can be done then.   Corine Shelter PA-C 05/01/2018 4:54 PM

## 2018-05-01 NOTE — Assessment & Plan Note (Signed)
AKI post op- SCr to 2.99, down to 1.4 at discharge

## 2018-05-01 NOTE — Assessment & Plan Note (Signed)
S/P CABG x 4 03/31/18 after abnormal OP [facility]

## 2018-05-01 NOTE — Assessment & Plan Note (Signed)
Post op anemia- check CBC before starting Eliquis. Decrease ASA to 81 mg once Eliquis started

## 2018-05-01 NOTE — Patient Instructions (Signed)
Medication Instructions: START Aspirin 81 mg daily START Lasix 40 mg daily for 5 days START Potassium 20 meq daily for 5 days  START Eliquis 5 mg twice a day ONLY AFTER YOU HEAR BACK FROM THE OFFICE   If you need a refill on your cardiac medications before your next appointment, please call your pharmacy.   Lab work: CBC-STAT  BMP MAGNESIUM  If you have labs (blood work) drawn today and your tests are completely normal, you will receive your results only by: Marland Kitchen MyChart Message (if you have MyChart) OR . A paper copy in the mail If you have any lab test that is abnormal or we need to change your treatment, we will call you to review the results.  Testing/Procedures: NONE  Follow-Up: At [facility], you and your health needs are our priority.  As part of our continuing mission to provide you with exceptional heart care, we have created designated Provider Care Teams.  These Care Teams include your primary Cardiologist (physician) and Advanced Practice Providers (APPs -  Physician Assistants and Nurse Practitioners) who all work together to provide you with the care you need, when you need it. You will need a follow up appointment in 4 weeks.  You may see Nanetta Batty, MD or one of the following Advanced Practice Providers on your designated Care Team:   Corine Shelter, PA-C Judy Pimple, [place] . Marjie Skiff, PA-C  Any Other Special Instructions Will Be Listed Below (If Applicable). DO NOT START ELIQUIS UNTIL YOU HEAR BACK FROM OUR OFFICE. SAMPLES OF ELIQUIS WERE GIVEN

## 2018-05-01 NOTE — Assessment & Plan Note (Signed)
On Glucophage 

## 2018-05-01 NOTE — Assessment & Plan Note (Signed)
Post OP PAF- he was placed on Amiodarone post op and paced out of AF on 12/9. DC'd in NSR 04/14/18 but in atrial flutter on f/u 05/01/18. Plan is for 4 weeks of anticoagulation (depending on CBC today) and OP DCCV in 4 weeks.

## 2018-05-01 NOTE — Progress Notes (Signed)
05/01/2018 [name]   [birth year]  [id number]  Primary Physician Karle PlumberArvind, Moogali M, MD Primary Cardiologist: Dr Hanley Haysosario- Dr Allyson SabalBerry  HPI: [name] is a pleasant 73 year old widower who had been living alone but was active, playing golf several times a week.  He was referred to Dr. Allyson SabalBerry by Dr. Hanley Haysosario for diagnostic catheterization.  The patient had had a abnormal outpatient Myoview for chest pain.  Catheterization revealed significant multivessel calcific coronary disease.  [facility] showed an ejection fraction of 55-60% with mild LVH and moderate left atrial enlargement.  On March 31, 2018 the patient underwent bypass surgery x4 with an LIMA to the LAD, and SVG to the RI, and SVG to the OM, and SVG to the distal circumflex posterior lateral branch.  Postoperatively the patient did have some complications.  He had acute kidney injury with a creatinine peak of 2.99, this improved to 1.4 by discharge on 04/14/2018.  He had transient urinary retention.  He had postoperative anemia requiring transfusion.  He developed postoperative atrial fibrillation and was placed on amiodarone.  On 04/07/2018 he was paced to sinus rhythm.  He was in normal sinus rhythm at discharge.  He was discharged to [facility] for further therapy.    He is in the office today for follow-up.  His daughter accompanied him.  The patient has been somewhat slow to recover.  He is going home today from the nursing home though.  He says he has been up walking but he gets out of breath with minimal activity.  He had significant lower extremity edema on discharge, he says this is better but it still present.  His EKG today in the office shows him to be in atrial flutter with variable ventricular response.  He was unaware he was in an irregular heart rhythm.   Current Outpatient Medications  Medication Sig Dispense Refill  . acetaminophen (TYLENOL) 500 MG tablet Take 1,000  mg by mouth daily as needed for moderate pain.    Marland Kitchen. amiodarone (PACERONE) 200 MG tablet Take 1 tablet (200 mg total) by mouth daily. 30 tablet 1  . amLODipine (NORVASC) 5 MG tablet Take 5 mg by mouth daily.    . Cholecalciferol (VITAMIN D3) 2000 units TABS Take 2,000 Units by mouth 2 (two) times daily.     Marland Kitchen. docusate sodium (COLACE) 100 MG capsule Take 100 mg by mouth 2 (two) times daily.     . finasteride (PROSCAR) 5 MG tablet Take 5 mg by mouth daily.    Marland Kitchen. levalbuterol (XOPENEX) 0.31 MG/3ML nebulizer solution Take 1 ampule by nebulization every 6 (six) hours as needed (COUGH OR DYSPNEA).    Marland Kitchen. loratadine (CLARITIN) 10 MG tablet Take 10 mg by mouth daily.    . metFORMIN (GLUCOPHAGE) 1000 MG tablet Take 1,000 mg by mouth 2 (two) times daily.    . metoprolol tartrate (LOPRESSOR) 25 MG tablet Take 1 tablet (25 mg total) by mouth 2 (two) times daily.    . Multiple Vitamins-Minerals (MULTIVITAMIN ADULT PO) Take 1 tablet by mouth daily.    Marland Kitchen. oxyCODONE (OXY IR/ROXICODONE) 5 MG immediate release tablet Take 5 mg by mouth every 4-6 hours PRN severe pain 30 tablet 0  . pravastatin (PRAVACHOL) 40 MG tablet Take 40 mg by mouth at bedtime.     Marland Kitchen. apixaban (ELIQUIS) 5 MG TABS tablet Take 1 tablet (5 mg total) by mouth 2 (two) times daily. 60 tablet 0  . aspirin EC  81 MG tablet Take 1 tablet (81 mg total) by mouth daily. 90 tablet 3  . furosemide (LASIX) 40 MG tablet Take 1 tablet (40 mg total) by mouth daily for 5 days. 5 tablet 0  . potassium chloride SA (K-DUR,KLOR-CON) 20 MEQ tablet Take 1 tablet (20 mEq total) by mouth daily for 5 days. 5 tablet 0   No current facility-administered medications for this visit.     Allergies  Allergen Reactions  . Ciprofloxacin Rash  . Erythromycin Ethylsuccinate Rash    Past Medical History:  Diagnosis Date  . Allergies   . Anxiety   . CAD (coronary artery disease)   . Chest pain on exertion   . Diabetes (HCC)   . Dyspnea    upon exertion  . Gout   .  Hepatitis    at 73 years old  . Hyperlipemia   . Hypertension     Social History   Socioeconomic History  . Marital status: Widowed    Spouse name: Not on file  . Number of children: 2  . Years of education: Not on file  . Highest education level: Not on file  Occupational History  . Occupation: retired  Engineer, production  . Financial resource strain: Not on file  . Food insecurity:    Worry: Not on file    Inability: Not on file  . Transportation needs:    Medical: Not on file    Non-medical: Not on file  Tobacco Use  . Smoking status: Former Smoker    Types: Cigarettes    Last attempt to quit: 1982    Years since quitting: 38.0  . Smokeless tobacco: Never Used  . Tobacco comment: quit 1982  Substance and Sexual Activity  . Alcohol use: Not Currently  . Drug use: Never  . Sexual activity: Not Currently  Lifestyle  . Physical activity:    Days per week: Not on file    Minutes per session: Not on file  . Stress: Not on file  Relationships  . Social connections:    Talks on phone: Not on file    Gets together: Not on file    Attends religious service: Not on file    Active member of club or organization: Not on file    Attends meetings of clubs or organizations: Not on file    Relationship status: Not on file  . Intimate partner violence:    Fear of current or ex partner: Not on file    Emotionally abused: Not on file    Physically abused: Not on file    Forced sexual activity: Not on file  Other Topics Concern  . 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     Family History  Problem Relation Age of Onset  . Atrial fibrillation Mother   . Stroke Mother   . Cancer Maternal Aunt      Review of Systems: General: negative for chills, fever, night sweats or weight changes.  Cardiovascular: negative for chest pain, dyspnea on exertion, edema, orthopnea, palpitations, paroxysmal nocturnal dyspnea or shortness of  breath Dermatological: negative for rash Respiratory: negative for cough or wheezing Urologic: negative for hematuria Abdominal: negative for nausea, vomiting, diarrhea, bright red blood per rectum, melena, or hematemesis Neurologic: negative for visual changes, syncope, or dizziness All other systems reviewed and are otherwise negative except as noted above.    Blood pressure 132/74, pulse 86, height 5\' 11"  (  1.803 m), weight 262 lb 6.4 oz (119 kg).  General appearance: alert, cooperative, appears stated age, no distress and mildly obese Lungs: clear to auscultation bilaterally Heart: irregularly irregular rhythm Extremities: 2+ bilateral LE edema Skin: pale, cool, dry Neurologic: Grossly normal  EKG Atrial flutter with variable VR- 86.   ASSESSMENT AND PLAN:   Hx of CABG S/P CABG x 4 03/31/18 after abnormal OP Myoview [facility]  Atrial flutter (HCC) Post OP PAF- he was placed on Amiodarone post op and paced out of AF on 12/9. DC'd in NSR 04/14/18 but in atrial flutter on f/u 05/01/18. Plan is for 4 weeks of anticoagulation (depending on CBC today) and OP DCCV in 4 weeks.   Essential hypertension Controlled- mild LVH, normal LVF, moderate LAE on pre OP [facility]  Acute on chronic renal insufficiency AKI post op- SCr to 2.99, down to 1.4 at discharge  Anemia Post op anemia- check CBC before starting Eliquis. Decrease ASA to 81 mg once Eliquis started  Non-insulin dependent type 2 diabetes mellitus (HCC) On Glucophage   PLAN  Check CBC, BMP, Mg++.  If his CBC is stable then decrease ASA to 81 mg and add Eliquis 5 mg BID.  Plan for OP DCCV in 4 weeks if he remains in AF.  Ultimately he will be returned to Dr Rosario's care. Add Lasix 40 mg and K+ 20 x 5 days.   Corine ShelterLuke Tiandre Teall PA-C 05/01/2018 9:12 AM

## 2018-05-02 ENCOUNTER — Other Ambulatory Visit: Payer: Self-pay | Admitting: Cardiothoracic Surgery

## 2018-05-02 DIAGNOSIS — Z951 Presence of aortocoronary bypass graft: Secondary | ICD-10-CM

## 2018-05-03 DIAGNOSIS — Z48812 Encounter for surgical aftercare following surgery on the circulatory system: Secondary | ICD-10-CM | POA: Diagnosis not present

## 2018-05-05 ENCOUNTER — Encounter

## 2018-05-05 ENCOUNTER — Ambulatory Visit
Admission: RE | Admit: 2018-05-05 | Discharge: 2018-05-05 | Disposition: A | Discharge status: Home / Self Care | Payer: Medicare HMO | Source: Ambulatory Visit | Attending: Cardiothoracic Surgery | Admitting: Cardiothoracic Surgery

## 2018-05-05 ENCOUNTER — Ambulatory Visit: Payer: Self-pay

## 2018-05-05 ENCOUNTER — Ambulatory Visit (INDEPENDENT_AMBULATORY_CARE_PROVIDER_SITE_OTHER): Payer: Self-pay | Admitting: Physician Assistant

## 2018-05-05 VITALS — BP 126/75 | HR 76 | Resp 20 | Ht 71.0 in | Wt 260.0 lb

## 2018-05-05 DIAGNOSIS — Z951 Presence of aortocoronary bypass graft: Secondary | ICD-10-CM

## 2018-05-05 DIAGNOSIS — I2510 Atherosclerotic heart disease of native coronary artery without angina pectoris: Secondary | ICD-10-CM

## 2018-05-05 MED ORDER — POTASSIUM CHLORIDE ER 20 MEQ PO TBCR
10.0000 meq | Freq: Every day | ORAL | 3 refills | Status: DC
Start: 2018-05-05 — End: 2018-05-15

## 2018-05-05 MED ORDER — FUROSEMIDE 40 MG PO TABS
40.0000 mg | Freq: Every day | ORAL | 3 refills | Status: DC
Start: 2018-05-05 — End: 2018-05-15

## 2018-05-05 NOTE — Progress Notes (Signed)
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  His hospital stay was complicated by deconditioning requiring SNF at discharge.  He also had Atrial Fibrillation but was able to be paced into NSR. Since hospital discharge the patient reports he is not making as much progress as he would like.  He just recently got discharged home from SNF.  He is short of breath with activity.  He does feel like he is better than he was a week ago.  He continues to work with home PT/OT.  He remains in NSR today and has been started on Eliquis by Dr. Hazle Coca office.   Current Outpatient Medications  Medication Sig Dispense Refill  . acetaminophen (TYLENOL) 500 MG tablet Take 1,000 mg by mouth daily as needed for moderate pain.    Marland Kitchen amiodarone (PACERONE) 200 MG tablet Take 1 tablet (200 mg total) by mouth daily. 30 tablet 1  . amLODipine (NORVASC) 5 MG tablet Take 5 mg by mouth daily.    Marland Kitchen apixaban (ELIQUIS) 5 MG TABS tablet Take 1 tablet (5 mg total) by mouth 2 (two) times daily. 60 tablet 0  . aspirin EC 81 MG tablet Take 1 tablet (81 mg total) by mouth daily. 90 tablet 3  . Cholecalciferol (VITAMIN D3) 2000 units TABS Take 2,000 Units by mouth 2 (two) times daily.     Marland Kitchen docusate sodium (COLACE) 100 MG capsule Take 100 mg by mouth 2 (two) times daily.     . finasteride (PROSCAR) 5 MG tablet Take 5 mg by mouth daily.    Marland Kitchen levalbuterol (XOPENEX) 0.31 MG/3ML nebulizer solution Take 1 ampule by nebulization every 6 (six) hours as needed (COUGH OR DYSPNEA).    Marland Kitchen loratadine (CLARITIN) 10 MG tablet Take 10 mg by mouth daily.    . metFORMIN (GLUCOPHAGE) 1000 MG tablet Take 1,000 mg by mouth 2 (two) times daily.    . metoprolol tartrate (LOPRESSOR) 25 MG tablet Take 1 tablet (25 mg total) by mouth 2 (two) times daily.    . Multiple Vitamins-Minerals (MULTIVITAMIN ADULT PO) Take 1 tablet by mouth daily.    Marland Kitchen oxyCODONE (OXY IR/ROXICODONE) 5 MG immediate release tablet Take 5  mg by mouth every 4-6 hours PRN severe pain 30 tablet 0  . pravastatin (PRAVACHOL) 40 MG tablet Take 40 mg by mouth at bedtime.     . furosemide (LASIX) 40 MG tablet Take 1 tablet (40 mg total) by mouth daily for 5 days. (Patient not taking: Reported on 05/05/2018) 5 tablet 0  . furosemide (LASIX) 40 MG tablet Take 1 tablet (40 mg total) by mouth daily. 30 tablet 3  . potassium chloride 20 MEQ TBCR Take 10 mEq by mouth daily. 30 tablet 3  . potassium chloride SA (K-DUR,KLOR-CON) 20 MEQ tablet Take 1 tablet (20 mEq total) by mouth daily for 5 days. (Patient not taking: Reported on 05/05/2018) 5 tablet 0   No current facility-administered medications for this visit.     Physical Exam:  BP 126/75   Pulse 76   Resp 20   Ht 5\' 11"  (1.803 m)   Wt 260 lb (117.9 kg)   SpO2 95% Comment: RA  BMI 36.26 kg/m   Gen: no apparent distress Heart: RRR Lungs: diminished left base Ext: mild edema bilaterally Incisions: clean and dry  Diagnostic Tests:   CXR: no pneumothorax, small left pleural effusion, which is new  A/P;  1. S/P CABG x 4- he is doing okay.  He remains physically deconditioned which is improving since surgery.  He continues to participate with PT/OT.  Should he continue to improve he can hopefully start cardiac rehab after his visit with Dr. Allyson Sabal in February 2. Post operative Atrial fibrillation- on Eliquis per Dr. Allyson Sabal 3. Left pleural effusion- new and is small, + edema on exam, instructed patient to take lasix 40 mg daily as he will likely need a daily dose, once his swelling improves he may be able to use on prn basis 4. Dispo- patient stable, slowly recovering due to deconditioning, continue PT/OT, will see patient in 4 weeks with repeat CXR at which time he will hopefully be able to start cardiac rehab.   Lowella Dandy, PA-C [facility] [phone]

## 2018-05-05 NOTE — Patient Instructions (Signed)

## 2018-05-06 ENCOUNTER — Ambulatory Visit: Payer: Self-pay

## 2018-05-08 ENCOUNTER — Telehealth: Payer: Self-pay

## 2018-05-08 NOTE — Telephone Encounter (Signed)
Nicholette from [facility] contacted the office requesting verbal orders for Mr. Wassenaar.  He is s/p CABG 03/31/2018 with Dr. Donata Clay.  Patient was in SNF and recently discharge home.  Verbal orders given for 1 time a week for 1 week, 2 times a week for 2 weeks, and one time a week for 2 weeks.  Will await faxed copy for physician signature.

## 2018-05-09 ENCOUNTER — Encounter

## 2018-05-09 ENCOUNTER — Inpatient Hospital Stay (HOSPITAL_COMMUNITY)
Admission: EM | Admit: 2018-05-09 | Discharge: 2018-05-15 | DRG: 444 | Disposition: A | Discharge status: Home Health | Payer: Medicare HMO | Attending: Internal Medicine | Admitting: Internal Medicine

## 2018-05-09 ENCOUNTER — Emergency Department (HOSPITAL_COMMUNITY): Payer: Medicare HMO

## 2018-05-09 ENCOUNTER — Encounter (HOSPITAL_COMMUNITY): Payer: Self-pay | Admitting: Emergency Medicine

## 2018-05-09 DIAGNOSIS — N183 Chronic kidney disease, stage 3 (moderate): Secondary | ICD-10-CM | POA: Diagnosis present

## 2018-05-09 DIAGNOSIS — K5792 Diverticulitis of intestine, part unspecified, without perforation or abscess without bleeding: Secondary | ICD-10-CM | POA: Insufficient documentation

## 2018-05-09 DIAGNOSIS — Z7901 Long term (current) use of anticoagulants: Secondary | ICD-10-CM | POA: Diagnosis not present

## 2018-05-09 DIAGNOSIS — I10 Essential (primary) hypertension: Secondary | ICD-10-CM | POA: Diagnosis not present

## 2018-05-09 DIAGNOSIS — E43 Unspecified severe protein-calorie malnutrition: Secondary | ICD-10-CM | POA: Diagnosis present

## 2018-05-09 DIAGNOSIS — Z978 Presence of other specified devices: Secondary | ICD-10-CM | POA: Diagnosis not present

## 2018-05-09 DIAGNOSIS — D509 Iron deficiency anemia, unspecified: Secondary | ICD-10-CM | POA: Diagnosis not present

## 2018-05-09 DIAGNOSIS — I5032 Chronic diastolic (congestive) heart failure: Secondary | ICD-10-CM | POA: Diagnosis present

## 2018-05-09 DIAGNOSIS — Z934 Other artificial openings of gastrointestinal tract status: Secondary | ICD-10-CM | POA: Diagnosis not present

## 2018-05-09 DIAGNOSIS — Z6834 Body mass index (BMI) 34.0-34.9, adult: Secondary | ICD-10-CM | POA: Diagnosis not present

## 2018-05-09 DIAGNOSIS — Z881 Allergy status to other antibiotic agents status: Secondary | ICD-10-CM | POA: Diagnosis not present

## 2018-05-09 DIAGNOSIS — B961 Klebsiella pneumoniae [K. pneumoniae] as the cause of diseases classified elsewhere: Secondary | ICD-10-CM | POA: Diagnosis not present

## 2018-05-09 DIAGNOSIS — R748 Abnormal levels of other serum enzymes: Secondary | ICD-10-CM | POA: Diagnosis not present

## 2018-05-09 DIAGNOSIS — E785 Hyperlipidemia, unspecified: Secondary | ICD-10-CM | POA: Diagnosis present

## 2018-05-09 DIAGNOSIS — I4892 Unspecified atrial flutter: Secondary | ICD-10-CM | POA: Diagnosis present

## 2018-05-09 DIAGNOSIS — Z7982 Long term (current) use of aspirin: Secondary | ICD-10-CM

## 2018-05-09 DIAGNOSIS — D649 Anemia, unspecified: Secondary | ICD-10-CM | POA: Diagnosis not present

## 2018-05-09 DIAGNOSIS — I130 Hypertensive heart and chronic kidney disease with heart failure and stage 1 through stage 4 chronic kidney disease, or unspecified chronic kidney disease: Secondary | ICD-10-CM | POA: Diagnosis present

## 2018-05-09 DIAGNOSIS — R531 Weakness: Secondary | ICD-10-CM

## 2018-05-09 DIAGNOSIS — K819 Cholecystitis, unspecified: Secondary | ICD-10-CM | POA: Diagnosis not present

## 2018-05-09 DIAGNOSIS — B9620 Unspecified Escherichia coli [E. coli] as the cause of diseases classified elsewhere: Secondary | ICD-10-CM | POA: Diagnosis not present

## 2018-05-09 DIAGNOSIS — Z8719 Personal history of other diseases of the digestive system: Secondary | ICD-10-CM | POA: Diagnosis not present

## 2018-05-09 DIAGNOSIS — Z87891 Personal history of nicotine dependence: Secondary | ICD-10-CM | POA: Diagnosis not present

## 2018-05-09 DIAGNOSIS — Z79899 Other long term (current) drug therapy: Secondary | ICD-10-CM

## 2018-05-09 DIAGNOSIS — E669 Obesity, unspecified: Secondary | ICD-10-CM | POA: Diagnosis present

## 2018-05-09 DIAGNOSIS — I2510 Atherosclerotic heart disease of native coronary artery without angina pectoris: Secondary | ICD-10-CM | POA: Diagnosis present

## 2018-05-09 DIAGNOSIS — Z951 Presence of aortocoronary bypass graft: Secondary | ICD-10-CM | POA: Diagnosis not present

## 2018-05-09 DIAGNOSIS — K82A2 Perforation of gallbladder in cholecystitis: Secondary | ICD-10-CM | POA: Diagnosis present

## 2018-05-09 DIAGNOSIS — K750 Abscess of liver: Secondary | ICD-10-CM | POA: Diagnosis present

## 2018-05-09 DIAGNOSIS — E1122 Type 2 diabetes mellitus with diabetic chronic kidney disease: Secondary | ICD-10-CM | POA: Diagnosis present

## 2018-05-09 DIAGNOSIS — Z7984 Long term (current) use of oral hypoglycemic drugs: Secondary | ICD-10-CM | POA: Diagnosis not present

## 2018-05-09 DIAGNOSIS — E860 Dehydration: Secondary | ICD-10-CM | POA: Diagnosis present

## 2018-05-09 DIAGNOSIS — E871 Hypo-osmolality and hyponatremia: Secondary | ICD-10-CM | POA: Diagnosis present

## 2018-05-09 DIAGNOSIS — E119 Type 2 diabetes mellitus without complications: Secondary | ICD-10-CM | POA: Diagnosis not present

## 2018-05-09 DIAGNOSIS — M109 Gout, unspecified: Secondary | ICD-10-CM | POA: Diagnosis present

## 2018-05-09 DIAGNOSIS — Z95828 Presence of other vascular implants and grafts: Secondary | ICD-10-CM | POA: Diagnosis not present

## 2018-05-09 DIAGNOSIS — N179 Acute kidney failure, unspecified: Secondary | ICD-10-CM | POA: Diagnosis not present

## 2018-05-09 DIAGNOSIS — K82A1 Gangrene of gallbladder in cholecystitis: Secondary | ICD-10-CM | POA: Diagnosis present

## 2018-05-09 DIAGNOSIS — Z8249 Family history of ischemic heart disease and other diseases of the circulatory system: Secondary | ICD-10-CM

## 2018-05-09 DIAGNOSIS — R091 Pleurisy: Secondary | ICD-10-CM | POA: Diagnosis not present

## 2018-05-09 DIAGNOSIS — Z1611 Resistance to penicillins: Secondary | ICD-10-CM | POA: Diagnosis not present

## 2018-05-09 DIAGNOSIS — K810 Acute cholecystitis: Principal | ICD-10-CM | POA: Diagnosis present

## 2018-05-09 LAB — COMPREHENSIVE METABOLIC PANEL
ALT: 86 U/L — AB (ref 0–44)
AST: 82 U/L — ABNORMAL HIGH (ref 15–41)
Albumin: 2.4 g/dL — ABNORMAL LOW (ref 3.5–5.0)
Alkaline Phosphatase: 228 U/L — ABNORMAL HIGH (ref 38–126)
Anion gap: 13 (ref 5–15)
BUN: 20 mg/dL (ref 8–23)
CO2: 22 mmol/L (ref 22–32)
Calcium: 9.2 mg/dL (ref 8.9–10.3)
Chloride: 95 mmol/L — ABNORMAL LOW (ref 98–111)
Creatinine, Ser: 1.46 mg/dL — ABNORMAL HIGH (ref 0.61–1.24)
GFR calc Af Amer: 53 mL/min — ABNORMAL LOW (ref >60)
GFR calc non Af Amer: 46 mL/min — ABNORMAL LOW (ref >60)
GLUCOSE: 149 mg/dL — AB (ref 70–99)
Potassium: 4.3 mmol/L (ref 3.5–5.1)
Sodium: 130 mmol/L — ABNORMAL LOW (ref 135–145)
Total Bilirubin: 0.9 mg/dL (ref 0.3–1.2)
Total Protein: 6.6 g/dL (ref 6.5–8.1)

## 2018-05-09 LAB — CBC
HCT: 30.2 % — ABNORMAL LOW (ref 39.0–52.0)
HCT: 30.8 % — ABNORMAL LOW (ref 39.0–52.0)
Hemoglobin: 9.5 g/dL — ABNORMAL LOW (ref 13.0–17.0)
Hemoglobin: 9.6 g/dL — ABNORMAL LOW (ref 13.0–17.0)
MCH: 27.1 pg (ref 26.0–34.0)
MCH: 26.8 pg (ref 26.0–34.0)
MCHC: 31.5 g/dL (ref 30.0–36.0)
MCHC: 31.2 g/dL (ref 30.0–36.0)
MCV: 86 fL (ref 80.0–100.0)
MCV: 86 fL (ref 80.0–100.0)
NRBC: 0 % (ref 0.0–0.2)
Platelets: 350 K/uL (ref 150–400)
Platelets: 341 K/uL (ref 150–400)
RBC: 3.51 MIL/uL — ABNORMAL LOW (ref 4.22–5.81)
RBC: 3.58 MIL/uL — ABNORMAL LOW (ref 4.22–5.81)
RDW: 17.2 % — ABNORMAL HIGH (ref 11.5–15.5)
RDW: 17.3 % — ABNORMAL HIGH (ref 11.5–15.5)
WBC: 15.7 K/uL — AB (ref 4.0–10.5)
WBC: 10.8 K/uL — AB (ref 4.0–10.5)
nRBC: 0 % (ref 0.0–0.2)

## 2018-05-09 LAB — HEMOGLOBIN A1C
Hgb A1c MFr Bld: 6.5 % — ABNORMAL HIGH (ref 4.8–5.6)
Mean Plasma Glucose: 139.85 mg/dL

## 2018-05-09 LAB — URINALYSIS, ROUTINE W REFLEX MICROSCOPIC
Bilirubin Urine: NEGATIVE
Glucose, UA: NEGATIVE mg/dL
HGB URINE DIPSTICK: NEGATIVE
Ketones, ur: NEGATIVE mg/dL
Leukocytes, UA: NEGATIVE
Nitrite: NEGATIVE
PROTEIN: NEGATIVE mg/dL
SPECIFIC GRAVITY, URINE: 1.014 (ref 1.005–1.030)
pH: 5 (ref 5.0–8.0)

## 2018-05-09 LAB — LIPASE, BLOOD: Lipase: 42 U/L (ref 11–51)

## 2018-05-09 LAB — GLUCOSE, CAPILLARY: Glucose-Capillary: 200 mg/dL — ABNORMAL HIGH (ref 70–99)

## 2018-05-09 LAB — CREATININE, SERUM
CREATININE: 1.37 mg/dL — AB (ref 0.61–1.24)
GFR calc non Af Amer: 50 mL/min — ABNORMAL LOW (ref >60)
GFR, EST AFRICAN AMERICAN: 58 mL/min — AB (ref >60)

## 2018-05-09 LAB — I-STAT TROPONIN, ED: Troponin i, poc: 0 ng/mL (ref 0.00–0.08)

## 2018-05-09 LAB — I-STAT CG4 LACTIC ACID, ED: Lactic Acid, Venous: 1.79 mmol/L (ref 0.5–1.9)

## 2018-05-09 MED ORDER — ACETAMINOPHEN 650 MG RE SUPP
650.0000 mg | Freq: Four times a day (QID) | RECTAL | Status: DC | PRN
Start: 2018-05-09 — End: 2018-05-15

## 2018-05-09 MED ORDER — SODIUM CHLORIDE 0.9 % IV BOLUS
1000.0000 mL | Freq: Once | INTRAVENOUS | Status: AC
Start: 2018-05-09 — End: 2018-05-09
  Administered 2018-05-09: 1000 mL via INTRAVENOUS

## 2018-05-09 MED ORDER — POTASSIUM CHLORIDE IN NACL 20-0.9 MEQ/L-% IV SOLN
INTRAVENOUS | Status: AC
Start: 2018-05-09 — End: 2018-05-10
  Administered 2018-05-10: 50 mL via INTRAVENOUS
  Administered 2018-05-10: 17:00:00 via INTRAVENOUS
  Filled 2018-05-09 (×2): qty 1000
  Filled 2018-05-09: qty 2000

## 2018-05-09 MED ORDER — METRONIDAZOLE IN NACL 5-0.79 MG/ML-% IV SOLN
500.0000 mg | Freq: Once | INTRAVENOUS | Status: DC
Start: 2018-05-09 — End: 2018-05-09

## 2018-05-09 MED ORDER — IOHEXOL 300 MG/ML  SOLN
100.0000 mL | Freq: Once | INTRAMUSCULAR | Status: AC | PRN
Start: 2018-05-09 — End: 2018-05-09
  Administered 2018-05-09: 100 mL via INTRAVENOUS

## 2018-05-09 MED ORDER — INSULIN ASPART 100 UNIT/ML ~~LOC~~ SOLN
0.0000 [IU] | Freq: Three times a day (TID) | SUBCUTANEOUS | Status: DC
Start: 2018-05-10 — End: 2018-05-15
  Administered 2018-05-10: 3 [IU] via SUBCUTANEOUS
  Administered 2018-05-10 – 2018-05-11 (×2): 2 [IU] via SUBCUTANEOUS
  Administered 2018-05-11: 3 [IU] via SUBCUTANEOUS
  Administered 2018-05-12 – 2018-05-13 (×5): 2 [IU] via SUBCUTANEOUS
  Administered 2018-05-13 – 2018-05-14 (×2): 3 [IU] via SUBCUTANEOUS
  Administered 2018-05-14 – 2018-05-15 (×2): 2 [IU] via SUBCUTANEOUS

## 2018-05-09 MED ORDER — SODIUM CHLORIDE 0.9 % IV BOLUS
500.0000 mL | Freq: Once | INTRAVENOUS | Status: AC
Start: 2018-05-09 — End: 2018-05-09
  Administered 2018-05-09: 500 mL via INTRAVENOUS

## 2018-05-09 MED ORDER — CEFEPIME HCL 1 G IJ SOLR
1.0000 g | Freq: Three times a day (TID) | INTRAMUSCULAR | Status: DC
Start: 2018-05-09 — End: 2018-05-15
  Administered 2018-05-10 – 2018-05-15 (×16): 1 g via INTRAVENOUS
  Filled 2018-05-09 (×20): qty 1

## 2018-05-09 MED ORDER — ONDANSETRON HCL 4 MG PO TABS
4.0000 mg | Freq: Four times a day (QID) | ORAL | Status: DC | PRN
Start: 2018-05-09 — End: 2018-05-15

## 2018-05-09 MED ORDER — SODIUM CHLORIDE 0.9% FLUSH
3.0000 mL | Freq: Two times a day (BID) | INTRAVENOUS | Status: DC
Start: 2018-05-09 — End: 2018-05-15
  Administered 2018-05-09 – 2018-05-14 (×3): 3 mL via INTRAVENOUS

## 2018-05-09 MED ORDER — ONDANSETRON HCL 4 MG/2ML IJ SOLN
4.0000 mg | Freq: Four times a day (QID) | INTRAMUSCULAR | Status: DC | PRN
Start: 2018-05-09 — End: 2018-05-15

## 2018-05-09 MED ORDER — OXYCODONE HCL 5 MG PO TABS
10.0000 mg | ORAL | Status: DC | PRN
Start: 2018-05-09 — End: 2018-05-14
  Administered 2018-05-11 – 2018-05-14 (×7): 10 mg via ORAL
  Filled 2018-05-09 (×8): qty 2

## 2018-05-09 MED ORDER — ENOXAPARIN SODIUM 40 MG/0.4ML ~~LOC~~ SOLN
40.0000 mg | SUBCUTANEOUS | Status: DC
Start: 2018-05-09 — End: 2018-05-10
  Filled 2018-05-09: qty 0.4

## 2018-05-09 MED ORDER — ACETAMINOPHEN 325 MG PO TABS
650.0000 mg | Freq: Four times a day (QID) | ORAL | Status: DC | PRN
Start: 2018-05-09 — End: 2018-05-15
  Administered 2018-05-10 – 2018-05-14 (×2): 650 mg via ORAL
  Filled 2018-05-09 (×2): qty 2

## 2018-05-09 MED ORDER — TRAZODONE HCL 50 MG PO TABS
50.0000 mg | Freq: Every evening | ORAL | Status: DC | PRN
Start: 2018-05-09 — End: 2018-05-15
  Administered 2018-05-12: 50 mg via ORAL
  Filled 2018-05-09: qty 1

## 2018-05-09 MED ORDER — POLYETHYLENE GLYCOL 3350 17 G PO PACK
17.0000 g | Freq: Every day | ORAL | Status: DC | PRN
Start: 2018-05-09 — End: 2018-05-15

## 2018-05-09 MED ORDER — INSULIN ASPART 100 UNIT/ML ~~LOC~~ SOLN
0.0000 [IU] | Freq: Every day | SUBCUTANEOUS | Status: DC
Start: 2018-05-09 — End: 2018-05-15

## 2018-05-09 MED ORDER — METRONIDAZOLE IN NACL 5-0.79 MG/ML-% IV SOLN
500.0000 mg | Freq: Once | INTRAVENOUS | Status: AC
Start: 2018-05-09 — End: 2018-05-09
  Administered 2018-05-09: 500 mg via INTRAVENOUS
  Filled 2018-05-09: qty 100

## 2018-05-09 NOTE — ED Provider Notes (Signed)
  Physical Exam  BP 124/87 (BP Location: Right Arm)   Pulse 67   Temp 97.7 F (36.5 C) (Oral)   Resp 19   SpO2 100%   Physical Exam  ED Course/Procedures     Procedures  MDM  Received patient in signout to follow CT scan.  CT scan showed diverticulitis but also showed likely gallbladder/liver abscess.  Discussed with patient and patient around 9 years ago had a liver abscess.  Drained at [facility] with percutaneous drain.  Has not had an issue since then with the gallbladder.  He complains of pain in the left lower abdomen but is tender in the right upper abdomen.  Does have diverticulitis on CT scan and will treat.  Will however require admission the hospital with the large potential abscess.       Benjiman Core, MD 05/09/18 [phone]

## 2018-05-09 NOTE — Consult Note (Signed)
Reason for Consult:abscess Referring Physician: Avyukt Higinbotham is an 73 y.o. male.  HPI: 6 yom s/p cab last month who presents with a couple weeks of some abd pain and decreased appetite. Has some constipation since cabg.  He has prior history from what he tells me of hepatic abscess managed with drainage at [facility] 8-9 years ago.  This resolved. Not sure why it occurred.  He comes in now with no improvement in abd pain and appetite and generally feeling weak. He was doing better after surgery. He has a ct scan that shows large hepatic abscess and possible diverticular disease. I was asked to see him.   Past Medical History:  Diagnosis Date  . Allergies   . Anxiety   . CAD (coronary artery disease)   . Chest pain on exertion   . Diabetes (HCC)   . Dyspnea    upon exertion  . Gout   . Hepatitis    at 72 years old  . Hyperlipemia   . Hypertension     Past Surgical History:  Procedure Laterality Date  . CORONARY ARTERY BYPASS GRAFT N/A 03/31/2018   Procedure: CORONARY ARTERY BYPASS GRAFTING (CABG) TIMES FOUR USING LEFT INTERNAL MAMMARY ARTERY AND RIGHT GREATER SAPHENOUS VEIN HARVESTED ENDOSCOPICALLY.;  Surgeon: Kerin Perna, MD;  Location: [facility] OR;  Service: Open Heart Surgery;  Laterality: N/A;  . LEFT HEART CATH AND CORONARY ANGIOGRAPHY N/A 02/27/2018   Procedure: LEFT HEART CATH AND CORONARY ANGIOGRAPHY;  Surgeon: Runell Gess, MD;  Location: [facility] INVASIVE CV LAB;  Service: Cardiovascular;  Laterality: N/A;  . liver absess at 73 years old    . TEE WITHOUT CARDIOVERSION N/A 03/31/2018   Procedure: TRANSESOPHAGEAL ECHOCARDIOGRAM (TEE);  Surgeon: Donata Clay, Theron Arista, MD;  [facility];  Service: Open Heart Surgery;  Laterality: N/A;    Family History  Problem Relation Age of Onset  . Atrial fibrillation Mother   . Stroke Mother   . Cancer Maternal Aunt     Social History:  reports that he quit smoking about 38 years ago. His smoking use included cigarettes. He has  never used smokeless tobacco. He reports previous alcohol use. He reports that he does not use drugs.  Allergies:  Allergies  Allergen Reactions  . Ciprofloxacin Rash  . Erythromycin Ethylsuccinate Rash    Medications: I have reviewed the patient's current medications.  Results for orders placed or performed during the hospital encounter of 05/09/18 (from the past 48 hour(s))  CBC     Status: Abnormal   Collection Time: 05/09/18 11:08 AM  Result Value Ref Range   WBC 15.7 (H) 4.0 - 10.5 K/uL   RBC 3.51 (L) 4.22 - 5.81 MIL/uL   Hemoglobin 9.5 (L) 13.0 - 17.0 g/dL   HCT 12.7 (L) 51.7 - 00.1 %   MCV 86.0 80.0 - 100.0 fL   MCH 27.1 26.0 - 34.0 pg   MCHC 31.5 30.0 - 36.0 g/dL   RDW 74.9 (H) 44.9 - 67.5 %   Platelets 350 150 - 400 K/uL   nRBC 0.0 0.0 - 0.2 %    Comment: Performed at [facility], [address], [place], [place] [address]  Comprehensive metabolic panel     Status: Abnormal   Collection Time: 05/09/18 11:08 AM  Result Value Ref Range   Sodium 130 (L) 135 - 145 mmol/L   Potassium 4.3 3.5 - 5.1 mmol/L   Chloride 95 (L) 98 - 111 mmol/L   CO2  22 22 - 32 mmol/L   Glucose, Bld 149 (H) 70 - 99 mg/dL   BUN 20 8 - 23 mg/dL   Creatinine, Ser 1.611.46 (H) 0.61 - 1.24 mg/dL   Calcium 9.2 8.9 - 09.610.3 mg/dL   Total Protein 6.6 6.5 - 8.1 g/dL   Albumin 2.4 (L) 3.5 - 5.0 g/dL   AST 82 (H) 15 - 41 U/L   ALT 86 (H) 0 - 44 U/L   Alkaline Phosphatase 228 (H) 38 - 126 U/L   Total Bilirubin 0.9 0.3 - 1.2 mg/dL   GFR calc non Af Amer 46 (L) >60 mL/min   GFR calc Af Amer 53 (L) >60 mL/min   Anion gap 13 5 - 15    Comment: Performed at [facility], [address] [place], [place] [address]  Lipase, blood     Status: None   Collection Time: 05/09/18 11:08 AM  Result Value Ref Range   Lipase 42 11 - 51 U/L    Comment: Performed at [facility], [address] [place], [place] [address]  I-stat troponin, ED     Status: None   Collection Time: 05/09/18 11:17 AM   Result Value Ref Range   Troponin i, poc 0.00 0.00 - 0.08 ng/mL   Comment 3            Comment: Due to the release kinetics of cTnI, a negative result within the first hours of the onset of symptoms does not rule out myocardial infarction with certainty. If myocardial infarction is still suspected, repeat the test at appropriate intervals.   I-Stat CG4 Lactic Acid, ED     Status: None   Collection Time: 05/09/18 11:19 AM  Result Value Ref Range   Lactic Acid, Venous 1.79 0.5 - 1.9 mmol/L  Urinalysis, Routine w reflex microscopic     Status: Abnormal   Collection Time: 05/09/18  1:10 PM  Result Value Ref Range   Color, Urine YELLOW YELLOW   APPearance HAZY (A) CLEAR   Specific Gravity, Urine 1.014 1.005 - 1.030   pH 5.0 5.0 - 8.0   Glucose, UA NEGATIVE NEGATIVE mg/dL   Hgb urine dipstick NEGATIVE NEGATIVE   Bilirubin Urine NEGATIVE NEGATIVE   Ketones, ur NEGATIVE NEGATIVE mg/dL   Protein, ur NEGATIVE NEGATIVE mg/dL   Nitrite NEGATIVE NEGATIVE   Leukocytes, UA NEGATIVE NEGATIVE    Comment: Performed at [facility], [address] [place], [place] [address]    Ct Abdomen Pelvis W Contrast  Result Date: 05/09/2018 CLINICAL DATA:  Abdominal pain, acute, generalized. Decreased appetite with weakness and nausea. Recent fever. EXAM: CT ABDOMEN AND PELVIS WITH CONTRAST TECHNIQUE: Multidetector CT imaging of the abdomen and pelvis was performed using the standard protocol following bolus administration of intravenous contrast. CONTRAST:  100mL OMNIPAQUE IOHEXOL 300 MG/ML  SOLN COMPARISON:  CT abdomen pelvis 10/26/2016. FINDINGS: Lower chest: Lung bases are clear without focal nodule, mass, or airspace disease. Heart is mildly enlarged. Coronary artery calcifications are present. Patient is status post CABG. Hepatobiliary: There is diffuse fatty infiltration liver. New ill-defined multiloculated collection at the gallbladder fossa is concerning for complicated cholecystitis and  hepatic abscess. Although there is no definite solid component. Neoplasm is not excluded. No significant intrahepatic biliary dilation or adenopathy is present. The common bile duct is otherwise normal. There are some inflammatory changes adjacent. Pancreas: Unremarkable. No pancreatic ductal dilatation or surrounding inflammatory changes. Spleen: Normal in size without focal abnormality. 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 The adrenal glands are otherwise normal. A 12 mm cyst is present in the midportion left kidney. An exophytic cyst at the lower pole of the left kidney measures 26 mm. Additional 12 mm parenchymal cyst is present near the lower pole of the left kidney is well. No focal lesions are present in the right kidney. There is no stone or hydronephrosis. Ureters are within normal limits bilaterally. The urinary bladder is normal. Stomach/Bowel: Stomach is within normal limits. Inflammatory changes are present in the second portion the duodenum, adjacent to the gallbladder. Distal duodenum and small bowel are within normal limits. Surgery of the ascending colon is noted. Is some secondary inflammatory change at the hepatic flexure. Transverse colon is within normal limits. The distal descending colon demonstrates diverticular changes. Distal diverticular changes extend into the sigmoid colon or mild inflammatory changes are noted. There is no abscess or free air. Vascular/Lymphatic: Atherosclerotic calcifications are present in the aorta and branch vessels without aneurysm. No significant adenopathy is present. Reproductive: Prostate is unremarkable. Other: No abdominal wall hernia or abnormality. No abdominopelvic ascites. Musculoskeletal: Chronic endplate changes are most evident at L5-4 5 and to a lesser extent L3-4. Vertebral body heights are normal. Pelvis is unremarkable. Hips are located and within normal limits bilaterally. IMPRESSION: 1. Ill-defined  multiloculated collection at the gallbladder fossa measures 12.5 x 10.2 x 7.0 cm. This most likely represents complicated cholecystitis and hepatic abscess. Secondary inflammatory changes are present in the duodenum and colon. Gallbladder neoplasm is considered less likely. 2. Descending and sigmoid colonic diverticular disease with mild inflammatory changes in the sigmoid consistent with early diverticulitis. This likely accounts for the left lower quadrant pain. 3.  Aortic Atherosclerosis (ICD10-I70.0). 4. Multiple left renal cysts. Electronically Signed   By: Marin Roberts M.D.   On: 05/09/2018 16:54   Dg Chest Port 1 View  Result Date: 05/09/2018 CLINICAL DATA:  Cough. EXAM: PORTABLE CHEST 1 VIEW COMPARISON:  05/05/2018 and 04/10/2018 FINDINGS: The heart size and pulmonary vascularity are normal. CABG. Slight scarring in the left midzone and left base. The lungs are otherwise clear. No visible effusions. No discrete bone abnormality. IMPRESSION: No acute cardiopulmonary abnormalities. Electronically Signed   By: Francene Boyers M.D.   On: 05/09/2018 11:44    Review of Systems  Gastrointestinal: Positive for abdominal pain, constipation and nausea.   Blood pressure 124/87, pulse 67, temperature 97.7 F (36.5 C), temperature source Oral, resp. rate 19, SpO2 100 %. Physical Exam  Vitals reviewed. Constitutional: He is oriented to person, place, and time. He appears well-developed and well-nourished.  HENT:  Head: Normocephalic and atraumatic.  Eyes: Pupils are equal, round, and reactive to light. EOM are normal.  Neck: Neck supple.  Cardiovascular: Normal rate and regular rhythm.  Respiratory: Effort normal and breath sounds normal.  Sternotomy incision without infection  GI: Soft. He exhibits no distension. Bowel sounds are decreased. There is abdominal tenderness in the right upper quadrant, right lower quadrant and epigastric area. No hernia.  Lymphadenopathy:    He has no cervical  adenopathy.  Neurological: He is alert and oriented to person, place, and time.  Skin: Skin is warm and dry.    Assessment/Plan: Hepatic abscess ? Cholecystitis ? diveritculitis  Not sure of etiology. I dont think he is really that tender in llq.  Discussed plan with abx, IR drainage and would recommend ID consult. We will follow. I discussed this plan with patient and  his son and daughter.   Emelia LoronMatthew Trumaine Wimer 05/09/2018, 6:14 PM

## 2018-05-09 NOTE — ED Provider Notes (Signed)
[facility]   CSN: [id number] Arrival date & time: 05/09/18  1031     History   Chief Complaint Chief Complaint  Patient presents with  . Anorexia  . Nausea  . Weakness    HPI Calvin FantasiaLarry F Price is a 73 y.o. male.  Patient presents with poor appetite, nausea, generalized weakness, lightheaded when stands for past week. Symptoms gradual onset, constant, persistent, slowly worsening. No focal or unilateral numbness or weakness.  No syncope. Indicates a few days ago had temp sl under 100, otherwise no fevers. Denies chills/sweats. No headache. No neck pain or stiffness. Denies chest pain or discomfort. No sob. +non prod cough. No sore throat or runny nose. No abd pain or vomiting/diarrhea. Indicates constipated with last bm several days ago. Denies skin lesions, rash. No leg pain or swelling. Had cabg surgery 12/2.   The history is provided by the patient.  Weakness  Associated symptoms: cough and nausea   Associated symptoms: no abdominal pain, no chest pain, no diarrhea, no dysuria, no fever, no headaches, no shortness of breath and no vomiting     Past Medical History:  Diagnosis Date  . Allergies   . Anxiety   . CAD (coronary artery disease)   . Chest pain on exertion   . Diabetes (HCC)   . Dyspnea    upon exertion  . Gout   . Hepatitis    at 73 years old  . Hyperlipemia   . Hypertension     Patient Active Problem List   Diagnosis Date Noted  . Atrial flutter (HCC) 05/01/2018  . Acute on chronic renal insufficiency 05/01/2018  . Anemia 05/01/2018  . Non-insulin dependent type 2 diabetes mellitus (HCC) 05/01/2018  . Coronary artery disease 03/31/2018  . Hx of CABG   . Essential hypertension 02/25/2018  . Hyperlipidemia 02/25/2018  . Chest pain 02/25/2018    Past Surgical History:  Procedure Laterality Date  . CORONARY ARTERY BYPASS GRAFT N/A 03/31/2018   Procedure: CORONARY ARTERY BYPASS GRAFTING (CABG) TIMES  FOUR USING LEFT INTERNAL MAMMARY ARTERY AND RIGHT GREATER SAPHENOUS VEIN HARVESTED ENDOSCOPICALLY.;  Surgeon: Kerin PernaVan Trigt, Peter, MD;  [facility];  Service: Open Heart Surgery;  Laterality: N/A;  . LEFT HEART CATH AND CORONARY ANGIOGRAPHY N/A 02/27/2018   Procedure: LEFT HEART CATH AND CORONARY ANGIOGRAPHY;  Surgeon: Runell GessBerry, Jonathan J, MD;  Location: [facility] INVASIVE CV LAB;  Service: Cardiovascular;  Laterality: N/A;  . liver absess at 73 years old    . TEE WITHOUT CARDIOVERSION N/A 03/31/2018   Procedure: TRANSESOPHAGEAL ECHOCARDIOGRAM (TEE);  Surgeon: Donata ClayVan Trigt, Theron AristaPeter, MD;  [facility];  Service: Open Heart Surgery;  Laterality: N/A;        Home Medications    Prior to Admission medications   Medication Sig Start Date End Date Taking? Authorizing Provider  acetaminophen (TYLENOL) 500 MG tablet Take 1,000 mg by mouth daily as needed for moderate pain.    [provider]  amiodarone (PACERONE) 200 MG tablet Take 1 tablet (200 mg total) by mouth daily. 04/14/18   Ardelle BallsZimmerman, Donielle M, PA-C  amLODipine (NORVASC) 5 MG tablet Take 5 mg by mouth daily. 02/05/18   [provider]  apixaban (ELIQUIS) 5 MG TABS tablet Take 1 tablet (5 mg total) by mouth 2 (two) times daily. 05/01/18   Abelino DerrickKilroy, Luke K, PA-C  aspirin EC 81 MG tablet Take 1 tablet (81 mg total) by mouth daily. 05/01/18   Abelino DerrickKilroy, Luke K,  PA-C  Cholecalciferol (VITAMIN D3) 2000 units TABS Take 2,000 Units by mouth 2 (two) times daily.     [provider]  docusate sodium (COLACE) 100 MG capsule Take 100 mg by mouth 2 (two) times daily.     [provider]  finasteride (PROSCAR) 5 MG tablet Take 5 mg by mouth daily. 02/05/18   [provider]  furosemide (LASIX) 40 MG tablet Take 1 tablet (40 mg total) by mouth daily for 5 days. Patient not taking: Reported on 05/05/2018 05/01/18 05/06/18  Abelino Derrick, PA-C  furosemide (LASIX) 40 MG tablet Take 1 tablet (40 mg total) by mouth daily. 05/05/18   Barrett,  Erin R, PA-C  levalbuterol (XOPENEX) 0.31 MG/3ML nebulizer solution Take 1 ampule by nebulization every 6 (six) hours as needed (COUGH OR DYSPNEA).    [provider]  loratadine (CLARITIN) 10 MG tablet Take 10 mg by mouth daily.    [provider]  metFORMIN (GLUCOPHAGE) 1000 MG tablet Take 1,000 mg by mouth 2 (two) times daily. 02/05/18   [provider]  metoprolol tartrate (LOPRESSOR) 25 MG tablet Take 1 tablet (25 mg total) by mouth 2 (two) times daily. 04/14/18   Ardelle Balls, PA-C  Multiple Vitamins-Minerals (MULTIVITAMIN ADULT PO) Take 1 tablet by mouth daily.    [provider]  oxyCODONE (OXY IR/ROXICODONE) 5 MG immediate release tablet Take 5 mg by mouth every 4-6 hours PRN severe pain 04/14/18   Doree Fudge M, PA-C  potassium chloride 20 MEQ TBCR Take 10 mEq by mouth daily. 05/05/18   Barrett, Erin R, PA-C  potassium chloride SA (K-DUR,KLOR-CON) 20 MEQ tablet Take 1 tablet (20 mEq total) by mouth daily for 5 days. Patient not taking: Reported on 05/05/2018 05/01/18 05/06/18  Abelino Derrick, PA-C  pravastatin (PRAVACHOL) 40 MG tablet Take 40 mg by mouth at bedtime.  02/05/18   [provider]    Family History Family History  Problem Relation Age of Onset  . Atrial fibrillation Mother   . Stroke Mother   . Cancer Maternal Aunt     Social History Social History   Tobacco Use  . Smoking status: Former Smoker    Types: Cigarettes    Last attempt to quit: 1982    Years since quitting: 38.0  . Smokeless tobacco: Never Used  . Tobacco comment: quit 1982  Substance Use Topics  . Alcohol use: Not Currently  . Drug use: Never     Allergies   Ciprofloxacin and Erythromycin ethylsuccinate   Review of Systems Review of Systems  Constitutional: Negative for fever.  HENT: Negative for sore throat.   Eyes: Negative for redness and visual disturbance.  Respiratory: Positive for cough. Negative for shortness of breath.     Cardiovascular: Negative for chest pain.  Gastrointestinal: Positive for nausea. Negative for abdominal pain, diarrhea and vomiting.  Endocrine: Negative for polyuria.  Genitourinary: Negative for dysuria and flank pain.  Musculoskeletal: Negative for back pain and neck pain.  Skin: Negative for rash.  Neurological: Positive for weakness and light-headedness. Negative for headaches.  Hematological: Does not bruise/bleed easily.  Psychiatric/Behavioral: Negative for confusion.     Physical Exam Updated Vital Signs BP 129/86 (BP Location: Right Arm)   Pulse 69   Temp 97.7 F (36.5 C) (Oral)   Resp 20   SpO2 98%   Physical Exam Vitals signs and nursing note reviewed.  Constitutional:      Appearance: Normal appearance. He is well-developed.  HENT:  Head: Atraumatic.     Nose: Nose normal.     Mouth/Throat:     Mouth: Mucous membranes are moist.     Pharynx: Oropharynx is clear.  Eyes:     General: No scleral icterus.    Conjunctiva/sclera: Conjunctivae normal.     Pupils: Pupils are equal, round, and reactive to light.  Neck:     Musculoskeletal: Normal range of motion and neck supple. No neck rigidity or muscular tenderness.     Trachea: No tracheal deviation.     Comments: No stiffness or rigidity. No tm.  Cardiovascular:     Rate and Rhythm: Normal rate and regular rhythm.     Pulses: Normal pulses.     Heart sounds: Normal heart sounds. No murmur. No friction rub. No gallop.   Pulmonary:     Effort: Pulmonary effort is normal. No accessory muscle usage or respiratory distress.     Breath sounds: Normal breath sounds.  Abdominal:     General: Bowel sounds are normal. There is no distension.     Palpations: Abdomen is soft. There is no mass.     Tenderness: There is abdominal tenderness. There is no guarding.     Hernia: No hernia is present.     Comments: Mid to left abd tenderness.   Genitourinary:    Comments: No cva tenderness. Musculoskeletal:         General: No swelling or tenderness.  Skin:    General: Skin is warm and dry.     Findings: No rash.  Neurological:     Mental Status: He is alert.     Comments: Alert, speech clear. Motor/sens grossly intact bil.   Psychiatric:        Mood and Affect: Mood normal.      ED Treatments / Results  Labs (all labs ordered are listed, but only abnormal results are displayed) Results for orders placed or performed during the hospital encounter of 05/09/18  CBC  Result Value Ref Range   WBC 15.7 (H) 4.0 - 10.5 K/uL   RBC 3.51 (L) 4.22 - 5.81 MIL/uL   Hemoglobin 9.5 (L) 13.0 - 17.0 g/dL   HCT 86.530.2 (L) 78.439.0 - 69.652.0 %   MCV 86.0 80.0 - 100.0 fL   MCH 27.1 26.0 - 34.0 pg   MCHC 31.5 30.0 - 36.0 g/dL   RDW 29.517.2 (H) 28.411.5 - 13.215.5 %   Platelets 350 150 - 400 K/uL   nRBC 0.0 0.0 - 0.2 %  Comprehensive metabolic panel  Result Value Ref Range   Sodium 130 (L) 135 - 145 mmol/L   Potassium 4.3 3.5 - 5.1 mmol/L   Chloride 95 (L) 98 - 111 mmol/L   CO2 22 22 - 32 mmol/L   Glucose, Bld 149 (H) 70 - 99 mg/dL   BUN 20 8 - 23 mg/dL   Creatinine, Ser 4.401.46 (H) 0.61 - 1.24 mg/dL   Calcium 9.2 8.9 - 10.210.3 mg/dL   Total Protein 6.6 6.5 - 8.1 g/dL   Albumin 2.4 (L) 3.5 - 5.0 g/dL   AST 82 (H) 15 - 41 U/L   ALT 86 (H) 0 - 44 U/L   Alkaline Phosphatase 228 (H) 38 - 126 U/L   Total Bilirubin 0.9 0.3 - 1.2 mg/dL   GFR calc non Af Amer 46 (L) >60 mL/min   GFR calc Af Amer 53 (L) >60 mL/min   Anion gap 13 5 - 15  I-stat troponin, ED  Result Value Ref  Range   Troponin i, poc 0.00 0.00 - 0.08 ng/mL   Comment 3          I-Stat CG4 Lactic Acid, ED  Result Value Ref Range   Lactic Acid, Venous 1.79 0.5 - 1.9 mmol/L   Dg Chest 2 View  Result Date: 05/05/2018 CLINICAL DATA:  Dry cough and shortness of breath.  Recent CABG. EXAM: CHEST - 2 VIEW COMPARISON:  Chest x-ray dated April 10, 2018. FINDINGS: Stable mild cardiomegaly status post CABG. Normal pulmonary vascularity. Unchanged peripheral scarring in the  left mid lung. New trace left pleural effusion. The right lung is clear. No pneumothorax. No acute osseous abnormality. IMPRESSION: 1. New trace left pleural effusion. Electronically Signed   By: Obie Dredge M.D.   On: 05/05/2018 11:05   Dg Chest 2 View  Result Date: 04/10/2018 CLINICAL DATA:  Productive cough EXAM: CHEST - 2 VIEW COMPARISON:  04/09/2018 FINDINGS: Cardiac shadow is stable. Postsurgical changes are again identified. The lungs are well aerated bilaterally with mild scarring in the left mid lung stable from the previous day. No new focal abnormality is noted. IMPRESSION: Stable lingular scarring.  No acute abnormality is noted. Electronically Signed   By: Alcide Clever M.D.   On: 04/10/2018 09:20   Dg Chest Port 1 View  Result Date: 05/09/2018 CLINICAL DATA:  Cough. EXAM: PORTABLE CHEST 1 VIEW COMPARISON:  05/05/2018 and 04/10/2018 FINDINGS: The heart size and pulmonary vascularity are normal. CABG. Slight scarring in the left midzone and left base. The lungs are otherwise clear. No visible effusions. No discrete bone abnormality. IMPRESSION: No acute cardiopulmonary abnormalities. Electronically Signed   By: Francene Boyers M.D.   On: 05/09/2018 11:44    EKG EKG Interpretation  Date/Time:  Friday May 09 2018 10:45:50 EST Ventricular Rate:  65 PR Interval:    QRS Duration: 96 QT Interval:  568 QTC Calculation: 591 R Axis:   14 Text Interpretation:  Atrial flutter Non-specific ST-t changes Confirmed by Cathren Laine ([id number]) on 05/09/2018 11:00:48 AM   Radiology No results found.  Procedures Procedures (including critical care time)  Medications Ordered in ED Medications  sodium chloride 0.9 % bolus 500 mL (has no administration in time range)     Initial Impression / Assessment and Plan / ED Course  I have reviewed the triage vital signs and the nursing notes.  Pertinent labs & imaging results that were available during my care of the patient were reviewed by  me and considered in my medical decision making (see chart for details).  Iv ns bolus. Labs sent.  Reviewed nursing notes and prior charts for additional history.   Labs reviewed - wbc elevated. Given abd tenderness, anorexia, will get ct.   Iv ns bolus.   Labs reviewed - na sl low, cr sl elev from prior - ?volume depletion. lfts mildly elev as well. No ruq pain or tenderness.   Ns bolus.   Ct still pending. Lipase pending.   Signed out to Dr Rubin Payor to check remaining labs and CT, recheck pt, and dispo appropriately.         Final Clinical Impressions(s) / ED Diagnoses   Final diagnoses:  None    ED Discharge Orders    None       Cathren Laine, MD 05/09/18 [phone]

## 2018-05-09 NOTE — ED Notes (Signed)
Clear liquid meal tray ordered for pt 

## 2018-05-09 NOTE — H&P (Signed)
History and Physical    [name]:[id number] DOB: [birth year] DOA: 05/09/2018  PCP: Karle Plumber, MD  Patient coming from: Home  I have personally briefly reviewed patient's old medical records in [facility]  Chief Complaint: Weakness and fatigue feeling poorly  HPI: [name] is a 73 y.o. 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  He has no focal or unilateral numbness or weakness no syncope he indicates that a few days ago he had a temperature slightly under 100 degrees but no versus otherwise.  He denies chills and sweats.  No headache.  No neck pain or stiffness.  No chest pain or discomfort.  No shortness of breath.  Is had a nonproductive cough.  But he has had no sore throat or runny nose.  No abdominal pain or vomiting or diarrhea.  He has been constipated with his last bowel movement being several days ago.  He denies skin lesions or rash.  He has no leg pain or swelling.  In the emergency department he was fully evaluated though he initially complained of no abdominal pain on physical examination was found to have pain in the left lower quadrant in the right upper quadrant with palpation.  CT scan was ordered and he was found to have a 12 cm liver abscess and some diverticulitis in the descending and sigmoid colon with early inflammatory changes.  Given all of the above the patient was referred to me for further evaluation and management.  Dr. Dwain Sarna from surgery has been consulted and is currently seeing the patient.    Review of Systems: As per HPI otherwise all other systems reviewed and  negative.    Past Medical History:  Diagnosis Date  . Allergies    . Anxiety   . CAD (coronary artery disease)   . Chest pain on exertion   . Diabetes (HCC)   . Dyspnea    upon exertion  . Gout   . Hepatitis    at 73 years old  . Hyperlipemia   . Hypertension     Past Surgical History:  Procedure Laterality Date  . CORONARY ARTERY BYPASS GRAFT N/A 03/31/2018   Procedure: CORONARY ARTERY BYPASS GRAFTING (CABG) TIMES FOUR USING LEFT INTERNAL MAMMARY ARTERY AND RIGHT GREATER SAPHENOUS VEIN HARVESTED ENDOSCOPICALLY.;  Surgeon: Kerin Perna, MD;  Location: [facility] OR;  Service: Open Heart Surgery;  Laterality: N/A;  . LEFT HEART CATH AND CORONARY ANGIOGRAPHY N/A 02/27/2018   Procedure: LEFT HEART CATH AND CORONARY ANGIOGRAPHY;  Surgeon: Runell Gess, MD;  Location: [facility] INVASIVE CV LAB;  Service: Cardiovascular;  Laterality: N/A;  . liver absess at 73 years old    . TEE WITHOUT CARDIOVERSION N/A 03/31/2018   Procedure: TRANSESOPHAGEAL ECHOCARDIOGRAM (TEE);  Surgeon: Donata Clay, Theron Arista, MD;  Location: [facility] OR;  Service: Open Heart Surgery;  Laterality: N/A;    Social History   Social History Narrative   Lives alone in [place]/ Retired Insurance underwriter.     reports that he quit smoking about 38 years ago. His smoking use included cigarettes. He has never used smokeless tobacco. He reports previous alcohol use. He reports that he does not use drugs.  Allergies  Allergen Reactions  .  Ciprofloxacin Rash  . Erythromycin Ethylsuccinate Rash    Family History  Problem Relation Age of Onset  . Atrial fibrillation Mother   . Stroke Mother   . Cancer Maternal Aunt      Prior to Admission medications   Medication Sig Start Date End Date Taking? Authorizing Provider  acetaminophen (TYLENOL) 500 MG tablet Take 1,000 mg by mouth daily as needed for moderate pain.    [provider]  amiodarone (PACERONE) 200 MG tablet Take 1 tablet (200 mg total) by mouth daily. 04/14/18   Ardelle BallsZimmerman, Donielle M, PA-C  amLODipine (NORVASC) 5 MG tablet Take 5  mg by mouth daily. 02/05/18   [provider]  apixaban (ELIQUIS) 5 MG TABS tablet Take 1 tablet (5 mg total) by mouth 2 (two) times daily. 05/01/18   Abelino DerrickKilroy, Luke K, PA-C  aspirin EC 81 MG tablet Take 1 tablet (81 mg total) by mouth daily. 05/01/18   Abelino DerrickKilroy, Luke K, PA-C  Cholecalciferol (VITAMIN D3) 2000 units TABS Take 2,000 Units by mouth 2 (two) times daily.     [provider]  docusate sodium (COLACE) 100 MG capsule Take 100 mg by mouth 2 (two) times daily.     [provider]  finasteride (PROSCAR) 5 MG tablet Take 5 mg by mouth daily. 02/05/18   [provider]  furosemide (LASIX) 40 MG tablet Take 1 tablet (40 mg total) by mouth daily for 5 days. Patient not taking: Reported on 05/05/2018 05/01/18 05/06/18  Abelino DerrickKilroy, Luke K, PA-C  furosemide (LASIX) 40 MG tablet Take 1 tablet (40 mg total) by mouth daily. 05/05/18   Barrett, Erin R, PA-C  levalbuterol (XOPENEX) 0.31 MG/3ML nebulizer solution Take 1 ampule by nebulization every 6 (six) hours as needed (COUGH OR DYSPNEA).    [provider]  loratadine (CLARITIN) 10 MG tablet Take 10 mg by mouth daily.    [provider]  metFORMIN (GLUCOPHAGE) 1000 MG tablet Take 1,000 mg by mouth 2 (two) times daily. 02/05/18   [provider]  metoprolol tartrate (LOPRESSOR) 25 MG tablet Take 1 tablet (25 mg total) by mouth 2 (two) times daily. 04/14/18   Ardelle BallsZimmerman, Donielle M, PA-C  Multiple Vitamins-Minerals (MULTIVITAMIN ADULT PO) Take 1 tablet by mouth daily.    [provider]  oxyCODONE (OXY IR/ROXICODONE) 5 MG immediate release tablet Take 5 mg by mouth every 4-6 hours PRN severe pain 04/14/18   Doree FudgeZimmerman, Donielle M, PA-C  potassium chloride 20 MEQ TBCR Take 10 mEq by mouth daily. 05/05/18   Barrett, Erin R, PA-C  potassium chloride SA (K-DUR,KLOR-CON) 20 MEQ tablet Take 1 tablet (20 mEq total) by mouth daily for 5 days. Patient not taking: Reported on 05/05/2018 05/01/18 05/06/18  Abelino DerrickKilroy, Luke K,  PA-C  pravastatin (PRAVACHOL) 40 MG tablet Take 40 mg by mouth at bedtime.  02/05/18   [provider]    Physical Exam:  Constitutional: NAD, calm, comfortable, looks tired and ill Vitals:   05/09/18 1200 05/09/18 1351 05/09/18 1400 05/09/18 1414  BP:    124/87  Pulse: 68 68 68 67  Resp: 18 19 18 19   Temp:      TempSrc:      SpO2: 96% 100% 100% 100%   Eyes: PERRL, lids and conjunctivae normal ENMT: Mucous membranes are moist. Posterior pharynx clear of any exudate or lesions.Normal dentition.  Neck: normal, supple, no masses, no thyromegaly Respiratory: clear to auscultation bilaterally with coarse breath sounds with good air movement, no wheezing, no  crackles. Normal respiratory effort. No accessory muscle use.  Cardiovascular: Regular rate and rhythm, no murmurs / rubs / gallops. No extremity edema. 2+ pedal pulses. No carotid bruits.  Abdomen: Mild left lower quadrant tenderness with palpation and significant right upper quadrant tenderness with palpation, no masses palpated. No hepatosplenomegaly. Bowel sounds positive.  Surgical chest tube wounds healing well Musculoskeletal: no clubbing / cyanosis. No joint deformity upper and lower extremities. Good ROM, no contractures. Normal muscle tone.  Skin: no rashes, lesions, ulcers. No induration, pale thoracic midline scar healing well Neurologic: CN 2-12 grossly intact. Sensation intact, DTR normal. Strength 5/5 in all 4.  Psychiatric: Normal judgment and insight. Alert and oriented x 3. Normal mood.   Labs on Admission: I have personally reviewed following labs and imaging studies  CBC: Recent Labs  Lab 05/09/18 1108  WBC 15.7*  HGB 9.5*  HCT 30.2*  MCV 86.0  PLT 350   Basic Metabolic Panel: Recent Labs  Lab 05/09/18 1108  NA 130*  K 4.3  CL 95*  CO2 22  GLUCOSE 149*  BUN 20  CREATININE 1.46*  CALCIUM 9.2   GFR: Estimated Creatinine Clearance: 56.2 mL/min (A) (by C-G formula based on SCr of 1.46 mg/dL  (H)). Liver Function Tests: Recent Labs  Lab 05/09/18 1108  AST 82*  ALT 86*  ALKPHOS 228*  BILITOT 0.9  PROT 6.6  ALBUMIN 2.4*   Recent Labs  Lab 05/09/18 1108  LIPASE 42   Urine analysis:    Component Value Date/Time   COLORURINE YELLOW 05/09/2018 1310   APPEARANCEUR HAZY (A) 05/09/2018 1310   LABSPEC 1.014 05/09/2018 1310   PHURINE 5.0 05/09/2018 1310   GLUCOSEU NEGATIVE 05/09/2018 1310   HGBUR NEGATIVE 05/09/2018 1310   BILIRUBINUR NEGATIVE 05/09/2018 1310   KETONESUR NEGATIVE 05/09/2018 1310   PROTEINUR NEGATIVE 05/09/2018 1310   NITRITE NEGATIVE 05/09/2018 1310   LEUKOCYTESUR NEGATIVE 05/09/2018 1310    Radiological Exams on Admission: Ct Abdomen Pelvis W Contrast  Result Date: 05/09/2018 CLINICAL DATA:  Abdominal pain, acute, generalized. Decreased appetite with weakness and nausea. Recent fever. EXAM: CT ABDOMEN AND PELVIS WITH CONTRAST TECHNIQUE: Multidetector CT imaging of the abdomen and pelvis was performed using the standard protocol following bolus administration of intravenous contrast. CONTRAST:  100mL OMNIPAQUE IOHEXOL 300 MG/ML  SOLN COMPARISON:  CT abdomen pelvis 10/26/2016. FINDINGS: Lower chest: Lung bases are clear without focal nodule, mass, or airspace disease. Heart is mildly enlarged. Coronary artery calcifications are present. Patient is status post CABG. Hepatobiliary: There is diffuse fatty infiltration liver. New ill-defined multiloculated collection at the gallbladder fossa is concerning for complicated cholecystitis and hepatic abscess. Although there is no definite solid component. Neoplasm is not excluded. No significant intrahepatic biliary dilation or adenopathy is present. The common bile duct is otherwise normal. There are some inflammatory changes adjacent. Pancreas: Unremarkable. No pancreatic ductal dilatation or surrounding inflammatory changes. Spleen: Normal in size without focal abnormality. 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 The adrenal glands are otherwise normal. A 12 mm cyst is present in the midportion left kidney. An exophytic cyst at the lower pole of the left kidney measures 26 mm. Additional 12 mm parenchymal cyst is present near the lower pole of the left kidney is well. No focal lesions are present in the right kidney. There is no stone or hydronephrosis. Ureters are within normal limits bilaterally. The urinary bladder is normal. Stomach/Bowel: Stomach is within normal limits. Inflammatory changes are  present in the second portion the duodenum, adjacent to the gallbladder. Distal duodenum and small bowel are within normal limits. Surgery of the ascending colon is noted. Is some secondary inflammatory change at the hepatic flexure. Transverse colon is within normal limits. The distal descending colon demonstrates diverticular changes. Distal diverticular changes extend into the sigmoid colon or mild inflammatory changes are noted. There is no abscess or free air. Vascular/Lymphatic: Atherosclerotic calcifications are present in the aorta and branch vessels without aneurysm. No significant adenopathy is present. Reproductive: Prostate is unremarkable. Other: No abdominal wall hernia or abnormality. No abdominopelvic ascites. Musculoskeletal: Chronic endplate changes are most evident at L5-4 5 and to a lesser extent L3-4. Vertebral body heights are normal. Pelvis is unremarkable. Hips are located and within normal limits bilaterally. IMPRESSION: 1. Ill-defined multiloculated collection at the gallbladder fossa measures 12.5 x 10.2 x 7.0 cm. This most likely represents complicated cholecystitis and hepatic abscess. Secondary inflammatory changes are present in the duodenum and colon. Gallbladder neoplasm is considered less likely. 2. Descending and sigmoid colonic diverticular disease with mild inflammatory changes in the sigmoid consistent with early diverticulitis. This likely accounts for  the left lower quadrant pain. 3.  Aortic Atherosclerosis (ICD10-I70.0). 4. Multiple left renal cysts. Electronically Signed   By: Marin Roberts M.D.   On: 05/09/2018 16:54   Dg Chest Port 1 View  Result Date: 05/09/2018 CLINICAL DATA:  Cough. EXAM: PORTABLE CHEST 1 VIEW COMPARISON:  05/05/2018 and 04/10/2018 FINDINGS: The heart size and pulmonary vascularity are normal. CABG. Slight scarring in the left midzone and left base. The lungs are otherwise clear. No visible effusions. No discrete bone abnormality. IMPRESSION: No acute cardiopulmonary abnormalities. Electronically Signed   By: Francene Boyers M.D.   On: 05/09/2018 11:44    EKG: Independently reviewed.  Atrial flutter at 65 bpm  Assessment/Plan Principal Problem:   Liver abscess Active Problems:   Acute diverticulitis   Hx of CABG   Atrial flutter (HCC)   Non-insulin dependent type 2 diabetes mellitus (HCC)   Essential hypertension   Hyperlipidemia   Anemia    1.  Liver abscess: Patient will be admitted into the hospital to be started on metronidazole and cefepime for management of diverticulitis and liver abscess.  I will consult interventional radiology for placement of a drain.  She had surgical input. 2.  Acute diverticulitis: Continue antibiotics patient to have clear liquids tonight.  Today is day 1 of metronidazole and cefepime 3.  History of coronary artery bypass graft: Recent surgery on December 2.  Patient has stated that he has not felt as good as his doctors have been telling him he looks.  Clearly the liver abscess may have been contributing to his feeling ill. 4.  Atrial flutter: Continue rate controlling medications as per home med list 6.  Non-insulin-dependent type 2 diabetes mellitus: Sliding scale insulin coverage hold metformin if taking a require additional insulin as sugars have been high due to infection. 7.  Essential hypertension: New home medications monitor blood pressures 8.  Hyperlipidemia:  Liver function tests are slightly elevated but I doubt it is due the patient's statin more than likely it is due to his liver abscess.  We will continue statin and monitor. 10.  Anemia: Likely due to recent surgery and now chronic infection.  We will continue to monitor    DVT prophylaxis: Lovenox Code Status: Full code Family Communication: Spoke with patient's son and daughter Gaylyn Lambert who are present at the bedside Disposition Plan:  Likely home in 4 to 7 days Consults called: Surgery Dr. Dwain Sarna who was present at the bedside Admission status: Inpatient  Lahoma Crocker MD FACP [facility] Pager [phone]  If 7PM-7AM, please contact night-coverage [email or web address] Password [facility]  05/09/2018, 6:25 PM

## 2018-05-09 NOTE — ED Triage Notes (Addendum)
Pt reports he is recovering from bipass surgery. States the last several days he has had a loss of appetite, weakness, and nausea. Pt reports he did have a fever several days ago. Pt has pain to LLQ abdomen. Denies diarrhea. Last Bm was "a long time ago." Denies pain with urination.

## 2018-05-09 NOTE — Progress Notes (Signed)
Patient arrived via hospital bed from ED. Son and daughter at bedside. Patient oriented to room, hospital policies and all needs currently met. Bed alarm on at this time for patient safety

## 2018-05-10 ENCOUNTER — Other Ambulatory Visit: Payer: Self-pay

## 2018-05-10 DIAGNOSIS — E43 Unspecified severe protein-calorie malnutrition: Secondary | ICD-10-CM | POA: Diagnosis present

## 2018-05-10 LAB — COMPREHENSIVE METABOLIC PANEL
ALK PHOS: 186 U/L — AB (ref 38–126)
ALT: 64 U/L — ABNORMAL HIGH (ref 0–44)
AST: 43 U/L — ABNORMAL HIGH (ref 15–41)
Albumin: 2.3 g/dL — ABNORMAL LOW (ref 3.5–5.0)
Anion gap: 14 (ref 5–15)
BUN: 16 mg/dL (ref 8–23)
CALCIUM: 9 mg/dL (ref 8.9–10.3)
CO2: 21 mmol/L — ABNORMAL LOW (ref 22–32)
CREATININE: 1.19 mg/dL (ref 0.61–1.24)
Chloride: 97 mmol/L — ABNORMAL LOW (ref 98–111)
GFR calc Af Amer: >60 mL/min (ref >60)
GFR calc non Af Amer: 59 mL/min — ABNORMAL LOW (ref >60)
Glucose, Bld: 143 mg/dL — ABNORMAL HIGH (ref 70–99)
Potassium: 4 mmol/L (ref 3.5–5.1)
Sodium: 132 mmol/L — ABNORMAL LOW (ref 135–145)
Total Bilirubin: 0.5 mg/dL (ref 0.3–1.2)
Total Protein: 6.3 g/dL — ABNORMAL LOW (ref 6.5–8.1)

## 2018-05-10 LAB — CBC
HCT: 29.4 % — ABNORMAL LOW (ref 39.0–52.0)
Hemoglobin: 9 g/dL — ABNORMAL LOW (ref 13.0–17.0)
MCH: 26 pg (ref 26.0–34.0)
MCHC: 30.6 g/dL (ref 30.0–36.0)
MCV: 85 fL (ref 80.0–100.0)
PLATELETS: 319 K/uL (ref 150–400)
RBC: 3.46 MIL/uL — ABNORMAL LOW (ref 4.22–5.81)
RDW: 17.3 % — ABNORMAL HIGH (ref 11.5–15.5)
WBC: 10.9 K/uL — ABNORMAL HIGH (ref 4.0–10.5)
nRBC: 0 % (ref 0.0–0.2)

## 2018-05-10 LAB — PROTIME-INR
INR: 1.42
Prothrombin Time: 17.2 seconds — ABNORMAL HIGH (ref 11.4–15.2)

## 2018-05-10 LAB — GLUCOSE, CAPILLARY
GLUCOSE-CAPILLARY: 187 mg/dL — AB (ref 70–99)
Glucose-Capillary: 135 mg/dL — ABNORMAL HIGH (ref 70–99)
Glucose-Capillary: 154 mg/dL — ABNORMAL HIGH (ref 70–99)
Glucose-Capillary: 111 mg/dL — ABNORMAL HIGH (ref 70–99)

## 2018-05-10 MED ORDER — BOOST / RESOURCE BREEZE PO LIQD CUSTOM
1.0000 | Freq: Three times a day (TID) | ORAL | Status: DC
Start: 2018-05-10 — End: 2018-05-15
  Administered 2018-05-10 (×2): 1 via ORAL

## 2018-05-10 MED ORDER — MENTHOL 3 MG MT LOZG
1.0000 | OROMUCOSAL | Status: DC | PRN
Start: 2018-05-10 — End: 2018-05-15
  Administered 2018-05-10: 3 mg via ORAL
  Filled 2018-05-10: qty 9

## 2018-05-10 NOTE — Progress Notes (Signed)
Subjective/Chief Complaint: Complains of mild pain RUQ. No fever   Objective: Vital signs in last 24 hours: Temp:  [97.7 F (36.5 C)-97.9 F (36.6 C)] 97.9 F (36.6 C) (01/11 0444) Pulse Rate:  [67-101] 101 (01/11 0444) Resp:  [18-20] 18 (01/11 0444) BP: (124-141)/(55-87) 141/78 (01/11 0444) SpO2:  [96 %-100 %] 99 % (01/11 0444) Weight:  [111 kg-111.8 kg] 111.8 kg (01/11 0203) Last BM Date: 05/09/18  Intake/Output from previous day: 01/10 0701 - 01/11 0700 In: 1876 [I.V.:283.2; IV Piggyback:1592.8] Out: -  Intake/Output this shift: No intake/output data recorded.  General appearance: alert and cooperative Resp: clear to auscultation bilaterally Cardio: regular rate and rhythm GI: soft, minimal tenderness  Lab Results:  Recent Labs    05/09/18 2047 05/10/18 0534  WBC 10.8* 10.9*  HGB 9.6* 9.0*  HCT 30.8* 29.4*  PLT 341 319   BMET Recent Labs    05/09/18 1108 05/09/18 2047 05/10/18 0534  NA 130*  --  132*  K 4.3  --  4.0  CL 95*  --  97*  CO2 22  --  21*  GLUCOSE 149*  --  143*  BUN 20  --  16  CREATININE 1.46* 1.37* 1.19  CALCIUM 9.2  --  9.0   PT/INR No results for input(s): LABPROT, INR in the last 72 hours. ABG No results for input(s): PHART, HCO3 in the last 72 hours.  Invalid input(s): PCO2, PO2  Studies/Results: Ct Abdomen Pelvis W Contrast  Result Date: 05/09/2018 CLINICAL DATA:  Abdominal pain, acute, generalized. Decreased appetite with weakness and nausea. Recent fever. EXAM: CT ABDOMEN AND PELVIS WITH CONTRAST TECHNIQUE: Multidetector CT imaging of the abdomen and pelvis was performed using the standard protocol following bolus administration of intravenous contrast. CONTRAST:  100mL OMNIPAQUE IOHEXOL 300 MG/ML  SOLN COMPARISON:  CT abdomen pelvis 10/26/2016. FINDINGS: Lower chest: Lung bases are clear without focal nodule, mass, or airspace disease. Heart is mildly enlarged. Coronary artery calcifications are present. Patient is status  post CABG. Hepatobiliary: There is diffuse fatty infiltration liver. New ill-defined multiloculated collection at the gallbladder fossa is concerning for complicated cholecystitis and hepatic abscess. Although there is no definite solid component. Neoplasm is not excluded. No significant intrahepatic biliary dilation or adenopathy is present. The common bile duct is otherwise normal. There are some inflammatory changes adjacent. Pancreas: Unremarkable. No pancreatic ductal dilatation or surrounding inflammatory changes. Spleen: Normal in size without focal abnormality. 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 The adrenal glands are otherwise normal. A 12 mm cyst is present in the midportion left kidney. An exophytic cyst at the lower pole of the left kidney measures 26 mm. Additional 12 mm parenchymal cyst is present near the lower pole of the left kidney is well. No focal lesions are present in the right kidney. There is no stone or hydronephrosis. Ureters are within normal limits bilaterally. The urinary bladder is normal. Stomach/Bowel: Stomach is within normal limits. Inflammatory changes are present in the second portion the duodenum, adjacent to the gallbladder. Distal duodenum and small bowel are within normal limits. Surgery of the ascending colon is noted. Is some secondary inflammatory change at the hepatic flexure. Transverse colon is within normal limits. The distal descending colon demonstrates diverticular changes. Distal diverticular changes extend into the sigmoid colon or mild inflammatory changes are noted. There is no abscess or free air. Vascular/Lymphatic: Atherosclerotic calcifications are present in the aorta and branch vessels without aneurysm. No significant adenopathy  is present. Reproductive: Prostate is unremarkable. Other: No abdominal wall hernia or abnormality. No abdominopelvic ascites. Musculoskeletal: Chronic endplate changes are most evident  at L5-4 5 and to a lesser extent L3-4. Vertebral body heights are normal. Pelvis is unremarkable. Hips are located and within normal limits bilaterally. IMPRESSION: 1. Ill-defined multiloculated collection at the gallbladder fossa measures 12.5 x 10.2 x 7.0 cm. This most likely represents complicated cholecystitis and hepatic abscess. Secondary inflammatory changes are present in the duodenum and colon. Gallbladder neoplasm is considered less likely. 2. Descending and sigmoid colonic diverticular disease with mild inflammatory changes in the sigmoid consistent with early diverticulitis. This likely accounts for the left lower quadrant pain. 3.  Aortic Atherosclerosis (ICD10-I70.0). 4. Multiple left renal cysts. Electronically Signed   By: Marin Roberts M.D.   On: 05/09/2018 16:54   Dg Chest Port 1 View  Result Date: 05/09/2018 CLINICAL DATA:  Cough. EXAM: PORTABLE CHEST 1 VIEW COMPARISON:  05/05/2018 and 04/10/2018 FINDINGS: The heart size and pulmonary vascularity are normal. CABG. Slight scarring in the left midzone and left base. The lungs are otherwise clear. No visible effusions. No discrete bone abnormality. IMPRESSION: No acute cardiopulmonary abnormalities. Electronically Signed   By: Francene Boyers M.D.   On: 05/09/2018 11:44    Anti-infectives: Anti-infectives (From admission, onward)   Start     Dose/Rate Route Frequency Ordered Stop   05/09/18 2100  metroNIDAZOLE (FLAGYL) IVPB 500 mg     500 mg 100 mL/hr over 60 Minutes Intravenous  Once 05/09/18 2006 05/09/18 2255   05/09/18 1830  ceFEPIme (MAXIPIME) 1 g in sodium chloride 0.9 % 100 mL IVPB     1 g 200 mL/hr over 30 Minutes Intravenous Every 8 hours 05/09/18 1816     05/09/18 1800  metroNIDAZOLE (FLAGYL) IVPB 500 mg  Status:  Discontinued     500 mg 100 mL/hr over 60 Minutes Intravenous  Once 05/09/18 1758 05/09/18 2006      Assessment/Plan: s/p * No surgery found * Advance diet. Allow clears today and npo after  midnight Can't do perc drain today because of eliquis. Will hold and plan for drain tomorrow Continue abx Will follow  LOS: 1 day    [name] 05/10/2018

## 2018-05-10 NOTE — H&P (Signed)
Chief Complaint: Liver abscess  Referring Physician(s): Lahoma Crocker   Supervising Physician: Malachy Moan  Patient Status: [facility] - In-pt  History of Present Illness: [name] is a 73 y.o. male who presented to the ED yesterday c/o poor appetite, nausea, generalized weakness.  He underwent CABG back in December and has been having physical therapy to "get his heart better" since that time.  He states he felt like he was getting worse instead of better. He was concerned it was his heart again.  Workup in the ED included a CT scan which revealed an ill-defined multiloculated collection at the gallbladder fossa measures 12.5 x 10.2 x 7.0 cm. This most likely represents complicated cholecystitis and hepatic abscess.   We are asked to evaluate patient for percutaneous cholecystostomy and percutaneous drain placement to treat liver abscess.  His other medical issues include diabetes type 2, atrial fibrillation, congestive heart failure, hypercholesterolemia, and history of a liver abscess 9 years ago which was treated with a perc drain at [facility].  He is currently NPO.  He takes Eliquis for Afib which was started on Jan 2. His last dose was yesterday morning and he has not been given Eliquis since he arrived at the hospital.  He denies currently denies fever/chills, N/V. He c/o only mild abdominal discomfort, otherwise ROS negative.  Past Medical History:  Diagnosis Date  . Allergies   . Anxiety   . CAD (coronary artery disease)   . Chest pain on exertion   . Diabetes (HCC)   . Dyspnea    upon exertion  . Gout   . Hepatitis    at 73 years old  . Hyperlipemia   . Hypertension     Past Surgical History:  Procedure Laterality Date  . CORONARY ARTERY BYPASS GRAFT N/A 03/31/2018   Procedure: CORONARY ARTERY BYPASS GRAFTING (CABG) TIMES FOUR USING LEFT INTERNAL MAMMARY ARTERY AND RIGHT GREATER SAPHENOUS VEIN HARVESTED ENDOSCOPICALLY.;  Surgeon: Kerin Perna, MD;  Location: [facility] OR;  Service: Open Heart Surgery;  Laterality: N/A;  . LEFT HEART CATH AND CORONARY ANGIOGRAPHY N/A 02/27/2018   Procedure: LEFT HEART CATH AND CORONARY ANGIOGRAPHY;  Surgeon: Runell Gess, MD;  Location: [facility] INVASIVE CV LAB;  Service: Cardiovascular;  Laterality: N/A;  . liver absess at 73 years old    . TEE WITHOUT CARDIOVERSION N/A 03/31/2018   Procedure: TRANSESOPHAGEAL ECHOCARDIOGRAM (TEE);  Surgeon: Donata Clay, Theron Arista, MD;  Location: [facility] OR;  Service: Open Heart Surgery;  Laterality: N/A;    Allergies: Ciprofloxacin and Erythromycin ethylsuccinate  Medications: Prior to Admission medications   Medication Sig Start Date End Date Taking? Authorizing Provider  acetaminophen (TYLENOL) 500 MG tablet Take 1,000 mg by mouth daily as needed for moderate pain.    [provider]  amiodarone (PACERONE) 200 MG tablet Take 1 tablet (200 mg total) by mouth daily. 04/14/18   Ardelle Balls, PA-C  amLODipine (NORVASC) 5 MG tablet Take 5 mg by mouth daily. 02/05/18   [provider]  apixaban (ELIQUIS) 5 MG TABS tablet Take 1 tablet (5 mg total) by mouth 2 (two) times daily. 05/01/18   Abelino Derrick, PA-C  aspirin EC 81 MG tablet Take 1 tablet (81 mg total) by mouth daily. 05/01/18   Abelino Derrick, PA-C  Cholecalciferol (VITAMIN D3) 2000 units TABS Take 2,000 Units by mouth 2 (two) times daily.     [provider]  docusate sodium (COLACE) 100 MG capsule Take 100  mg by mouth 2 (two) times daily.     [provider]  finasteride (PROSCAR) 5 MG tablet Take 5 mg by mouth daily. 02/05/18   [provider]  furosemide (LASIX) 40 MG tablet Take 1 tablet (40 mg total) by mouth daily for 5 days. Patient not taking: Reported on 05/05/2018 05/01/18 05/06/18  Abelino DerrickKilroy, Luke K, PA-C  furosemide (LASIX) 40 MG tablet Take 1 tablet (40 mg total) by mouth daily. 05/05/18   Barrett, Erin R, PA-C  levalbuterol (XOPENEX) 0.31 MG/3ML nebulizer solution Take 1  ampule by nebulization every 6 (six) hours as needed (COUGH OR DYSPNEA).    [provider]  loratadine (CLARITIN) 10 MG tablet Take 10 mg by mouth daily.    [provider]  metFORMIN (GLUCOPHAGE) 1000 MG tablet Take 1,000 mg by mouth 2 (two) times daily. 02/05/18   [provider]  metoprolol tartrate (LOPRESSOR) 25 MG tablet Take 1 tablet (25 mg total) by mouth 2 (two) times daily. 04/14/18   Ardelle BallsZimmerman, Donielle M, PA-C  Multiple Vitamins-Minerals (MULTIVITAMIN ADULT PO) Take 1 tablet by mouth daily.    [provider]  oxyCODONE (OXY IR/ROXICODONE) 5 MG immediate release tablet Take 5 mg by mouth every 4-6 hours PRN severe pain 04/14/18   Doree FudgeZimmerman, Donielle M, PA-C  potassium chloride 20 MEQ TBCR Take 10 mEq by mouth daily. 05/05/18   Barrett, Erin R, PA-C  potassium chloride SA (K-DUR,KLOR-CON) 20 MEQ tablet Take 1 tablet (20 mEq total) by mouth daily for 5 days. Patient not taking: Reported on 05/05/2018 05/01/18 05/06/18  Abelino DerrickKilroy, Luke K, PA-C  pravastatin (PRAVACHOL) 40 MG tablet Take 40 mg by mouth at bedtime.  02/05/18   [provider]     Family History  Problem Relation Age of Onset  . Atrial fibrillation Mother   . Stroke Mother   . Cancer Maternal Aunt     Social History   Socioeconomic History  . Marital status: Widowed    Spouse name: Not on file  . Number of children: 2  . Years of education: Not on file  . Highest education level: Not on file  Occupational History  . Occupation: retired  Engineer, productionocial Needs  . Financial resource strain: Not on file  . Food insecurity:    Worry: Not on file    Inability: Not on file  . Transportation needs:    Medical: Not on file    Non-medical: Not on file  Tobacco Use  . Smoking status: Former Smoker    Types: Cigarettes    Last attempt to quit: 1982    Years since quitting: 38.0  . Smokeless tobacco: Never Used  . Tobacco comment: quit 1982  Substance and Sexual Activity  . Alcohol use:  Not Currently  . Drug use: Never  . Sexual activity: Not Currently  Lifestyle  . Physical activity:    Days per week: Not on file    Minutes per session: Not on file  . Stress: Not on file  Relationships  . Social connections:    Talks on phone: Not on file    Gets together: Not on file    Attends religious service: Not on file    Active member of club or organization: Not on file    Attends meetings of clubs or organizations: Not on file    Relationship status: Not on file  Other Topics Concern  . Not on file  Social History Narrative   [place]/ Retired Insurance underwritercomputer tech.     Review of Systems: A 12 point ROS discussed and pertinent positives are indicated in the HPI above.  All other systems are negative.  Review of Systems  Vital Signs: BP (!) 141/78 (BP Location: Left Arm)   Pulse (!) 101   Temp 97.9 F (36.6 C) (Oral)   Resp 18   Ht 5\' 11"  (1.803 m)   Wt 111.8 kg   SpO2 99%   BMI 34.38 kg/m   Physical Exam Vitals signs reviewed.  Constitutional:      Appearance: He is obese.  HENT:     Head: Normocephalic and atraumatic.  Eyes:     Extraocular Movements: Extraocular movements intact.  Cardiovascular:     Rate and Rhythm: Normal rate. Rhythm irregular.  Pulmonary:     Effort: Pulmonary effort is normal.     Breath sounds: Normal breath sounds.  Abdominal:     General: There is no distension.     Palpations: Abdomen is soft.     Tenderness: There is no abdominal tenderness. There is no guarding.  Musculoskeletal: Normal range of motion.  Skin:    General: Skin is warm and dry.  Neurological:     General: No focal deficit present.     Mental Status: He is alert and oriented to person, place, and time.  Psychiatric:        Mood and Affect: Mood normal.        Behavior: Behavior normal.        Thought Content: Thought content normal.        Judgment: Judgment normal.     Imaging: Dg Chest 2 View  Result Date: 05/05/2018 CLINICAL  DATA:  Dry cough and shortness of breath.  Recent CABG. EXAM: CHEST - 2 VIEW COMPARISON:  Chest x-ray dated April 10, 2018. FINDINGS: Stable mild cardiomegaly status post CABG. Normal pulmonary vascularity. Unchanged peripheral scarring in the left mid lung. New trace left pleural effusion. The right lung is clear. No pneumothorax. No acute osseous abnormality. IMPRESSION: 1. New trace left pleural effusion. Electronically Signed   By: Obie DredgeWilliam T Derry M.D.   On: 05/05/2018 11:05   Ct Abdomen Pelvis W Contrast  Result Date: 05/09/2018 CLINICAL DATA:  Abdominal pain, acute, generalized. Decreased appetite with weakness and nausea. Recent fever. EXAM: CT ABDOMEN AND PELVIS WITH CONTRAST TECHNIQUE: Multidetector CT imaging of the abdomen and pelvis was performed using the standard protocol following bolus administration of intravenous contrast. CONTRAST:  100mL OMNIPAQUE IOHEXOL 300 MG/ML  SOLN COMPARISON:  CT abdomen pelvis 10/26/2016. FINDINGS: Lower chest: Lung bases are clear without focal nodule, mass, or airspace disease. Heart is mildly enlarged. Coronary artery calcifications are present. Patient is status post CABG. Hepatobiliary: There is diffuse fatty infiltration liver. New ill-defined multiloculated collection at the gallbladder fossa is concerning for complicated cholecystitis and hepatic abscess. Although there is no definite solid component. Neoplasm is not excluded. No significant intrahepatic biliary dilation or adenopathy is present. The common bile duct is otherwise normal. There are some inflammatory changes adjacent. Pancreas: Unremarkable. No pancreatic ductal dilatation or surrounding inflammatory changes. Spleen: Normal in size without focal abnormality. 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 The adrenal glands are otherwise normal. A 12 mm cyst is present in the midportion left kidney. An exophytic cyst at the lower pole of the left kidney  measures 26 mm. Additional 12 mm parenchymal cyst is present near the lower pole of the  left kidney is well. No focal lesions are present in the right kidney. There is no stone or hydronephrosis. Ureters are within normal limits bilaterally. The urinary bladder is normal. Stomach/Bowel: Stomach is within normal limits. Inflammatory changes are present in the second portion the duodenum, adjacent to the gallbladder. Distal duodenum and small bowel are within normal limits. Surgery of the ascending colon is noted. Is some secondary inflammatory change at the hepatic flexure. Transverse colon is within normal limits. The distal descending colon demonstrates diverticular changes. Distal diverticular changes extend into the sigmoid colon or mild inflammatory changes are noted. There is no abscess or free air. Vascular/Lymphatic: Atherosclerotic calcifications are present in the aorta and branch vessels without aneurysm. No significant adenopathy is present. Reproductive: Prostate is unremarkable. Other: No abdominal wall hernia or abnormality. No abdominopelvic ascites. Musculoskeletal: Chronic endplate changes are most evident at L5-4 5 and to a lesser extent L3-4. Vertebral body heights are normal. Pelvis is unremarkable. Hips are located and within normal limits bilaterally. IMPRESSION: 1. Ill-defined multiloculated collection at the gallbladder fossa measures 12.5 x 10.2 x 7.0 cm. This most likely represents complicated cholecystitis and hepatic abscess. Secondary inflammatory changes are present in the duodenum and colon. Gallbladder neoplasm is considered less likely. 2. Descending and sigmoid colonic diverticular disease with mild inflammatory changes in the sigmoid consistent with early diverticulitis. This likely accounts for the left lower quadrant pain. 3.  Aortic Atherosclerosis (ICD10-I70.0). 4. Multiple left renal cysts. Electronically Signed   By: Marin Roberts M.D.   On: 05/09/2018 16:54   Dg  Chest Port 1 View  Result Date: 05/09/2018 CLINICAL DATA:  Cough. EXAM: PORTABLE CHEST 1 VIEW COMPARISON:  05/05/2018 and 04/10/2018 FINDINGS: The heart size and pulmonary vascularity are normal. CABG. Slight scarring in the left midzone and left base. The lungs are otherwise clear. No visible effusions. No discrete bone abnormality. IMPRESSION: No acute cardiopulmonary abnormalities. Electronically Signed   By: Francene Boyers M.D.   On: 05/09/2018 11:44    Labs:  CBC: Recent Labs    05/01/18 0932 05/09/18 1108 05/09/18 2047 05/10/18 0534  WBC 10.7 15.7* 10.8* 10.9*  HGB 8.8* 9.5* 9.6* 9.0*  HCT 27.7* 30.2* 30.8* 29.4*  PLT 450 350 341 319    COAGS: Recent Labs    03/14/18 1610 03/26/18 1146 03/31/18 1458 04/08/18 0238  INR 0.96 0.96 1.37 1.06  APTT 32 31 29  --     BMP: Recent Labs    04/14/18 0258 05/01/18 0932 05/09/18 1108 05/09/18 2047 05/10/18 0534  NA 136 132* 130*  --  132*  K 4.1 4.4 4.3  --  4.0  CL 98 99 95*  --  97*  CO2 24 22 22   --  21*  GLUCOSE 101* 146* 149*  --  143*  BUN 12 13 20   --  16  CALCIUM 8.6* 8.8 9.2  --  9.0  CREATININE 1.41* 1.26 1.46* 1.37* 1.19  GFRNONAA 48* 55* 46* 50* 59*  GFRAA 56* 64 53* 58* >60    LIVER FUNCTION TESTS: Recent Labs    04/06/18 0322 04/07/18 0332 05/09/18 1108 05/10/18 0534  BILITOT 0.7 1.3* 0.9 0.5  AST 21 30 82* 43*  ALT 24 30 86* 64*  ALKPHOS 38 37* 228* 186*  PROT 5.2* 5.1* 6.6 6.3*  ALBUMIN 2.5* 2.5* 2.4* 2.3*    TUMOR MARKERS: No results for input(s): AFPTM, CEA, CA199, CHROMGRNA in the last 8760 hours.  Assessment and Plan:  Acute gangrenous cholecystitis with  liver abscess.  Last dose of Eliquis was yesterday morning.  Patient is currently hemodynamically stable and afebrile. He is on IV antibiotics.  Given increased risk of bleeding, will hold off until tomorrow for drain placement.  I have discontinued Lovenox order.  I spoke with Dr. Barrie Folk and made her aware of the plan. She  will evaluate the patient and if he is at high risk for stroke given Afib, she may consider IV heparin drip to bridge Eliquis.  If patient is started on Heparin drip, I have entered a Care Order to stop the Heparin at 6 am for procedure at 9 am tomorrow.  If patient develops high fever, rigors, hypotension and his overall conditions worsens, please call Dr. Archer Asa (IR MD on call) and he will go ahead and proceed with placement of drains emergently.  Risks and benefits discussed with the patient including bleeding, infection, damage to adjacent structures, bowel perforation/fistula connection, and sepsis.  All of the patient's questions were answered, patient is agreeable to proceed. Consent signed and in chart.  Thank you for this interesting consult.  I greatly enjoyed meeting BURLEIGH PLASS and look forward to participating in their care.  A copy of this report was sent to the requesting provider on this date.  Electronically Signed: Gwynneth Macleod, PA-C   05/10/2018, 9:21 AM      I spent a total of 55 Miinutes  in face to face in clinical consultation, greater than 50% of which was counseling/coordinating care for perc chole and liver abscess drain.

## 2018-05-10 NOTE — Progress Notes (Addendum)
PROGRESS NOTE  [name]:[id number] DOB: Sep 12, 1942 DOA: 05/09/2018 PCP: Karle Plumber, MD  HPI/Recap of past 24 hours: Patient was admitted May 09, 2018.  For right upper quadrant abdominal pain he is a 73 year old with sleep past medical history is significant for recent coronary artery bypass grafts March 31, 2018, type non-insulin-dependent atrial fibrillation congestive heart failure, history of liver abscess 9 years ago.  Patient is found to have liver and gallbladder abscess and will undergo interventional radiology for drainage tomorrow due to him being on Eliquis they are waiting for washout before they will undergo the procedure, hopefully tomorrow morning. Subjective: Patient stated he feels much better but has pain in the right lower quadrant which he rates at 3/10  Assessment/Plan: Principal Problem:   Liver abscess Active Problems:   Essential hypertension   Hyperlipidemia   Hx of CABG   Atrial flutter (HCC)   Anemia   Non-insulin dependent type 2 diabetes mellitus (HCC)   Acute diverticulitis   1.  Liver abscess and gallbladder abscess patient is n.p.o. for percutaneous drainage in the morning by interventional radiologist unless his condition changes.  Continue IV cefepime  2.  Coronary artery disease status post recent CABG December 2019 stable we will continue his current management  3.  Atrial fibrillation new onset on Eliquis.  Eliquis is currently on hold for possible cutaneous abscess drainage tomorrow tomorrow.Eliquis is on hold.  If needed he can be started on IV heparin.  Interventional radiology but they would rather he is not on any anticoagulation until after the procedure  4.  Type 2 diabetes mellitus continue sliding scale insulin  5.  Acute on chronic kidney disease his creatinine was 1.37 on admission is improved to 1.1 today continue IV hydration  6.  Leukocytosis resolving  7.  Mild hyponatremia sodium is 132 today we will  continue to monitor he is also on IV fluids  Code Status: Full  Severity of Illness: The appropriate patient status for this patient is INPATIENT. Inpatient status is judged to be reasonable and necessary in order to provide the required intensity of service to ensure the patient's safety. The patient's presenting symptoms, physical exam findings, and initial radiographic and laboratory data in the context of their chronic comorbidities is felt to place them at high risk for further clinical deterioration. Furthermore, it is not anticipated that the patient will be medically stable for discharge from the hospital within 2 midnights of admission. The following factors support the patient status of inpatient.   " The patient's presenting symptoms include abdominal pain. " The worrisome physical exam findings include gallbladder and liver abscess. " The initial radiographic and laboratory data are worrisome because of gallbladder and liver abscess.  Anemia leukocytosis " The chronic co-morbidities include anemia leukocytosis.   * I certify that at the point of admission it is my clinical judgment that the patient will require inpatient hospital care spanning beyond 2 midnights from the point of admission due to high intensity of service, high risk for further deterioration and high frequency of surveillance required.*    Family Communication: None at bedside  Disposition Plan: Home after procedure tomorrow when stable   Consultants:  Interventional radiology  Procedures:  None  Antimicrobials:  IV antibiotics: Cefepime  DVT prophylaxis: Full code   Objective: Vitals:   05/09/18 1414 05/09/18 2036 05/10/18 0203 05/10/18 0444  BP: 124/87 (!) 124/55  (!) 141/78  Pulse: 67 79  (!) 101  Resp: 19 18  18  Temp:  97.7 F (36.5 C)  97.9 F (36.6 C)  TempSrc:  Oral  Oral  SpO2: 100% 97%  99%  Weight:  111 kg 111.8 kg   Height:  5\' 11"  (1.803 m)      Intake/Output Summary (Last  24 hours) at 05/10/2018 1026 Last data filed at 05/10/2018 0762 Gross per 24 hour  Intake 1876 ml  Output -  Net 1876 ml   Filed Weights   05/09/18 2036 05/10/18 0203  Weight: 111 kg 111.8 kg   Body mass index is 34.38 kg/m.  Exam:  . General: 73 y.o. year-old male well developed well nourished in no acute distress.  Alert and oriented x3.  Slightly obese male . Cardiovascular: Regular rate and rhythm with no rubs or gallops.  No thyromegaly or JVD noted.   Marland Kitchen Respiratory: Clear to auscultation with no wheezes or rales. Good inspiratory effort. . Abdomen: Soft slight tenderness in the right lower quadrant tender nondistended with normal bowel sounds x4 quadrants. . Musculoskeletal: No lower extremity edema. 2/4 pulses in all 4 extremities. . Skin: No ulcerative lesions noted or rashes, . Psychiatry: Mood is appropriate for condition and setting    Data Reviewed: CBC: Recent Labs  Lab 05/09/18 1108 05/09/18 2047 05/10/18 0534  WBC 15.7* 10.8* 10.9*  HGB 9.5* 9.6* 9.0*  HCT 30.2* 30.8* 29.4*  MCV 86.0 86.0 85.0  PLT 350 341 319   Basic Metabolic Panel: Recent Labs  Lab 05/09/18 1108 05/09/18 2047 05/10/18 0534  NA 130*  --  132*  K 4.3  --  4.0  CL 95*  --  97*  CO2 22  --  21*  GLUCOSE 149*  --  143*  BUN 20  --  16  CREATININE 1.46* 1.37* 1.19  CALCIUM 9.2  --  9.0   GFR: Estimated Creatinine Clearance: 67.2 mL/min (by C-G formula based on SCr of 1.19 mg/dL). Liver Function Tests: Recent Labs  Lab 05/09/18 1108 05/10/18 0534  AST 82* 43*  ALT 86* 64*  ALKPHOS 228* 186*  BILITOT 0.9 0.5  PROT 6.6 6.3*  ALBUMIN 2.4* 2.3*   Recent Labs  Lab 05/09/18 1108  LIPASE 42   No results for input(s): AMMONIA in the last 168 hours. Coagulation Profile: No results for input(s): INR, PROTIME in the last 168 hours. Cardiac Enzymes: No results for input(s): CKTOTAL, CKMB, CKMBINDEX, TROPONINI in the last 168 hours. BNP (last 3 results) No results for  input(s): PROBNP in the last 8760 hours. HbA1C: Recent Labs    05/09/18 1930  HGBA1C 6.5*   CBG: Recent Labs  Lab 05/09/18 2132 05/10/18 0827  GLUCAP 200* 135*   Lipid Profile: No results for input(s): CHOL, HDL, LDLCALC, TRIG, CHOLHDL, LDLDIRECT in the last 72 hours. Thyroid Function Tests: No results for input(s): TSH, T4TOTAL, FREET4, T3FREE, THYROIDAB in the last 72 hours. Anemia Panel: No results for input(s): VITAMINB12, FOLATE, FERRITIN, TIBC, IRON, RETICCTPCT in the last 72 hours. Urine analysis:    Component Value Date/Time   COLORURINE YELLOW 05/09/2018 1310   APPEARANCEUR HAZY (A) 05/09/2018 1310   LABSPEC 1.014 05/09/2018 1310   PHURINE 5.0 05/09/2018 1310   GLUCOSEU NEGATIVE 05/09/2018 1310   HGBUR NEGATIVE 05/09/2018 1310   BILIRUBINUR NEGATIVE 05/09/2018 1310   KETONESUR NEGATIVE 05/09/2018 1310   PROTEINUR NEGATIVE 05/09/2018 1310   NITRITE NEGATIVE 05/09/2018 1310   LEUKOCYTESUR NEGATIVE 05/09/2018 1310   Sepsis Labs: @LABRCNTIP (procalcitonin:4,lacticidven:4)  )No results found for this or any previous  visit (from the past 240 hour(s)).    Studies: Ct Abdomen Pelvis W Contrast  Result Date: 05/09/2018 CLINICAL DATA:  Abdominal pain, acute, generalized. Decreased appetite with weakness and nausea. Recent fever. EXAM: CT ABDOMEN AND PELVIS WITH CONTRAST TECHNIQUE: Multidetector CT imaging of the abdomen and pelvis was performed using the standard protocol following bolus administration of intravenous contrast. CONTRAST:  OMNIPAQUE IOHEXOL 300 MG/ML  SOLN COMPARISON:  CT abdomen pelvis 10/26/2016. FINDINGS: Lower chest: Lung bases are clear without focal nodule, mass, or airspace disease. Heart is mildly enlarged. Coronary artery calcifications are present. Patient is status post CABG. Hepatobiliary: There is diffuse fatty infiltration liver. New ill-defined multiloculated collection at the gallbladder fossa is concerning for complicated cholecystitis  and hepatic abscess. Although there is no definite solid component. Neoplasm is not excluded. No significant intrahepatic biliary dilation or adenopathy is present. The common bile duct is otherwise normal. There are some inflammatory changes adjacent. Pancreas: Unremarkable. No pancreatic ductal dilatation or surrounding inflammatory changes. Spleen: Normal in size without focal abnormality. 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 The adrenal glands are otherwise normal. A 12 mm cyst is present in the midportion left kidney. An exophytic cyst at the lower pole of the left kidney measures 26 mm. Additional 12 mm parenchymal cyst is present near the lower pole of the left kidney is well. No focal lesions are present in the right kidney. There is no stone or hydronephrosis. Ureters are within normal limits bilaterally. The urinary bladder is normal. Stomach/Bowel: Stomach is within normal limits. Inflammatory changes are present in the second portion the duodenum, adjacent to the gallbladder. Distal duodenum and small bowel are within normal limits. Surgery of the ascending colon is noted. Is some secondary inflammatory change at the hepatic flexure. Transverse colon is within normal limits. The distal descending colon demonstrates diverticular changes. Distal diverticular changes extend into the sigmoid colon or mild inflammatory changes are noted. There is no abscess or free air. Vascular/Lymphatic: Atherosclerotic calcifications are present in the aorta and branch vessels without aneurysm. No significant adenopathy is present. Reproductive: Prostate is unremarkable. Other: No abdominal wall hernia or abnormality. No abdominopelvic ascites. Musculoskeletal: Chronic endplate changes are most evident at L5-4 5 and to a lesser extent L3-4. Vertebral body heights are normal. Pelvis is unremarkable. Hips are located and within normal limits bilaterally. IMPRESSION: 1. Ill-defined  multiloculated collection at the gallbladder fossa measures 12.5 x 10.2 x 7.0 cm. This most likely represents complicated cholecystitis and hepatic abscess. Secondary inflammatory changes are present in the duodenum and colon. Gallbladder neoplasm is considered less likely. 2. Descending and sigmoid colonic diverticular disease with mild inflammatory changes in the sigmoid consistent with early diverticulitis. This likely accounts for the left lower quadrant pain. 3.  Aortic Atherosclerosis (ICD10-I70.0). 4. Multiple left renal cysts. Electronically Signed   By: Marin Roberts M.D.   On: 05/09/2018 16:54   Dg Chest Port 1 View  Result Date: 05/09/2018 CLINICAL DATA:  Cough. EXAM: PORTABLE CHEST 1 VIEW COMPARISON:  05/05/2018 and 04/10/2018 FINDINGS: The heart size and pulmonary vascularity are normal. CABG. Slight scarring in the left midzone and left base. The lungs are otherwise clear. No visible effusions. No discrete bone abnormality. IMPRESSION: No acute cardiopulmonary abnormalities. Electronically Signed   By: Francene Boyers M.D.   On: 05/09/2018 11:44    Scheduled Meds: . insulin aspart  0-15 Units Subcutaneous TID WC  . insulin aspart  0-5 Units Subcutaneous QHS  .  sodium chloride flush  3 mL Intravenous Q12H    Continuous Infusions: . 0.9 % NaCl with KCl 20 mEq / L 50 mL/hr at 05/10/18 0619  . ceFEPime (MAXIPIME) IV 1 g (05/10/18 0904)     LOS: 1 day     Myrtie NeitherNwannadiya Rolinda Impson, MD [facility]  To reach me or the doctor on call, go to: [email or web address] Password [email or web address]  05/10/2018, 10:26 AM

## 2018-05-10 NOTE — Progress Notes (Signed)
Initial Nutrition Assessment  DOCUMENTATION CODES:   Severe malnutrition in context of acute illness/injury, Obesity unspecified  INTERVENTION:    Boost Breeze po TID, each supplement provides 250 kcal and 9 grams of protein  NUTRITION DIAGNOSIS:   Severe Malnutrition related to acute illness(hepatic abscess) as evidenced by energy intake < or equal to 50% for > or equal to 5 days, percent weight loss(11% weight loss within 1 month).  GOAL:   Patient will meet greater than or equal to 90% of their needs  MONITOR:   Diet advancement, PO intake, Supplement acceptance, Skin, I & O's  REASON FOR ASSESSMENT:   Malnutrition Screening Tool    ASSESSMENT:   74 yo male with PMH of CAD s/p CABG 03/31/18, HTN, HLD, DM, gout, hepatic abscess 9 years ago who was admitted with abdominal pain; found to have a large hepatic abscess.  Plans for percutaneous cholecystostomy tube and hepatic abscess drain placement tomorrow.   Spoke with patient and his daughter. Patient has lost a lot of weight over the past month. He has been eating poorly due to poor appetite, abdominal pain, and constipation. His usual weight is ~300 lbs; now down to 245 lbs. He was in a rehab facility, then discharged home ~ 1 week prior to this admission. He has had a poor appetite since his CABG surgery in December. At home, he was eating minimally, maybe 1/2 sandwich and a small dish of yogurt for the whole day. He agreed to try Boost Breeze supplement to maximize intake while on clear liquids.   Per weight encounters, patient weighed 126.2 kg in December. 11% weight loss within one month is significant.   Labs and medications reviewed.  NUTRITION - FOCUSED PHYSICAL EXAM:    Most Recent Value  Orbital Region  No depletion  Upper Arm Region  Mild depletion  Thoracic and Lumbar Region  Mild depletion  Buccal Region  Mild depletion  Temple Region  Mild depletion  Clavicle Bone Region  Mild depletion  Clavicle and  Acromion Bone Region  Mild depletion  Scapular Bone Region  No depletion  Dorsal Hand  No depletion  Patellar Region  Mild depletion  Anterior Thigh Region  Mild depletion  Posterior Calf Region  Mild depletion  Edema (RD Assessment)  Mild  Hair  Reviewed  Eyes  Reviewed  Mouth  Reviewed  Skin  Reviewed  Nails  Reviewed       Diet Order:   Diet Order            Diet NPO time specified  Diet effective midnight        Diet clear liquid Room service appropriate? Yes; Fluid consistency: Thin  Diet effective now              EDUCATION NEEDS:   No education needs have been identified at this time  Skin:  Skin Assessment: Skin Integrity Issues: Skin Integrity Issues:: Other (Comment) Other: pressure injury to coccyx, MASD  Last BM:  1/10  Height:   Ht Readings from Last 1 Encounters:  05/09/18 5\' 11"  (1.803 m)    Weight:   Wt Readings from Last 1 Encounters:  05/10/18 111.8 kg    Ideal Body Weight:  78.2 kg  BMI:  Body mass index is 34.38 kg/m.  Estimated Nutritional Needs:   Kcal:  2200-2400  Protein:  120-140 gm  Fluid:  2.2-2.4 L    Joaquin Courts, RD, LDN, CNSC Pager [phone] After Hours Pager [phone]

## 2018-05-11 ENCOUNTER — Encounter (HOSPITAL_COMMUNITY): Payer: Self-pay | Admitting: Interventional Radiology

## 2018-05-11 ENCOUNTER — Encounter

## 2018-05-11 ENCOUNTER — Inpatient Hospital Stay (HOSPITAL_COMMUNITY): Payer: Medicare HMO

## 2018-05-11 HISTORY — PX: IR PERC CHOLECYSTOSTOMY: IMG2326

## 2018-05-11 LAB — CBC WITH DIFFERENTIAL/PLATELET
Abs Immature Granulocytes: 0.07 K/uL (ref 0.00–0.07)
Basophils Absolute: 0.1 K/uL (ref 0.0–0.1)
Basophils Relative: 1 %
Eosinophils Absolute: 0 K/uL (ref 0.0–0.5)
Eosinophils Relative: 0 %
HCT: 27.5 % — ABNORMAL LOW (ref 39.0–52.0)
Hemoglobin: 8.3 g/dL — ABNORMAL LOW (ref 13.0–17.0)
Immature Granulocytes: 1 %
Lymphocytes Relative: 9 %
Lymphs Abs: 0.9 K/uL (ref 0.7–4.0)
MCH: 25.8 pg — AB (ref 26.0–34.0)
MCHC: 30.2 g/dL (ref 30.0–36.0)
MCV: 85.4 fL (ref 80.0–100.0)
Monocytes Absolute: 1 K/uL (ref 0.1–1.0)
Monocytes Relative: 10 %
Neutro Abs: 7.8 K/uL — ABNORMAL HIGH (ref 1.7–7.7)
Neutrophils Relative %: 79 %
Platelets: 306 K/uL (ref 150–400)
RBC: 3.22 MIL/uL — AB (ref 4.22–5.81)
RDW: 17.3 % — ABNORMAL HIGH (ref 11.5–15.5)
WBC: 9.9 K/uL (ref 4.0–10.5)
nRBC: 0 % (ref 0.0–0.2)

## 2018-05-11 LAB — BASIC METABOLIC PANEL
Anion gap: 11 (ref 5–15)
BUN: 11 mg/dL (ref 8–23)
CO2: 22 mmol/L (ref 22–32)
Calcium: 8.6 mg/dL — ABNORMAL LOW (ref 8.9–10.3)
Chloride: 98 mmol/L (ref 98–111)
Creatinine, Ser: 1.1 mg/dL (ref 0.61–1.24)
GFR calc Af Amer: >60 mL/min (ref >60)
GFR calc non Af Amer: >60 mL/min (ref >60)
Glucose, Bld: 145 mg/dL — ABNORMAL HIGH (ref 70–99)
Potassium: 3.8 mmol/L (ref 3.5–5.1)
SODIUM: 131 mmol/L — AB (ref 135–145)

## 2018-05-11 LAB — GLUCOSE, CAPILLARY
GLUCOSE-CAPILLARY: 148 mg/dL — AB (ref 70–99)
GLUCOSE-CAPILLARY: 154 mg/dL — AB (ref 70–99)
Glucose-Capillary: 142 mg/dL — ABNORMAL HIGH (ref 70–99)
Glucose-Capillary: 141 mg/dL — ABNORMAL HIGH (ref 70–99)

## 2018-05-11 MED ORDER — HYDROMORPHONE HCL 1 MG/ML IJ SOLN
0.5000 mg | INTRAMUSCULAR | Status: DC | PRN
Start: 2018-05-11 — End: 2018-05-12
  Administered 2018-05-11: 1 mg via INTRAVENOUS
  Administered 2018-05-11: 0.5 mg via INTRAVENOUS
  Filled 2018-05-11 (×2): qty 1

## 2018-05-11 MED ORDER — LIDOCAINE HCL 1 % IJ SOLN
INTRAMUSCULAR | Status: AC | PRN
Start: 2018-05-11 — End: 2018-05-11
  Administered 2018-05-11: 5 mL

## 2018-05-11 MED ORDER — MIDAZOLAM HCL 2 MG/2ML IJ SOLN
INTRAMUSCULAR | Status: AC | PRN
Start: 2018-05-11 — End: 2018-05-11
  Administered 2018-05-11: 0.5 mg via INTRAVENOUS
  Administered 2018-05-11: 1 mg via INTRAVENOUS
  Administered 2018-05-11: 0.5 mg via INTRAVENOUS

## 2018-05-11 MED ORDER — MIDAZOLAM HCL 2 MG/2ML IJ SOLN
INTRAMUSCULAR | Status: AC
Start: 2018-05-11 — End: 2018-05-11
  Filled 2018-05-11: qty 4

## 2018-05-11 MED ORDER — LIDOCAINE HCL 1 % IJ SOLN
INTRAMUSCULAR | Status: AC
Start: 2018-05-11 — End: 2018-05-11
  Filled 2018-05-11: qty 20

## 2018-05-11 MED ORDER — IOPAMIDOL (ISOVUE-300) INJECTION 61%
INTRAVENOUS | Status: AC
Start: 2018-05-11 — End: 2018-05-11
  Administered 2018-05-11: 5 mL
  Filled 2018-05-11: qty 50

## 2018-05-11 MED ORDER — SODIUM CHLORIDE 0.9% FLUSH
5.0000 mL | Freq: Three times a day (TID) | INTRAVENOUS | Status: DC
Start: 2018-05-11 — End: 2018-05-15
  Administered 2018-05-11 – 2018-05-15 (×7): 5 mL

## 2018-05-11 MED ORDER — FENTANYL CITRATE (PF) 100 MCG/2ML IJ SOLN
INTRAMUSCULAR | Status: AC | PRN
Start: 2018-05-11 — End: 2018-05-11
  Administered 2018-05-11 (×2): 25 ug via INTRAVENOUS
  Administered 2018-05-11: 50 ug via INTRAVENOUS

## 2018-05-11 MED ORDER — FENTANYL CITRATE (PF) 100 MCG/2ML IJ SOLN
INTRAMUSCULAR | Status: AC
Start: 2018-05-11 — End: 2018-05-11
  Filled 2018-05-11: qty 4

## 2018-05-11 NOTE — Plan of Care (Signed)
°  Problem: Clinical Measurements: °Goal: Respiratory complications will improve °Outcome: Progressing °Goal: Cardiovascular complication will be avoided °Outcome: Progressing °  °Problem: Activity: °Goal: Risk for activity intolerance will decrease °Outcome: Progressing °  °

## 2018-05-11 NOTE — Progress Notes (Signed)
PROGRESS NOTE  [name] ZOX:[id number] DOB: 12-15-1942 DOA: 05/09/2018 PCP: Karle PlumberArvind, Moogali M, MD  HPI/Recap of past 24 hours: Patient was admitted May 09, 2018.  For right upper quadrant abdominal pain he is a 73 year old with sleep past medical history is significant for recent coronary artery bypass grafts March 31, 2018, type non-insulin-dependent atrial fibrillation congestive heart failure, history of liver abscess 9 years ago.  Patient is found to have liver and gallbladder abscess and will undergo interventional radiology for drainage tomorrow due to him being on Eliquis they are waiting for washout before they will undergo the procedure, hopefully tomorrow morning. Subjective: Patient stated he feels much better but has pain in the right lower quadrant which he rates at 3/10  May 11, 2018, subjective: Patient is complaining of sharp pain on the left right side with deep breathing  which started after his procedure today  Assessment/Plan: Principal Problem:   Liver abscess Active Problems:   Essential hypertension   Hyperlipidemia   Hx of CABG   Atrial flutter (HCC)   Anemia   Non-insulin dependent type 2 diabetes mellitus (HCC)   Acute diverticulitis   Protein-calorie malnutrition, severe   1.  Liver abscess and gallbladder abscess patient i went to the interventional radiology today for percutaneous drainage of the abscess in the gallbladder and liver 1 drainage was put in patient tolerated procedure well.  We will continue cefepime and pain management with IV Dilaudid.  2.  Coronary artery disease status post recent CABG December 2019 stable we will continue his current management  3.  Atrial fibrillation new onset on Eliquis.  Eliquis is currently on hold for possible cutaneous abscess drainage tomorrow tomorrow.Eliquis is on hold.  If needed he can be started on IV heparin.  Interventional radiology but they would rather he is not on any anticoagulation  until after the procedure  4.  Type 2 diabetes mellitus continue sliding scale insulin  5.  Acute on chronic kidney disease his creatinine was 1.37 on admission is improved to 1.1 today continue IV hydration  6.  Leukocytosis resolving  7.  Mild hyponatremia sodium is 131 today we will continue to monitor he is also on IV fluids  8.  Pleurisy may be secondary to his recent procedure we will continue him on anti-inflammatory.  Patient advised to use his pillow to brace himself and make sure he takes deep breaths.  I will order chest x-ray  Code Status: Full  Severity of Illness: The appropriate patient status for this patient is INPATIENT. Inpatient status is judged to be reasonable and necessary in order to provide the required intensity of service to ensure the patient's safety. The patient's presenting symptoms, physical exam findings, and initial radiographic and laboratory data in the context of their chronic comorbidities is felt to place them at high risk for further clinical deterioration. Furthermore, it is not anticipated that the patient will be medically stable for discharge from the hospital within 2 midnights of admission. The following factors support the patient status of inpatient.   " The patient's presenting symptoms include abdominal pain. " The worrisome physical exam findings include gallbladder and liver abscess. " The initial radiographic and laboratory data are worrisome because of gallbladder and liver abscess.  Anemia leukocytosis " The chronic co-morbidities include anemia leukocytosis.   * I certify that at the point of admission it is my clinical judgment that the patient will require inpatient hospital care spanning beyond 2 midnights from the point  of admission due to high intensity of service, high risk for further deterioration and high frequency of surveillance required.*    Family Communication: Daughter Corrie DandyMary, at bedside  Disposition Plan: Home after  procedure tomorrow when stable   Consultants:  Interventional radiology  Procedures:  None  Antimicrobials:  IV antibiotics: Cefepime  DVT prophylaxis: Full code   Objective: Vitals:   05/11/18 0955 05/11/18 1013 05/11/18 1055 05/11/18 1349  BP: (!) 141/75  (!) 152/72 133/65  Pulse: 88 85 81 (!) 104  Resp: (!) 22 (!) 22 18 17   Temp:   98.5 F (36.9 C) 99.2 F (37.3 C)  TempSrc:   Oral Oral  SpO2: 99% 97% 99% 97%  Weight:      Height:        Intake/Output Summary (Last 24 hours) at 05/11/2018 1620 Last data filed at 05/11/2018 1449 Gross per 24 hour  Intake 300 ml  Output -  Net 300 ml   Filed Weights   05/09/18 2036 05/10/18 0203  Weight: 111 kg 111.8 kg   Body mass index is 34.38 kg/m.  Exam:  . General: 73 y.o. year-old male well developed well nourished in no acute distress.  Alert and oriented x3.  Slightly obese male . Cardiovascular: Regular rate and rhythm with no rubs or gallops.  No thyromegaly or JVD noted.   Marland Kitchen. Respiratory: Clear to auscultation with no wheezes or rales. Good inspiratory effort. . Abdomen: Soft slight tenderness in the right lower quadrant tender nondistended with normal bowel sounds x4 quadrants.  Patient has a drain . Musculoskeletal: No lower extremity edema. 2/4 pulses in all 4 extremities. . Skin: No ulcerative lesions noted or rashes, . Psychiatry: Mood is appropriate for condition and setting    Data Reviewed: CBC: Recent Labs  Lab 05/09/18 1108 05/09/18 2047 05/10/18 0534 05/11/18 0257  WBC 15.7* 10.8* 10.9* 9.9  NEUTROABS  --   --   --  7.8*  HGB 9.5* 9.6* 9.0* 8.3*  HCT 30.2* 30.8* 29.4* 27.5*  MCV 86.0 86.0 85.0 85.4  PLT 350 341 319 306   Basic Metabolic Panel: Recent Labs  Lab 05/09/18 1108 05/09/18 2047 05/10/18 0534 05/11/18 0257  NA 130*  --  132* 131*  K 4.3  --  4.0 3.8  CL 95*  --  97* 98  CO2 22  --  21* 22  GLUCOSE 149*  --  143* 145*  BUN 20  --  16 11  CREATININE 1.46* 1.37* 1.19  1.10  CALCIUM 9.2  --  9.0 8.6*   GFR: Estimated Creatinine Clearance: 72.6 mL/min (by C-G formula based on SCr of 1.1 mg/dL). Liver Function Tests: Recent Labs  Lab 05/09/18 1108 05/10/18 0534  AST 82* 43*  ALT 86* 64*  ALKPHOS 228* 186*  BILITOT 0.9 0.5  PROT 6.6 6.3*  ALBUMIN 2.4* 2.3*   Recent Labs  Lab 05/09/18 1108  LIPASE 42   No results for input(s): AMMONIA in the last 168 hours. Coagulation Profile: Recent Labs  Lab 05/10/18 0907  INR 1.42   Cardiac Enzymes: No results for input(s): CKTOTAL, CKMB, CKMBINDEX, TROPONINI in the last 168 hours. BNP (last 3 results) No results for input(s): PROBNP in the last 8760 hours. HbA1C: Recent Labs    05/09/18 1930  HGBA1C 6.5*   CBG: Recent Labs  Lab 05/10/18 1218 05/10/18 1648 05/10/18 2131 05/11/18 0755 05/11/18 1236  GLUCAP 154* 111* 187* 142* 148*   Lipid Profile: No results for input(s): CHOL, HDL,  LDLCALC, TRIG, CHOLHDL, LDLDIRECT in the last 72 hours. Thyroid Function Tests: No results for input(s): TSH, T4TOTAL, FREET4, T3FREE, THYROIDAB in the last 72 hours. Anemia Panel: No results for input(s): VITAMINB12, FOLATE, FERRITIN, TIBC, IRON, RETICCTPCT in the last 72 hours. Urine analysis:    Component Value Date/Time   COLORURINE YELLOW 05/09/2018 1310   APPEARANCEUR HAZY (A) 05/09/2018 1310   LABSPEC 1.014 05/09/2018 1310   PHURINE 5.0 05/09/2018 1310   GLUCOSEU NEGATIVE 05/09/2018 1310   HGBUR NEGATIVE 05/09/2018 1310   BILIRUBINUR NEGATIVE 05/09/2018 1310   KETONESUR NEGATIVE 05/09/2018 1310   PROTEINUR NEGATIVE 05/09/2018 1310   NITRITE NEGATIVE 05/09/2018 1310   LEUKOCYTESUR NEGATIVE 05/09/2018 1310   Sepsis Labs: @LABRCNTIP (procalcitonin:4,lacticidven:4)  ) Recent Results (from the past 240 hour(s))  Aerobic/Anaerobic Culture (surgical/deep wound)     Status: None (Preliminary result)   Collection Time: 05/11/18 10:28 AM  Result Value Ref Range Status   Specimen Description  ABSCESS LIVER  Final   Special Requests Normal  Final   Gram Stain   Final    MODERATE WBC PRESENT, PREDOMINANTLY PMN MODERATE GRAM NEGATIVE RODS Performed at [facility], [address] [place], [place] [address]    Culture PENDING  Incomplete   Report Status PENDING  Incomplete      Studies: Ir Perc Cholecystostomy  Result Date: 05/11/2018 INDICATION: 73 year old male with acute gangrenous cholecystitis and associated multilocular hepatic abscess. He presents for placement of a percutaneous cholecystostomy tube as well as potential percutaneous hepatic abscess drain placement. EXAM: CHOLECYSTOSTOMY MEDICATIONS: Patient is currently on intravenous antibiotics as an inpatient. No additional antibiotic coverage was required. ANESTHESIA/SEDATION: Moderate (conscious) sedation was employed during this procedure. A total of Versed 2 mg and Fentanyl 100 mcg was administered intravenously. Moderate Sedation Time: 13 minutes. The patient's level of consciousness and vital signs were monitored continuously by radiology nursing throughout the procedure under my direct supervision. FLUOROSCOPY TIME:  Fluoroscopy Time: 0 minutes 54 seconds (8 mGy). COMPLICATIONS: None immediate. PROCEDURE: Informed written consent was obtained from the patient after a thorough discussion of the procedural risks, benefits and alternatives. All questions were addressed. Maximal Sterile Barrier Technique was utilized including caps, mask, sterile gowns, sterile gloves, sterile drape, hand hygiene and skin antiseptic. A timeout was performed prior to the initiation of the procedure. The right upper quadrant was interrogated with ultrasound. There is a complex multilocular fluid collection involving hepatic segment 4 B, 4 a and 5 surrounding the gallbladder. The gallbladder itself is distended, thick-walled and highly irregular. A suitable skin entry site was selected and marked. Local anesthesia was attained by infiltration  with 1% lidocaine. A small dermatotomy was made. Under real-time sonographic guidance, a 21 gauge trocar needle was advanced through a short transhepatic course and into the lumen of the inflamed gallbladder. A gentle hand injection of contrast material opacifies the gallbladder lumen. Of note, the roof of the gallbladder along the hepatic surface is completely discontinuous and contrast material extravasated into the abscess collection. A 0.018 wire was advanced into the gallbladder lumen. The trocar needle was removed. The transitional sheath was advanced over the wire and into the gallbladder lumen. The microwire was exchanged for a short Amplatz wire. The transhepatic tract was then dilated to 10 [place] and a Cook 10.2 [place] all-purpose drainage catheter was advanced over the wire and formed in the gallbladder lumen. Aspiration was then performed. A total of 250 mL of thick, foul-smelling purulent fluid and bile was successfully aspirated. Ultrasound was  again used to interrogate the liver. The previously identified complex hepatic abscess has been completely successfully aspirated. There is no significant residual fluid collection in which to place a second drainage catheter. Therefore, the transhepatic percutaneous cholecystostomy tube was gently flushed, connected to gravity bag drainage and secured to the skin with 0 Prolene suture. The patient tolerated procedure well. A sample of aspirated purulent fluid will be sent for Gram stain and culture. IMPRESSION: 1. Successful placement of a transhepatic percutaneous drainage catheter for acute calculus and gangrenous cholecystitis with perforation and associated hepatic abscess. 2. The disrupted gallbladder lumen communicates freely with the intrahepatic abscess and all fluid was successfully aspirated through the percutaneous cholecystostomy tube. A second drainage catheter was not necessary. PLAN: 1. Maintain tube to gravity bag drainage. 2. Flush once per  shift. 3. Follow-up in interventional radiology for cholecystostomy tube check and possible exchange in 6-8 weeks. Signed, Sterling Big, MD, RPVI [facility] Electronically Signed   By: Malachy Moan M.D.   On: 05/11/2018 10:39    Scheduled Meds: . feeding supplement  1 Container Oral TID BM  . fentaNYL      . insulin aspart  0-15 Units Subcutaneous TID WC  . insulin aspart  0-5 Units Subcutaneous QHS  . lidocaine      . midazolam      . sodium chloride flush  3 mL Intravenous Q12H  . sodium chloride flush  5 mL Intracatheter Q8H    Continuous Infusions: . ceFEPime (MAXIPIME) IV 1 g (05/11/18 1449)     LOS: 2 days     Myrtie Neither, MD [facility]  To reach me or the doctor on call, go to: [email or web address] Password [email or web address]  05/11/2018, 4:20 PM

## 2018-05-11 NOTE — Plan of Care (Signed)
  Problem: Education: Goal: Knowledge of General Education information will improve Description Including pain rating scale, medication(s)/side effects and non-pharmacologic comfort measures Outcome: Progressing   Problem: Health Behavior/Discharge Planning: Goal: Ability to manage health-related needs will improve Outcome: Progressing   Problem: Clinical Measurements: Goal: Ability to maintain clinical measurements within normal limits will improve Outcome: Progressing Goal: Will remain free from infection Outcome: Progressing Goal: Diagnostic test results will improve Outcome: Progressing Goal: Respiratory complications will improve Outcome: Progressing Goal: Cardiovascular complication will be avoided Outcome: Progressing   Problem: Activity: Goal: Risk for activity intolerance will decrease Outcome: Progressing   Problem: Nutrition: Goal: Adequate nutrition will be maintained Outcome: Progressing   Problem: Coping: Goal: Level of anxiety will decrease Outcome: Progressing   Problem: Elimination: Goal: Will not experience complications related to bowel motility Outcome: Progressing Goal: Will not experience complications related to urinary retention Outcome: Progressing   Problem: Pain Managment: Goal: General experience of comfort will improve Outcome: Progressing   Problem: Safety: Goal: Ability to remain free from injury will improve Outcome: Progressing   Problem: Skin Integrity: Goal: Risk for impaired skin integrity will decrease Outcome: Progressing   Problem: Self-Concept: Goal: Ability to identify factors that promote anxiety will improve Outcome: Progressing Goal: Level of anxiety will decrease Outcome: Progressing Goal: Ability to modify response to factors that promote anxiety will improve Outcome: Progressing

## 2018-05-11 NOTE — Progress Notes (Signed)
Patient left for Interventional Radiology 

## 2018-05-11 NOTE — Progress Notes (Signed)
Subjective/Chief Complaint: Feels a little better today. Still has a dull ache RUQ   Objective: Vital signs in last 24 hours: Temp:  [98.2 F (36.8 C)-98.6 F (37 C)] 98.4 F (36.9 C) (01/12 0529) Pulse Rate:  [86-90] 90 (01/12 0529) Resp:  [18] 18 (01/12 0529) BP: (141-151)/(70-73) 151/70 (01/12 0529) SpO2:  [95 %-99 %] 95 % (01/12 0529) Last BM Date: 05/09/18  Intake/Output from previous day: 01/11 0701 - 01/12 0700 In: 758.5 [I.V.:458.5; IV Piggyback:300] Out: -  Intake/Output this shift: No intake/output data recorded.  General appearance: alert and cooperative Resp: clear to auscultation bilaterally Cardio: regular rate and rhythm GI: soft, mild tenderness RUQ  Lab Results:  Recent Labs    05/10/18 0534 05/11/18 0257  WBC 10.9* 9.9  HGB 9.0* 8.3*  HCT 29.4* 27.5*  PLT 319 306   BMET Recent Labs    05/10/18 0534 05/11/18 0257  NA 132* 131*  K 4.0 3.8  CL 97* 98  CO2 21* 22  GLUCOSE 143* 145*  BUN 16 11  CREATININE 1.19 1.10  CALCIUM 9.0 8.6*   PT/INR Recent Labs    05/10/18 0907  LABPROT 17.2*  INR 1.42   ABG No results for input(s): PHART, HCO3 in the last 72 hours.  Invalid input(s): PCO2, PO2  Studies/Results: Ct Abdomen Pelvis W Contrast  Result Date: 05/09/2018 CLINICAL DATA:  Abdominal pain, acute, generalized. Decreased appetite with weakness and nausea. Recent fever. EXAM: CT ABDOMEN AND PELVIS WITH CONTRAST TECHNIQUE: Multidetector CT imaging of the abdomen and pelvis was performed using the standard protocol following bolus administration of intravenous contrast. CONTRAST:  OMNIPAQUE IOHEXOL 300 MG/ML  SOLN COMPARISON:  CT abdomen pelvis 10/26/2016. FINDINGS: Lower chest: Lung bases are clear without focal nodule, mass, or airspace disease. Heart is mildly enlarged. Coronary artery calcifications are present. Patient is status post CABG. Hepatobiliary: There is diffuse fatty infiltration liver. New ill-defined multiloculated  collection at the gallbladder fossa is concerning for complicated cholecystitis and hepatic abscess. Although there is no definite solid component. Neoplasm is not excluded. No significant intrahepatic biliary dilation or adenopathy is present. The common bile duct is otherwise normal. There are some inflammatory changes adjacent. Pancreas: Unremarkable. No pancreatic ductal dilatation or surrounding inflammatory changes. Spleen: Normal in size without focal abnormality. 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 The adrenal glands are otherwise normal. A 12 mm cyst is present in the midportion left kidney. An exophytic cyst at the lower pole of the left kidney measures 26 mm. Additional 12 mm parenchymal cyst is present near the lower pole of the left kidney is well. No focal lesions are present in the right kidney. There is no stone or hydronephrosis. Ureters are within normal limits bilaterally. The urinary bladder is normal. Stomach/Bowel: Stomach is within normal limits. Inflammatory changes are present in the second portion the duodenum, adjacent to the gallbladder. Distal duodenum and small bowel are within normal limits. Surgery of the ascending colon is noted. Is some secondary inflammatory change at the hepatic flexure. Transverse colon is within normal limits. The distal descending colon demonstrates diverticular changes. Distal diverticular changes extend into the sigmoid colon or mild inflammatory changes are noted. There is no abscess or free air. Vascular/Lymphatic: Atherosclerotic calcifications are present in the aorta and branch vessels without aneurysm. No significant adenopathy is present. Reproductive: Prostate is unremarkable. Other: No abdominal wall hernia or abnormality. No abdominopelvic ascites. Musculoskeletal: Chronic endplate changes are most evident at  L5-4 5 and to a lesser extent L3-4. Vertebral body heights are normal. Pelvis is unremarkable.  Hips are located and within normal limits bilaterally. IMPRESSION: 1. Ill-defined multiloculated collection at the gallbladder fossa measures 12.5 x 10.2 x 7.0 cm. This most likely represents complicated cholecystitis and hepatic abscess. Secondary inflammatory changes are present in the duodenum and colon. Gallbladder neoplasm is considered less likely. 2. Descending and sigmoid colonic diverticular disease with mild inflammatory changes in the sigmoid consistent with early diverticulitis. This likely accounts for the left lower quadrant pain. 3.  Aortic Atherosclerosis (ICD10-I70.0). 4. Multiple left renal cysts. Electronically Signed   By: Marin Roberts M.D.   On: 05/09/2018 16:54   Dg Chest Port 1 View  Result Date: 05/09/2018 CLINICAL DATA:  Cough. EXAM: PORTABLE CHEST 1 VIEW COMPARISON:  05/05/2018 and 04/10/2018 FINDINGS: The heart size and pulmonary vascularity are normal. CABG. Slight scarring in the left midzone and left base. The lungs are otherwise clear. No visible effusions. No discrete bone abnormality. IMPRESSION: No acute cardiopulmonary abnormalities. Electronically Signed   By: Francene Boyers M.D.   On: 05/09/2018 11:44    Anti-infectives: Anti-infectives (From admission, onward)   Start     Dose/Rate Route Frequency Ordered Stop   05/09/18 2100  metroNIDAZOLE (FLAGYL) IVPB 500 mg     500 mg 100 mL/hr over 60 Minutes Intravenous  Once 05/09/18 2006 05/09/18 2255   05/09/18 1830  ceFEPIme (MAXIPIME) 1 g in sodium chloride 0.9 % 100 mL IVPB     1 g 200 mL/hr over 30 Minutes Intravenous Every 8 hours 05/09/18 1816     05/09/18 1800  metroNIDAZOLE (FLAGYL) IVPB 500 mg  Status:  Discontinued     500 mg 100 mL/hr over 60 Minutes Intravenous  Once 05/09/18 1758 05/09/18 2006      Assessment/Plan: s/p * No surgery found * Plan for IR to perc drain liver abscess today  Continue abx Will start diet after procedure  LOS: 2 days    Chevis Pretty III 05/11/2018

## 2018-05-11 NOTE — Procedures (Signed)
Interventional Radiology Procedure Note  Procedure: Placement of a 64F percutaneous cholecystostomy tube.  A 2nd hepatic abscess drain was not necessary as the perforated GB communicated with the abscess and over 250 mL purulent fluid was successfully aspirated.  Drain to gravity  Complications: None  Estimated Blood Loss: None  Recommendations: - Cultures pending - Drain to gravity - Flush Q shift   Signed,  Sterling Big, MD

## 2018-05-12 ENCOUNTER — Encounter

## 2018-05-12 ENCOUNTER — Ambulatory Visit: Payer: Medicare HMO

## 2018-05-12 DIAGNOSIS — Z934 Other artificial openings of gastrointestinal tract status: Secondary | ICD-10-CM

## 2018-05-12 DIAGNOSIS — K810 Acute cholecystitis: Principal | ICD-10-CM

## 2018-05-12 DIAGNOSIS — Z881 Allergy status to other antibiotic agents status: Secondary | ICD-10-CM

## 2018-05-12 DIAGNOSIS — R748 Abnormal levels of other serum enzymes: Secondary | ICD-10-CM

## 2018-05-12 DIAGNOSIS — E119 Type 2 diabetes mellitus without complications: Secondary | ICD-10-CM

## 2018-05-12 DIAGNOSIS — I4892 Unspecified atrial flutter: Secondary | ICD-10-CM

## 2018-05-12 DIAGNOSIS — E785 Hyperlipidemia, unspecified: Secondary | ICD-10-CM

## 2018-05-12 DIAGNOSIS — Z951 Presence of aortocoronary bypass graft: Secondary | ICD-10-CM

## 2018-05-12 DIAGNOSIS — K750 Abscess of liver: Secondary | ICD-10-CM

## 2018-05-12 DIAGNOSIS — Z87891 Personal history of nicotine dependence: Secondary | ICD-10-CM

## 2018-05-12 DIAGNOSIS — I2510 Atherosclerotic heart disease of native coronary artery without angina pectoris: Secondary | ICD-10-CM

## 2018-05-12 DIAGNOSIS — Z8719 Personal history of other diseases of the digestive system: Secondary | ICD-10-CM

## 2018-05-12 DIAGNOSIS — D649 Anemia, unspecified: Secondary | ICD-10-CM

## 2018-05-12 DIAGNOSIS — I10 Essential (primary) hypertension: Secondary | ICD-10-CM

## 2018-05-12 DIAGNOSIS — K82A2 Perforation of gallbladder in cholecystitis: Secondary | ICD-10-CM

## 2018-05-12 LAB — CBC WITH DIFFERENTIAL/PLATELET
Abs Immature Granulocytes: 0.09 K/uL — ABNORMAL HIGH (ref 0.00–0.07)
Basophils Absolute: 0.1 K/uL (ref 0.0–0.1)
Basophils Relative: 1 %
Eosinophils Absolute: 0.1 K/uL (ref 0.0–0.5)
Eosinophils Relative: 1 %
HCT: 27.2 % — ABNORMAL LOW (ref 39.0–52.0)
Hemoglobin: 8.3 g/dL — ABNORMAL LOW (ref 13.0–17.0)
Immature Granulocytes: 1 %
Lymphocytes Relative: 13 %
Lymphs Abs: 1.2 K/uL (ref 0.7–4.0)
MCH: 26.4 pg (ref 26.0–34.0)
MCHC: 30.5 g/dL (ref 30.0–36.0)
MCV: 86.6 fL (ref 80.0–100.0)
Monocytes Absolute: 1.1 K/uL — ABNORMAL HIGH (ref 0.1–1.0)
Monocytes Relative: 11 %
NEUTROS ABS: 7 K/uL (ref 1.7–7.7)
Neutrophils Relative %: 73 %
Platelets: 354 K/uL (ref 150–400)
RBC: 3.14 MIL/uL — ABNORMAL LOW (ref 4.22–5.81)
RDW: 17.5 % — ABNORMAL HIGH (ref 11.5–15.5)
WBC: 9.5 K/uL (ref 4.0–10.5)
nRBC: 0 % (ref 0.0–0.2)

## 2018-05-12 LAB — GLUCOSE, CAPILLARY
Glucose-Capillary: 128 mg/dL — ABNORMAL HIGH (ref 70–99)
Glucose-Capillary: 133 mg/dL — ABNORMAL HIGH (ref 70–99)
Glucose-Capillary: 148 mg/dL — ABNORMAL HIGH (ref 70–99)

## 2018-05-12 LAB — IRON AND TIBC
Iron: 12 ug/dL — ABNORMAL LOW (ref 45–182)
Saturation Ratios: 6 % — ABNORMAL LOW (ref 17.9–39.5)
TIBC: 190 ug/dL — ABNORMAL LOW (ref 250–450)
UIBC: 178 ug/dL

## 2018-05-12 LAB — BASIC METABOLIC PANEL
ANION GAP: 10 (ref 5–15)
BUN: 11 mg/dL (ref 8–23)
CHLORIDE: 98 mmol/L (ref 98–111)
CO2: 22 mmol/L (ref 22–32)
Calcium: 8.7 mg/dL — ABNORMAL LOW (ref 8.9–10.3)
Creatinine, Ser: 1.14 mg/dL (ref 0.61–1.24)
GFR calc Af Amer: >60 mL/min (ref >60)
GFR calc non Af Amer: >60 mL/min (ref >60)
Glucose, Bld: 151 mg/dL — ABNORMAL HIGH (ref 70–99)
POTASSIUM: 4.1 mmol/L (ref 3.5–5.1)
Sodium: 130 mmol/L — ABNORMAL LOW (ref 135–145)

## 2018-05-12 MED ORDER — METRONIDAZOLE 500 MG PO TABS
500.0000 mg | Freq: Three times a day (TID) | ORAL | Status: DC
Start: 2018-05-12 — End: 2018-05-15
  Administered 2018-05-12 – 2018-05-15 (×9): 500 mg via ORAL
  Filled 2018-05-12 (×10): qty 1

## 2018-05-12 MED ORDER — LORATADINE 10 MG PO TABS
10.0000 mg | Freq: Every day | ORAL | Status: DC
Start: 2018-05-12 — End: 2018-05-15
  Administered 2018-05-12 – 2018-05-15 (×4): 10 mg via ORAL
  Filled 2018-05-12 (×4): qty 1

## 2018-05-12 MED ORDER — DOCUSATE SODIUM 100 MG PO CAPS
100.0000 mg | Freq: Two times a day (BID) | ORAL | Status: DC
Start: 2018-05-12 — End: 2018-05-15
  Administered 2018-05-12 – 2018-05-15 (×7): 100 mg via ORAL
  Filled 2018-05-12 (×7): qty 1

## 2018-05-12 MED ORDER — FINASTERIDE 5 MG PO TABS
5.0000 mg | Freq: Every day | ORAL | Status: DC
Start: 2018-05-12 — End: 2018-05-15
  Administered 2018-05-12 – 2018-05-15 (×4): 5 mg via ORAL
  Filled 2018-05-12 (×4): qty 1

## 2018-05-12 MED ORDER — HYDROMORPHONE HCL 1 MG/ML IJ SOLN
2.0000 mg | INTRAMUSCULAR | Status: DC | PRN
Start: 2018-05-12 — End: 2018-05-15
  Administered 2018-05-12 – 2018-05-13 (×4): 2 mg via INTRAVENOUS
  Filled 2018-05-12 (×4): qty 2

## 2018-05-12 MED ORDER — APIXABAN 5 MG PO TABS
5.0000 mg | Freq: Two times a day (BID) | ORAL | Status: DC
Start: 2018-05-12 — End: 2018-05-15
  Administered 2018-05-12 – 2018-05-15 (×6): 5 mg via ORAL
  Filled 2018-05-12 (×6): qty 1

## 2018-05-12 MED ORDER — PRAVASTATIN SODIUM 40 MG PO TABS
40.0000 mg | Freq: Every day | ORAL | Status: DC
Start: 2018-05-12 — End: 2018-05-12

## 2018-05-12 MED ORDER — METOPROLOL TARTRATE 25 MG PO TABS
25.0000 mg | Freq: Two times a day (BID) | ORAL | Status: DC
Start: 2018-05-12 — End: 2018-05-15
  Administered 2018-05-12 – 2018-05-15 (×7): 25 mg via ORAL
  Filled 2018-05-12 (×7): qty 1

## 2018-05-12 MED ORDER — AMIODARONE HCL 200 MG PO TABS
200.0000 mg | Freq: Every day | ORAL | Status: DC
Start: 2018-05-12 — End: 2018-05-15
  Administered 2018-05-12 – 2018-05-15 (×4): 200 mg via ORAL
  Filled 2018-05-12 (×4): qty 1

## 2018-05-12 MED ORDER — FUROSEMIDE 40 MG PO TABS
40.0000 mg | Freq: Every day | ORAL | Status: DC
Start: 2018-05-12 — End: 2018-05-14
  Administered 2018-05-12 – 2018-05-13 (×2): 40 mg via ORAL
  Filled 2018-05-12 (×2): qty 1

## 2018-05-12 NOTE — Progress Notes (Signed)
Patient c/o pain unrelieved by available narcotics. Text paged Dr. Clearence Ped

## 2018-05-12 NOTE — Plan of Care (Signed)
  Problem: Activity: Goal: Risk for activity intolerance will decrease Outcome: Progressing   Problem: Nutrition: Goal: Adequate nutrition will be maintained Outcome: Progressing   

## 2018-05-12 NOTE — Consult Note (Signed)
[facility]    Date of Admission:  05/09/2018   Total days of antibiotics 4        Day 3 Cefepime               Reason for Consult: Liver Abscess, Pyogenic Cholecystitis    Referring Provider: Eddie North, MD Primary Care Provider: Barney Drain, MD  Assessment: [name] is a 73 year old male with diabetes, recent CABG x4, and prior liver absess who is admitted with a loculated liver abscess and acute gangrenous cholecystitis, now status post cholecystostomy tube placement. Purulent material from cholecystostomy was cultured and grew moderate gram negative rods, now undergoing reincubation. Patient currently on Cefepime and has remained afebrile with resolution of leukocytosis. Given scant drainage from cholecystomy tube and size / loculated nature of fluid collection, patient will need repeat imaging if output does not improve and may need additional percutaneous drain(s) to ensure complete drainage. Will continue Cefepime (considering recent hospitalization and ICU stay) and add metronidazole for anaerobic coverage while awaiting culture results. Will need at least 4 weeks of total antibiotic therapy.  Plan:  1. Liver Abscess secondary to Acute Gangrenous Cholecystitis - Continue Cefepime - Start PO Metronidazole - Monitor drain output, repeat imaging if not improving  - Encourage mobilization to augment drainage  Principal Problem:   Liver abscess Active Problems:   Acute gangrenous cholecystitis   Non-insulin dependent type 2 diabetes mellitus (HCC)   Essential hypertension   Hyperlipidemia   Hx of CABG   Atrial flutter (HCC)   Anemia   Protein-calorie malnutrition, severe   Scheduled Meds: . amiodarone  200 mg Oral Daily  . apixaban  5 mg Oral BID  . docusate sodium  100 mg Oral BID  . feeding supplement  1 Container Oral TID BM  . finasteride  5 mg Oral Daily  . furosemide  40 mg Oral Daily  . insulin aspart  0-15 Units  Subcutaneous TID WC  . insulin aspart  0-5 Units Subcutaneous QHS  . loratadine  10 mg Oral Daily  . metoprolol tartrate  25 mg Oral BID  . sodium chloride flush  3 mL Intravenous Q12H  . sodium chloride flush  5 mL Intracatheter Q8H   Continuous Infusions: . ceFEPime (MAXIPIME) IV 1 g (05/12/18 0941)   PRN Meds:.acetaminophen **OR** acetaminophen, HYDROmorphone (DILAUDID) injection, menthol-cetylpyridinium, ondansetron **OR** ondansetron (ZOFRAN) IV, oxyCODONE, polyethylene glycol, traZODone  HPI: [name] is a 73 y.o. male with a history of CAD (status post CABG x4 on 03/31/2018); Diabetes Type 2, Hypertension, Hyperlipemia, Anemia, and Liver Abscess (June 2011) who presented on 05/09/18 with days to weeks of decreased appetites, nausea, weakness, and light headedness. He had been recovering for coronary bypass sugery in December, but felt he was getting worse despite being told at rehab that he was improving. He states he had also noticed some dull right upper quadrant pain. On initial presentation he was noted to have a tender right upper quadrant and leukocytosis to 15.7, and LFT elevation with a cholestatic pattern. A CT of his abdomen showed a multiloculated fluid collection measuring 12.5 x 10.2 x 7.0cm at the gall bladder fossa and liver with secondary inflammatory changes of the duodenum and colon as well as mild inflammation of descending colon consistent with early diverticulitis. He was started on broad spectrum antibiotic coverage with cefepime and given a dose of metronidazole. On 05/11/18 Interventional radiology place a cholecystostomy tube  which was noted to communicate with part of the liver abscess so a second drain was not placed at that time. Initially 250cc of purulent material was drain and sent for culture. Since the initial output, no significant drainage has been noted and the continues to flush well. Leukocytosis has resolved and patient has remained afebrile. Infectious  disease has been consulted for guidance on antibiotic selection and duration.   Review of Systems: Review of Systems  Constitutional: Negative for chills and fever.  Gastrointestinal: Positive for abdominal pain.    Past Medical History:  Diagnosis Date  . Allergies   . Anxiety   . CAD (coronary artery disease)   . Chest pain on exertion   . Diabetes (HCC)   . Dyspnea    upon exertion  . Gout   . Hepatitis    at 73 years old  . Hyperlipemia   . Hypertension     Social History   Tobacco Use  . Smoking status: Former Smoker    Types: Cigarettes    Last attempt to quit: 1982    Years since quitting: 38.0  . Smokeless tobacco: Never Used  . Tobacco comment: quit 1982  Substance Use Topics  . Alcohol use: Not Currently  . Drug use: Never    Family History  Problem Relation Age of Onset  . Atrial fibrillation Mother   . Stroke Mother   . Cancer Maternal Aunt    Allergies  Allergen Reactions  . Ciprofloxacin Rash  . Erythromycin Ethylsuccinate Rash    OBJECTIVE: Blood pressure 134/66, pulse 90, temperature 98.6 F (37 C), temperature source Oral, resp. rate 16, height 5\' 11"  (1.803 m), weight 113.3 kg, SpO2 93 %.  Physical Exam Constitutional:      General: He is not in acute distress.    Appearance: He is obese.     Comments: Drowsy  Cardiovascular:     Rate and Rhythm: Normal rate and regular rhythm.     Heart sounds: Normal heart sounds.  Pulmonary:     Effort: Pulmonary effort is normal. No respiratory distress.     Breath sounds: Normal breath sounds.  Abdominal:     Palpations: Abdomen is soft.     Comments: Tender to palpation of RUQ Cholecystostomy tube in place with clean dry dressing at site and scant serosanguinous drainage Mildly distended versus Obese abdomen  Musculoskeletal:        General: No swelling or tenderness.     Right lower leg: No edema.     Left lower leg: No edema.  Skin:    General: Skin is warm and dry.  Neurological:       Mental Status: He is alert.  Psychiatric:        Mood and Affect: Mood normal.        Behavior: Behavior normal.     Lab Results Lab Results  Component Value Date   WBC 9.5 05/12/2018   HGB 8.3 (L) 05/12/2018   HCT 27.2 (L) 05/12/2018   MCV 86.6 05/12/2018   PLT 354 05/12/2018    Lab Results  Component Value Date   CREATININE 1.14 05/12/2018   BUN 11 05/12/2018   NA 130 (L) 05/12/2018   K 4.1 05/12/2018   CL 98 05/12/2018   CO2 22 05/12/2018    Lab Results  Component Value Date   ALT 64 (H) 05/10/2018   AST 43 (H) 05/10/2018   ALKPHOS 186 (H) 05/10/2018   BILITOT 0.5 05/10/2018  Microbiology: Recent Results (from the past 240 hour(s))  Aerobic/Anaerobic Culture (surgical/deep wound)     Status: None (Preliminary result)   Collection Time: 05/11/18 10:28 AM  Result Value Ref Range Status   Specimen Description ABSCESS LIVER  Final   Special Requests Normal  Final   Gram Stain   Final    MODERATE WBC PRESENT, PREDOMINANTLY PMN MODERATE GRAM NEGATIVE RODS    Culture   Final    CULTURE REINCUBATED FOR BETTER GROWTH Performed at [facility], [address] [place], [place] [address]    Report Status PENDING  Incomplete    Beola CordAlexander , MD IM PGY-2 [phone] Attending [phone] pager   [phone] cell 05/12/2018, 4:05 PM

## 2018-05-12 NOTE — Progress Notes (Addendum)
PROGRESS NOTE                                                                                                                                                                                                             Patient Demographics:    [name], is a 73 y.o. male, DOB - [birth year], WUJ:[id number]  Admit date - 05/09/2018   Admitting Physician Lahoma Crockerheresa C Sheehan, MD  Outpatient Primary MD for the patient is Karle PlumberArvind, Moogali M, MD  LOS - 3  Outpatient Specialists: Cardiology  Chief Complaint  Patient presents with  . Nausea  . Weakness       Brief Narrative 73 year old male with history of CAD, recent CABG, a flutter (recent) on Eliquis, type 2 diabetes mellitus, severe protein calorie malnutrition, hyperlipidemia with prior history of liver abscess presented with weakness and fatigue, poor p.o. intake and nausea with findings of 12 cm abscess in the gallbladder fossa and the liver. Patient had a placement of a 10 F percutaneous cholecystostomy tube by IR on 1/12.  A second hepatic abscess drain was not needed as the perforated gallbladder communicated with the abscess.  >250 mL purulent fluid was aspirated.    Subjective:   Patient complains of right upper quadrant pain on deep inspiration.  Reports that oxycodone does not help and is only relieved with Dilaudid.  No output on the drain this morning.  Remains afebrile.  Assessment  & Plan :    Principal Problem:   Liver abscess Status post percutaneous cholecystostomy drain on 1/12.  Etiology unclear and had prior liver abscess, admitted at [facility] in 2011 and drained.  The culture grew group B strep (not enterococcus) and Klebsiella which was pansensitive.  Per note he was discharged on 2 weeks of Augmentin. Drain flushed today and bloody purulent output noted. Continue empiric cefepime.  Afebrile and leukocytosis improved. Follow fluid culture and will  determine duration and course of antibiotic.  I will consult ID. Pain control with oxycodone 10 mg every 4 hours as needed and I have changed Dilaudid to 2 mg every 4 hours as needed for better pain control.  Added bowel regimen.  Surgery following.  Okay to resume Eliquis . recommend patient will need outpatient follow-up with Dr Donell Beersbyerly for hepatic resection and cholecystectomy.  Active Problems: CAD with recent CABG in December 2019  Stable.  Continue beta-blocker, Lasix.  Aspirin on hold.  Mild transaminitis, continue to hold statin.  A flutter Rate controlled.  Eliquis on hold for procedure.  Will discuss with IR on resuming his Eliquis.     Anemia, unspecified Check iron panel    Non-insulin dependent type 2 diabetes mellitus (HCC) Holding metformin.  Monitor on sliding scale coverage.  Transaminitis Mild.  Hold statin.    Protein-calorie malnutrition, severe    Code Status : Full code  Family Communication  : None at bedside  Disposition Plan  : Pending hospital course  Barriers For Discharge : Active symptoms  Consults  : [place] surgery, IR, ID  Procedures  : CT abdomen pelvis, IR cholecystostomy drain  DVT Prophylaxis  : On Eliquis (held for procedure)  Lab Results  Component Value Date   PLT 354 05/12/2018    Antibiotics  :    Anti-infectives (From admission, onward)   Start     Dose/Rate Route Frequency Ordered Stop   05/09/18 2100  metroNIDAZOLE (FLAGYL) IVPB 500 mg     500 mg 100 mL/hr over 60 Minutes Intravenous  Once 05/09/18 2006 05/09/18 2255   05/09/18 1830  ceFEPIme (MAXIPIME) 1 g in sodium chloride 0.9 % 100 mL IVPB     1 g 200 mL/hr over 30 Minutes Intravenous Every 8 hours 05/09/18 1816     05/09/18 1800  metroNIDAZOLE (FLAGYL) IVPB 500 mg  Status:  Discontinued     500 mg 100 mL/hr over 60 Minutes Intravenous  Once 05/09/18 1758 05/09/18 2006        Objective:   Vitals:   05/11/18 1718 05/11/18 2039 05/12/18 0417 05/12/18 0500    BP: 128/79 129/64 126/67   Pulse: (!) 101 80 89   Resp: 17 16 16    Temp: 97.8 F (36.6 C) 98.2 F (36.8 C) 98 F (36.7 C)   TempSrc: Oral Oral Oral   SpO2: 96% 95% 95%   Weight:    113.3 kg  Height:        Wt Readings from Last 3 Encounters:  05/12/18 113.3 kg  05/05/18 117.9 kg  05/01/18 119 kg     Intake/Output Summary (Last 24 hours) at 05/12/2018 1314 Last data filed at 05/12/2018 0615 Gross per 24 hour  Intake 455 ml  Output 0 ml  Net 455 ml     Physical Exam  Gen: not in distress, fatigued HEENT: Pallor present, moist mucosa, supple neck Chest: clear b/l, no added sounds CVS: N S1&S2, no murmurs, rubs or gallop GI: soft, nondistended, right upper quadrant tenderness, cholecystostomy drain in place with scanty output, Musculoskeletal: warm, no edema     Data Review:    CBC Recent Labs  Lab 05/09/18 1108 05/09/18 2047 05/10/18 0534 05/11/18 0257 05/12/18 0321  WBC 15.7* 10.8* 10.9* 9.9 9.5  HGB 9.5* 9.6* 9.0* 8.3* 8.3*  HCT 30.2* 30.8* 29.4* 27.5* 27.2*  PLT 350 341 319 306 354  MCV 86.0 86.0 85.0 85.4 86.6  MCH 27.1 26.8 26.0 25.8* 26.4  MCHC 31.5 31.2 30.6 30.2 30.5  RDW 17.2* 17.3* 17.3* 17.3* 17.5*  LYMPHSABS  --   --   --  0.9 1.2  MONOABS  --   --   --  1.0 1.1*  EOSABS  --   --   --  0.0 0.1  BASOSABS  --   --   --  0.1 0.1    Chemistries  Recent Labs  Lab 05/09/18 1108 05/09/18 2047  05/10/18 0534 05/11/18 0257 05/12/18 0321  NA 130*  --  132* 131* 130*  K 4.3  --  4.0 3.8 4.1  CL 95*  --  97* 98 98  CO2 22  --  21* 22 22  GLUCOSE 149*  --  143* 145* 151*  BUN 20  --  16 11 11   CREATININE 1.46* 1.37* 1.19 1.10 1.14  CALCIUM 9.2  --  9.0 8.6* 8.7*  AST 82*  --  43*  --   --   ALT 86*  --  64*  --   --   ALKPHOS 228*  --  186*  --   --   BILITOT 0.9  --  0.5  --   --    ------------------------------------------------------------------------------------------------------------------ No results for input(s): CHOL, HDL,  LDLCALC, TRIG, CHOLHDL, LDLDIRECT in the last 72 hours.  Lab Results  Component Value Date   HGBA1C 6.5 (H) 05/09/2018   ------------------------------------------------------------------------------------------------------------------ No results for input(s): TSH, T4TOTAL, T3FREE, THYROIDAB in the last 72 hours.  Invalid input(s): FREET3 ------------------------------------------------------------------------------------------------------------------ No results for input(s): VITAMINB12, FOLATE, FERRITIN, TIBC, IRON, RETICCTPCT in the last 72 hours.  Coagulation profile Recent Labs  Lab 05/10/18 0907  INR 1.42    No results for input(s): DDIMER in the last 72 hours.  Cardiac Enzymes No results for input(s): CKMB, TROPONINI, MYOGLOBIN in the last 168 hours.  Invalid input(s): CK ------------------------------------------------------------------------------------------------------------------ No results found for: BNP  Inpatient Medications  Scheduled Meds: . feeding supplement  1 Container Oral TID BM  . insulin aspart  0-15 Units Subcutaneous TID WC  . insulin aspart  0-5 Units Subcutaneous QHS  . sodium chloride flush  3 mL Intravenous Q12H  . sodium chloride flush  5 mL Intracatheter Q8H   Continuous Infusions: . ceFEPime (MAXIPIME) IV 1 g (05/12/18 0941)   PRN Meds:.acetaminophen **OR** acetaminophen, HYDROmorphone (DILAUDID) injection, menthol-cetylpyridinium, ondansetron **OR** ondansetron (ZOFRAN) IV, oxyCODONE, polyethylene glycol, traZODone  Micro Results Recent Results (from the past 240 hour(s))  Aerobic/Anaerobic Culture (surgical/deep wound)     Status: None (Preliminary result)   Collection Time: 05/11/18 10:28 AM  Result Value Ref Range Status   Specimen Description ABSCESS LIVER  Final   Special Requests Normal  Final   Gram Stain   Final    MODERATE WBC PRESENT, PREDOMINANTLY PMN MODERATE GRAM NEGATIVE RODS Performed at [facility],  [address] [place], [place] [address]    Culture PENDING  Incomplete   Report Status PENDING  Incomplete    Radiology Reports Dg Chest 2 View  Result Date: 05/11/2018 CLINICAL DATA:  Shortness of breath EXAM: CHEST - 2 VIEW COMPARISON:  05/09/2018 FINDINGS: Lungs are clear.  No pleural effusion or pneumothorax. The heart is top-normal in size. Postsurgical changes related to prior CABG. Mild degenerative changes of the visualized thoracolumbar spine. Median sternotomy. IMPRESSION: Normal chest radiographs. Electronically Signed   By: Charline BillsSriyesh  Krishnan M.D.   On: 05/11/2018 21:36   Dg Chest 2 View  Result Date: 05/05/2018 CLINICAL DATA:  Dry cough and shortness of breath.  Recent CABG. EXAM: CHEST - 2 VIEW COMPARISON:  Chest x-ray dated April 10, 2018. FINDINGS: Stable mild cardiomegaly status post CABG. Normal pulmonary vascularity. Unchanged peripheral scarring in the left mid lung. New trace left pleural effusion. The right lung is clear. No pneumothorax. No acute osseous abnormality. IMPRESSION: 1. New trace left pleural effusion. Electronically Signed   By: Obie DredgeWilliam T Derry M.D.   On: 05/05/2018 11:05   Ct Abdomen  Pelvis W Contrast  Result Date: 05/09/2018 CLINICAL DATA:  Abdominal pain, acute, generalized. Decreased appetite with weakness and nausea. Recent fever. EXAM: CT ABDOMEN AND PELVIS WITH CONTRAST TECHNIQUE: Multidetector CT imaging of the abdomen and pelvis was performed using the standard protocol following bolus administration of intravenous contrast. CONTRAST:  OMNIPAQUE IOHEXOL 300 MG/ML  SOLN COMPARISON:  CT abdomen pelvis 10/26/2016. FINDINGS: Lower chest: Lung bases are clear without focal nodule, mass, or airspace disease. Heart is mildly enlarged. Coronary artery calcifications are present. Patient is status post CABG. Hepatobiliary: There is diffuse fatty infiltration liver. New ill-defined multiloculated collection at the gallbladder fossa is concerning for  complicated cholecystitis and hepatic abscess. Although there is no definite solid component. Neoplasm is not excluded. No significant intrahepatic biliary dilation or adenopathy is present. The common bile duct is otherwise normal. There are some inflammatory changes adjacent. Pancreas: Unremarkable. No pancreatic ductal dilatation or surrounding inflammatory changes. Spleen: Normal in size without focal abnormality. 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 The adrenal glands are otherwise normal. A 12 mm cyst is present in the midportion left kidney. An exophytic cyst at the lower pole of the left kidney measures 26 mm. Additional 12 mm parenchymal cyst is present near the lower pole of the left kidney is well. No focal lesions are present in the right kidney. There is no stone or hydronephrosis. Ureters are within normal limits bilaterally. The urinary bladder is normal. Stomach/Bowel: Stomach is within normal limits. Inflammatory changes are present in the second portion the duodenum, adjacent to the gallbladder. Distal duodenum and small bowel are within normal limits. Surgery of the ascending colon is noted. Is some secondary inflammatory change at the hepatic flexure. Transverse colon is within normal limits. The distal descending colon demonstrates diverticular changes. Distal diverticular changes extend into the sigmoid colon or mild inflammatory changes are noted. There is no abscess or free air. Vascular/Lymphatic: Atherosclerotic calcifications are present in the aorta and branch vessels without aneurysm. No significant adenopathy is present. Reproductive: Prostate is unremarkable. Other: No abdominal wall hernia or abnormality. No abdominopelvic ascites. Musculoskeletal: Chronic endplate changes are most evident at L5-4 5 and to a lesser extent L3-4. Vertebral body heights are normal. Pelvis is unremarkable. Hips are located and within normal limits bilaterally.  IMPRESSION: 1. Ill-defined multiloculated collection at the gallbladder fossa measures 12.5 x 10.2 x 7.0 cm. This most likely represents complicated cholecystitis and hepatic abscess. Secondary inflammatory changes are present in the duodenum and colon. Gallbladder neoplasm is considered less likely. 2. Descending and sigmoid colonic diverticular disease with mild inflammatory changes in the sigmoid consistent with early diverticulitis. This likely accounts for the left lower quadrant pain. 3.  Aortic Atherosclerosis (ICD10-I70.0). 4. Multiple left renal cysts. Electronically Signed   By: Marin Roberts M.D.   On: 05/09/2018 16:54   Ir Perc Cholecystostomy  Result Date: 05/11/2018 INDICATION: 73 year old male with acute gangrenous cholecystitis and associated multilocular hepatic abscess. He presents for placement of a percutaneous cholecystostomy tube as well as potential percutaneous hepatic abscess drain placement. EXAM: CHOLECYSTOSTOMY MEDICATIONS: Patient is currently on intravenous antibiotics as an inpatient. No additional antibiotic coverage was required. ANESTHESIA/SEDATION: Moderate (conscious) sedation was employed during this procedure. A total of Versed 2 mg and Fentanyl 100 mcg was administered intravenously. Moderate Sedation Time: 13 minutes. The patient's level of consciousness and vital signs were monitored continuously by radiology nursing throughout the procedure under my direct supervision. FLUOROSCOPY TIME:  Fluoroscopy Time:  0 minutes 54 seconds (8 mGy). COMPLICATIONS: None immediate. PROCEDURE: Informed written consent was obtained from the patient after a thorough discussion of the procedural risks, benefits and alternatives. All questions were addressed. Maximal Sterile Barrier Technique was utilized including caps, mask, sterile gowns, sterile gloves, sterile drape, hand hygiene and skin antiseptic. A timeout was performed prior to the initiation of the procedure. The right upper  quadrant was interrogated with ultrasound. There is a complex multilocular fluid collection involving hepatic segment 4 B, 4 a and 5 surrounding the gallbladder. The gallbladder itself is distended, thick-walled and highly irregular. A suitable skin entry site was selected and marked. Local anesthesia was attained by infiltration with 1% lidocaine. A small dermatotomy was made. Under real-time sonographic guidance, a 21 gauge trocar needle was advanced through a short transhepatic course and into the lumen of the inflamed gallbladder. A gentle hand injection of contrast material opacifies the gallbladder lumen. Of note, the roof of the gallbladder along the hepatic surface is completely discontinuous and contrast material extravasated into the abscess collection. A 0.018 wire was advanced into the gallbladder lumen. The trocar needle was removed. The transitional sheath was advanced over the wire and into the gallbladder lumen. The microwire was exchanged for a short Amplatz wire. The transhepatic tract was then dilated to 10 [place] and a Cook 10.2 [place] all-purpose drainage catheter was advanced over the wire and formed in the gallbladder lumen. Aspiration was then performed. A total of 250 mL of thick, foul-smelling purulent fluid and bile was successfully aspirated. Ultrasound was again used to interrogate the liver. The previously identified complex hepatic abscess has been completely successfully aspirated. There is no significant residual fluid collection in which to place a second drainage catheter. Therefore, the transhepatic percutaneous cholecystostomy tube was gently flushed, connected to gravity bag drainage and secured to the skin with 0 Prolene suture. The patient tolerated procedure well. A sample of aspirated purulent fluid will be sent for Gram stain and culture. IMPRESSION: 1. Successful placement of a transhepatic percutaneous drainage catheter for acute calculus and gangrenous cholecystitis with  perforation and associated hepatic abscess. 2. The disrupted gallbladder lumen communicates freely with the intrahepatic abscess and all fluid was successfully aspirated through the percutaneous cholecystostomy tube. A second drainage catheter was not necessary. PLAN: 1. Maintain tube to gravity bag drainage. 2. Flush once per shift. 3. Follow-up in interventional radiology for cholecystostomy tube check and possible exchange in 6-8 weeks. Signed, Sterling Big, MD, RPVI [facility] Electronically Signed   By: Malachy Moan M.D.   On: 05/11/2018 10:39   Dg Chest Port 1 View  Result Date: 05/09/2018 CLINICAL DATA:  Cough. EXAM: PORTABLE CHEST 1 VIEW COMPARISON:  05/05/2018 and 04/10/2018 FINDINGS: The heart size and pulmonary vascularity are normal. CABG. Slight scarring in the left midzone and left base. The lungs are otherwise clear. No visible effusions. No discrete bone abnormality. IMPRESSION: No acute cardiopulmonary abnormalities. Electronically Signed   By: Francene Boyers M.D.   On: 05/09/2018 11:44    Time Spent in minutes  25   Dylin Ihnen M.D on 05/12/2018 at 1:15 PM  Between 7am to 7pm - Pager - [phone]  After 7pm go to [email or web address] - password [facility]  [facility] -  Office  [phone]

## 2018-05-12 NOTE — Progress Notes (Signed)
Patient ID: [name], male   DOB: 09/07/1942, 73 y.o.   MRN: [id number]       Subjective: Reports mild dull achy pain in RUQ. Sharp pain with deep inspiration. No output from drain over last 24 hours. Tolerating thins. Some mild nausea only after coughing. No emesis. Passing flatus. No BM. Using IS. Hasn't mobilized hallways yet.    Objective: Vital signs in last 24 hours: Temp:  [97.8 F (36.6 C)-99.2 F (37.3 C)] 98 F (36.7 C) (01/13 0417) Pulse Rate:  [80-104] 89 (01/13 0417) Resp:  [16-17] 16 (01/13 0417) BP: (126-133)/(64-79) 126/67 (01/13 0417) SpO2:  [95 %-97 %] 95 % (01/13 0417) Weight:  [113.3 kg] 113.3 kg (01/13 0500) Last BM Date: 05/09/18  Intake/Output from previous day: 01/12 0701 - 01/13 0700 In: 455 [P.O.:240; I.V.:5; IV Piggyback:200] Out: 0  Intake/Output this shift: No intake/output data recorded.  PE: Gen: alert and cooperative Heart: RRR Lungs: CTA b/l GI: Soft, non-distended. Mild tenderness in RUQ. + BS. No drainage in perc drain. Dressing intact and clean.   Lab Results:  Recent Labs    05/11/18 0257 05/12/18 0321  WBC 9.9 9.5  HGB 8.3* 8.3*  HCT 27.5* 27.2*  PLT 306 354   BMET Recent Labs    05/11/18 0257 05/12/18 0321  NA 131* 130*  K 3.8 4.1  CL 98 98  CO2 22 22  GLUCOSE 145* 151*  BUN 11 11  CREATININE 1.10 1.14  CALCIUM 8.6* 8.7*   PT/INR Recent Labs    05/10/18 0907  LABPROT 17.2*  INR 1.42   CMP     Component Value Date/Time   NA 130 (L) 05/12/2018 0321   NA 132 (L) 05/01/2018 0932   K 4.1 05/12/2018 0321   CL 98 05/12/2018 0321   CO2 22 05/12/2018 0321   GLUCOSE 151 (H) 05/12/2018 0321   BUN 11 05/12/2018 0321   BUN 13 05/01/2018 0932   CREATININE 1.14 05/12/2018 0321   CALCIUM 8.7 (L) 05/12/2018 0321   PROT 6.3 (L) 05/10/2018 0534   ALBUMIN 2.3 (L) 05/10/2018 0534   AST 43 (H) 05/10/2018 0534   ALT 64 (H) 05/10/2018 0534   ALKPHOS 186 (H) 05/10/2018 0534   BILITOT 0.5 05/10/2018 0534   GFRNONAA  >60 05/12/2018 0321   GFRAA >60 05/12/2018 0321   Lipase     Component Value Date/Time   LIPASE 42 05/09/2018 1108       Studies/Results: Dg Chest 2 View  Result Date: 05/11/2018 CLINICAL DATA:  Shortness of breath EXAM: CHEST - 2 VIEW COMPARISON:  05/09/2018 FINDINGS: Lungs are clear.  No pleural effusion or pneumothorax. The heart is top-normal in size. Postsurgical changes related to prior CABG. Mild degenerative changes of the visualized thoracolumbar spine. Median sternotomy. IMPRESSION: Normal chest radiographs. Electronically Signed   By: Charline Bills M.D.   On: 05/11/2018 21:36   Ir Perc Cholecystostomy  Result Date: 05/11/2018 INDICATION: 73 year old male with acute gangrenous cholecystitis and associated multilocular hepatic abscess. He presents for placement of a percutaneous cholecystostomy tube as well as potential percutaneous hepatic abscess drain placement. EXAM: CHOLECYSTOSTOMY MEDICATIONS: Patient is currently on intravenous antibiotics as an inpatient. No additional antibiotic coverage was required. ANESTHESIA/SEDATION: Moderate (conscious) sedation was employed during this procedure. A total of Versed 2 mg and Fentanyl 100 mcg was administered intravenously. Moderate Sedation Time: 13 minutes. The patient's level of consciousness and vital signs were monitored continuously by radiology nursing throughout the procedure under my direct supervision.  FLUOROSCOPY TIME:  Fluoroscopy Time: 0 minutes 54 seconds (8 mGy). COMPLICATIONS: None immediate. PROCEDURE: Informed written consent was obtained from the patient after a thorough discussion of the procedural risks, benefits and alternatives. All questions were addressed. Maximal Sterile Barrier Technique was utilized including caps, mask, sterile gowns, sterile gloves, sterile drape, hand hygiene and skin antiseptic. A timeout was performed prior to the initiation of the procedure. The right upper quadrant was interrogated with  ultrasound. There is a complex multilocular fluid collection involving hepatic segment 4 B, 4 a and 5 surrounding the gallbladder. The gallbladder itself is distended, thick-walled and highly irregular. A suitable skin entry site was selected and marked. Local anesthesia was attained by infiltration with 1% lidocaine. A small dermatotomy was made. Under real-time sonographic guidance, a 21 gauge trocar needle was advanced through a short transhepatic course and into the lumen of the inflamed gallbladder. A gentle hand injection of contrast material opacifies the gallbladder lumen. Of note, the roof of the gallbladder along the hepatic surface is completely discontinuous and contrast material extravasated into the abscess collection. A 0.018 wire was advanced into the gallbladder lumen. The trocar needle was removed. The transitional sheath was advanced over the wire and into the gallbladder lumen. The microwire was exchanged for a short Amplatz wire. The transhepatic tract was then dilated to 10 [place] and a Cook 10.2 [place] all-purpose drainage catheter was advanced over the wire and formed in the gallbladder lumen. Aspiration was then performed. A total of 250 mL of thick, foul-smelling purulent fluid and bile was successfully aspirated. Ultrasound was again used to interrogate the liver. The previously identified complex hepatic abscess has been completely successfully aspirated. There is no significant residual fluid collection in which to place a second drainage catheter. Therefore, the transhepatic percutaneous cholecystostomy tube was gently flushed, connected to gravity bag drainage and secured to the skin with 0 Prolene suture. The patient tolerated procedure well. A sample of aspirated purulent fluid will be sent for Gram stain and culture. IMPRESSION: 1. Successful placement of a transhepatic percutaneous drainage catheter for acute calculus and gangrenous cholecystitis with perforation and associated  hepatic abscess. 2. The disrupted gallbladder lumen communicates freely with the intrahepatic abscess and all fluid was successfully aspirated through the percutaneous cholecystostomy tube. A second drainage catheter was not necessary. PLAN: 1. Maintain tube to gravity bag drainage. 2. Flush once per shift. 3. Follow-up in interventional radiology for cholecystostomy tube check and possible exchange in 6-8 weeks. Signed, Sterling BigHeath K. McCullough, MD, RPVI [facility] Electronically Signed   By: Malachy MoanHeath  McCullough M.D.   On: 05/11/2018 10:39    Anti-infectives: Anti-infectives (From admission, onward)   Start     Dose/Rate Route Frequency Ordered Stop   05/09/18 2100  metroNIDAZOLE (FLAGYL) IVPB 500 mg     500 mg 100 mL/hr over 60 Minutes Intravenous  Once 05/09/18 2006 05/09/18 2255   05/09/18 1830  ceFEPIme (MAXIPIME) 1 g in sodium chloride 0.9 % 100 mL IVPB     1 g 200 mL/hr over 30 Minutes Intravenous Every 8 hours 05/09/18 1816     05/09/18 1800  metroNIDAZOLE (FLAGYL) IVPB 500 mg  Status:  Discontinued     500 mg 100 mL/hr over 60 Minutes Intravenous  Once 05/09/18 1758 05/09/18 2006       Assessment/Plan   Essential hypertension   Hyperlipidemia   Hx of CABG   Atrial flutter (HCC)   Anemia   Non-insulin dependent type  2 diabetes mellitus (HCC)   Protein-calorie malnutrition, severe  Acute diverticulitis -Question of this on CT -No pain except in RUQ -WBC normalized -No acute intervention needed at this time   Liver abscess ? Perf gallbladder -s/p Perc drain on 1/12, 250 cc output. Continue to monitor  -Cx pending. Mod wbx present, mod gram neg. Follow  -Continue abx -WBC normalized -CBC, CMP in the morning -Mobilize -IS -Advance diet  FEN - heart healthy VTE - Okay for chemical prophylaxis from surgical standpoint ID - Cefepime    LOS: 3 days    [name] , [facility] 05/12/2018,  11:13 AM Pager: [phone] Monday: 7am - 11:30am Tuesday - Friday: 7am - 4:30pm

## 2018-05-12 NOTE — Care Management Important Message (Signed)
Important Message  Patient Details  Name: [name] MRN: [id number] Date of Birth: [birth year]   Medicare Important Message Given:  Yes    Dorena Bodo 05/12/2018, 4:29 PM

## 2018-05-12 NOTE — Progress Notes (Signed)
Referring Physician(s): Dr Gonzella Lex  Supervising Physician: Jolaine Click  Patient Status:  [name] - In-pt  Chief Complaint:  1/12: Placement of a 65F percutaneous cholecystostomy tube.  A 2nd hepatic abscess drain was not necessary as the perforated GB communicated with the abscess and over 250 mL purulent fluid was successfully aspirated.  Subjective:   up in chair No pain until breathes deep No real OP per pt  I did flush drain-- flushes easily-- OP bloody purulent Wbc down  Allergies: Ciprofloxacin and Erythromycin ethylsuccinate  Medications: Prior to Admission medications   Medication Sig Start Date End Date Taking? Authorizing Provider  acetaminophen (TYLENOL) 500 MG tablet Take 1,000 mg by mouth daily as needed for moderate pain.   Yes [provider]  amiodarone (PACERONE) 200 MG tablet Take 1 tablet (200 mg total) by mouth daily. 04/14/18  Yes Doree Fudge M, PA-C  amLODipine (NORVASC) 5 MG tablet Take 5 mg by mouth daily. 02/05/18  Yes [provider]  aspirin EC 81 MG tablet Take 1 tablet (81 mg total) by mouth daily. 05/01/18  Yes Kilroy, Eda Paschal, PA-C  Cholecalciferol (VITAMIN D3) 2000 units TABS Take 2,000 Units by mouth 2 (two) times daily.    Yes [provider]  docusate sodium (COLACE) 100 MG capsule Take 100 mg by mouth 2 (two) times daily.    Yes [provider]  finasteride (PROSCAR) 5 MG tablet Take 5 mg by mouth daily. 02/05/18  Yes [provider]  furosemide (LASIX) 40 MG tablet Take 1 tablet (40 mg total) by mouth daily. 05/05/18  Yes Barrett, Erin R, PA-C  levalbuterol (XOPENEX) 0.31 MG/3ML nebulizer solution Take 1 ampule by nebulization every 6 (six) hours as needed (COUGH OR DYSPNEA).   Yes [provider]  loratadine (CLARITIN) 10 MG tablet Take 10 mg by mouth daily.   Yes [provider]  metFORMIN (GLUCOPHAGE) 1000 MG tablet Take 1,000 mg by mouth 2 (two) times daily. 02/05/18  Yes  [provider]  metoprolol tartrate (LOPRESSOR) 25 MG tablet Take 1 tablet (25 mg total) by mouth 2 (two) times daily. 04/14/18  Yes Doree Fudge M, PA-C  Multiple Vitamins-Minerals (MULTIVITAMIN ADULT PO) Take 1 tablet by mouth daily.   Yes [provider]  oxyCODONE (OXY IR/ROXICODONE) 5 MG immediate release tablet Take 5 mg by mouth every 4-6 hours PRN severe pain 04/14/18  Yes Doree Fudge M, PA-C  potassium chloride 20 MEQ TBCR Take 10 mEq by mouth daily. 05/05/18  Yes Barrett, Erin R, PA-C  pravastatin (PRAVACHOL) 40 MG tablet Take 40 mg by mouth at bedtime.  02/05/18  Yes [provider]  apixaban (ELIQUIS) 5 MG TABS tablet Take 1 tablet (5 mg total) by mouth 2 (two) times daily. 05/01/18   Abelino Derrick, PA-C  furosemide (LASIX) 40 MG tablet Take 1 tablet (40 mg total) by mouth daily for 5 days. Patient not taking: Reported on 05/11/2018 05/01/18 05/11/18  Abelino Derrick, PA-C  potassium chloride SA (K-DUR,KLOR-CON) 20 MEQ tablet Take 1 tablet (20 mEq total) by mouth daily for 5 days. Patient not taking: Reported on 05/05/2018 05/01/18 05/06/18  Abelino Derrick, PA-C     Vital Signs: BP 126/67 (BP Location: Right Arm)   Pulse 89   Temp 98 F (36.7 C) (Oral)   Resp 16   Ht 5\' 11"  (1.803 m)   Wt 249 lb 12.5 oz (113.3 kg)   SpO2 95%   BMI 34.84 kg/m  Physical Exam Vitals signs reviewed.  Skin:    General: Skin is warm and dry.     Comments: Site is clean and dry NT no bleeding OP bloody in bag; scant OP  Flushes easily-- aspirate is bloody and purulent  Cx pending  Neurological:     Mental Status: He is alert.     Imaging: Dg Chest 2 View  Result Date: 05/11/2018 CLINICAL DATA:  Shortness of breath EXAM: CHEST - 2 VIEW COMPARISON:  05/09/2018 FINDINGS: Lungs are clear.  No pleural effusion or pneumothorax. The heart is top-normal in size. Postsurgical changes related to prior CABG. Mild degenerative changes of the visualized thoracolumbar  spine. Median sternotomy. IMPRESSION: Normal chest radiographs. Electronically Signed   By: Charline BillsSriyesh  Krishnan M.D.   On: 05/11/2018 21:36   Ct Abdomen Pelvis W Contrast  Result Date: 05/09/2018 CLINICAL DATA:  Abdominal pain, acute, generalized. Decreased appetite with weakness and nausea. Recent fever. EXAM: CT ABDOMEN AND PELVIS WITH CONTRAST TECHNIQUE: Multidetector CT imaging of the abdomen and pelvis was performed using the standard protocol following bolus administration of intravenous contrast. CONTRAST:  100mL OMNIPAQUE IOHEXOL 300 MG/ML  SOLN COMPARISON:  CT abdomen pelvis 10/26/2016. FINDINGS: Lower chest: Lung bases are clear without focal nodule, mass, or airspace disease. Heart is mildly enlarged. Coronary artery calcifications are present. Patient is status post CABG. Hepatobiliary: There is diffuse fatty infiltration liver. New ill-defined multiloculated collection at the gallbladder fossa is concerning for complicated cholecystitis and hepatic abscess. Although there is no definite solid component. Neoplasm is not excluded. No significant intrahepatic biliary dilation or adenopathy is present. The common bile duct is otherwise normal. There are some inflammatory changes adjacent. Pancreas: Unremarkable. No pancreatic ductal dilatation or surrounding inflammatory changes. Spleen: Normal in size without focal abnormality. 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 The adrenal glands are otherwise normal. A 12 mm cyst is present in the midportion left kidney. An exophytic cyst at the lower pole of the left kidney measures 26 mm. Additional 12 mm parenchymal cyst is present near the lower pole of the left kidney is well. No focal lesions are present in the right kidney. There is no stone or hydronephrosis. Ureters are within normal limits bilaterally. The urinary bladder is normal. Stomach/Bowel: Stomach is within normal limits. Inflammatory changes are present  in the second portion the duodenum, adjacent to the gallbladder. Distal duodenum and small bowel are within normal limits. Surgery of the ascending colon is noted. Is some secondary inflammatory change at the hepatic flexure. Transverse colon is within normal limits. The distal descending colon demonstrates diverticular changes. Distal diverticular changes extend into the sigmoid colon or mild inflammatory changes are noted. There is no abscess or free air. Vascular/Lymphatic: Atherosclerotic calcifications are present in the aorta and branch vessels without aneurysm. No significant adenopathy is present. Reproductive: Prostate is unremarkable. Other: No abdominal wall hernia or abnormality. No abdominopelvic ascites. Musculoskeletal: Chronic endplate changes are most evident at L5-4 5 and to a lesser extent L3-4. Vertebral body heights are normal. Pelvis is unremarkable. Hips are located and within normal limits bilaterally. IMPRESSION: 1. Ill-defined multiloculated collection at the gallbladder fossa measures 12.5 x 10.2 x 7.0 cm. This most likely represents complicated cholecystitis and hepatic abscess. Secondary inflammatory changes are present in the duodenum and colon. Gallbladder neoplasm is considered less likely. 2. Descending and sigmoid colonic diverticular disease with mild inflammatory changes in the sigmoid consistent with early diverticulitis. This likely accounts for the  left lower quadrant pain. 3.  Aortic Atherosclerosis (ICD10-I70.0). 4. Multiple left renal cysts. Electronically Signed   By: Marin Roberts M.D.   On: 05/09/2018 16:54   Ir Perc Cholecystostomy  Result Date: 05/11/2018 INDICATION: 73 year old male with acute gangrenous cholecystitis and associated multilocular hepatic abscess. He presents for placement of a percutaneous cholecystostomy tube as well as potential percutaneous hepatic abscess drain placement. EXAM: CHOLECYSTOSTOMY MEDICATIONS: Patient is currently on  intravenous antibiotics as an inpatient. No additional antibiotic coverage was required. ANESTHESIA/SEDATION: Moderate (conscious) sedation was employed during this procedure. A total of Versed 2 mg and Fentanyl 100 mcg was administered intravenously. Moderate Sedation Time: 13 minutes. The patient's level of consciousness and vital signs were monitored continuously by radiology nursing throughout the procedure under my direct supervision. FLUOROSCOPY TIME:  Fluoroscopy Time: 0 minutes 54 seconds (8 mGy). COMPLICATIONS: None immediate. PROCEDURE: Informed written consent was obtained from the patient after a thorough discussion of the procedural risks, benefits and alternatives. All questions were addressed. Maximal Sterile Barrier Technique was utilized including caps, mask, sterile gowns, sterile gloves, sterile drape, hand hygiene and skin antiseptic. A timeout was performed prior to the initiation of the procedure. The right upper quadrant was interrogated with ultrasound. There is a complex multilocular fluid collection involving hepatic segment 4 B, 4 a and 5 surrounding the gallbladder. The gallbladder itself is distended, thick-walled and highly irregular. A suitable skin entry site was selected and marked. Local anesthesia was attained by infiltration with 1% lidocaine. A small dermatotomy was made. Under real-time sonographic guidance, a 21 gauge trocar needle was advanced through a short transhepatic course and into the lumen of the inflamed gallbladder. A gentle hand injection of contrast material opacifies the gallbladder lumen. Of note, the roof of the gallbladder along the hepatic surface is completely discontinuous and contrast material extravasated into the abscess collection. A 0.018 wire was advanced into the gallbladder lumen. The trocar needle was removed. The transitional sheath was advanced over the wire and into the gallbladder lumen. The microwire was exchanged for a short Amplatz wire. The  transhepatic tract was then dilated to 10 [place] and a Cook 10.2 [place] all-purpose drainage catheter was advanced over the wire and formed in the gallbladder lumen. Aspiration was then performed. A total of 250 mL of thick, foul-smelling purulent fluid and bile was successfully aspirated. Ultrasound was again used to interrogate the liver. The previously identified complex hepatic abscess has been completely successfully aspirated. There is no significant residual fluid collection in which to place a second drainage catheter. Therefore, the transhepatic percutaneous cholecystostomy tube was gently flushed, connected to gravity bag drainage and secured to the skin with 0 Prolene suture. The patient tolerated procedure well. A sample of aspirated purulent fluid will be sent for Gram stain and culture. IMPRESSION: 1. Successful placement of a transhepatic percutaneous drainage catheter for acute calculus and gangrenous cholecystitis with perforation and associated hepatic abscess. 2. The disrupted gallbladder lumen communicates freely with the intrahepatic abscess and all fluid was successfully aspirated through the percutaneous cholecystostomy tube. A second drainage catheter was not necessary. PLAN: 1. Maintain tube to gravity bag drainage. 2. Flush once per shift. 3. Follow-up in interventional radiology for cholecystostomy tube check and possible exchange in 6-8 weeks. Signed, Sterling Big, MD, RPVI [facility] Electronically Signed   By: Malachy Moan M.D.   On: 05/11/2018 10:39   Dg Chest Port 1 View  Result Date: 05/09/2018 CLINICAL DATA:  Cough.  EXAM: PORTABLE CHEST 1 VIEW COMPARISON:  05/05/2018 and 04/10/2018 FINDINGS: The heart size and pulmonary vascularity are normal. CABG. Slight scarring in the left midzone and left base. The lungs are otherwise clear. No visible effusions. No discrete bone abnormality. IMPRESSION: No acute  cardiopulmonary abnormalities. Electronically Signed   By: Francene BoyersJames  Maxwell M.D.   On: 05/09/2018 11:44    Labs:  CBC: Recent Labs    05/09/18 2047 05/10/18 0534 05/11/18 0257 05/12/18 0321  WBC 10.8* 10.9* 9.9 9.5  HGB 9.6* 9.0* 8.3* 8.3*  HCT 30.8* 29.4* 27.5* 27.2*  PLT 341 319 306 354    COAGS: Recent Labs    03/14/18 1610 03/26/18 1146 03/31/18 1458 04/08/18 0238 05/10/18 0907  INR 0.96 0.96 1.37 1.06 1.42  APTT 32 31 29  --   --     BMP: Recent Labs    05/09/18 1108 05/09/18 2047 05/10/18 0534 05/11/18 0257 05/12/18 0321  NA 130*  --  132* 131* 130*  K 4.3  --  4.0 3.8 4.1  CL 95*  --  97* 98 98  CO2 22  --  21* 22 22  GLUCOSE 149*  --  143* 145* 151*  BUN 20  --  16 11 11   CALCIUM 9.2  --  9.0 8.6* 8.7*  CREATININE 1.46* 1.37* 1.19 1.10 1.14  GFRNONAA 46* 50* 59* >60 >60  GFRAA 53* 58* >60 >60 >60    LIVER FUNCTION TESTS: Recent Labs    04/06/18 0322 04/07/18 0332 05/09/18 1108 05/10/18 0534  BILITOT 0.7 1.3* 0.9 0.5  AST 21 30 82* 43*  ALT 24 30 86* 64*  ALKPHOS 38 37* 228* 186*  PROT 5.2* 5.1* 6.6 6.3*  ALBUMIN 2.5* 2.5* 2.4* 2.3*    Assessment and Plan:  Perc chole Drain placed in IR yesterday Scant OP Will be sure flushing Wbc down Will follow  Electronically Signed: Robet LeuPamela A Tenae Graziosi, PA-C 05/12/2018, 11:23 AM   I spent a total of 15 Minutes at the the patient's bedside AND on the patient's Price floor or unit, greater than 50% of which was counseling/coordinating care for perc chole tube drain

## 2018-05-12 NOTE — Plan of Care (Signed)
  Problem: Clinical Measurements: Goal: Ability to maintain clinical measurements within normal limits will improve Outcome: Progressing   

## 2018-05-12 NOTE — Discharge Instructions (Addendum)
It was our pleasure to provide your ER care today - we hope that you feel better.  Rest. Drink plenty of fluids.  Your liver function tests are elevated (AST 82/ALT 86) - follow up with your doctor in the coming week - discuss levels, possibly repeating, and/or possible adjustment in your medications.  Return to ER if worse, new symptoms, fevers, new or severe pain, persistent vomiting, trouble breathing, other concern.    Information on my medicine - ELIQUIS (apixaban)  Why was Eliquis prescribed for you? Eliquis was prescribed for you to reduce the risk of a blood clot forming that can cause a stroke if you have a medical condition called atrial fibrillation (a type of irregular heartbeat).  What do You need to know about Eliquis ? Take your Eliquis TWICE DAILY - one tablet in the morning and one tablet in the evening with or without food. If you have difficulty swallowing the tablet whole please discuss with your pharmacist how to take the medication safely.  Take Eliquis exactly as prescribed by your doctor and DO NOT stop taking Eliquis without talking to the doctor who prescribed the medication.  Stopping may increase your risk of developing a stroke.  Refill your prescription before you run out.  After discharge, you should have regular check-up appointments with your healthcare provider that is prescribing your Eliquis.  In the future your dose may need to be changed if your kidney function or weight changes by a significant amount or as you get older.  What do you do if you miss a dose? If you miss a dose, take it as soon as you remember on the same day and resume taking twice daily.  Do not take more than one dose of ELIQUIS at the same time to make up a missed dose.  Important Safety Information A possible side effect of Eliquis is bleeding. You should call your healthcare provider right away if you experience any of the following: ? Bleeding from an injury or your nose  that does not stop. ? Unusual colored urine (red or dark brown) or unusual colored stools (red or black). ? Unusual bruising for unknown reasons. ? A serious fall or if you hit your head (even if there is no bleeding).  Some medicines may interact with Eliquis and might increase your risk of bleeding or clotting while on Eliquis. To help avoid this, consult your healthcare provider or pharmacist prior to using any new prescription or non-prescription medications, including herbals, vitamins, non-steroidal anti-inflammatory drugs (NSAIDs) and supplements.  This website has more information on Eliquis (apixaban): http://www.eliquis.com/eliquis/home  Nutrition Post Hospital Stay Proper nutrition can help your body recover from illness and injury.   Foods and beverages high in protein, vitamins, and minerals help rebuild muscle loss, promote healing, & reduce fall risk.   In addition to eating healthy foods, a nutrition shake is an easy, delicious way to get the nutrition you need during and after your hospital stay  It is recommended that you continue to drink 2 bottles per day of:       Glucerna or Premier Protein for at least 1 month (30 days) after your hospital stay   Tips for adding a nutrition shake into your routine: As allowed, drink one with vitamins or medications instead of water or juice Enjoy one as a tasty mid-morning or afternoon snack Drink cold or make a milkshake out of it Drink one instead of milk with cereal or snacks Use as a coffee  creamer   Available at the following grocery stores and pharmacies:           * [employer] * [employer] * [employer]  * R[employer] Aid          * Walmart * [employer] Club  * Walgreens      * Target  * BJ's   * CVS  * [employer]   * [facility] [phone]            For COUPONS visit: www.ensure.com/join or [email or web address].brwww.boost.com/members/sign-[email or web address]   Suggested Substitutions Ensure Plus = Boost Plus = Carnation Breakfast Essentials =  Boost Compact Ensure Active Clear = Boost Breeze Glucerna Shake = Boost Glucose Control = Carnation Breakfast Essentials SUGAR FREE

## 2018-05-13 ENCOUNTER — Inpatient Hospital Stay: Payer: Self-pay

## 2018-05-13 DIAGNOSIS — E43 Unspecified severe protein-calorie malnutrition: Secondary | ICD-10-CM

## 2018-05-13 LAB — COMPREHENSIVE METABOLIC PANEL
ALK PHOS: 181 U/L — AB (ref 38–126)
ALT: 46 U/L — ABNORMAL HIGH (ref 0–44)
AST: 37 U/L (ref 15–41)
Albumin: 2.1 g/dL — ABNORMAL LOW (ref 3.5–5.0)
Anion gap: 11 (ref 5–15)
BUN: 13 mg/dL (ref 8–23)
CO2: 21 mmol/L — AB (ref 22–32)
Calcium: 8.9 mg/dL (ref 8.9–10.3)
Chloride: 98 mmol/L (ref 98–111)
Creatinine, Ser: 1.37 mg/dL — ABNORMAL HIGH (ref 0.61–1.24)
GFR calc Af Amer: 58 mL/min — ABNORMAL LOW (ref >60)
GFR calc non Af Amer: 50 mL/min — ABNORMAL LOW (ref >60)
Glucose, Bld: 157 mg/dL — ABNORMAL HIGH (ref 70–99)
Potassium: 4.1 mmol/L (ref 3.5–5.1)
Sodium: 130 mmol/L — ABNORMAL LOW (ref 135–145)
Total Bilirubin: 0.5 mg/dL (ref 0.3–1.2)
Total Protein: 5.9 g/dL — ABNORMAL LOW (ref 6.5–8.1)

## 2018-05-13 LAB — GLUCOSE, CAPILLARY
Glucose-Capillary: 155 mg/dL — ABNORMAL HIGH (ref 70–99)
Glucose-Capillary: 125 mg/dL — ABNORMAL HIGH (ref 70–99)
Glucose-Capillary: 145 mg/dL — ABNORMAL HIGH (ref 70–99)
Glucose-Capillary: 157 mg/dL — ABNORMAL HIGH (ref 70–99)
Glucose-Capillary: 170 mg/dL — ABNORMAL HIGH (ref 70–99)

## 2018-05-13 LAB — CBC
HCT: 26.8 % — ABNORMAL LOW (ref 39.0–52.0)
HEMOGLOBIN: 8.1 g/dL — AB (ref 13.0–17.0)
MCH: 26 pg (ref 26.0–34.0)
MCHC: 30.2 g/dL (ref 30.0–36.0)
MCV: 85.9 fL (ref 80.0–100.0)
Platelets: 333 K/uL (ref 150–400)
RBC: 3.12 MIL/uL — ABNORMAL LOW (ref 4.22–5.81)
RDW: 17.2 % — ABNORMAL HIGH (ref 11.5–15.5)
WBC: 8.4 K/uL (ref 4.0–10.5)
nRBC: 0 % (ref 0.0–0.2)

## 2018-05-13 MED ORDER — SODIUM CHLORIDE 0.9 % IV SOLN
INTRAVENOUS | Status: DC
Start: 2018-05-13 — End: 2018-05-14
  Administered 2018-05-13: 09:00:00 via INTRAVENOUS

## 2018-05-13 MED ORDER — FERROUS SULFATE 325 (65 FE) MG PO TABS
325.0000 mg | Freq: Two times a day (BID) | ORAL | Status: DC
Start: 2018-05-13 — End: 2018-05-15
  Administered 2018-05-13 – 2018-05-15 (×4): 325 mg via ORAL
  Filled 2018-05-13 (×4): qty 1

## 2018-05-13 NOTE — Progress Notes (Signed)
Referring Physician(s): Dr. Velta Addison. Dhungel   Supervising Physician: Oley Balm  Patient Status:  [facility] - In-pt  Chief Complaint: Follow up percutaneous cholecystostomy placed 05/11/18 by Dr. Archer Asa  Subjective:  Patient sitting in recliner awaiting PICC team arrival, looking forward to going home. States he will have HH services to care for drain/PICC line and he has previous experience with PICC line. He does report some pain with deep inspiration, otherwise rates pain 3/10 - mostly sore at insertion site. He states large volume in bag yesterday, continuous to be bloody in appearance. Appetite good, no nausea or vomiting.   Allergies: Ciprofloxacin and Erythromycin ethylsuccinate  Medications: Prior to Admission medications   Medication Sig Start Date End Date Taking? Authorizing Provider  acetaminophen (TYLENOL) 500 MG tablet Take 1,000 mg by mouth daily as needed for moderate pain.   Yes [provider]  amiodarone (PACERONE) 200 MG tablet Take 1 tablet (200 mg total) by mouth daily. 04/14/18  Yes Doree Fudge M, PA-C  amLODipine (NORVASC) 5 MG tablet Take 5 mg by mouth daily. 02/05/18  Yes [provider]  aspirin EC 81 MG tablet Take 1 tablet (81 mg total) by mouth daily. 05/01/18  Yes Kilroy, Eda Paschal, PA-C  Cholecalciferol (VITAMIN D3) 2000 units TABS Take 2,000 Units by mouth 2 (two) times daily.    Yes [provider]  docusate sodium (COLACE) 100 MG capsule Take 100 mg by mouth 2 (two) times daily.    Yes [provider]  finasteride (PROSCAR) 5 MG tablet Take 5 mg by mouth daily. 02/05/18  Yes [provider]  furosemide (LASIX) 40 MG tablet Take 1 tablet (40 mg total) by mouth daily. 05/05/18  Yes Barrett, Erin R, PA-C  levalbuterol (XOPENEX) 0.31 MG/3ML nebulizer solution Take 1 ampule by nebulization every 6 (six) hours as needed (COUGH OR DYSPNEA).   Yes [provider]  loratadine (CLARITIN) 10 MG tablet  Take 10 mg by mouth daily.   Yes [provider]  metFORMIN (GLUCOPHAGE) 1000 MG tablet Take 1,000 mg by mouth 2 (two) times daily. 02/05/18  Yes [provider]  metoprolol tartrate (LOPRESSOR) 25 MG tablet Take 1 tablet (25 mg total) by mouth 2 (two) times daily. 04/14/18  Yes Doree Fudge M, PA-C  Multiple Vitamins-Minerals (MULTIVITAMIN ADULT PO) Take 1 tablet by mouth daily.   Yes [provider]  oxyCODONE (OXY IR/ROXICODONE) 5 MG immediate release tablet Take 5 mg by mouth every 4-6 hours PRN severe pain 04/14/18  Yes Doree Fudge M, PA-C  potassium chloride 20 MEQ TBCR Take 10 mEq by mouth daily. 05/05/18  Yes Barrett, Erin R, PA-C  pravastatin (PRAVACHOL) 40 MG tablet Take 40 mg by mouth at bedtime.  02/05/18  Yes [provider]  apixaban (ELIQUIS) 5 MG TABS tablet Take 1 tablet (5 mg total) by mouth 2 (two) times daily. 05/01/18   Abelino Derrick, PA-C  furosemide (LASIX) 40 MG tablet Take 1 tablet (40 mg total) by mouth daily for 5 days. Patient not taking: Reported on 05/11/2018 05/01/18 05/11/18  Abelino Derrick, PA-C  potassium chloride SA (K-DUR,KLOR-CON) 20 MEQ tablet Take 1 tablet (20 mEq total) by mouth daily for 5 days. Patient not taking: Reported on 05/05/2018 05/01/18 05/06/18  Abelino Derrick, PA-C     Vital Signs: BP 131/65 (BP Location: Right Arm)   Pulse (!) 56   Temp 98 F (36.7 C) (Oral)   Resp 18   Ht 5\' 11"  (  1.803 m)   Wt 250 lb 7.1 oz (113.6 kg)   SpO2 96%   BMI 34.93 kg/m   Physical Exam Vitals signs reviewed.  Cardiovascular:     Rate and Rhythm: Normal rate.  Pulmonary:     Effort: Pulmonary effort is normal.  Abdominal:     Palpations: Abdomen is soft.     Tenderness: There is abdominal tenderness (Some tenderness at drain insertion site. Dressing clean, dry, in tact. ).     Comments: (+) RUQ drain to gravity; ~20 cc serosanguineous OP present. Flushes easily.   Skin:    General: Skin is warm and dry.    Neurological:     Mental Status: He is alert. Mental status is at baseline.     Imaging: Dg Chest 2 View  Result Date: 05/11/2018 CLINICAL DATA:  Shortness of breath EXAM: CHEST - 2 VIEW COMPARISON:  05/09/2018 FINDINGS: Lungs are clear.  No pleural effusion or pneumothorax. The heart is top-normal in size. Postsurgical changes related to prior CABG. Mild degenerative changes of the visualized thoracolumbar spine. Median sternotomy. IMPRESSION: Normal chest radiographs. Electronically Signed   By: Charline Bills M.D.   On: 05/11/2018 21:36   Ct Abdomen Pelvis W Contrast  Result Date: 05/09/2018 CLINICAL DATA:  Abdominal pain, acute, generalized. Decreased appetite with weakness and nausea. Recent fever. EXAM: CT ABDOMEN AND PELVIS WITH CONTRAST TECHNIQUE: Multidetector CT imaging of the abdomen and pelvis was performed using the standard protocol following bolus administration of intravenous contrast. CONTRAST:  OMNIPAQUE IOHEXOL 300 MG/ML  SOLN COMPARISON:  CT abdomen pelvis 10/26/2016. FINDINGS: Lower chest: Lung bases are clear without focal nodule, mass, or airspace disease. Heart is mildly enlarged. Coronary artery calcifications are present. Patient is status post CABG. Hepatobiliary: There is diffuse fatty infiltration liver. New ill-defined multiloculated collection at the gallbladder fossa is concerning for complicated cholecystitis and hepatic abscess. Although there is no definite solid component. Neoplasm is not excluded. No significant intrahepatic biliary dilation or adenopathy is present. The common bile duct is otherwise normal. There are some inflammatory changes adjacent. Pancreas: Unremarkable. No pancreatic ductal dilatation or surrounding inflammatory changes. Spleen: Normal in size without focal abnormality. 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 The adrenal glands are otherwise normal. A 12 mm cyst is present in the midportion  left kidney. An exophytic cyst at the lower pole of the left kidney measures 26 mm. Additional 12 mm parenchymal cyst is present near the lower pole of the left kidney is well. No focal lesions are present in the right kidney. There is no stone or hydronephrosis. Ureters are within normal limits bilaterally. The urinary bladder is normal. Stomach/Bowel: Stomach is within normal limits. Inflammatory changes are present in the second portion the duodenum, adjacent to the gallbladder. Distal duodenum and small bowel are within normal limits. Surgery of the ascending colon is noted. Is some secondary inflammatory change at the hepatic flexure. Transverse colon is within normal limits. The distal descending colon demonstrates diverticular changes. Distal diverticular changes extend into the sigmoid colon or mild inflammatory changes are noted. There is no abscess or free air. Vascular/Lymphatic: Atherosclerotic calcifications are present in the aorta and branch vessels without aneurysm. No significant adenopathy is present. Reproductive: Prostate is unremarkable. Other: No abdominal wall hernia or abnormality. No abdominopelvic ascites. Musculoskeletal: Chronic endplate changes are most evident at L5-4 5 and to a lesser extent L3-4. Vertebral body heights are normal. Pelvis is unremarkable. Hips are located  and within normal limits bilaterally. IMPRESSION: 1. Ill-defined multiloculated collection at the gallbladder fossa measures 12.5 x 10.2 x 7.0 cm. This most likely represents complicated cholecystitis and hepatic abscess. Secondary inflammatory changes are present in the duodenum and colon. Gallbladder neoplasm is considered less likely. 2. Descending and sigmoid colonic diverticular disease with mild inflammatory changes in the sigmoid consistent with early diverticulitis. This likely accounts for the left lower quadrant pain. 3.  Aortic Atherosclerosis (ICD10-I70.0). 4. Multiple left renal cysts. Electronically  Signed   By: Marin Robertshristopher  Mattern M.D.   On: 05/09/2018 16:54   Ir Perc Cholecystostomy  Result Date: 05/11/2018 INDICATION: 73 year old male with acute gangrenous cholecystitis and associated multilocular hepatic abscess. He presents for placement of a percutaneous cholecystostomy tube as well as potential percutaneous hepatic abscess drain placement. EXAM: CHOLECYSTOSTOMY MEDICATIONS: Patient is currently on intravenous antibiotics as an inpatient. No additional antibiotic coverage was required. ANESTHESIA/SEDATION: Moderate (conscious) sedation was employed during this procedure. A total of Versed 2 mg and Fentanyl 100 mcg was administered intravenously. Moderate Sedation Time: 13 minutes. The patient's level of consciousness and vital signs were monitored continuously by radiology nursing throughout the procedure under my direct supervision. FLUOROSCOPY TIME:  Fluoroscopy Time: 0 minutes 54 seconds (8 mGy). COMPLICATIONS: None immediate. PROCEDURE: Informed written consent was obtained from the patient after a thorough discussion of the procedural risks, benefits and alternatives. All questions were addressed. Maximal Sterile Barrier Technique was utilized including caps, mask, sterile gowns, sterile gloves, sterile drape, hand hygiene and skin antiseptic. A timeout was performed prior to the initiation of the procedure. The right upper quadrant was interrogated with ultrasound. There is a complex multilocular fluid collection involving hepatic segment 4 B, 4 a and 5 surrounding the gallbladder. The gallbladder itself is distended, thick-walled and highly irregular. A suitable skin entry site was selected and marked. Local anesthesia was attained by infiltration with 1% lidocaine. A small dermatotomy was made. Under real-time sonographic guidance, a 21 gauge trocar needle was advanced through a short transhepatic course and into the lumen of the inflamed gallbladder. A gentle hand injection of contrast  material opacifies the gallbladder lumen. Of note, the roof of the gallbladder along the hepatic surface is completely discontinuous and contrast material extravasated into the abscess collection. A 0.018 wire was advanced into the gallbladder lumen. The trocar needle was removed. The transitional sheath was advanced over the wire and into the gallbladder lumen. The microwire was exchanged for a short Amplatz wire. The transhepatic tract was then dilated to 10 [place] and a Cook 10.2 [place] all-purpose drainage catheter was advanced over the wire and formed in the gallbladder lumen. Aspiration was then performed. A total of 250 mL of thick, foul-smelling purulent fluid and bile was successfully aspirated. Ultrasound was again used to interrogate the liver. The previously identified complex hepatic abscess has been completely successfully aspirated. There is no significant residual fluid collection in which to place a second drainage catheter. Therefore, the transhepatic percutaneous cholecystostomy tube was gently flushed, connected to gravity bag drainage and secured to the skin with 0 Prolene suture. The patient tolerated procedure well. A sample of aspirated purulent fluid will be sent for Gram stain and culture. IMPRESSION: 1. Successful placement of a transhepatic percutaneous drainage catheter for acute calculus and gangrenous cholecystitis with perforation and associated hepatic abscess. 2. The disrupted gallbladder lumen communicates freely with the intrahepatic abscess and all fluid was successfully aspirated through the percutaneous cholecystostomy tube. A second drainage catheter was not necessary. PLAN:  1. Maintain tube to gravity bag drainage. 2. Flush once per shift. 3. Follow-up in interventional radiology for cholecystostomy tube check and possible exchange in 6-8 weeks. Signed, Sterling BigHeath K. McCullough, MD, RPVI [facility] Electronically Signed    By: Malachy MoanHeath  McCullough M.D.   On: 05/11/2018 10:39   [place] Ekg Site Rite  Result Date: 05/13/2018 If Site Rite image not attached, placement could not be confirmed due to current cardiac rhythm.   Labs:  CBC: Recent Labs    05/10/18 0534 05/11/18 0257 05/12/18 0321 05/13/18 0243  WBC 10.9* 9.9 9.5 8.4  HGB 9.0* 8.3* 8.3* 8.1*  HCT 29.4* 27.5* 27.2* 26.8*  PLT 319 306 354 333    COAGS: Recent Labs    03/14/18 1610 03/26/18 1146 03/31/18 1458 04/08/18 0238 05/10/18 0907  INR 0.96 0.96 1.37 1.06 1.42  APTT 32 31 29  --   --     BMP: Recent Labs    05/10/18 0534 05/11/18 0257 05/12/18 0321 05/13/18 0243  NA 132* 131* 130* 130*  K 4.0 3.8 4.1 4.1  CL 97* 98 98 98  CO2 21* 22 22 21*  GLUCOSE 143* 145* 151* 157*  BUN 16 11 11 13   CALCIUM 9.0 8.6* 8.7* 8.9  CREATININE 1.19 1.10 1.14 1.37*  GFRNONAA 59* >60 >60 50*  GFRAA >60 >60 >60 58*    LIVER FUNCTION TESTS: Recent Labs    04/07/18 0332 05/09/18 1108 05/10/18 0534 05/13/18 0243  BILITOT 1.3* 0.9 0.5 0.5  AST 30 82* 43* 37  ALT 30 86* 64* 46*  ALKPHOS 37* 228* 186* 181*  PROT 5.1* 6.6 6.3* 5.9*  ALBUMIN 2.5* 2.4* 2.3* 2.1*    Assessment and Plan:  S/p percutaneous cholecystostomy placed 05/11/18 by Dr. Archer AsaMcCullough. Per chart planned for d/c to home with PICC line for abx.  IR scheduler will call patient for appointment - plan to be seen in approximately 6 weeks for drain evaluation/CT. Continue QD flushes with 5 cc NS, record output daily. Orange drain card and rx for flushes placed in front of patient's chart. Discussed flushing and recording drain OP with patient today who states understanding. Encouraged to call with questions or concerns.  IR will continue to follow while inpatient. Please call with questions or concerns.  Electronically Signed: Villa HerbShannon A Simmone Cape, PA-C 05/13/2018, 3:57 PM   I spent a total of 25 Minutes at the the patient's bedside AND on the patient's hospital floor or unit,  greater than 50% of which was counseling/coordinating care for follow up percutaneous cholecystostomy.

## 2018-05-13 NOTE — Care Management Note (Addendum)
Case Management Note  Patient Details  Name: [name] MRN: [id number] Date of Birth: [birth year]  Subjective/Objective:                    Action/Plan:Patient will need a prescription for drain flushes, and home health, paged attending  Spoke to patient at bedside and daughter [name] was on speaker home.   Confirmed face sheet information with both. Patient prefers cell phone number to be primary number.   Patient from home alone. He has a son and daughter both live 2 hours away , but take turns staying with their dad when needed. Mary plans to come pick patient up from hospital and assist at home.   Patient has been at home before on IV ABX and both are  is aware he and family  will be taught how to administer antibiotics.  Patient currently active with [facility] for PT. Patient and daughter would like to change home health agencies. Encouraged patient and [name] to call [facility] to address their concerns.   Explained they will need Infusion company and home health agency. They  Understand since they have been through home IV ABX before. Provided Medicare.gov list. They both want [facility] for infusion and home health.   [name] with [facility] and notified her. Referral given to [facility] with [facility]. She will come see patient today at 1400. [name] will call [name] from to schedule a time to met with her also. Patient aware.  Expected Discharge Date:                  Expected Discharge Plan:  [place]  In-House Referral:     Discharge planning Services  CM Consult  Post Acute Care Choice:  Home Health Choice offered to:  Patient, Adult Children  DME Arranged:  N/A DME Agency:  NA  HH Arranged:  RN, PT [place] Agency:  [facility]  Status of Service:  In process, will continue to follow  If discussed at Long Length of Stay Meetings, dates discussed:    Additional Comments:  Calvin Favre, RN 05/13/2018, 1:29 PM

## 2018-05-13 NOTE — Progress Notes (Signed)
[facility]  Date of Admission:  05/09/2018                                   Total days of antibiotics 5                                                                                     Day 3 Cefepime                                                                                     Day 2 Metronidazole  Assessment: [name] is a 73 year old male with diabetes, recent hospitalization for CABG x4, and prior liver absess who is admitted with a loculated liver abscess and acute gangrenous cholecystitis, now status post cholecystostomy tube placement. Abscess cultures have grown multiple organisms; lab contacted and will work on speciation. 200cc of serosanguinous drainage in past 24 hours. Patient may require repeat imaging to evaluate for persistent abscess given loculations. Continue Cefepime and metronidazole while we await culture speciation. Patient rarely drinks and will be able to abstain while on metronidazole. Expect at least 4 weeks total duration of antibiotics.  Plan:  1. Liver Abscess secondary to Acute Gangrenous Cholecystitis - Continue Cefepime and PO Metronidazole - Place PICC line - Await Culture Speciation - Monitor drain output - Encourage mobilization  Principal Problem:   Liver abscess Active Problems:   Acute gangrenous cholecystitis   Non-insulin dependent type 2 diabetes mellitus (HCC)   Essential hypertension   Hyperlipidemia   Hx of CABG   Atrial flutter (HCC)   Anemia   Protein-calorie malnutrition, severe   Scheduled Meds: . amiodarone  200 mg Oral Daily  . apixaban  5 mg Oral BID  . docusate sodium  100 mg Oral BID  . feeding supplement  1 Container Oral TID BM  . finasteride  5 mg Oral Daily  . furosemide  40 mg Oral Daily  . insulin aspart  0-15 Units Subcutaneous TID WC  . insulin aspart  0-5 Units Subcutaneous QHS  . loratadine  10 mg Oral Daily  . metoprolol tartrate  25 mg Oral BID  . metroNIDAZOLE   500 mg Oral Q8H  . sodium chloride flush  3 mL Intravenous Q12H  . sodium chloride flush  5 mL Intracatheter Q8H   Continuous Infusions: . sodium chloride 75 mL/hr at 05/13/18 0841  . ceFEPime (MAXIPIME) IV 1 g (05/13/18 0205)   PRN Meds:.acetaminophen **OR** acetaminophen, HYDROmorphone (DILAUDID) injection, menthol-cetylpyridinium, ondansetron **OR** ondansetron (ZOFRAN) IV, oxyCODONE, polyethylene glycol, traZODone   SUBJECTIVE: [name] state he is feeling okay today. His pain remains about the same (dull at baseline with sharp pain on deep inspiration). He states he  has not had a bowl movement yet but is passing gas a tolerating his diet.  Review of Systems: Review of Systems  Constitutional: Negative for chills and fever.  Gastrointestinal: Negative for nausea.    Allergies  Allergen Reactions  . Ciprofloxacin Rash  . Erythromycin Ethylsuccinate Rash    OBJECTIVE: Vitals:   05/13/18 0500 05/13/18 0830 05/13/18 1000 05/13/18 1035  BP:  (!) 134/59 (!) 110/53 (!) 117/54  Pulse:  79 63 62  Resp:      Temp:  98 F (36.7 C)    TempSrc:  Oral    SpO2:  94%    Weight: 113.6 kg     Height:       Body mass index is 34.93 kg/m.  Physical Exam Constitutional:      General: He is not in acute distress. Cardiovascular:     Rate and Rhythm: Normal rate and regular rhythm.     Heart sounds: Normal heart sounds.  Pulmonary:     Effort: Pulmonary effort is normal. No respiratory distress.     Breath sounds: Normal breath sounds.  Abdominal:     General: Bowel sounds are normal. There is no distension.     Palpations: Abdomen is soft.     Comments: Cholecystomy drain with serosanguinous drainage Tender to palpation of RUQ  Musculoskeletal:        General: No swelling or tenderness.     Right lower leg: No edema.     Left lower leg: No edema.  Skin:    General: Skin is warm and dry.  Neurological:     General: No focal deficit present.     Mental Status: He is  alert. Mental status is at baseline.    Lab Results Lab Results  Component Value Date   WBC 8.4 05/13/2018   HGB 8.1 (L) 05/13/2018   HCT 26.8 (L) 05/13/2018   MCV 85.9 05/13/2018   PLT 333 05/13/2018    Lab Results  Component Value Date   CREATININE 1.37 (H) 05/13/2018   BUN 13 05/13/2018   NA 130 (L) 05/13/2018   K 4.1 05/13/2018   CL 98 05/13/2018   CO2 21 (L) 05/13/2018    Lab Results  Component Value Date   ALT 46 (H) 05/13/2018   AST 37 05/13/2018   ALKPHOS 181 (H) 05/13/2018   BILITOT 0.5 05/13/2018     Microbiology: Recent Results (from the past 240 hour(s))  Aerobic/Anaerobic Culture (surgical/deep wound)     Status: Abnormal (Preliminary result)   Collection Time: 05/11/18 10:28 AM  Result Value Ref Range Status   Specimen Description ABSCESS LIVER  Final   Special Requests Normal  Final   Gram Stain   Final    MODERATE WBC PRESENT, PREDOMINANTLY PMN MODERATE GRAM NEGATIVE RODS Performed at [facility], [address] [place], [place] [address]    Culture (A)  Final    MULTIPLE ORGANISMS PRESENT, NONE PREDOMINANT NO ANAEROBES ISOLATED; CULTURE IN PROGRESS FOR 5 DAYS    Report Status PENDING  Incomplete    Beola CordAlexander , MD IM PGY-2 [phone] pager   [phone] cell 05/13/2018, 10:50 AM

## 2018-05-13 NOTE — Progress Notes (Addendum)
PROGRESS NOTE                                                                                                                                                                                                             Patient Demographics:    [name], is a 73 y.o. male, DOB - 1942-06-05, WGN:[id number]  Admit date - 05/09/2018   Admitting Physician Lahoma Crockerheresa C Sheehan, MD  Outpatient Primary MD for the patient is Karle PlumberArvind, Moogali M, MD  LOS - 4  Outpatient Specialists: Cardiology  Chief Complaint  Patient presents with  . Nausea  . Weakness       Brief Narrative 73 year old male with history of CAD, recent CABG, a flutter (recent) on Eliquis, type 2 diabetes mellitus, severe protein calorie malnutrition, hyperlipidemia with prior history of liver abscess presented with weakness and fatigue, poor p.o. intake and nausea with findings of 12 cm abscess in the gallbladder fossa and the liver. Patient had a placement of a 10 F percutaneous cholecystostomy tube by IR on 1/12.  A second hepatic abscess drain was not needed as the perforated gallbladder communicated with the abscess.  >250 mL purulent fluid was aspirated.    Subjective:   Patient complains of right upper quadrant pain on deep inspiration.  Reports that oxycodone does not help and is only relieved with Dilaudid.  No output on the drain this morning.  Remains afebrile.  Assessment  & Plan :    Principal Problem:   Liver abscess Status post percutaneous cholecystostomy drain on 1/12.  Etiology unclear and had prior liver abscess, [facility] and drained.  The culture grew group B strep (not enterococcus) and Klebsiella which was pansensitive.  Per note he was discharged on 2 weeks of Augmentin. Continues to have biliary drain output. Culture growing mixed flora.  Continue empiric cefepime, Flagyl added by ID and will be discharged on these.  PEG  placement ordered.  ID will determine final duration of antibiotic as outpatient.  Pain control with oxycodone 10 mg every 4 hours as needed and Dilaudid 2 mg every 4 hours.  Continue bowel regimen.  Surgery consult appreciated.   recommend patient will need outpatient follow-up with Dr Donell Beersbyerly for hepatic resection and cholecystectomy.  Active Problems: CAD with recent CABG in December 2019 Stable.  Continue beta-blocker, Lasix.  Resume aspirin upon discharge.  Transaminitis improving.  Continue to hold aspirin.  Acute kidney injury Possibly prerenal.  Avoid nephrotoxins.  Monitor with gentle hydration for 24 hours. He does have some leg edema so I have continued his daily Lasix.  A flutter Rate controlled.  Continue beta-blocker.  Resumed Eliquis   Iron deficiency anemia. Will add iron supplement.  Had normal colonoscopy 2 years back at [facility].    Non-insulin dependent type 2 diabetes mellitus (HCC) Holding metformin.  Monitor on sliding scale coverage.  Transaminitis Improving.  Statin on hold.    Protein-calorie malnutrition, severe Continue supplement.   Code Status : Full code  Family Communication  : None at bedside  Disposition Plan  : Possibly in the next 24-48 hours pending final fluid culture, PICC line placement and adequate pain control on oral regimen.  Barriers For Discharge : Active symptoms  Consults  : [place] [facility] (signed off), IR, ID  Procedures  : CT abdomen pelvis, IR cholecystostomy drain  DVT Prophylaxis  : Eliquis  Lab Results  Component Value Date   PLT 333 05/13/2018    Antibiotics  :    Anti-infectives (From admission, onward)   Start     Dose/Rate Route Frequency Ordered Stop   05/12/18 1615  metroNIDAZOLE (FLAGYL) tablet 500 mg     500 mg Oral Every 8 hours 05/12/18 1606     05/09/18 2100  metroNIDAZOLE (FLAGYL) IVPB 500 mg     500 mg 100 mL/hr over 60 Minutes Intravenous  Once 05/09/18 2006 05/09/18 2255   05/09/18 1830   ceFEPIme (MAXIPIME) 1 g in sodium chloride 0.9 % 100 mL IVPB     1 g 200 mL/hr over 30 Minutes Intravenous Every 8 hours 05/09/18 1816     05/09/18 1800  metroNIDAZOLE (FLAGYL) IVPB 500 mg  Status:  Discontinued     500 mg 100 mL/hr over 60 Minutes Intravenous  Once 05/09/18 1758 05/09/18 2006        Objective:   Vitals:   05/13/18 0500 05/13/18 0830 05/13/18 1000 05/13/18 1035  BP:  (!) 134/59 (!) 110/53 (!) 117/54  Pulse:  79 63 62  Resp:      Temp:  98 F (36.7 C)    TempSrc:  Oral    SpO2:  94%    Weight: 113.6 kg     Height:        Wt Readings from Last 3 Encounters:  05/13/18 113.6 kg  05/05/18 117.9 kg  05/01/18 119 kg     Intake/Output Summary (Last 24 hours) at 05/13/2018 1330 Last data filed at 05/13/2018 0448 Gross per 24 hour  Intake 685 ml  Output 200 ml  Net 485 ml    Physical exam Not in distress HEENT: Pallor present, moist mucosa, supple neck Chest: Clear bilaterally CVs: Normal S1-S2, no murmurs GI: Soft, nondistended, right upper quadrant drain in place with bilious output, minimal tenderness Musculoskeletal: Warm, no edema      Data Review:    CBC Recent Labs  Lab 05/09/18 2047 05/10/18 0534 05/11/18 0257 05/12/18 0321 05/13/18 0243  WBC 10.8* 10.9* 9.9 9.5 8.4  HGB 9.6* 9.0* 8.3* 8.3* 8.1*  HCT 30.8* 29.4* 27.5* 27.2* 26.8*  PLT 341 319 306 354 333  MCV 86.0 85.0 85.4 86.6 85.9  MCH 26.8 26.0 25.8* 26.4 26.0  MCHC 31.2 30.6 30.2 30.5 30.2  RDW 17.3* 17.3* 17.3* 17.5* 17.2*  LYMPHSABS  --   --  0.9 1.2  --  MONOABS  --   --  1.0 1.1*  --   EOSABS  --   --  0.0 0.1  --   BASOSABS  --   --  0.1 0.1  --     Chemistries  Recent Labs  Lab 05/09/18 1108 05/09/18 2047 05/10/18 0534 05/11/18 0257 05/12/18 0321 05/13/18 0243  NA 130*  --  132* 131* 130* 130*  K 4.3  --  4.0 3.8 4.1 4.1  CL 95*  --  97* 98 98 98  CO2 22  --  21* 22 22 21*  GLUCOSE 149*  --  143* 145* 151* 157*  BUN 20  --  16 11 11 13   CREATININE 1.46*  1.37* 1.19 1.10 1.14 1.37*  CALCIUM 9.2  --  9.0 8.6* 8.7* 8.9  AST 82*  --  43*  --   --  37  ALT 86*  --  64*  --   --  46*  ALKPHOS 228*  --  186*  --   --  181*  BILITOT 0.9  --  0.5  --   --  0.5   ------------------------------------------------------------------------------------------------------------------ No results for input(s): CHOL, HDL, LDLCALC, TRIG, CHOLHDL, LDLDIRECT in the last 72 hours.  Lab Results  Component Value Date   HGBA1C 6.5 (H) 05/09/2018   ------------------------------------------------------------------------------------------------------------------ No results for input(s): TSH, T4TOTAL, T3FREE, THYROIDAB in the last 72 hours.  Invalid input(s): FREET3 ------------------------------------------------------------------------------------------------------------------ Recent Labs    05/12/18 1500  TIBC 190*  IRON 12*    Coagulation profile Recent Labs  Lab 05/10/18 0907  INR 1.42    No results for input(s): DDIMER in the last 72 hours.  Cardiac Enzymes No results for input(s): CKMB, TROPONINI, MYOGLOBIN in the last 168 hours.  Invalid input(s): CK ------------------------------------------------------------------------------------------------------------------ No results found for: BNP  Inpatient Medications  Scheduled Meds: . amiodarone  200 mg Oral Daily  . apixaban  5 mg Oral BID  . docusate sodium  100 mg Oral BID  . feeding supplement  1 Container Oral TID BM  . finasteride  5 mg Oral Daily  . furosemide  40 mg Oral Daily  . insulin aspart  0-15 Units Subcutaneous TID WC  . insulin aspart  0-5 Units Subcutaneous QHS  . loratadine  10 mg Oral Daily  . metoprolol tartrate  25 mg Oral BID  . metroNIDAZOLE  500 mg Oral Q8H  . sodium chloride flush  3 mL Intravenous Q12H  . sodium chloride flush  5 mL Intracatheter Q8H   Continuous Infusions: . sodium chloride 75 mL/hr at 05/13/18 0841  . ceFEPime (MAXIPIME) IV 1 g (05/13/18  1103)   PRN Meds:.acetaminophen **OR** acetaminophen, HYDROmorphone (DILAUDID) injection, menthol-cetylpyridinium, ondansetron **OR** ondansetron (ZOFRAN) IV, oxyCODONE, polyethylene glycol, traZODone  Micro Results Recent Results (from the past 240 hour(s))  Aerobic/Anaerobic Culture (surgical/deep wound)     Status: None (Preliminary result)   Collection Time: 05/11/18 10:28 AM  Result Value Ref Range Status   Specimen Description ABSCESS LIVER  Final   Special Requests Normal  Final   Gram Stain   Final    MODERATE WBC PRESENT, PREDOMINANTLY PMN MODERATE GRAM NEGATIVE RODS Performed at [facility], [address] [place], [place] [address]    Culture   Final    CULTURE REINCUBATED FOR BETTER GROWTH NO ANAEROBES ISOLATED; CULTURE IN PROGRESS FOR 5 DAYS    Report Status PENDING  Incomplete    Radiology Reports Dg Chest 2 View  Result Date: 05/11/2018  CLINICAL DATA:  Shortness of breath EXAM: CHEST - 2 VIEW COMPARISON:  05/09/2018 FINDINGS: Lungs are clear.  No pleural effusion or pneumothorax. The heart is top-normal in size. Postsurgical changes related to prior CABG. Mild degenerative changes of the visualized thoracolumbar spine. Median sternotomy. IMPRESSION: Normal chest radiographs. Electronically Signed   By: Charline Bills M.D.   On: 05/11/2018 21:36   Dg Chest 2 View  Result Date: 05/05/2018 CLINICAL DATA:  Dry cough and shortness of breath.  Recent CABG. EXAM: CHEST - 2 VIEW COMPARISON:  Chest x-ray dated April 10, 2018. FINDINGS: Stable mild cardiomegaly status post CABG. Normal pulmonary vascularity. Unchanged peripheral scarring in the left mid lung. New trace left pleural effusion. The right lung is clear. No pneumothorax. No acute osseous abnormality. IMPRESSION: 1. New trace left pleural effusion. Electronically Signed   By: Obie Dredge M.D.   On: 05/05/2018 11:05   Ct Abdomen Pelvis W Contrast  Result Date: 05/09/2018 CLINICAL DATA:  Abdominal  pain, acute, generalized. Decreased appetite with weakness and nausea. Recent fever. EXAM: CT ABDOMEN AND PELVIS WITH CONTRAST TECHNIQUE: Multidetector CT imaging of the abdomen and pelvis was performed using the standard protocol following bolus administration of intravenous contrast. CONTRAST:  OMNIPAQUE IOHEXOL 300 MG/ML  SOLN COMPARISON:  CT abdomen pelvis 10/26/2016. FINDINGS: Lower chest: Lung bases are clear without focal nodule, mass, or airspace disease. Heart is mildly enlarged. Coronary artery calcifications are present. Patient is status post CABG. Hepatobiliary: There is diffuse fatty infiltration liver. New ill-defined multiloculated collection at the gallbladder fossa is concerning for complicated cholecystitis and hepatic abscess. Although there is no definite solid component. Neoplasm is not excluded. No significant intrahepatic biliary dilation or adenopathy is present. The common bile duct is otherwise normal. There are some inflammatory changes adjacent. Pancreas: Unremarkable. No pancreatic ductal dilatation or surrounding inflammatory changes. Spleen: Normal in size without focal abnormality. 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 The adrenal glands are otherwise normal. A 12 mm cyst is present in the midportion left kidney. An exophytic cyst at the lower pole of the left kidney measures 26 mm. Additional 12 mm parenchymal cyst is present near the lower pole of the left kidney is well. No focal lesions are present in the right kidney. There is no stone or hydronephrosis. Ureters are within normal limits bilaterally. The urinary bladder is normal. Stomach/Bowel: Stomach is within normal limits. Inflammatory changes are present in the second portion the duodenum, adjacent to the gallbladder. Distal duodenum and small bowel are within normal limits. Surgery of the ascending colon is noted. Is some secondary inflammatory change at the hepatic  flexure. Transverse colon is within normal limits. The distal descending colon demonstrates diverticular changes. Distal diverticular changes extend into the sigmoid colon or mild inflammatory changes are noted. There is no abscess or free air. Vascular/Lymphatic: Atherosclerotic calcifications are present in the aorta and branch vessels without aneurysm. No significant adenopathy is present. Reproductive: Prostate is unremarkable. Other: No abdominal wall hernia or abnormality. No abdominopelvic ascites. Musculoskeletal: Chronic endplate changes are most evident at L5-4 5 and to a lesser extent L3-4. Vertebral body heights are normal. Pelvis is unremarkable. Hips are located and within normal limits bilaterally. IMPRESSION: 1. Ill-defined multiloculated collection at the gallbladder fossa measures 12.5 x 10.2 x 7.0 cm. This most likely represents complicated cholecystitis and hepatic abscess. Secondary inflammatory changes are present in the duodenum and colon. Gallbladder neoplasm is considered less likely. 2. Descending and  sigmoid colonic diverticular disease with mild inflammatory changes in the sigmoid consistent with early diverticulitis. This likely accounts for the left lower quadrant pain. 3.  Aortic Atherosclerosis (ICD10-I70.0). 4. Multiple left renal cysts. Electronically Signed   By: Marin Roberts M.D.   On: 05/09/2018 16:54   Ir Perc Cholecystostomy  Result Date: 05/11/2018 INDICATION: 73 year old male with acute gangrenous cholecystitis and associated multilocular hepatic abscess. He presents for placement of a percutaneous cholecystostomy tube as well as potential percutaneous hepatic abscess drain placement. EXAM: CHOLECYSTOSTOMY MEDICATIONS: Patient is currently on intravenous antibiotics as an inpatient. No additional antibiotic coverage was required. ANESTHESIA/SEDATION: Moderate (conscious) sedation was employed during this procedure. A total of Versed 2 mg and Fentanyl 100 mcg was  administered intravenously. Moderate Sedation Time: 13 minutes. The patient's level of consciousness and vital signs were monitored continuously by radiology nursing throughout the procedure under my direct supervision. FLUOROSCOPY TIME:  Fluoroscopy Time: 0 minutes 54 seconds (8 mGy). COMPLICATIONS: None immediate. PROCEDURE: Informed written consent was obtained from the patient after a thorough discussion of the procedural risks, benefits and alternatives. All questions were addressed. Maximal Sterile Barrier Technique was utilized including caps, mask, sterile gowns, sterile gloves, sterile drape, hand hygiene and skin antiseptic. A timeout was performed prior to the initiation of the procedure. The right upper quadrant was interrogated with ultrasound. There is a complex multilocular fluid collection involving hepatic segment 4 B, 4 a and 5 surrounding the gallbladder. The gallbladder itself is distended, thick-walled and highly irregular. A suitable skin entry site was selected and marked. Local anesthesia was attained by infiltration with 1% lidocaine. A small dermatotomy was made. Under real-time sonographic guidance, a 21 gauge trocar needle was advanced through a short transhepatic course and into the lumen of the inflamed gallbladder. A gentle hand injection of contrast material opacifies the gallbladder lumen. Of note, the roof of the gallbladder along the hepatic surface is completely discontinuous and contrast material extravasated into the abscess collection. A 0.018 wire was advanced into the gallbladder lumen. The trocar needle was removed. The transitional sheath was advanced over the wire and into the gallbladder lumen. The microwire was exchanged for a short Amplatz wire. The transhepatic tract was then dilated to 10 [place] and a Cook 10.2 [place] all-purpose drainage catheter was advanced over the wire and formed in the gallbladder lumen. Aspiration was then performed. A total of 250 mL of thick,  foul-smelling purulent fluid and bile was successfully aspirated. Ultrasound was again used to interrogate the liver. The previously identified complex hepatic abscess has been completely successfully aspirated. There is no significant residual fluid collection in which to place a second drainage catheter. Therefore, the transhepatic percutaneous cholecystostomy tube was gently flushed, connected to gravity bag drainage and secured to the skin with 0 Prolene suture. The patient tolerated procedure well. A sample of aspirated purulent fluid will be sent for Gram stain and culture. IMPRESSION: 1. Successful placement of a transhepatic percutaneous drainage catheter for acute calculus and gangrenous cholecystitis with perforation and associated hepatic abscess. 2. The disrupted gallbladder lumen communicates freely with the intrahepatic abscess and all fluid was successfully aspirated through the percutaneous cholecystostomy tube. A second drainage catheter was not necessary. PLAN: 1. Maintain tube to gravity bag drainage. 2. Flush once per shift. 3. Follow-up in interventional radiology for cholecystostomy tube check and possible exchange in 6-8 weeks. Signed, Sterling Big, MD, RPVI [facility] Radiology Electronically Signed   By: Isac Caddy.D.  On: 05/11/2018 10:39   Dg Chest Port 1 View  Result Date: 05/09/2018 CLINICAL DATA:  Cough. EXAM: PORTABLE CHEST 1 VIEW COMPARISON:  05/05/2018 and 04/10/2018 FINDINGS: The heart size and pulmonary vascularity are normal. CABG. Slight scarring in the left midzone and left base. The lungs are otherwise clear. No visible effusions. No discrete bone abnormality. IMPRESSION: No acute cardiopulmonary abnormalities. Electronically Signed   By: Francene Boyers M.D.   On: 05/09/2018 11:44    Time Spent in minutes  25   Tawny Raspberry M.D on 05/13/2018 at 1:30 PM  Between 7am to 7pm - Pager -  [phone]  After 7pm go to [email or web address] - password [facility]  [facility] -  Office  [phone]

## 2018-05-13 NOTE — Progress Notes (Signed)
  Subjective: cabg dec 2 for unstable angina Feels better with biliary drain, iv antibiotics CXR today is clear Postop edema resolved Patient now in a-flutter, cont eliquis,amiodarone and metoprolol at home  Will followup in office in 3 weeks  Objective: Vital signs in last 24 hours: Temp:  [98 F (36.7 C)-98.4 F (36.9 C)] 98 F (36.7 C) (01/14 1359) Pulse Rate:  [56-84] 56 (01/14 1359) Cardiac Rhythm: Atrial flutter (01/14 0700) Resp:  [18-19] 18 (01/14 1359) BP: (110-142)/(53-68) 131/65 (01/14 1359) SpO2:  [90 %-97 %] 96 % (01/14 1359) Weight:  [113.6 kg] 113.6 kg (01/14 0500)  Hemodynamic parameters for last 24 hours:  stable  Intake/Output from previous day: 01/13 0701 - 01/14 0700 In: 685 [P.O.:680] Out: 200 [Drains:200] Intake/Output this shift: Total I/O In: 360 [P.O.:360] Out: 50 [Drains:50]  Lungs clear Sternal incision clean,dry HR irregular- atrial flutter  Lab Results: Recent Labs    05/12/18 0321 05/13/18 0243  WBC 9.5 8.4  HGB 8.3* 8.1*  HCT 27.2* 26.8*  PLT 354 333   BMET:  Recent Labs    05/12/18 0321 05/13/18 0243  NA 130* 130*  K 4.1 4.1  CL 98 98  CO2 22 21*  GLUCOSE 151* 157*  BUN 11 13  CREATININE 1.14 1.37*  CALCIUM 8.7* 8.9    PT/INR: No results for input(s): LABPROT, INR in the last 72 hours. ABG    Component Value Date/Time   PHART 7.316 (L) 04/02/2018 0649   HCO3 21.5 04/02/2018 0649   TCO2 23 04/02/2018 0649   ACIDBASEDEF 4.0 (H) 04/02/2018 0649   O2SAT 56.5 04/07/2018 0340   CBG (last 3)  Recent Labs    05/13/18 0811 05/13/18 1232 05/13/18 1659  GLUCAP 125* 145* 157*    Assessment/Plan: S/P  Will see as outpatient Should be safe for cholecystectomy after march 1   LOS: 4 days    Kathlee Nations Trigt III 05/13/2018

## 2018-05-13 NOTE — Progress Notes (Signed)
[facility]  [facility] Coordinator will follow pt with ID team to support Home Infusion Pharm[facility] services for home IVABX at DC.  If patient discharges after hours, please call [phone].   [name] 05/13/2018, 8:21 AM

## 2018-05-13 NOTE — Final Consult Note (Signed)
Consultant Final Sign-Off Note    Assessment/Final recommendations  [name] is a 73 y.o. male with a history of HTN, HLD, DM, recent CABG followed by me for liver abscess.    Wound care (if applicable):    Diet at discharge: per primary team low fat diet   Activity at discharge: per primary team   Follow-up appointment:  Dr. Donell Beers in 4-6 weeks    Pending results:  Unresulted Labs (From admission, onward)   None       Medication recommendations: Per ID.    Other recommendations: Low fat diet.     Thank you for allowing [place] to participate in the care of your patient!  Please consult [place] again if you have further needs for your patient.  Elmer Sow [facility] 05/13/2018 9:33 AM    Subjective  Doing well this morning. Pain improved. No N/V. 200cc output from drain yesterday. Passing flatus.    Objective  Vital signs in last 24 hours: Temp:  [98.1 F (36.7 C)-98.6 F (37 C)] 98.1 F (36.7 C) (01/14 0447) Pulse Rate:  [79-90] 84 (01/14 0447) Resp:  [18-19] 19 (01/14 0447) BP: (116-142)/(58-68) 116/64 (01/14 0447) SpO2:  [90 %-97 %] 97 % (01/14 0447) Weight:  [113.6 kg] 113.6 kg (01/14 0500)  General: Heart: RRR Lungs: CTA b/l Abd: Soft, non-distended. Mild tenderness in RUQ. + BS. Scant thin brown drainage in perc drain. Dressing intact and clean.    Pertinent labs and Studies: Recent Labs    05/11/18 0257 05/12/18 0321 05/13/18 0243  WBC 9.9 9.5 8.4  HGB 8.3* 8.3* 8.1*  HCT 27.5* 27.2* 26.8*   BMET Recent Labs    05/12/18 0321 05/13/18 0243  NA 130* 130*  K 4.1 4.1  CL 98 98  CO2 22 21*  GLUCOSE 151* 157*  BUN 11 13  CREATININE 1.14 1.37*  CALCIUM 8.7* 8.9   No results for input(s): LABURIN in the last 72 hours. Results for orders placed or performed during the hospital encounter of 05/09/18  Aerobic/Anaerobic Culture (surgical/deep wound)     Status: Abnormal (Preliminary result)   Collection Time: 05/11/18 10:28 AM  Result Value Ref  Range Status   Specimen Description ABSCESS LIVER  Final   Special Requests Normal  Final   Gram Stain   Final    MODERATE WBC PRESENT, PREDOMINANTLY PMN MODERATE GRAM NEGATIVE RODS Performed at [facility], [address], [place], [place] [address]    Culture MULTIPLE ORGANISMS PRESENT, NONE PREDOMINANT (A)  Final   Report Status PENDING  Incomplete    Imaging: No results found.

## 2018-05-13 NOTE — Progress Notes (Signed)
Tele called and reported 6 beat run vtach. Called again 10 minutes later and reported multifocale PVC's. Text paged Dr Clearence Ped

## 2018-05-14 ENCOUNTER — Encounter

## 2018-05-14 ENCOUNTER — Encounter: Payer: Medicare HMO | Admitting: Cardiothoracic Surgery

## 2018-05-14 ENCOUNTER — Encounter (HOSPITAL_COMMUNITY): Payer: Self-pay | Admitting: General Practice

## 2018-05-14 ENCOUNTER — Inpatient Hospital Stay (HOSPITAL_COMMUNITY): Payer: Medicare HMO

## 2018-05-14 LAB — COMPREHENSIVE METABOLIC PANEL
ALT: 36 U/L (ref 0–44)
AST: 30 U/L (ref 15–41)
Albumin: 2.1 g/dL — ABNORMAL LOW (ref 3.5–5.0)
Alkaline Phosphatase: 155 U/L — ABNORMAL HIGH (ref 38–126)
Anion gap: 12 (ref 5–15)
BUN: 15 mg/dL (ref 8–23)
CO2: 22 mmol/L (ref 22–32)
Calcium: 8.8 mg/dL — ABNORMAL LOW (ref 8.9–10.3)
Chloride: 98 mmol/L (ref 98–111)
Creatinine, Ser: 1.43 mg/dL — ABNORMAL HIGH (ref 0.61–1.24)
GFR calc Af Amer: 55 mL/min — ABNORMAL LOW (ref >60)
GFR calc non Af Amer: 47 mL/min — ABNORMAL LOW (ref >60)
Glucose, Bld: 128 mg/dL — ABNORMAL HIGH (ref 70–99)
Potassium: 3.7 mmol/L (ref 3.5–5.1)
SODIUM: 132 mmol/L — AB (ref 135–145)
Total Bilirubin: 0.5 mg/dL (ref 0.3–1.2)
Total Protein: 5.7 g/dL — ABNORMAL LOW (ref 6.5–8.1)

## 2018-05-14 LAB — GLUCOSE, CAPILLARY
Glucose-Capillary: 116 mg/dL — ABNORMAL HIGH (ref 70–99)
Glucose-Capillary: 189 mg/dL — ABNORMAL HIGH (ref 70–99)
Glucose-Capillary: 125 mg/dL — ABNORMAL HIGH (ref 70–99)
Glucose-Capillary: 126 mg/dL — ABNORMAL HIGH (ref 70–99)

## 2018-05-14 MED ORDER — SODIUM CHLORIDE 0.9% FLUSH
10.0000 mL | INTRAVENOUS | Status: DC | PRN
Start: 2018-05-14 — End: 2018-05-15

## 2018-05-14 MED ORDER — OXYCODONE HCL 5 MG PO TABS
15.0000 mg | ORAL | Status: DC | PRN
Start: 2018-05-14 — End: 2018-05-15
  Administered 2018-05-14 – 2018-05-15 (×5): 15 mg via ORAL
  Filled 2018-05-14 (×5): qty 3

## 2018-05-14 NOTE — Progress Notes (Signed)
PROGRESS NOTE    [name]  NGE:[id number] DOB: February 28, 1943 DOA: 05/09/2018 PCP: Karle PlumberArvind, Moogali M, MD     Brief Narrative:  Calvin FantasiaLarry F Price is a 73 year old male with history of CAD, recent CABG, A Flutter (recent) on Eliquis, type 2 diabetes mellitus, severe protein calorie malnutrition, hyperlipidemia with prior history of liver abscess presented with weakness and fatigue, poor p.o. intake and nausea with findings of 12 cm abscess in the gallbladder fossa and the liver. Patient had a placement of a 10 F percutaneous cholecystostomy tube by IR on 1/12. A second hepatic abscess drain was not needed as the perforated gallbladder communicated with the abscess. >250 mL purulent fluid was aspirated.   New events last 24 hours / Subjective: Continues to have some pain in the right upper quadrant region.  Currently well controlled, 3 out of 10.  At night, pain can get up to 10 out of 10 which is relieved with IV Dilaudid.  Wants to go home.  Denies any nausea or vomiting.  Passing gas, no bowel movement yet.  Assessment & Plan:   Principal Problem:   Liver abscess Active Problems:   Essential hypertension   Hyperlipidemia   Hx of CABG   Atrial flutter (HCC)   Anemia   Non-insulin dependent type 2 diabetes mellitus (HCC)   Protein-calorie malnutrition, severe   Acute gangrenous cholecystitis   Liver abscess Status post percutaneous cholecystostomy drain on 1/12.  Continue cefepime, Flagyl.  Infectious disease following, PICC line placed today.  Awaiting final antibiotic recommendation pending culture result.  Patient needs to follow with Dr. Donell BeersByerly in 4-6 weeks for hepatic resection, cholecystectomy. IR will schedule follow up appointment in approximately 6 weeks for drain evaluation/CT. Continue daily flushes with 5cc NS and record output daily.   CAD with recent CABG December 2019 Appreciate cardiothoracic surgery. Aspirin on hold currently. Follow up with Cardiothoracic surgery in 3  weeks   Acute kidney injury, likely prerenal Creatinine slightly increased today, stop Lasix and continue oral hydration  Atrial flutter Rate controlled, continue lopressor, Eliquis, amiodarone  Iron deficiency anemia Continue iron supplementation, [facility]  Non-insulin-dependent type 2 diabetes Hold metformin, continue sliding scale insulin  Severe protein calorie malnutrition Continue supplementation  Elevated liver enzymes Statin on hold, liver enzymes trending down    DVT prophylaxis: Eliquis Code Status: Full Family Communication: At bedside Disposition Plan: Pending culture results and final antibiotic recommendation from ID   Consultants:   General Surgery  ID  IR  CTS    Antimicrobials:  Anti-infectives (From admission, onward)   Start     Dose/Rate Route Frequency Ordered Stop   05/12/18 1615  metroNIDAZOLE (FLAGYL) tablet 500 mg     500 mg Oral Every 8 hours 05/12/18 1606     05/09/18 2100  metroNIDAZOLE (FLAGYL) IVPB 500 mg     500 mg 100 mL/hr over 60 Minutes Intravenous  Once 05/09/18 2006 05/09/18 2255   05/09/18 1830  ceFEPIme (MAXIPIME) 1 g in sodium chloride 0.9 % 100 mL IVPB     1 g 200 mL/hr over 30 Minutes Intravenous Every 8 hours 05/09/18 1816     05/09/18 1800  metroNIDAZOLE (FLAGYL) IVPB 500 mg  Status:  Discontinued     500 mg 100 mL/hr over 60 Minutes Intravenous  Once 05/09/18 1758 05/09/18 2006       Objective: Vitals:   05/13/18 1359 05/13/18 2157 05/14/18 0500 05/14/18 0528  BP: 131/65 126/60  116/81  Pulse: (!) 56 79  73  Resp: 18 16  16   Temp: 98 F (36.7 C) 98 F (36.7 C)  98 F (36.7 C)  TempSrc: Oral Oral  Oral  SpO2: 96% 96%  94%  Weight:   113.5 kg   Height:        Intake/Output Summary (Last 24 hours) at 05/14/2018 1434 Last data filed at 05/14/2018 0937 Gross per 24 hour  Intake 1840.96 ml  Output 50 ml  Net 1790.96 ml   Filed Weights   05/12/18 0500 05/13/18 0500  05/14/18 0500  Weight: 113.3 kg 113.6 kg 113.5 kg    Examination:  General exam: Appears calm and comfortable  Respiratory system: Clear to auscultation. Respiratory effort normal. Cardiovascular system: S1 & S2 heard, RRR. No JVD, murmurs, rubs, gallops or clicks. No pedal edema. Gastrointestinal system: Abdomen is nondistended, soft and nontender. No organomegaly or masses felt. Normal bowel sounds heard. +chole drain with serosanguinous fluid  Central nervous system: Alert and oriented. No focal neurological deficits. Extremities: Symmetric 5 x 5 power. Skin: No rashes, lesions or ulcers Psychiatry: Judgement and insight appear normal. Mood & affect appropriate.   Data Reviewed: I have personally reviewed following labs and imaging studies  CBC: Recent Labs  Lab 05/09/18 2047 05/10/18 0534 05/11/18 0257 05/12/18 0321 05/13/18 0243  WBC 10.8* 10.9* 9.9 9.5 8.4  NEUTROABS  --   --  7.8* 7.0  --   HGB 9.6* 9.0* 8.3* 8.3* 8.1*  HCT 30.8* 29.4* 27.5* 27.2* 26.8*  MCV 86.0 85.0 85.4 86.6 85.9  PLT 341 319 306 354 333   Basic Metabolic Panel: Recent Labs  Lab 05/10/18 0534 05/11/18 0257 05/12/18 0321 05/13/18 0243 05/14/18 0238  NA 132* 131* 130* 130* 132*  K 4.0 3.8 4.1 4.1 3.7  CL 97* 98 98 98 98  CO2 21* 22 22 21* 22  GLUCOSE 143* 145* 151* 157* 128*  BUN 16 11 11 13 15   CREATININE 1.19 1.10 1.14 1.37* 1.43*  CALCIUM 9.0 8.6* 8.7* 8.9 8.8*   GFR: Estimated Creatinine Clearance: 56.3 mL/min (A) (by C-G formula based on SCr of 1.43 mg/dL (H)). Liver Function Tests: Recent Labs  Lab 05/09/18 1108 05/10/18 0534 05/13/18 0243 05/14/18 0238  AST 82* 43* 37 30  ALT 86* 64* 46* 36  ALKPHOS 228* 186* 181* 155*  BILITOT 0.9 0.5 0.5 0.5  PROT 6.6 6.3* 5.9* 5.7*  ALBUMIN 2.4* 2.3* 2.1* 2.1*   Recent Labs  Lab 05/09/18 1108  LIPASE 42   No results for input(s): AMMONIA in the last 168 hours. Coagulation Profile: Recent Labs  Lab 05/10/18 0907  INR 1.42    Cardiac Enzymes: No results for input(s): CKTOTAL, CKMB, CKMBINDEX, TROPONINI in the last 168 hours. BNP (last 3 results) No results for input(s): PROBNP in the last 8760 hours. HbA1C: No results for input(s): HGBA1C in the last 72 hours. CBG: Recent Labs  Lab 05/13/18 1232 05/13/18 1659 05/13/18 2155 05/14/18 0830 05/14/18 1212  GLUCAP 145* 157* 170* 116* 189*   Lipid Profile: No results for input(s): CHOL, HDL, LDLCALC, TRIG, CHOLHDL, LDLDIRECT in the last 72 hours. Thyroid Function Tests: No results for input(s): TSH, T4TOTAL, FREET4, T3FREE, THYROIDAB in the last 72 hours. Anemia Panel: Recent Labs    05/12/18 1500  TIBC 190*  IRON 12*   Sepsis Labs: Recent Labs  Lab 05/09/18 1119  LATICACIDVEN 1.79    Recent Results (from the past 240 hour(s))  Aerobic/Anaerobic Culture (surgical/deep wound)  Status: None (Preliminary result)   Collection Time: 05/11/18 10:28 AM  Result Value Ref Range Status   Specimen Description ABSCESS LIVER  Final   Special Requests Normal  Final   Gram Stain   Final    MODERATE WBC PRESENT, PREDOMINANTLY PMN MODERATE GRAM NEGATIVE RODS Performed at [facility], [address] [place], [place] [address]    Culture   Final    MODERATE GRAM NEGATIVE RODS IDENTIFICATION AND SUSCEPTIBILITIES TO FOLLOW NO ANAEROBES ISOLATED; CULTURE IN PROGRESS FOR 5 DAYS    Report Status PENDING  Incomplete       Radiology Studies: Dg Chest Port 1 View  Result Date: 05/14/2018 CLINICAL DATA:  PICC line placement EXAM: PORTABLE CHEST 1 VIEW COMPARISON:  05/11/2018 FINDINGS: There is a right-sided PICC line with the tip projecting over the SVC. There is no focal parenchymal opacity. There is no pleural effusion or pneumothorax. There is mild bilateral interstitial prominence. There is stable cardiomegaly. There is evidence of prior CABG. The osseous structures are unremarkable. IMPRESSION: Right-sided PICC line with the tip projecting over  the SVC. Electronically Signed   By: Elige KoHetal  Patel   On: 05/14/2018 11:58   [place] Ekg Site Rite  Result Date: 05/13/2018 If Site Rite image not attached, placement could not be confirmed due to current cardiac rhythm.     Scheduled Meds: . amiodarone  200 mg Oral Daily  . apixaban  5 mg Oral BID  . docusate sodium  100 mg Oral BID  . feeding supplement  1 Container Oral TID BM  . ferrous sulfate  325 mg Oral BID WC  . finasteride  5 mg Oral Daily  . insulin aspart  0-15 Units Subcutaneous TID WC  . insulin aspart  0-5 Units Subcutaneous QHS  . loratadine  10 mg Oral Daily  . metoprolol tartrate  25 mg Oral BID  . metroNIDAZOLE  500 mg Oral Q8H  . sodium chloride flush  3 mL Intravenous Q12H  . sodium chloride flush  5 mL Intracatheter Q8H   Continuous Infusions: . ceFEPime (MAXIPIME) IV 1 g (05/14/18 1147)     LOS: 5 days    Time spent: 35  minutes   Noralee StainJennifer Chosen Geske, DO [facility] [email or web address] 05/14/2018, 2:34 PM

## 2018-05-14 NOTE — Care Management Note (Signed)
Case Management Note  Patient Details  Name: [name] MRN: [id number] Date of Birth: [birth year]  Subjective/Objective:                    Action/Plan: Patient will need prescription for saline flushes. Text paged MD  Expected Discharge Date:                  Expected Discharge Plan:  Home w Home Health Services  In-House Referral:     Discharge planning Services  CM Consult  Post Acute Care Choice:  Home Health Choice offered to:  Patient, Adult Children  DME Arranged:  N/A DME Agency:  NA  HH Arranged:  RN, PT HH Agency:  [facility]  Status of Service:  In process, will continue to follow  If discussed at Long Length of Stay Meetings, dates discussed:    Additional Comments:  Kingsley Plan, RN 05/14/2018, 9:47 AM

## 2018-05-14 NOTE — Progress Notes (Signed)
[facility]   Date of Admission:  05/09/2018                                   Total days of antibiotics 5                                                                                     Day 3 Cefepime                                                                                     Day 2 Metronidazole  Assessment: [name] is a 73 year old male with diabetes, recent hospitalization for CABG x4, and prior liver absess who is admitted with a loculated liver abscess and acute gangrenous cholecystitis, now status post cholecystostomy tube placement. Abscess cultures show multiple organisms; re-culture in process. 100cc from cholecystomy tube in past 24 hours. Patient is to follow up with IR and General surgery as outpatient. Continue Cefepime and metronidazole while we await culture speciation. Expect 4-6 weeks total duration of antibiotics.  Plan:  1. Liver Abscess secondary to Acute Gangrenous Cholecystitis - Continue Cefepime and PO Metronidazole - PICC line to be placed - Await Culture Speciation - Monitor drain output - Encourage mobilization  Principal Problem:   Liver abscess Active Problems:   Acute gangrenous cholecystitis   Non-insulin dependent type 2 diabetes mellitus (HCC)   Essential hypertension   Hyperlipidemia   Hx of CABG   Atrial flutter (HCC)   Anemia   Protein-calorie malnutrition, severe   Scheduled Meds: . amiodarone  200 mg Oral Daily  . apixaban  5 mg Oral BID  . docusate sodium  100 mg Oral BID  . feeding supplement  1 Container Oral TID BM  . ferrous sulfate  325 mg Oral BID WC  . finasteride  5 mg Oral Daily  . insulin aspart  0-15 Units Subcutaneous TID WC  . insulin aspart  0-5 Units Subcutaneous QHS  . loratadine  10 mg Oral Daily  . metoprolol tartrate  25 mg Oral BID  . metroNIDAZOLE  500 mg Oral Q8H  . sodium chloride flush  3 mL Intravenous Q12H  . sodium chloride flush  5 mL Intracatheter Q8H     Continuous Infusions: . ceFEPime (MAXIPIME) IV Stopped (05/14/18 0253)   PRN Meds:.acetaminophen **OR** acetaminophen, HYDROmorphone (DILAUDID) injection, menthol-cetylpyridinium, ondansetron **OR** ondansetron (ZOFRAN) IV, oxyCODONE, polyethylene glycol, traZODone   SUBJECTIVE: [name] was seen up in his bedside chair today. He states he is feeling okay today. His pain remains unchanged (dull at baseline with sharp pain on deep inspiration). He has not had a bowl movement thus far. 6 beat run of V-tach reported overnight. He states he  is looking forward to going home.  Review of Systems: Review of Systems  Constitutional: Negative for chills and fever.  Gastrointestinal: Negative for nausea.    Allergies  Allergen Reactions  . Ciprofloxacin Rash  . Erythromycin Ethylsuccinate Rash    OBJECTIVE: Vitals:   05/13/18 1359 05/13/18 2157 05/14/18 0500 05/14/18 0528  BP: 131/65 126/60  116/81  Pulse: (!) 56 79  73  Resp: 18 16  16   Temp: 98 F (36.7 C) 98 F (36.7 C)  98 F (36.7 C)  TempSrc: Oral Oral  Oral  SpO2: 96% 96%  94%  Weight:   113.5 kg   Height:       Body mass index is 34.9 kg/m.  Physical Exam Constitutional:      General: He is not in acute distress. Cardiovascular:     Rate and Rhythm: Normal rate and regular rhythm.     Heart sounds: Normal heart sounds.  Pulmonary:     Effort: Pulmonary effort is normal. No respiratory distress.     Breath sounds: Normal breath sounds.  Abdominal:     General: Bowel sounds are normal. There is no distension.     Palpations: Abdomen is soft.     Comments: Cholecystomy drain with serosanguinous drainage Tender to palpation of RUQ  Musculoskeletal:        General: No swelling or tenderness.     Comments: Trace Bilateral LE edema R>L  Skin:    General: Skin is warm and dry.  Neurological:     General: No focal deficit present.     Mental Status: He is alert. Mental status is at baseline.    Lab Results Lab  Results  Component Value Date   WBC 8.4 05/13/2018   HGB 8.1 (L) 05/13/2018   HCT 26.8 (L) 05/13/2018   MCV 85.9 05/13/2018   PLT 333 05/13/2018    Lab Results  Component Value Date   CREATININE 1.43 (H) 05/14/2018   BUN 15 05/14/2018   NA 132 (L) 05/14/2018   K 3.7 05/14/2018   CL 98 05/14/2018   CO2 22 05/14/2018    Lab Results  Component Value Date   ALT 36 05/14/2018   AST 30 05/14/2018   ALKPHOS 155 (H) 05/14/2018   BILITOT 0.5 05/14/2018     Microbiology: Recent Results (from the past 240 hour(s))  Aerobic/Anaerobic Culture (surgical/deep wound)     Status: None (Preliminary result)   Collection Time: 05/11/18 10:28 AM  Result Value Ref Range Status   Specimen Description ABSCESS LIVER  Final   Special Requests Normal  Final   Gram Stain   Final    MODERATE WBC PRESENT, PREDOMINANTLY PMN MODERATE GRAM NEGATIVE RODS Performed at [facility], [address], [place], [place] [address]    Culture   Final    CULTURE REINCUBATED FOR BETTER GROWTH NO ANAEROBES ISOLATED; CULTURE IN PROGRESS FOR 5 DAYS    Report Status PENDING  Incomplete    Beola Cord, MD IM PGY-2 [phone] pager   [phone] cell 05/14/2018, 9:45 AM

## 2018-05-14 NOTE — Progress Notes (Signed)
Peripherally Inserted Central Catheter/Midline Placement  The IV Nurse has discussed with the patient and/or persons authorized to consent for the patient, the purpose of this procedure and the potential benefits and risks involved with this procedure.  The benefits include less needle sticks, lab draws from the catheter, and the patient may be discharged home with the catheter. Risks include, but not limited to, infection, bleeding, blood clot (thrombus formation), and puncture of an artery; nerve damage and irregular heartbeat and possibility to perform a PICC exchange if needed/ordered by physician.  Alternatives to this procedure were also discussed.  Bard Power PICC patient education guide, fact sheet on infection prevention and patient information card has been provided to patient /or left at bedside.    PICC/Midline Placement Documentation        [name] 05/14/2018, 11:26 AM

## 2018-05-15 DIAGNOSIS — B961 Klebsiella pneumoniae [K. pneumoniae] as the cause of diseases classified elsewhere: Secondary | ICD-10-CM

## 2018-05-15 DIAGNOSIS — K819 Cholecystitis, unspecified: Secondary | ICD-10-CM

## 2018-05-15 DIAGNOSIS — N179 Acute kidney failure, unspecified: Secondary | ICD-10-CM

## 2018-05-15 DIAGNOSIS — Z95828 Presence of other vascular implants and grafts: Secondary | ICD-10-CM

## 2018-05-15 DIAGNOSIS — B9620 Unspecified Escherichia coli [E. coli] as the cause of diseases classified elsewhere: Secondary | ICD-10-CM

## 2018-05-15 DIAGNOSIS — Z978 Presence of other specified devices: Secondary | ICD-10-CM

## 2018-05-15 DIAGNOSIS — Z1611 Resistance to penicillins: Secondary | ICD-10-CM

## 2018-05-15 LAB — COMPREHENSIVE METABOLIC PANEL
ALBUMIN: 2.1 g/dL — AB (ref 3.5–5.0)
ALT: 30 U/L (ref 0–44)
AST: 25 U/L (ref 15–41)
Alkaline Phosphatase: 130 U/L — ABNORMAL HIGH (ref 38–126)
Anion gap: 9 (ref 5–15)
BUN: 12 mg/dL (ref 8–23)
CO2: 24 mmol/L (ref 22–32)
Calcium: 8.9 mg/dL (ref 8.9–10.3)
Chloride: 102 mmol/L (ref 98–111)
Creatinine, Ser: 1.21 mg/dL (ref 0.61–1.24)
GFR calc Af Amer: >60 mL/min (ref >60)
GFR calc non Af Amer: 58 mL/min — ABNORMAL LOW (ref >60)
GLUCOSE: 128 mg/dL — AB (ref 70–99)
Potassium: 3.7 mmol/L (ref 3.5–5.1)
SODIUM: 135 mmol/L (ref 135–145)
Total Bilirubin: 0.5 mg/dL (ref 0.3–1.2)
Total Protein: 5.3 g/dL — ABNORMAL LOW (ref 6.5–8.1)

## 2018-05-15 LAB — GLUCOSE, CAPILLARY
Glucose-Capillary: 133 mg/dL — ABNORMAL HIGH (ref 70–99)
Glucose-Capillary: 120 mg/dL — ABNORMAL HIGH (ref 70–99)

## 2018-05-15 LAB — CBC
HCT: 26.4 % — ABNORMAL LOW (ref 39.0–52.0)
Hemoglobin: 7.9 g/dL — ABNORMAL LOW (ref 13.0–17.0)
MCH: 26.2 pg (ref 26.0–34.0)
MCHC: 29.9 g/dL — ABNORMAL LOW (ref 30.0–36.0)
MCV: 87.7 fL (ref 80.0–100.0)
Platelets: 394 K/uL (ref 150–400)
RBC: 3.01 MIL/uL — ABNORMAL LOW (ref 4.22–5.81)
RDW: 17.2 % — ABNORMAL HIGH (ref 11.5–15.5)
WBC: 6 K/uL (ref 4.0–10.5)
nRBC: 0 % (ref 0.0–0.2)

## 2018-05-15 MED ORDER — CEFTRIAXONE IV (FOR PTA / DISCHARGE USE ONLY)
2.0000 g | INTRAVENOUS | 0 refills | Status: AC
Start: 2018-05-15 — End: 2018-06-05

## 2018-05-15 MED ORDER — SODIUM CHLORIDE 0.9 % IV SOLN
2.0000 g | INTRAVENOUS | Status: DC
Start: 2018-05-15 — End: 2018-05-15
  Administered 2018-05-15: 2 g via INTRAVENOUS
  Filled 2018-05-15: qty 20

## 2018-05-15 MED ORDER — SODIUM CHLORIDE 0.9% FLUSH
INTRAVENOUS | 1 refills | Status: DC
Start: 2018-05-15 — End: 2018-10-01

## 2018-05-15 MED ORDER — HEPARIN SOD (PORK) LOCK FLUSH 100 UNIT/ML IV SOLN
250.0000 [IU] | INTRAVENOUS | Status: AC | PRN
Start: 2018-05-15 — End: 2018-05-15
  Administered 2018-05-15: 250 [IU]

## 2018-05-15 MED ORDER — GLUCERNA SHAKE PO LIQD
237.0000 mL | Freq: Three times a day (TID) | ORAL | Status: DC
Start: 2018-05-15 — End: 2018-05-15
  Administered 2018-05-15: 237 mL via ORAL

## 2018-05-15 MED ORDER — OXYCODONE HCL 5 MG PO TABS
5.0000 mg | Freq: Four times a day (QID) | ORAL | 0 refills | Status: AC | PRN
Start: 2018-05-15 — End: 2018-05-20

## 2018-05-15 MED ORDER — ADULT MULTIVITAMIN W/MINERALS CH
1.0000 | Freq: Every day | ORAL | Status: DC
Start: 2018-05-15 — End: 2018-05-15
  Administered 2018-05-15: 1 via ORAL
  Filled 2018-05-15: qty 1

## 2018-05-15 NOTE — Progress Notes (Signed)
Referring Physician(s): Dr Burman Foster  Supervising Physician: Malachy Moan  Patient Status:  [facility] - In-pt  Chief Complaint:  Follow up percutaneous cholecystostomy placed 05/11/18 by Dr. Archer Asa  Subjective:  Pt feels better Much less pain if any OP from chole drain is steady Blood tinged bile Home soon  Allergies: Ciprofloxacin and Erythromycin ethylsuccinate  Medications: Prior to Admission medications   Medication Sig Start Date End Date Taking? Authorizing Provider  acetaminophen (TYLENOL) 500 MG tablet Take 1,000 mg by mouth daily as needed for moderate pain.   Yes [provider]  amiodarone (PACERONE) 200 MG tablet Take 1 tablet (200 mg total) by mouth daily. 04/14/18  Yes Doree Fudge M, PA-C  amLODipine (NORVASC) 5 MG tablet Take 5 mg by mouth daily. 02/05/18  Yes [provider]  aspirin EC 81 MG tablet Take 1 tablet (81 mg total) by mouth daily. 05/01/18  Yes Kilroy, Eda Paschal, PA-C  Cholecalciferol (VITAMIN D3) 2000 units TABS Take 2,000 Units by mouth 2 (two) times daily.    Yes [provider]  docusate sodium (COLACE) 100 MG capsule Take 100 mg by mouth 2 (two) times daily.    Yes [provider]  finasteride (PROSCAR) 5 MG tablet Take 5 mg by mouth daily. 02/05/18  Yes [provider]  furosemide (LASIX) 40 MG tablet Take 1 tablet (40 mg total) by mouth daily. 05/05/18  Yes Barrett, Erin R, PA-C  levalbuterol (XOPENEX) 0.31 MG/3ML nebulizer solution Take 1 ampule by nebulization every 6 (six) hours as needed (COUGH OR DYSPNEA).   Yes [provider]  loratadine (CLARITIN) 10 MG tablet Take 10 mg by mouth daily.   Yes [provider]  metFORMIN (GLUCOPHAGE) 1000 MG tablet Take 1,000 mg by mouth 2 (two) times daily. 02/05/18  Yes [provider]  metoprolol tartrate (LOPRESSOR) 25 MG tablet Take 1 tablet (25 mg total) by mouth 2 (two) times daily. 04/14/18  Yes Doree Fudge M, PA-C    Multiple Vitamins-Minerals (MULTIVITAMIN ADULT PO) Take 1 tablet by mouth daily.   Yes [provider]  oxyCODONE (OXY IR/ROXICODONE) 5 MG immediate release tablet Take 5 mg by mouth every 4-6 hours PRN severe pain 04/14/18  Yes Doree Fudge M, PA-C  potassium chloride 20 MEQ TBCR Take 10 mEq by mouth daily. 05/05/18  Yes Barrett, Erin R, PA-C  pravastatin (PRAVACHOL) 40 MG tablet Take 40 mg by mouth at bedtime.  02/05/18  Yes [provider]  apixaban (ELIQUIS) 5 MG TABS tablet Take 1 tablet (5 mg total) by mouth 2 (two) times daily. 05/01/18   Abelino Derrick, PA-C  furosemide (LASIX) 40 MG tablet Take 1 tablet (40 mg total) by mouth daily for 5 days. Patient not taking: Reported on 05/11/2018 05/01/18 05/11/18  Abelino Derrick, PA-C  potassium chloride SA (K-DUR,KLOR-CON) 20 MEQ tablet Take 1 tablet (20 mEq total) by mouth daily for 5 days. Patient not taking: Reported on 05/05/2018 05/01/18 05/06/18  Abelino Derrick, PA-C     Vital Signs: BP 112/69   Pulse 76   Temp 98.1 F (36.7 C) (Oral)   Resp 18   Ht 5\' 11"  (1.803 m)   Wt 250 lb 3.6 oz (113.5 kg)   SpO2 98%   BMI 34.90 kg/m   Physical Exam Vitals signs reviewed.  Skin:    General: Skin is warm and dry.     Comments: Site is clean and dry Minimally tender OP blood tinged bile 50-  200 cc daily Wbc wnl Afeb      Imaging: Dg Chest 2 View  Result Date: 05/11/2018 CLINICAL DATA:  Shortness of breath EXAM: CHEST - 2 VIEW COMPARISON:  05/09/2018 FINDINGS: Lungs are clear.  No pleural effusion or pneumothorax. The heart is top-normal in size. Postsurgical changes related to prior CABG. Mild degenerative changes of the visualized thoracolumbar spine. Median sternotomy. IMPRESSION: Normal chest radiographs. Electronically Signed   By: Charline Bills M.D.   On: 05/11/2018 21:36   Ir Perc Cholecystostomy  Result Date: 05/11/2018 INDICATION: 73 year old male with acute gangrenous cholecystitis and associated  multilocular hepatic abscess. He presents for placement of a percutaneous cholecystostomy tube as well as potential percutaneous hepatic abscess drain placement. EXAM: CHOLECYSTOSTOMY MEDICATIONS: Patient is currently on intravenous antibiotics as an inpatient. No additional antibiotic coverage was required. ANESTHESIA/SEDATION: Moderate (conscious) sedation was employed during this procedure. A total of Versed 2 mg and Fentanyl 100 mcg was administered intravenously. Moderate Sedation Time: 13 minutes. The patient's level of consciousness and vital signs were monitored continuously by radiology nursing throughout the procedure under my direct supervision. FLUOROSCOPY TIME:  Fluoroscopy Time: 0 minutes 54 seconds (8 mGy). COMPLICATIONS: None immediate. PROCEDURE: Informed written consent was obtained from the patient after a thorough discussion of the procedural risks, benefits and alternatives. All questions were addressed. Maximal Sterile Barrier Technique was utilized including caps, mask, sterile gowns, sterile gloves, sterile drape, hand hygiene and skin antiseptic. A timeout was performed prior to the initiation of the procedure. The right upper quadrant was interrogated with ultrasound. There is a complex multilocular fluid collection involving hepatic segment 4 B, 4 a and 5 surrounding the gallbladder. The gallbladder itself is distended, thick-walled and highly irregular. A suitable skin entry site was selected and marked. Local anesthesia was attained by infiltration with 1% lidocaine. A small dermatotomy was made. Under real-time sonographic guidance, a 21 gauge trocar needle was advanced through a short transhepatic course and into the lumen of the inflamed gallbladder. A gentle hand injection of contrast material opacifies the gallbladder lumen. Of note, the roof of the gallbladder along the hepatic surface is completely discontinuous and contrast material extravasated into the abscess collection. A  0.018 wire was advanced into the gallbladder lumen. The trocar needle was removed. The transitional sheath was advanced over the wire and into the gallbladder lumen. The microwire was exchanged for a short Amplatz wire. The transhepatic tract was then dilated to 10 [place] and a Cook 10.2 [place] all-purpose drainage catheter was advanced over the wire and formed in the gallbladder lumen. Aspiration was then performed. A total of 250 mL of thick, foul-smelling purulent fluid and bile was successfully aspirated. Ultrasound was again used to interrogate the liver. The previously identified complex hepatic abscess has been completely successfully aspirated. There is no significant residual fluid collection in which to place a second drainage catheter. Therefore, the transhepatic percutaneous cholecystostomy tube was gently flushed, connected to gravity bag drainage and secured to the skin with 0 Prolene suture. The patient tolerated procedure well. A sample of aspirated purulent fluid will be sent for Gram stain and culture. IMPRESSION: 1. Successful placement of a transhepatic percutaneous drainage catheter for acute calculus and gangrenous cholecystitis with perforation and associated hepatic abscess. 2. The disrupted gallbladder lumen communicates freely with the intrahepatic abscess and all fluid was successfully aspirated through the percutaneous cholecystostomy tube. A second drainage catheter was not necessary. PLAN: 1. Maintain tube to gravity bag drainage. 2. Flush once per shift. 3.  Follow-up in interventional radiology for cholecystostomy tube check and possible exchange in 6-8 weeks. Signed, Sterling BigHeath K. McCullough, MD, RPVI [facility] Electronically Signed   By: Malachy MoanHeath  McCullough M.D.   On: 05/11/2018 10:39   Dg Chest Port 1 View  Result Date: 05/14/2018 CLINICAL DATA:  PICC line placement EXAM: PORTABLE CHEST 1 VIEW COMPARISON:  05/11/2018  FINDINGS: There is a right-sided PICC line with the tip projecting over the SVC. There is no focal parenchymal opacity. There is no pleural effusion or pneumothorax. There is mild bilateral interstitial prominence. There is stable cardiomegaly. There is evidence of prior CABG. The osseous structures are unremarkable. IMPRESSION: Right-sided PICC line with the tip projecting over the SVC. Electronically Signed   By: Elige KoHetal  Patel   On: 05/14/2018 11:58   [place] Ekg Site Rite  Result Date: 05/13/2018 If Site Rite image not attached, placement could not be confirmed due to current cardiac rhythm.   Labs:  CBC: Recent Labs    05/11/18 0257 05/12/18 0321 05/13/18 0243 05/15/18 0415  WBC 9.9 9.5 8.4 6.0  HGB 8.3* 8.3* 8.1* 7.9*  HCT 27.5* 27.2* 26.8* 26.4*  PLT 306 354 333 394    COAGS: Recent Labs    03/14/18 1610 03/26/18 1146 03/31/18 1458 04/08/18 0238 05/10/18 0907  INR 0.96 0.96 1.37 1.06 1.42  APTT 32 31 29  --   --     BMP: Recent Labs    05/12/18 0321 05/13/18 0243 05/14/18 0238 05/15/18 0415  NA 130* 130* 132* 135  K 4.1 4.1 3.7 3.7  CL 98 98 98 102  CO2 22 21* 22 24  GLUCOSE 151* 157* 128* 128*  BUN 11 13 15 12   CALCIUM 8.7* 8.9 8.8* 8.9  CREATININE 1.14 1.37* 1.43* 1.21  GFRNONAA >60 50* 47* 58*  GFRAA >60 58* 55* >60    LIVER FUNCTION TESTS: Recent Labs    05/10/18 0534 05/13/18 0243 05/14/18 0238 05/15/18 0415  BILITOT 0.5 0.5 0.5 0.5  AST 43* 37 30 25  ALT 64* 46* 36 30  ALKPHOS 186* 181* 155* 130*  PROT 6.3* 5.9* 5.7* 5.3*  ALBUMIN 2.3* 2.1* 2.1* 2.1*    Assessment and Plan:  Perc chole drain intact Home soon with drain Need to flush drain 5-10 cc sterile saline daily IR will call pt with time and date of OP Clinic appt for follow up   Electronically Signed: Robet LeuPamela A Eudell Mcphee, PA-C 05/15/2018, 9:48 AM   I spent a total of 15 Minutes at the the patient's bedside AND on the patient's hospital floor or unit, greater than 50% of which was  counseling/coordinating care for perc chole drain

## 2018-05-15 NOTE — Progress Notes (Signed)
[facility]   Reason for visit: Follow up on liver abscess  Interval History: drain remains in place and IR to follow up as outpatient in drain clinic; WBC wnl, afebrile.  Cultures noted and changed to ceftriaxone alone.  E coli and Klebsiella, only amp resistance.  Picc line placed.  Day 7 total antibiotics  Physical Exam: Constitutional:  Vitals:   05/15/18 0535 05/15/18 0849  BP: 111/86 112/69  Pulse: (!) 57 76  Resp: 18   Temp: 98.1 F (36.7 C)   SpO2: 98%    patient appears in NAD Eyes: anicteric Respiratory: Normal respiratory effort; CTA B Cardiovascular: RRR GI: soft, nt, nd Drains: in place  Review of Systems: Constitutional: negative for fevers, chills and weight loss Gastrointestinal: negative for nausea and diarrhea Integument/breast: negative for rash  Lab Results  Component Value Date   WBC 6.0 05/15/2018   HGB 7.9 (L) 05/15/2018   HCT 26.4 (L) 05/15/2018   MCV 87.7 05/15/2018   PLT 394 05/15/2018    Lab Results  Component Value Date   CREATININE 1.21 05/15/2018   BUN 12 05/15/2018   NA 135 05/15/2018   K 3.7 05/15/2018   CL 102 05/15/2018   CO2 24 05/15/2018    Lab Results  Component Value Date   ALT 30 05/15/2018   AST 25 05/15/2018   ALKPHOS 130 (H) 05/15/2018     Microbiology: Recent Results (from the past 240 hour(s))  Aerobic/Anaerobic Culture (surgical/deep wound)     Status: None (Preliminary result)   Collection Time: 05/11/18 10:28 AM  Result Value Ref Range Status   Specimen Description ABSCESS LIVER  Final   Special Requests Normal  Final   Gram Stain   Final    MODERATE WBC PRESENT, PREDOMINANTLY PMN MODERATE GRAM NEGATIVE RODS Performed at [facility], [address], [place], [place] [address]    Culture   Final    MODERATE ESCHERICHIA COLI ABUNDANT KLEBSIELLA OXYTOCA CULTURE REINCUBATED FOR BETTER GROWTH NO ANAEROBES ISOLATED; CULTURE IN PROGRESS FOR 5 DAYS    Report Status  PENDING  Incomplete   Organism ID, Bacteria ESCHERICHIA COLI  Final   Organism ID, Bacteria KLEBSIELLA OXYTOCA  Final      Susceptibility   Escherichia coli - MIC*    AMPICILLIN <=2 SENSITIVE Sensitive     CEFAZOLIN <=4 SENSITIVE Sensitive     CEFEPIME <=1 SENSITIVE Sensitive     CEFTAZIDIME <=1 SENSITIVE Sensitive     CEFTRIAXONE <=1 SENSITIVE Sensitive     CIPROFLOXACIN <=0.25 SENSITIVE Sensitive     GENTAMICIN <=1 SENSITIVE Sensitive     IMIPENEM <=0.25 SENSITIVE Sensitive     TRIMETH/SULFA <=20 SENSITIVE Sensitive     AMPICILLIN/SULBACTAM <=2 SENSITIVE Sensitive     PIP/TAZO <=4 SENSITIVE Sensitive     Extended ESBL NEGATIVE Sensitive     * MODERATE ESCHERICHIA COLI   Klebsiella oxytoca - MIC*    AMPICILLIN >=32 RESISTANT Resistant     CEFAZOLIN 8 SENSITIVE Sensitive     CEFEPIME <=1 SENSITIVE Sensitive     CEFTAZIDIME <=1 SENSITIVE Sensitive     CEFTRIAXONE <=1 SENSITIVE Sensitive     CIPROFLOXACIN <=0.25 SENSITIVE Sensitive     GENTAMICIN <=1 SENSITIVE Sensitive     IMIPENEM <=0.25 SENSITIVE Sensitive     TRIMETH/SULFA <=20 SENSITIVE Sensitive     AMPICILLIN/SULBACTAM 4 SENSITIVE Sensitive     PIP/TAZO <=4 SENSITIVE Sensitive     Extended ESBL NEGATIVE Sensitive     *  ABUNDANT KLEBSIELLA OXYTOCA    Impression/Plan:  1. Liver abscess - recent cholecystitis with abscess.  Will continue with ceftriaxone for about 4 weeks and reassess.  Now day 7.  Will follow in drain clinic with IR.    He has an appt with me February 6 at 10:45.  Continue IV ceftriaxone through February 6th.   Do not pull picc line until seen by ID MD May consider continuing with Augmentin at that time.    2.  Cholecystitis - plan for lap chole after March 1st.    3.  AKI - improved today and creat wnl.  No antibiotic adjustment indicated.    I will sign off, thanks for consultation

## 2018-05-15 NOTE — Plan of Care (Signed)
  Problem: Clinical Measurements: Goal: Ability to maintain clinical measurements within normal limits will improve Outcome: Progressing   Problem: Clinical Measurements: Goal: Diagnostic test results will improve Outcome: Progressing   Problem: Activity: Goal: Risk for activity intolerance will decrease Outcome: Progressing   Problem: Nutrition: Goal: Adequate nutrition will be maintained Outcome: Progressing   Problem: Coping: Goal: Level of anxiety will decrease Outcome: Progressing   Problem: Pain Managment: Goal: General experience of comfort will improve Outcome: Progressing

## 2018-05-15 NOTE — Progress Notes (Signed)
Refused drain site dressing changed rt now.

## 2018-05-15 NOTE — Progress Notes (Signed)
[name] to be D/C'd  per MD order. Discussed with the patient and all questions fully answered.  VSS, Skin clean, dry and intact without evidence of skin break down, no evidence of skin tears noted.  IV catheter discontinued intact. Site without signs and symptoms of complications. Dressing and pressure applied.  An After Visit Summary was printed and given to the patient. Patient received prescription.  D/c education completed with patient/family including follow up instructions, medication list, d/c activities limitations if indicated, with other d/c instructions as indicated by MD - patient able to verbalize understanding, all questions fully answered.   Patient instructed to return to ED, call 911, or call MD for any changes in condition.   Patient to be escorted via WC, and D/C home via private auto. 

## 2018-05-15 NOTE — Progress Notes (Signed)
PHARMACY CONSULT NOTE FOR:  OUTPATIENT  PARENTERAL ANTIBIOTIC THERAPY (OPAT)  Indication: Liver Abscess Regimen: Ceftriaxone 2 gm every 24 hours End date: 06/05/2018  IV antibiotic discharge orders are completed.      Thank you for allowing pharmacy to be a part of this patient's care.  Sharin Mons, PharmD, BCPS, BCIDP Infectious Diseases Clinical Pharmacist Phone: [phone] 05/15/2018, 12:45 PM

## 2018-05-15 NOTE — Progress Notes (Signed)
Nutrition Follow-up  DOCUMENTATION CODES:   Severe malnutrition in context of acute illness/injury, Obesity unspecified  INTERVENTION:   -D/c Boost Breeze po TID, each supplement provides 250 kcal and 9 grams of protein -MVI with minerals daily -Glucerna Shake po TID, each supplement provides 220 kcal and 10 grams of protein  NUTRITION DIAGNOSIS:   Severe Malnutrition related to acute illness(hepatic abscess) as evidenced by energy intake < or equal to 50% for > or equal to 5 days, percent weight loss(11% weight loss within 1 month).  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  1/12- perc cholecystostomy tube placed by IR  Reviewed I/O's: +1.1 L x 24 hours and +6.2 L since admission  Drain output: 60 ml x 24 hours  Spoke with pt and daughter at bedside. Both are in good spirits today. Pt reports that he is likely to be discharged later this afternoon. Both pt and daughter reports appetite has improved since admission ("I used to only eat a bite of food and be full, now I can eat most of my food"). Pt shares that he hate most of his breakfast and 100% of his chicken pot pie for dinner. He is craving a [employer] hamburger, chili, and frosty.   Discussed with pt importance of good meal and supplement intake to promote healing. Pt reports Boost Cleotis Nipper is "nasty, but can drink Glucerna. He plans on consuming Premier Protein or Glucerna at home; RD reinforced importance of this plan.   Labs reviewed: CBGS: 125-189 (inpatient orders for glycemic control are 0-15 units insulin aspart TID with meals, 0-5 units insulin  aspart q HS).   Diet Order:   Diet Order            Diet heart healthy/carb modified Room service appropriate? Yes; Fluid consistency: Thin  Diet effective now              EDUCATION NEEDS:   No education needs have been identified at this time  Skin:  Skin Assessment: Skin Integrity Issues: Skin Integrity Issues:: Other (Comment) Other: pressure injury to coccyx, MASD  Last BM:  1/10  Height:   Ht Readings from Last 1 Encounters:  05/09/18 5\' 11"  (1.803 m)    Weight:   Wt Readings from Last 1 Encounters:  05/14/18 113.5 kg    Ideal Body Weight:  78.2 kg  BMI:  Body mass index is 34.9 kg/m.  Estimated Nutritional Needs:   Kcal:  2200-2400  Protein:  120-140 gm  Fluid:  2.2-2.4 L    Etha Stambaugh A. Mayford Knife, RD, LDN, CDE Pager: [phone] After hours Pager: [phone]

## 2018-05-15 NOTE — Discharge Summary (Signed)
Physician Discharge Summary  [name]:[id number] DOB: 10/02/1942 DOA: 05/09/2018  PCP: Guadlupe Spanish, MD  Admit date: 05/09/2018 Discharge date: 05/15/2018  Admitted From: Home Disposition: Home  Recommendations for Outpatient Follow-up:  1. Follow up with PCP in 1 week 2. Follow up with general surgery Dr. Barry Dienes in 4 to 6 weeks for hepatic resection, cholecystectomy 3. Follow-up with interventional radiology in about 6 weeks for drain evaluation/CT. IR will coordinate and call with follow-up. 4. Follow-up with cardiothoracic surgery Dr. Prescott Gum on 2/5 5. Continue Rocephin for 4 weeks.  Follow-up with infectious disease February 6, continue Rocephin through February 6, do not pull PICC line until seen by ID 6. Please repeat CBC, BMP, LFT as an outpatient.  Patient's Lasix has been held due to acute kidney injury, statin on hold due to elevated liver enzymes.  This should be monitored as outpatient and resumed per primary care physician.  Discharge Condition: Stable CODE STATUS: Full  Diet recommendation: Heart healthy, carb modified diet  Brief/Interim Summary: [name] is a 73 year old male with history of CAD, recent CABG, A Flutter (recent) on Eliquis, type 2 diabetes mellitus, severe protein calorie malnutrition, hyperlipidemia with prior history of liver abscess presented with weakness and fatigue, poor p.o. intake and nausea with findings of 12 cm abscess in the gallbladder fossa and the liver. Patient had a placement of a 10 F percutaneous cholecystostomy tube by IR on 1/12.A second hepatic abscess drain was not needed as the perforated gallbladder communicated with the abscess.>250 mL purulent fluid was aspirated.   Culture returned with E. coli and Klebsiella.  PICC line was placed, patient was discharged home with IV Rocephin and to follow-up with infectious disease as an outpatient.  Liver abscess Status post percutaneous cholecystostomy drain on 1/12.  Patient needs to follow with Dr. Barry Dienes in 4-6 weeks for hepatic resection, cholecystectomy. IR will schedule follow up appointment in approximately 6 weeks for drain evaluation/CT. Continue daily flushes with 5-10cc NS and record output daily.   CAD with recent CABG December 2019 Appreciate cardiothoracic surgery. Aspirin on hold currently. Follow up with Cardiothoracic surgery   Acute kidney injury, likely prerenal Creatinine improved.  Continue to hold Lasix  Atrial flutter Rate controlled, continue lopressor, Eliquis, amiodarone  Iron deficiency anemia Continue iron supplementation, had normal colonoscopy 2 years ago at [facility]  Non-insulin-dependent type 2 diabetes Metformin on discharge  Severe protein calorie malnutrition Continue supplementation  Elevated liver enzymes Statin on hold, liver enzymes trending down   Discharge Instructions  Discharge Instructions    Call MD for:  difficulty breathing, headache or visual disturbances   Complete by:  As directed    Call MD for:  extreme fatigue   Complete by:  As directed    Call MD for:  hives   Complete by:  As directed    Call MD for:  persistant dizziness or light-headedness   Complete by:  As directed    Call MD for:  persistant nausea and vomiting   Complete by:  As directed    Call MD for:  redness, tenderness, or signs of infection (pain, swelling, redness, odor or green/yellow discharge around incision site)   Complete by:  As directed    Call MD for:  severe uncontrolled pain   Complete by:  As directed    Call MD for:  temperature >100.4   Complete by:  As directed    Diet - low sodium heart healthy   Complete by:  As directed    Discharge instructions   Complete by:  As directed    You were cared for by a hospitalist during your hospital stay. If you have any questions about your discharge medications or the care you received while you were in the hospital after you are discharged, you can call the  unit and ask to speak with the hospitalist on call if the hospitalist that took care of you is not available. Once you are discharged, your primary care physician will handle any further medical issues. Please note that NO REFILLS for any discharge medications will be authorized once you are discharged, as it is imperative that you return to your primary care physician (or establish a relationship with a primary care physician if you do not have one) for your aftercare needs so that they can reassess your need for medications and monitor your lab values.   Home infusion instructions [facility] May follow [place] Dosing Protocol; May administer Cathflo as needed to maintain patency of vascular access device.; Flushing of vascular access device: per [facility] Protocol: 0.9% NaCl pre/post medica...   Complete by:  As directed    Instructions:  May follow [place] Dosing Protocol   Instructions:  May administer Cathflo as needed to maintain patency of vascular access device.   Instructions:  Flushing of vascular access device: per [facility] Protocol: 0.9% NaCl pre/post medication administration and prn patency; Heparin 100 u/ml, 108m for implanted ports and Heparin 10u/ml, 565mfor all other central venous catheters.   Instructions:  May follow [facility] Anaphylaxis Protocol for First Dose Administration in the home: 0.9% NaCl at 25-50 ml/hr to maintain IV access for protocol meds. Epinephrine 0.3 ml IV/IM PRN and Benadryl 25-50 IV/IM PRN s/s of anaphylaxis.   Instructions:  [facility] Coordinator (RN) to assist per patient IV care needs in the home PRN.   Increase activity slowly   Complete by:  As directed      Allergies as of 05/15/2018      Reactions   Ciprofloxacin Rash   Erythromycin Ethylsuccinate Rash      Medication List    STOP taking these medications   furosemide 40 MG tablet Commonly known as:  LASIX   Potassium Chloride ER 20 MEQ Tbcr   potassium chloride SA 20 MEQ  tablet Commonly known as:  K-DUR,KLOR-CON   pravastatin 40 MG tablet Commonly known as:  PRAVACHOL     TAKE these medications   acetaminophen 500 MG tablet Commonly known as:  TYLENOL Take 1,000 mg by mouth daily as needed for moderate pain.   amiodarone 200 MG tablet Commonly known as:  PACERONE Take 1 tablet (200 mg total) by mouth daily.   amLODipine 5 MG tablet Commonly known as:  NORVASC Take 5 mg by mouth daily.   apixaban 5 MG Tabs tablet Commonly known as:  ELIQUIS Take 1 tablet (5 mg total) by mouth 2 (two) times daily.   aspirin EC 81 MG tablet Take 1 tablet (81 mg total) by mouth daily.   cefTRIAXone  IVPB Commonly known as:  ROCEPHIN Inject 2 g into the vein daily for 21 days. Indication:  Liver Abscess Last Day of Therapy:  06/05/2018 Labs - Once weekly:  CBC/D and BMP, Labs - Every other week:  ESR and CRP   docusate sodium 100 MG capsule Commonly known as:  COLACE Take 100 mg by mouth 2 (two) times daily.   finasteride 5 MG tablet Commonly known  as:  PROSCAR Take 5 mg by mouth daily.   levalbuterol 0.31 MG/3ML nebulizer solution Commonly known as:  XOPENEX Take 1 ampule by nebulization every 6 (six) hours as needed (COUGH OR DYSPNEA).   loratadine 10 MG tablet Commonly known as:  CLARITIN Take 10 mg by mouth daily.   metFORMIN 1000 MG tablet Commonly known as:  GLUCOPHAGE Take 1,000 mg by mouth 2 (two) times daily.   metoprolol tartrate 25 MG tablet Commonly known as:  LOPRESSOR Take 1 tablet (25 mg total) by mouth 2 (two) times daily.   MULTIVITAMIN ADULT PO Take 1 tablet by mouth daily.   oxyCODONE 5 MG immediate release tablet Commonly known as:  Oxy IR/ROXICODONE Take 1-3 tablets (5-15 mg total) by mouth every 6 (six) hours as needed for up to 5 days for moderate pain. What changed:    how much to take  how to take this  when to take this  reasons to take this  additional instructions   sodium chloride flush 0.9 %  Soln Commonly known as:  NS Use 5-16m to flush chole drain daily   Vitamin D3 50 MCG (2000 UT) Tabs Take 2,000 Units by mouth 2 (two) times daily.            Home Infusion Instuctions  (From admission, onward)         Start     Ordered   05/15/18 0000  Home infusion instructions [facility] May follow [place] Protocol; May administer Cathflo as needed to maintain patency of vascular access device.; Fl[facility]: [facility] NaCl pre/post medica...    Question Answer Comment  Instructions May follow [place] Protocol   Instructions May administer Cathflo as needed to maintain patency of vascular access device.   Instructions Flushing of vascular access device: [facility]: 0.9% NaCl pre/post medication administration and prn patency; Heparin 100 u/ml, 5438mfor implanted ports and Heparin 10u/ml, 38m74mor all other central venous catheters.   Instructions May follow [facility] Anaphylaxis Protocol for First Dose Administration in the home: 0.9% NaCl at 25-50 ml/hr to maintain IV access for protocol meds. Epinephrine 0.3 ml IV/IM PRN and Benadryl 25-50 IV/IM PRN s/s of anaphylaxis.   Instructions [facility] Infusion Coordinator (RN) to assist per patient IV care needs in the home PRN.      05/15/18 1249         Follow-up Information    ByeStark KleinD. Call.   Specialty:  General Surgery Why:  Call and schedule a follow up appointment to be seen in 2-3 weeks.  Contact information: [phone]        [facility] up.   Why:  IR scheduler will call you with appointment date and time Contact information: [phone]        [facility] - Dme Follow up.   Contact information: [phone] [phone]     ArvGuadlupe SpanishD.  Schedule an appointment as soon as possible for a visit in 1 week(s).   Specialty:  Internal Medicine Contact information: [phone]     VanIvin PootD. Go on 06/04/2018.   Specialty:  Cardiothoracic Surgery Contact information: [address]  [place] [address] [phone]        Thayer Headings, MD. Go on 06/05/2018.   Specialty:  Infectious Diseases Contact information: [address] [place] [address] [phone]          Allergies  Allergen Reactions  . Ciprofloxacin Rash  . Erythromycin Ethylsuccinate Rash    Consultations:  General Surgery  ID  IR  CTS    Procedures/Studies: Dg Chest 2 View  Result Date: 05/11/2018 CLINICAL DATA:  Shortness of breath EXAM: CHEST - 2 VIEW COMPARISON:  05/09/2018 FINDINGS: Lungs are clear.  No pleural effusion or pneumothorax. The heart is top-normal in size. Postsurgical changes related to prior CABG. Mild degenerative changes of the visualized thoracolumbar spine. Median sternotomy. IMPRESSION: Normal chest radiographs. Electronically Signed   By: Julian Hy M.D.   On: 05/11/2018 21:36   Dg Chest 2 View  Result Date: 05/05/2018 CLINICAL DATA:  Dry cough and shortness of breath.  Recent CABG. EXAM: CHEST - 2 VIEW COMPARISON:  Chest x-ray dated April 10, 2018. FINDINGS: Stable mild cardiomegaly status post CABG. Normal pulmonary vascularity. Unchanged peripheral scarring in the left mid lung. New trace left pleural effusion. The right lung is clear. No pneumothorax. No acute osseous abnormality. IMPRESSION: 1. New trace left pleural effusion. Electronically Signed   By: Titus Dubin M.D.   On: 05/05/2018 11:05   Ct Abdomen Pelvis W Contrast  Result Date: 05/09/2018 CLINICAL DATA:  Abdominal pain, acute, generalized. Decreased appetite with weakness and nausea. Recent fever. EXAM: CT ABDOMEN AND PELVIS WITH CONTRAST TECHNIQUE:  Multidetector CT imaging of the abdomen and pelvis was performed using the standard protocol following bolus administration of intravenous contrast. CONTRAST:  163m OMNIPAQUE IOHEXOL 300 MG/ML  SOLN COMPARISON:  CT abdomen pelvis 10/26/2016. FINDINGS: Lower chest: Lung bases are clear without focal nodule, mass, or airspace disease. Heart is mildly enlarged. Coronary artery calcifications are present. Patient is status post CABG. Hepatobiliary: There is diffuse fatty infiltration liver. New ill-defined multiloculated collection at the gallbladder fossa is concerning for complicated cholecystitis and hepatic abscess. Although there is no definite solid component. Neoplasm is not excluded. No significant intrahepatic biliary dilation or adenopathy is present. The common bile duct is otherwise normal. There are some inflammatory changes adjacent. Pancreas: Unremarkable. No pancreatic ductal dilatation or surrounding inflammatory changes. Spleen: Normal in size without focal abnormality. 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 The adrenal glands are otherwise normal. A 12 mm cyst is present in the midportion left kidney. An exophytic cyst at the lower pole of the left kidney measures 26 mm. Additional 12 mm parenchymal cyst is present near the lower pole of the left kidney is well. No focal lesions are present in the right kidney. There is no stone or hydronephrosis. Ureters are within normal limits bilaterally. The urinary bladder is normal. Stomach/Bowel: Stomach is within normal limits. Inflammatory changes are present in the second portion the duodenum, adjacent to the gallbladder. Distal duodenum and small bowel are within normal limits. Surgery of the ascending colon is noted. Is some secondary inflammatory change at the hepatic flexure. Transverse colon is within normal limits. The distal descending colon demonstrates diverticular changes. Distal diverticular changes  extend into the sigmoid colon or mild inflammatory changes are noted. There is no abscess or free air. Vascular/Lymphatic: Atherosclerotic calcifications are present in the aorta and branch vessels without aneurysm. No significant adenopathy is present. Reproductive: Prostate is unremarkable. Other: No abdominal wall hernia  or abnormality. No abdominopelvic ascites. Musculoskeletal: Chronic endplate changes are most evident at L5-4 5 and to a lesser extent L3-4. Vertebral body heights are normal. Pelvis is unremarkable. Hips are located and within normal limits bilaterally. IMPRESSION: 1. Ill-defined multiloculated collection at the gallbladder fossa measures 12.5 x 10.2 x 7.0 cm. This most likely represents complicated cholecystitis and hepatic abscess. Secondary inflammatory changes are present in the duodenum and colon. Gallbladder neoplasm is considered less likely. 2. Descending and sigmoid colonic diverticular disease with mild inflammatory changes in the sigmoid consistent with early diverticulitis. This likely accounts for the left lower quadrant pain. 3.  Aortic Atherosclerosis (ICD10-I70.0). 4. Multiple left renal cysts. Electronically Signed   By: San Morelle M.D.   On: 05/09/2018 16:54   Ir Perc Cholecystostomy  Result Date: 05/11/2018 INDICATION: 73 year old male with acute gangrenous cholecystitis and associated multilocular hepatic abscess. He presents for placement of a percutaneous cholecystostomy tube as well as potential percutaneous hepatic abscess drain placement. EXAM: CHOLECYSTOSTOMY MEDICATIONS: Patient is currently on intravenous antibiotics as an inpatient. No additional antibiotic coverage was required. ANESTHESIA/SEDATION: Moderate (conscious) sedation was employed during this procedure. A total of Versed 2 mg and Fentanyl 100 mcg was administered intravenously. Moderate Sedation Time: 13 minutes. The patient's level of consciousness and vital signs were monitored  continuously by radiology nursing throughout the procedure under my direct supervision. FLUOROSCOPY TIME:  Fluoroscopy Time: 0 minutes 54 seconds (8 mGy). COMPLICATIONS: None immediate. PROCEDURE: Informed written consent was obtained from the patient after a thorough discussion of the procedural risks, benefits and alternatives. All questions were addressed. Maximal Sterile Barrier Technique was utilized including caps, mask, sterile gowns, sterile gloves, sterile drape, hand hygiene and skin antiseptic. A timeout was performed prior to the initiation of the procedure. The right upper quadrant was interrogated with ultrasound. There is a complex multilocular fluid collection involving hepatic segment 4 B, 4 a and 5 surrounding the gallbladder. The gallbladder itself is distended, thick-walled and highly irregular. A suitable skin entry site was selected and marked. Local anesthesia was attained by infiltration with 1% lidocaine. A small dermatotomy was made. Under real-time sonographic guidance, a 21 gauge trocar needle was advanced through a short transhepatic course and into the lumen of the inflamed gallbladder. A gentle hand injection of contrast material opacifies the gallbladder lumen. Of note, the roof of the gallbladder along the hepatic surface is completely discontinuous and contrast material extravasated into the abscess collection. A 0.018 wire was advanced into the gallbladder lumen. The trocar needle was removed. The transitional sheath was advanced over the wire and into the gallbladder lumen. The microwire was exchanged for a short Amplatz wire. The transhepatic tract was then dilated to 10 [place] and a Cook 10.2 [place] all-purpose drainage catheter was advanced over the wire and formed in the gallbladder lumen. Aspiration was then performed. A total of 250 mL of thick, foul-smelling purulent fluid and bile was successfully aspirated. Ultrasound was again used to interrogate the liver. The  previously identified complex hepatic abscess has been completely successfully aspirated. There is no significant residual fluid collection in which to place a second drainage catheter. Therefore, the transhepatic percutaneous cholecystostomy tube was gently flushed, connected to gravity bag drainage and secured to the skin with 0 Prolene suture. The patient tolerated procedure well. A sample of aspirated purulent fluid will be sent for Gram stain and culture. IMPRESSION: 1. Successful placement of a transhepatic percutaneous drainage catheter for acute calculus and gangrenous cholecystitis with perforation and associated  hepatic abscess. 2. The disrupted gallbladder lumen communicates freely with the intrahepatic abscess and all fluid was successfully aspirated through the percutaneous cholecystostomy tube. A second drainage catheter was not necessary. PLAN: 1. Maintain tube to gravity bag drainage. 2. Flush once per shift. 3. Follow-up in interventional radiology for cholecystostomy tube check and possible exchange in 6-8 weeks. Signed, Criselda Peaches, MD, [facility] Electronically Signed   By: Jacqulynn Cadet M.D.   On: 05/11/2018 10:39   Dg Chest Port 1 View  Result Date: 05/14/2018 CLINICAL DATA:  PICC line placement EXAM: PORTABLE CHEST 1 VIEW COMPARISON:  05/11/2018 FINDINGS: There is a right-sided PICC line with the tip projecting over the SVC. There is no focal parenchymal opacity. There is no pleural effusion or pneumothorax. There is mild bilateral interstitial prominence. There is stable cardiomegaly. There is evidence of prior CABG. The osseous structures are unremarkable. IMPRESSION: Right-sided PICC line with the tip projecting over the SVC. Electronically Signed   By: Kathreen Devoid   On: 05/14/2018 11:58   Dg Chest Port 1 View  Result Date: 05/09/2018 CLINICAL DATA:  Cough. EXAM: PORTABLE CHEST 1 VIEW COMPARISON:   05/05/2018 and 04/10/2018 FINDINGS: The heart size and pulmonary vascularity are normal. CABG. Slight scarring in the left midzone and left base. The lungs are otherwise clear. No visible effusions. No discrete bone abnormality. IMPRESSION: No acute cardiopulmonary abnormalities. Electronically Signed   By: Lorriane Shire M.D.   On: 05/09/2018 11:44   [place] Ekg Site Rite  Result Date: 05/13/2018 If Site Rite image not attached, placement could not be confirmed due to current cardiac rhythm.      Discharge Exam: Vitals:   05/15/18 0535 05/15/18 0849  BP: 111/86 112/69  Pulse: (!) 57 76  Resp: 18   Temp: 98.1 F (36.7 C)   SpO2: 98%     General: Pt is alert, awake, not in acute distress Cardiovascular: RRR, S1/S2 +, no rubs, no gallops Respiratory: CTA bilaterally, no wheezing, no rhonchi Abdominal: Soft, NT, ND, bowel sounds +chole drain with dark serosanguinous fluid  Extremities: no edema, no cyanosis    The results of significant diagnostics from this hospitalization (including imaging, microbiology, ancillary and laboratory) are listed below for reference.     Microbiology: Recent Results (from the past 240 hour(s))  Aerobic/Anaerobic Culture (surgical/deep wound)     Status: None (Preliminary result)   Collection Time: 05/11/18 10:28 AM  Result Value Ref Range Status   Specimen Description ABSCESS LIVER  Final   Special Requests Normal  Final   Gram Stain   Final    MODERATE WBC PRESENT, PREDOMINANTLY PMN MODERATE GRAM NEGATIVE RODS Performed at [facility], [address] [place],  [address]    Culture   Final    MODERATE ESCHERICHIA COLI ABUNDANT KLEBSIELLA OXYTOCA CULTURE REINCUBATED FOR BETTER GROWTH NO ANAEROBES ISOLATED; CULTURE IN PROGRESS FOR 5 DAYS    Report Status PENDING  Incomplete   Organism ID, Bacteria ESCHERICHIA COLI  Final   Organism ID, Bacteria KLEBSIELLA OXYTOCA  Final      Susceptibility   Escherichia coli - MIC*     AMPICILLIN <=2 SENSITIVE Sensitive     CEFAZOLIN <=4 SENSITIVE Sensitive     CEFEPIME <=1 SENSITIVE Sensitive     CEFTAZIDIME <=1 SENSITIVE Sensitive     CEFTRIAXONE <=1 SENSITIVE Sensitive     CIPROFLOXACIN <=0.25 SENSITIVE Sensitive     GENTAMICIN <=1 SENSITIVE Sensitive  IMIPENEM <=0.25 SENSITIVE Sensitive     TRIMETH/SULFA <=20 SENSITIVE Sensitive     AMPICILLIN/SULBACTAM <=2 SENSITIVE Sensitive     PIP/TAZO <=4 SENSITIVE Sensitive     Extended ESBL NEGATIVE Sensitive     * MODERATE ESCHERICHIA COLI   Klebsiella oxytoca - MIC*    AMPICILLIN >=32 RESISTANT Resistant     CEFAZOLIN 8 SENSITIVE Sensitive     CEFEPIME <=1 SENSITIVE Sensitive     CEFTAZIDIME <=1 SENSITIVE Sensitive     CEFTRIAXONE <=1 SENSITIVE Sensitive     CIPROFLOXACIN <=0.25 SENSITIVE Sensitive     GENTAMICIN <=1 SENSITIVE Sensitive     IMIPENEM <=0.25 SENSITIVE Sensitive     TRIMETH/SULFA <=20 SENSITIVE Sensitive     AMPICILLIN/SULBACTAM 4 SENSITIVE Sensitive     PIP/TAZO <=4 SENSITIVE Sensitive     Extended ESBL NEGATIVE Sensitive     * ABUNDANT KLEBSIELLA OXYTOCA     Labs: BNP (last 3 results) No results for input(s): BNP in the last 8760 hours. Basic Metabolic Panel: Recent Labs  Lab 05/11/18 0257 05/12/18 0321 05/13/18 0243 05/14/18 0238 05/15/18 0415  NA 131* 130* 130* 132* 135  K 3.8 4.1 4.1 3.7 3.7  CL 98 98 98 98 102  CO2 22 22 21* 22 24  GLUCOSE 145* 151* 157* 128* 128*  BUN '11 11 13 15 12  '$ CREATININE 1.10 1.14 1.37* 1.43* 1.21  CALCIUM 8.6* 8.7* 8.9 8.8* 8.9   Liver Function Tests: Recent Labs  Lab 05/09/18 1108 05/10/18 0534 05/13/18 0243 05/14/18 0238 05/15/18 0415  AST 82* 43* 37 30 25  ALT 86* 64* 46* 36 30  ALKPHOS 228* 186* 181* 155* 130*  BILITOT 0.9 0.5 0.5 0.5 0.5  PROT 6.6 6.3* 5.9* 5.7* 5.3*  ALBUMIN 2.4* 2.3* 2.1* 2.1* 2.1*   Recent Labs  Lab 05/09/18 1108  LIPASE 42   No results for input(s): AMMONIA in the last 168 hours. CBC: Recent Labs  Lab  05/10/18 0534 05/11/18 0257 05/12/18 0321 05/13/18 0243 05/15/18 0415  WBC 10.9* 9.9 9.5 8.4 6.0  NEUTROABS  --  7.8* 7.0  --   --   HGB 9.0* 8.3* 8.3* 8.1* 7.9*  HCT 29.4* 27.5* 27.2* 26.8* 26.4*  MCV 85.0 85.4 86.6 85.9 87.7  PLT 319 306 354 333 394   Cardiac Enzymes: No results for input(s): CKTOTAL, CKMB, CKMBINDEX, TROPONINI in the last 168 hours. BNP: Invalid input(s): POCBNP CBG: Recent Labs  Lab 05/14/18 1212 05/14/18 1729 05/14/18 2317 05/15/18 0820 05/15/18 1221  GLUCAP 189* 125* 126* 133* 120*   D-Dimer No results for input(s): DDIMER in the last 72 hours. Hgb A1c No results for input(s): HGBA1C in the last 72 hours. Lipid Profile No results for input(s): CHOL, HDL, LDLCALC, TRIG, CHOLHDL, LDLDIRECT in the last 72 hours. Thyroid function studies No results for input(s): TSH, T4TOTAL, T3FREE, THYROIDAB in the last 72 hours.  Invalid input(s): FREET3 Anemia work up Recent Labs    05/12/18 1500  TIBC 190*  IRON 12*   Urinalysis    Component Value Date/Time   COLORURINE YELLOW 05/09/2018 1310   APPEARANCEUR HAZY (A) 05/09/2018 1310   LABSPEC 1.014 05/09/2018 1310   PHURINE 5.0 05/09/2018 1310   GLUCOSEU NEGATIVE 05/09/2018 1310   HGBUR NEGATIVE 05/09/2018 [place] 05/09/2018 1310   KETONESUR NEGATIVE 05/09/2018 1310   PROTEINUR NEGATIVE 05/09/2018 1310   NITRITE NEGATIVE 05/09/2018 1310   LEUKOCYTESUR NEGATIVE 05/09/2018 1310   Sepsis Labs Invalid input(s): PROCALCITONIN,  WBC,  [place] Microbiology Recent Results (from the past 240 hour(s))  Aerobic/Anaerobic Culture (surgical/deep wound)     Status: None (Preliminary result)   Collection Time: 05/11/18 10:28 AM  Result Value Ref Range Status   Specimen Description ABSCESS LIVER  Final   Special Requests Normal  Final   Gram Stain   Final    MODERATE WBC PRESENT, PREDOMINANTLY PMN MODERATE GRAM NEGATIVE RODS Performed at [facility], [address],  [place], [place] [address]    Culture   Final    MODERATE ESCHERICHIA COLI ABUNDANT KLEBSIELLA OXYTOCA CULTURE REINCUBATED FOR BETTER GROWTH NO ANAEROBES ISOLATED; CULTURE IN PROGRESS FOR 5 DAYS    Report Status PENDING  Incomplete   Organism ID, Bacteria ESCHERICHIA COLI  Final   Organism ID, Bacteria KLEBSIELLA OXYTOCA  Final      Susceptibility   Escherichia coli - MIC*    AMPICILLIN <=2 SENSITIVE Sensitive     CEFAZOLIN <=4 SENSITIVE Sensitive     CEFEPIME <=1 SENSITIVE Sensitive     CEFTAZIDIME <=1 SENSITIVE Sensitive     CEFTRIAXONE <=1 SENSITIVE Sensitive     CIPROFLOXACIN <=0.25 SENSITIVE Sensitive     GENTAMICIN <=1 SENSITIVE Sensitive     IMIPENEM <=0.25 SENSITIVE Sensitive     TRIMETH/SULFA <=20 SENSITIVE Sensitive     AMPICILLIN/SULBACTAM <=2 SENSITIVE Sensitive     PIP/TAZO <=4 SENSITIVE Sensitive     Extended ESBL NEGATIVE Sensitive     * MODERATE ESCHERICHIA COLI   Klebsiella oxytoca - MIC*    AMPICILLIN >=32 RESISTANT Resistant     CEFAZOLIN 8 SENSITIVE Sensitive     CEFEPIME <=1 SENSITIVE Sensitive     CEFTAZIDIME <=1 SENSITIVE Sensitive     CEFTRIAXONE <=1 SENSITIVE Sensitive     CIPROFLOXACIN <=0.25 SENSITIVE Sensitive     GENTAMICIN <=1 SENSITIVE Sensitive     IMIPENEM <=0.25 SENSITIVE Sensitive     TRIMETH/SULFA <=20 SENSITIVE Sensitive     AMPICILLIN/SULBACTAM 4 SENSITIVE Sensitive     PIP/TAZO <=4 SENSITIVE Sensitive     Extended ESBL NEGATIVE Sensitive     * ABUNDANT KLEBSIELLA OXYTOCA     Patient was seen and examined on the day of discharge and was found to be in stable condition. Time coordinating discharge: 40 minutes including assessment and coordination of care, as well as examination of the patient.   SIGNED:  Dessa Phi, DO [facility] [email or web address] 05/15/2018, 12:55 PM

## 2018-05-16 ENCOUNTER — Other Ambulatory Visit: Payer: Self-pay | Admitting: General Surgery

## 2018-05-16 DIAGNOSIS — K819 Cholecystitis, unspecified: Secondary | ICD-10-CM

## 2018-05-16 DIAGNOSIS — K750 Abscess of liver: Secondary | ICD-10-CM

## 2018-05-16 LAB — AEROBIC/ANAEROBIC CULTURE W GRAM STAIN (SURGICAL/DEEP WOUND): Special Requests: NORMAL

## 2018-05-17 ENCOUNTER — Other Ambulatory Visit: Payer: Self-pay | Admitting: Cardiothoracic Surgery

## 2018-05-19 ENCOUNTER — Telehealth: Payer: Self-pay | Admitting: Cardiovascular Disease

## 2018-05-19 MED ORDER — AMIODARONE HCL 200 MG PO TABS
200.0000 mg | Freq: Every day | ORAL | 0 refills | Status: DC
Start: 2018-05-19 — End: 2018-07-04

## 2018-05-19 NOTE — Telephone Encounter (Signed)
New message        *STAT* If patient is at the pharmacy, call can be transferred to refill team.   1. Which medications need to be refilled? (please list name of each medication and dose if known)   amiodarone (PACERONE) 200 MG tablet  Metoprolol 25 mg  1 pill 2x a day    2. Which pharmacy/location (including street and city if local pharmacy) is medication to be sent to?[facility] - [place], [place] [address] - [address]  3. Do they need a 30 day or 90 day supply? 30 but wants 90 day

## 2018-05-20 ENCOUNTER — Telehealth: Payer: Self-pay | Admitting: Cardiovascular Disease

## 2018-05-20 MED ORDER — METOPROLOL TARTRATE 25 MG PO TABS
25.0000 mg | Freq: Two times a day (BID) | ORAL | 3 refills | Status: DC
Start: 2018-05-20 — End: 2019-03-02

## 2018-05-20 NOTE — Telephone Encounter (Signed)
New Message    *STAT* If patient is at the pharmacy, call can be transferred to refill team.   1. Which medications need to be refilled? (please list name of each medication and dose if known) Metoprolol 25mg   2. Which pharmacy/location (including street and city if local pharmacy) is medication to be sent to? [facility] in [place]   3. Do they need a 30 day or 90 day supply? 30 or 90

## 2018-05-20 NOTE — Telephone Encounter (Signed)
Spoke with Edwena Blow and informed her that I have sent rx refills to the pharmacy per her request. She voiced understanding.

## 2018-06-03 ENCOUNTER — Other Ambulatory Visit: Payer: Self-pay | Admitting: Cardiothoracic Surgery

## 2018-06-03 ENCOUNTER — Ambulatory Visit: Payer: Medicare HMO | Admitting: Cardiovascular Disease

## 2018-06-03 ENCOUNTER — Other Ambulatory Visit: Payer: Self-pay | Admitting: Cardiology

## 2018-06-03 ENCOUNTER — Encounter: Payer: Self-pay | Admitting: Cardiovascular Disease

## 2018-06-03 ENCOUNTER — Encounter

## 2018-06-03 DIAGNOSIS — I10 Essential (primary) hypertension: Secondary | ICD-10-CM

## 2018-06-03 DIAGNOSIS — Z951 Presence of aortocoronary bypass graft: Secondary | ICD-10-CM

## 2018-06-03 DIAGNOSIS — I4892 Unspecified atrial flutter: Secondary | ICD-10-CM

## 2018-06-03 DIAGNOSIS — E782 Mixed hyperlipidemia: Secondary | ICD-10-CM | POA: Diagnosis not present

## 2018-06-03 DIAGNOSIS — I25119 Atherosclerotic heart disease of native coronary artery with unspecified angina pectoris: Secondary | ICD-10-CM

## 2018-06-03 NOTE — Assessment & Plan Note (Signed)
History of CAD status post cardiac catheterization by myself 02/27/2018 revealing significant two-vessel disease and a left dominant system.  He underwent CABG x4 by Dr. Lovett Sox approximately a month later on 04/01/2018 with a LIMA to the LAD, vein graft to the ramus branch, obtuse marginal branch and posterior lateral branch of the LCx.  This was delayed because he had a dental issue that needed to be resolved in the interim.

## 2018-06-03 NOTE — Assessment & Plan Note (Signed)
History of peri-op PAF/PA flutter currently in a flutter with 3-1 conduction on Eliquis and amiodarone.  Ultimately he will need to have outpatient DC cardioversion.

## 2018-06-03 NOTE — Patient Instructions (Signed)
Medication Instructions:  NONE If you need a refill on your cardiac medications before your next appointment, please call your pharmacy.   Lab work: NONE If you have labs (blood work) drawn today and your tests are completely normal, you will receive your results only by: Marland Kitchen MyChart Message (if you have MyChart) OR . A paper copy in the mail If you have any lab test that is abnormal or we need to change your treatment, we will call you to review the results.  Testing/Procedures: NONE  Follow-Up: At [facility], you and your health needs are our priority.  As part of our continuing mission to provide you with exceptional heart care, we have created designated Provider Care Teams.  These Care Teams include your primary Cardiologist (physician) and Advanced Practice Providers (APPs -  Physician Assistants and Nurse Practitioners) who all work together to provide you with the care you need, when you need it. . You will need a follow up appointment in 6 weeks.  Please call our office 2 months in advance to schedule this appointment.  You may see Dr. Allyson Sabal or one of the following Advanced Practice Providers on your designated Care Team:   . Corine Shelter, [place] . Azalee Course, PA-C . Micah Flesher, PA-C . Joni Reining, DNP . Theodore Demark, PA-C . Judy Pimple, PA-C . Marjie Skiff, PA-C

## 2018-06-03 NOTE — Progress Notes (Signed)
06/03/2018 [name]   1943/02/01  [id number]  Primary Physician Guadlupe Spanish, MD Primary Cardiologist: Lorretta Harp MD [name], [place]  HPI:  [name] is a 73 y.o. moderately overweight widowed Caucasian male father of 2, grandfather of 6 granddaughters referred by Dr. Claudie Leach for cardiac catheterization because of new onset exertional chest pain and an abnormal Myoview stress test.  I last saw him in the office 02/25/2018. His risk factors include treated hypertension, diabetes and hyperlipidemia.  He is never had a heart attack or stroke.  There is no family history of heart disease.  He is retired from working Engineer, technical sales on Emergency planning/management officer.  He smoked remotely and stopped in 1982.  He is fairly active and golfs several times a week.  He gets chest pain after the first hole.  Recent 2D echo was essentially normal and a Myoview stress test did show inferior nontransmural scar with moderate lateral ischemic changes.   He underwent outpatient radial diagnostic cath by myself on 02/27/2018 revealing significant two-vessel disease and a left dominant system in the LAD and circumflex.  I referred him to Dr. Darcey Nora who ultimately performed CABG x4 on 04/01/2018 with a LIMA to the LAD, vein to a  ramus branch, obtuse marginal branch and PLA.  Was delayed 2 weeks because of dental issue that needed to be addressed.  His postop course was complicated.  He was in the ICU for 11 days then transferred to the floor and ultimately to rehab.  He was readmitted to the hospital with a liver abscess which underwent percutaneous drainage.  He is a PICC line in and is on systemic antibiotics.  He is weak and is slowly recovering.  He did have PAF and remains on amiodarone and Eliquis.  Current Meds  Medication Sig  . acetaminophen (TYLENOL) 500 MG tablet Take 1,000 mg by mouth daily as needed for moderate pain.  Marland Kitchen amiodarone (PACERONE) 200 MG tablet Take 1 tablet (200 mg total) by  mouth daily. MUST KEEP SCHEDULED APPT FOR FUTURE REFILLS  . amLODipine (NORVASC) 5 MG tablet Take 5 mg by mouth daily.  Marland Kitchen apixaban (ELIQUIS) 5 MG TABS tablet Take 1 tablet (5 mg total) by mouth 2 (two) times daily.  Marland Kitchen aspirin EC 81 MG tablet Take 1 tablet (81 mg total) by mouth daily.  . cefTRIAXone (ROCEPHIN) IVPB Inject 2 g into the vein daily for 21 days. Indication:  Liver Abscess Last Day of Therapy:  06/05/2018 Labs - Once weekly:  CBC/D and BMP, Labs - Every other week:  ESR and CRP  . Cholecalciferol (VITAMIN D3) 2000 units TABS Take 2,000 Units by mouth 2 (two) times daily.   Marland Kitchen docusate sodium (COLACE) 100 MG capsule Take 100 mg by mouth 2 (two) times daily.   . finasteride (PROSCAR) 5 MG tablet Take 5 mg by mouth daily.  Marland Kitchen levalbuterol (XOPENEX) 0.31 MG/3ML nebulizer solution Take 1 ampule by nebulization every 6 (six) hours as needed (COUGH OR DYSPNEA).  Marland Kitchen loratadine (CLARITIN) 10 MG tablet Take 10 mg by mouth daily.  . metFORMIN (GLUCOPHAGE) 1000 MG tablet Take 1,000 mg by mouth 2 (two) times daily.  . metoprolol tartrate (LOPRESSOR) 25 MG tablet Take 1 tablet (25 mg total) by mouth 2 (two) times daily.  . Multiple Vitamins-Minerals (MULTIVITAMIN ADULT PO) Take 1 tablet by mouth daily.  . sodium chloride flush (NS) 0.9 % SOLN Use 5-44m to flush chole drain daily  Allergies  Allergen Reactions  . Ciprofloxacin Rash  . Erythromycin Ethylsuccinate Rash    Social History   Socioeconomic History  . Marital status: Widowed    Spouse name: Not on file  . Number of children: 2  . Years of education: Not on file  . Highest education level: Not on file  Occupational History  . Occupation: retired  Scientific laboratory technician  . Financial resource strain: Not on file  . Food insecurity:    Worry: Not on file    Inability: Not on file  . Transportation needs:    Medical: Not on file    Non-medical: Not on file  Tobacco Use  . Smoking status: Former Smoker    Types: Cigarettes    Last  attempt to quit: 1982    Years since quitting: 38.1  . Smokeless tobacco: Never Used  . Tobacco comment: quit 1982  Substance and Sexual Activity  . Alcohol use: Not Currently  . Drug use: Never  . Sexual activity: Not Currently  Lifestyle  . Physical activity:    Days per week: Not on file    Minutes per session: Not on file  . Stress: Not on file  Relationships  . Social connections:    Talks on phone: Not on file    Gets together: Not on file    Attends religious service: Not on file    Active member of club or organization: Not on file    Attends meetings of clubs or organizations: Not on file    Relationship status: Not on file  . Intimate partner violence:    Fear of current or ex partner: Not on file    Emotionally abused: Not on file    Physically abused: Not on file    Forced sexual activity: Not on file  Other Topics Concern  . 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     Review of Systems: General: negative for chills, fever, night sweats or weight changes.  Cardiovascular: negative for chest pain, dyspnea on exertion, edema, orthopnea, palpitations, paroxysmal nocturnal dyspnea or shortness of breath Dermatological: negative for rash Respiratory: negative for cough or wheezing Urologic: negative for hematuria Abdominal: negative for nausea, vomiting, diarrhea, bright red blood per rectum, melena, or hematemesis Neurologic: negative for visual changes, syncope, or dizziness All other systems reviewed and are otherwise negative except as noted above.    Blood pressure 122/66, pulse 81, height '5\' 11"'$  (1.803 m), weight 235 lb 9.6 oz (106.9 kg).  General appearance: alert and no distress Neck: no adenopathy, no carotid bruit, no JVD, supple, symmetrical, trachea midline and thyroid not enlarged, symmetric, no tenderness/mass/nodules Lungs: clear to auscultation bilaterally Heart: irregularly irregular  rhythm Extremities: extremities normal, atraumatic, no cyanosis or edema Pulses: 2+ and symmetric Skin: Skin color, texture, turgor normal. No rashes or lesions Neurologic: Alert and oriented X 3, normal strength and tone. Normal symmetric reflexes. Normal coordination and gait  EKG atrial flutter with 3-1 conduction and ventricular spots of 83.  I personally reviewed this EKG.  There were inferior Q waves noted.  ASSESSMENT AND PLAN:   Essential hypertension History of essential hypertension blood pressure measured today 122/66.  He is on Norvasc and metoprolol.  Continue current meds at current dosing.  Hyperlipidemia History of hyperlipidemia not on statin therapy followed by his PCP.  He is currently not on statin therapy  Hx of CABG History of CAD status post  cardiac catheterization by myself 02/27/2018 revealing significant two-vessel disease and a left dominant system.  He underwent CABG x4 by Dr. Dahlia Byes approximately a month later on 04/01/2018 with a LIMA to the LAD, vein graft to the ramus branch, obtuse marginal branch and posterior lateral branch of the LCx.  This was delayed because he had a dental issue that needed to be resolved in the interim.  Atrial flutter ([place]) History of peri-op PAF/PA flutter currently in a flutter with 3-1 conduction on Eliquis and amiodarone.  Ultimately he will need to have outpatient DC cardioversion.      Lorretta Harp MD FACP,FACC,FAHA, [facility] 06/03/2018 11:05 AM

## 2018-06-03 NOTE — Assessment & Plan Note (Signed)
History of essential hypertension blood pressure measured today 122/66.  He is on Norvasc and metoprolol.  Continue current meds at current dosing.

## 2018-06-03 NOTE — Assessment & Plan Note (Signed)
History of hyperlipidemia not on statin therapy followed by his PCP.  He is currently not on statin therapy

## 2018-06-04 ENCOUNTER — Ambulatory Visit (INDEPENDENT_AMBULATORY_CARE_PROVIDER_SITE_OTHER): Payer: Self-pay | Admitting: Cardiothoracic Surgery

## 2018-06-04 ENCOUNTER — Ambulatory Visit
Admission: RE | Admit: 2018-06-04 | Discharge: 2018-06-04 | Disposition: A | Discharge status: Home / Self Care | Payer: Medicare HMO | Source: Ambulatory Visit | Attending: Cardiothoracic Surgery | Admitting: Cardiothoracic Surgery

## 2018-06-04 ENCOUNTER — Encounter: Payer: Self-pay | Admitting: Cardiothoracic Surgery

## 2018-06-04 ENCOUNTER — Encounter

## 2018-06-04 ENCOUNTER — Other Ambulatory Visit: Payer: Self-pay

## 2018-06-04 VITALS — BP 139/87 | HR 86 | Resp 16 | Ht 71.0 in | Wt 231.0 lb

## 2018-06-04 DIAGNOSIS — Z951 Presence of aortocoronary bypass graft: Secondary | ICD-10-CM

## 2018-06-04 DIAGNOSIS — K750 Abscess of liver: Secondary | ICD-10-CM

## 2018-06-04 DIAGNOSIS — I483 Typical atrial flutter: Secondary | ICD-10-CM

## 2018-06-04 NOTE — Progress Notes (Signed)
PCP is Guadlupe Spanish, MD Referring Provider is Lorretta Harp, MD  Chief Complaint  Patient presents with  . Routine Post Op    f/u with chest xray, s/p CABG x4    HPI: Patient returns for scheduled postop surgical follow-up.  History of urgent CABG x4 2 months ago for unstable angina.  Patient did well but developed postop atrial fibrillation and was discharged home on Eliquis amiodarone and metoprolol.  He has continued in atrial fibrillation, rate controlled and plans are made by his cardiologist Dr. Gwenlyn Found performed outpatient cardioversion.  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  He is followed by both ID and general surgery.  He states both the antibiotics/PICC line and biliary drain are to be removed fairly soon.  Because of the hepatic abscess became apparent several weeks after surgery he is not yet able to enter cardiac rehab.  We will refer him to the program at [facility], close to his home, after the biliary drain and PICC line have been removed.  Cardiac wise he has had no recurrent angina and his surgical incisions are healing well.  Postoperative chest x-ray is clear and he has no symptoms of CHF.  He has lost weight related to his thoracic surgery and biliary infection but that is now stabilized.   Past Medical History:  Diagnosis Date  . Allergies   . Anxiety   . CAD (coronary artery disease)   . Chest pain on exertion   . Diabetes ([place])   . Dyspnea    upon exertion  . Gout   . Hepatitis    at 73 years old  . Hyperlipemia   . Hypertension     Past Surgical History:  Procedure Laterality Date  . CORONARY ARTERY BYPASS GRAFT N/A 03/31/2018   Procedure: CORONARY ARTERY BYPASS GRAFTING (CABG) TIMES FOUR USING LEFT INTERNAL MAMMARY ARTERY AND RIGHT GREATER SAPHENOUS VEIN HARVESTED ENDOSCOPICALLY.;  Surgeon: Ivin Poot, MD;  Location: [facility];  Service: Open Heart Surgery;  Laterality: N/A;  . IR PERC CHOLECYSTOSTOMY  05/11/2018  . LEFT HEART CATH AND CORONARY ANGIOGRAPHY N/A 02/27/2018   Procedure: LEFT HEART CATH AND CORONARY ANGIOGRAPHY;  Surgeon: Lorretta Harp, MD;  Location: [facility];  Service: Cardiovascular;  Laterality: N/A;  . liver absess at 73 years old    . TEE WITHOUT CARDIOVERSION N/A 03/31/2018   Procedure: TRANSESOPHAGEAL ECHOCARDIOGRAM (TEE);  Surgeon: Prescott Gum, Collier Salina, MD;  Location: [facility];  Service: Open Heart Surgery;  Laterality: N/A;    Family History  Problem Relation Age of Onset  . Atrial fibrillation Mother   . Stroke Mother   . Cancer Maternal Aunt     Social History Social History   Tobacco Use  . Smoking status: Former Smoker    Types: Cigarettes    Last attempt to quit: 1982    Years since quitting: 38.1  . Smokeless tobacco: Never Used  . Tobacco comment: quit 1982  Substance Use Topics  . Alcohol use: Not Currently  . Drug use: Never    Current Outpatient Medications  Medication Sig Dispense Refill  . acetaminophen (TYLENOL) 500 MG tablet Take 1,000 mg by mouth daily as needed for moderate pain.    Marland Kitchen amiodarone (PACERONE) 200 MG tablet Take 1 tablet (200 mg total) by mouth daily. MUST KEEP SCHEDULED APPT FOR FUTURE  REFILLS 30 tablet 0  . amLODipine (NORVASC) 5 MG tablet Take 5 mg by mouth daily.    Marland Kitchen aspirin EC 81 MG tablet Take 1 tablet (81 mg total) by mouth daily. 90 tablet 3  . cefTRIAXone (ROCEPHIN) IVPB Inject 2 g into the vein daily for 21 days. Indication:  Liver Abscess Last Day of Therapy:  06/05/2018 Labs - Once weekly:  CBC/D and BMP, Labs - Every other week:  ESR and CRP 21 Units 0  . Cholecalciferol (VITAMIN D3) 2000 units TABS Take 2,000 Units by mouth 2 (two) times daily.     Marland Kitchen docusate sodium (COLACE) 100 MG capsule Take 100 mg by mouth 2 (two) times daily.     Marland Kitchen ELIQUIS 5 MG TABS tablet TAKE ONE TABLET BY MOUTH TWICE DAILY 60  tablet 6  . finasteride (PROSCAR) 5 MG tablet Take 5 mg by mouth daily.    Marland Kitchen levalbuterol (XOPENEX) 0.31 MG/3ML nebulizer solution Take 1 ampule by nebulization every 6 (six) hours as needed (COUGH OR DYSPNEA).    Marland Kitchen loratadine (CLARITIN) 10 MG tablet Take 10 mg by mouth daily.    . metFORMIN (GLUCOPHAGE) 1000 MG tablet Take 1,000 mg by mouth 2 (two) times daily.    . metoprolol tartrate (LOPRESSOR) 25 MG tablet Take 1 tablet (25 mg total) by mouth 2 (two) times daily. 180 tablet 3  . Multiple Vitamins-Minerals (MULTIVITAMIN ADULT PO) Take 1 tablet by mouth daily.    . sodium chloride flush (NS) 0.9 % SOLN Use 5-79m to flush chole drain daily 100 mL 1   No current facility-administered medications for this visit.     Allergies  Allergen Reactions  . Ciprofloxacin Rash  . Erythromycin Ethylsuccinate Rash    Review of Systems  Weight loss and some fatigue Complains of bad taste in his mouth related to his amiodarone and possibly the IV antibiotic No bleeding complications from the Eliquis  BP 139/87 (BP Location: Right Arm, Patient Position: Sitting, Cuff Size: Large)   Pulse 86 Comment: irregular  Resp 16   Ht '5\' 11"'$  (1.803 m)   Wt 231 lb (104.8 kg)   SpO2 97% Comment: RA  BMI 32.22 kg/m  Physical Exam      Exam    General- alert and comfortable    Neck- no JVD, no cervical adenopathy palpable, no carotid bruit   Lungs- clear without rales, wheezes.  Sternal incision well-healed.   Cor- regular rate and rhythm, no murmur , gallop   Abdomen- soft, non-tender.  Biliary drain from right upper quadrant   Extremities - warm, non-tender, minimal edema.  PICC line right upper arm   Neuro- oriented, appropriate, no focal weakness   Diagnostic Tests: Chest x-ray clear  Impression: Slow progress after urgent CABG for the above reasons including atrial fibrillation and hepatic abscess  Plan: [facility] He can drive and lift up to 15 pounds after his biliary drain is removed. Continue current medications Return for review of progress in 6 weeks.   PLen Childs MD [facility] ([phone]

## 2018-06-05 ENCOUNTER — Encounter: Payer: Self-pay | Admitting: Internal Medicine

## 2018-06-05 ENCOUNTER — Ambulatory Visit: Payer: Medicare HMO | Admitting: Internal Medicine

## 2018-06-05 ENCOUNTER — Encounter

## 2018-06-05 VITALS — BP 143/84 | HR 76 | Temp 97.7°F | Ht 71.0 in | Wt 238.4 lb

## 2018-06-05 DIAGNOSIS — N189 Chronic kidney disease, unspecified: Secondary | ICD-10-CM

## 2018-06-05 DIAGNOSIS — K810 Acute cholecystitis: Secondary | ICD-10-CM

## 2018-06-05 DIAGNOSIS — N289 Disorder of kidney and ureter, unspecified: Secondary | ICD-10-CM

## 2018-06-05 DIAGNOSIS — K750 Abscess of liver: Secondary | ICD-10-CM | POA: Diagnosis not present

## 2018-06-05 MED ORDER — AMOXICILLIN-POT CLAVULANATE 875-125 MG PO TABS
1.0000 | Freq: Two times a day (BID) | ORAL | 0 refills | Status: DC
Start: 2018-06-05 — End: 2018-07-15

## 2018-06-05 NOTE — Progress Notes (Signed)
   Subjective:    Patient ID: [name], male    DOB: [birth year], 73 y.o.   MRN: [id number]  HPI Here for follow up of liver abscess. Has been on ceftriaxone daily for E coli and Klebsiella positive growth and doing well.  Still has a drain in place and plan for CT on 2/25.  Culture also did grow Staph capitus after discharge.  Drain with biliary-looking fluid now, no pus.  No associated fever or chills.    Review of Systems  Constitutional: Negative for chills and fever.  Gastrointestinal: Negative for abdominal pain.       Objective:   Physical Exam Constitutional:      Appearance: Normal appearance.  Eyes:     General: No scleral icterus. Cardiovascular:     Rate and Rhythm: Normal rate and regular rhythm.     Heart sounds: No murmur.  Pulmonary:     Effort: Pulmonary effort is normal. No respiratory distress.     Breath sounds: Normal breath sounds.  Skin:    Findings: No rash.  Neurological:     Mental Status: He is alert.   SH: no tobacco        Assessment & Plan:

## 2018-06-05 NOTE — Addendum Note (Signed)
Addended by: Chana Bode on: 06/05/2018 10:35 AM   Modules accepted: Orders

## 2018-06-09 ENCOUNTER — Other Ambulatory Visit: Payer: Self-pay | Admitting: General Surgery

## 2018-06-09 DIAGNOSIS — K750 Abscess of liver: Secondary | ICD-10-CM

## 2018-06-09 DIAGNOSIS — Q441 Other congenital malformations of gallbladder: Secondary | ICD-10-CM

## 2018-06-09 DIAGNOSIS — K819 Cholecystitis, unspecified: Secondary | ICD-10-CM

## 2018-06-09 NOTE — Assessment & Plan Note (Signed)
Stable creat on labs.

## 2018-06-09 NOTE — Progress Notes (Signed)
Per Dr Luciana Axe 46cm  Single lumen Peripherally Inserted Central Catheter  removed from right basilic . No sutures present. Dressing was clean and dry . Patient experienced prolong bleeding which clotted with pressure being applied 3-5 minutes.  Area cleansed with chlorhexidine and petroleum dressing applied. Pt advised no heavy lifting with this arm, leave dressing for 24 hours and call the office if dressing becomes soaked with blood or sharp pain In arm presents.  Pt tolerated procedure well.    Laurell Josephs, RN

## 2018-06-09 NOTE — Assessment & Plan Note (Signed)
Doing well, seems to be drained.  To get scan soon at drain clinic and potential removal.   OK to stop the ceftriaxone and will convert to oral Augmentin.   rtc 3 weeks and may stop, depending on scan

## 2018-06-09 NOTE — Assessment & Plan Note (Signed)
Going to see surgery about potential surgical needs.

## 2018-06-11 ENCOUNTER — Ambulatory Visit
Admission: RE | Admit: 2018-06-11 | Discharge: 2018-06-11 | Disposition: A | Discharge status: Home / Self Care | Payer: Medicare HMO | Source: Ambulatory Visit | Attending: Physician Assistant | Admitting: Physician Assistant

## 2018-06-11 ENCOUNTER — Encounter

## 2018-06-11 ENCOUNTER — Encounter: Payer: Self-pay | Admitting: Radiology

## 2018-06-11 ENCOUNTER — Ambulatory Visit
Admission: RE | Admit: 2018-06-11 | Discharge: 2018-06-11 | Disposition: A | Discharge status: Home / Self Care | Payer: Medicare HMO | Source: Ambulatory Visit | Attending: General Surgery | Admitting: General Surgery

## 2018-06-11 DIAGNOSIS — K819 Cholecystitis, unspecified: Secondary | ICD-10-CM

## 2018-06-11 DIAGNOSIS — K750 Abscess of liver: Secondary | ICD-10-CM

## 2018-06-11 HISTORY — PX: IR RADIOLOGIST EVAL & MGMT: IMG5224

## 2018-06-11 MED ORDER — IOPAMIDOL (ISOVUE-300) INJECTION 61%
125.0000 mL | Freq: Once | INTRAVENOUS | Status: AC | PRN
Start: 2018-06-11 — End: 2018-06-11
  Administered 2018-06-11: 125 mL via INTRAVENOUS

## 2018-06-11 NOTE — Progress Notes (Signed)
Chief Complaint: Chole drain  Referring Physician(s): Lahoma CrockerSheehan, Theresa C   Supervising Physician: Ruel FavorsShick, Trevor  History of Present Illness: [name] is a 73 y.o. male with acute gangrenous cholecystitis and associated multilocular hepatic abscess.  He underwent successfully placement of a  transhepatic percutaneous drainage catheter for acute calculus and gangrenous cholecystitis with perforation and associated hepatic abscess by Dr. Archer AsaMcCullough on 05/11/2018.  He is here today for follow up.  He completed IV antibiotics a few days ago and is still taking Augmentin.  He has about 75 mL of hazy bilious drainage in his bag today.   Past Medical History:  Diagnosis Date  . Allergies   . Anxiety   . CAD (coronary artery disease)   . Chest pain on exertion   . Diabetes (HCC)   . Dyspnea    upon exertion  . Gout   . Hepatitis    at 73 years old  . Hyperlipemia   . Hypertension     Past Surgical History:  Procedure Laterality Date  . CORONARY ARTERY BYPASS GRAFT N/A 03/31/2018   Procedure: CORONARY ARTERY BYPASS GRAFTING (CABG) TIMES FOUR USING LEFT INTERNAL MAMMARY ARTERY AND RIGHT GREATER SAPHENOUS VEIN HARVESTED ENDOSCOPICALLY.;  Surgeon: Kerin PernaVan Trigt, Peter, MD;  [facility];  Service: Open Heart Surgery;  Laterality: N/A;  . IR PERC CHOLECYSTOSTOMY  05/11/2018  . LEFT HEART CATH AND CORONARY ANGIOGRAPHY N/A 02/27/2018   Procedure: LEFT HEART CATH AND CORONARY ANGIOGRAPHY;  Surgeon: Runell GessBerry, Jonathan J, MD;  Location: [facility] INVASIVE CV LAB;  Service: Cardiovascular;  Laterality: N/A;  . liver absess at 73 years old    . TEE WITHOUT CARDIOVERSION N/A 03/31/2018   Procedure: TRANSESOPHAGEAL ECHOCARDIOGRAM (TEE);  Surgeon: Donata ClayVan Trigt, Theron AristaPeter, MD;  [facility];  Service: Open Heart Surgery;  Laterality: N/A;    Allergies: Ciprofloxacin and Erythromycin ethylsuccinate  Medications: Prior to Admission medications   Medication Sig Start Date End Date Taking?  Authorizing Provider  acetaminophen (TYLENOL) 500 MG tablet Take 1,000 mg by mouth daily as needed for moderate pain.    [provider]  amiodarone (PACERONE) 200 MG tablet Take 1 tablet (200 mg total) by mouth daily. MUST KEEP SCHEDULED APPT FOR FUTURE REFILLS 05/19/18   Runell GessBerry, Jonathan J, MD  amLODipine (NORVASC) 5 MG tablet Take 5 mg by mouth daily. 02/05/18   [provider]  amoxicillin-clavulanate (AUGMENTIN) 875-125 MG tablet Take 1 tablet by mouth 2 (two) times daily. 06/05/18   Gardiner Barefootomer, Robert W, MD  aspirin EC 81 MG tablet Take 1 tablet (81 mg total) by mouth daily. 05/01/18   Abelino DerrickKilroy, Luke K, PA-C  Cholecalciferol (VITAMIN D3) 2000 units TABS Take 2,000 Units by mouth 2 (two) times daily.     [provider]  docusate sodium (COLACE) 100 MG capsule Take 100 mg by mouth 2 (two) times daily.     [provider]  ELIQUIS 5 MG TABS tablet TAKE ONE TABLET BY MOUTH TWICE DAILY Patient not taking: Reported on 06/05/2018 06/03/18   Runell GessBerry, Jonathan J, MD  finasteride (PROSCAR) 5 MG tablet Take 5 mg by mouth daily. 02/05/18   [provider]  levalbuterol (XOPENEX) 0.31 MG/3ML nebulizer solution Take 1 ampule by nebulization every 6 (six) hours as needed (COUGH OR DYSPNEA).    [provider]  loratadine (CLARITIN) 10 MG tablet Take 10 mg by mouth daily.    [provider]  metFORMIN (GLUCOPHAGE) 1000 MG tablet Take 1,000 mg by mouth  2 (two) times daily. 02/05/18   [provider]  metoprolol tartrate (LOPRESSOR) 25 MG tablet Take 1 tablet (25 mg total) by mouth 2 (two) times daily. 05/20/18   Runell Gess, MD  Multiple Vitamins-Minerals (MULTIVITAMIN ADULT PO) Take 1 tablet by mouth daily.    [provider]  sodium chloride flush (NS) 0.9 % SOLN Use 5-15mL to flush chole drain daily 05/15/18   Noralee Stain, DO     Family History  Problem Relation Age of Onset  . Atrial fibrillation Mother   . Stroke Mother   . Cancer  Maternal Aunt     Social History   Socioeconomic History  . Marital status: Widowed    Spouse name: Not on file  . Number of children: 2  . Years of education: Not on file  . Highest education level: Not on file  Occupational History  . Occupation: retired  Engineer, production  . Financial resource strain: Not on file  . Food insecurity:    Worry: Not on file    Inability: Not on file  . Transportation needs:    Medical: Not on file    Non-medical: Not on file  Tobacco Use  . Smoking status: Former Smoker    Types: Cigarettes    Last attempt to quit: 1982    Years since quitting: 38.1  . Smokeless tobacco: Never Used  . Tobacco comment: quit 1982  Substance and Sexual Activity  . Alcohol use: Not Currently  . Drug use: Never  . Sexual activity: Not Currently  Lifestyle  . Physical activity:    Days per week: Not on file    Minutes per session: Not on file  . Stress: Not on file  Relationships  . Social connections:    Talks on phone: Not on file    Gets together: Not on file    Attends religious service: Not on file    Active member of club or organization: Not on file    Attends meetings of clubs or organizations: Not on file    Relationship status: Not on file  Other Topics Concern  . 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   Review of Systems  Physical Exam Constitutional:      Appearance: Normal appearance.  Eyes:     Extraocular Movements: Extraocular movements intact.  Pulmonary:     Effort: Pulmonary effort is normal. No respiratory distress.  Abdominal:     Comments: RUQ drain in place  Neurological:     Mental Status: He is alert.   Drain injection shows a stone at the cystic duct but contrast does pass into the cystic duct down the CBD and into the duodenum.     Imaging: Dg Chest 2 View  Result Date: 06/04/2018 CLINICAL DATA:  CABG March 31, 2018.  Tenderness. EXAM: CHEST - 2 VIEW  COMPARISON:  May 14, 2018 FINDINGS: A right PICC line terminates in the SVC, stable. No pneumothorax. Scarring in the left lateral mid lung. Probable atelectasis in the left base. The cardiomediastinal silhouette is unchanged. No pulmonary nodules or masses. No other acute abnormalities. IMPRESSION: 1. Haziness in the left base is likely scarring or atelectasis. Stable right PICC line. No interval change. Electronically Signed   By: Gerome Sam III M.D   On: 06/04/2018 11:53   Dg Chest Port 1 View  Result Date: 05/14/2018 CLINICAL DATA:  PICC line placement EXAM: PORTABLE CHEST  1 VIEW COMPARISON:  05/11/2018 FINDINGS: There is a right-sided PICC line with the tip projecting over the SVC. There is no focal parenchymal opacity. There is no pleural effusion or pneumothorax. There is mild bilateral interstitial prominence. There is stable cardiomegaly. There is evidence of prior CABG. The osseous structures are unremarkable. IMPRESSION: Right-sided PICC line with the tip projecting over the SVC. Electronically Signed   By: Elige KoHetal  Patel   On: 05/14/2018 11:58   [place] Ekg Site Rite  Result Date: 05/13/2018 If Site Rite image not attached, placement could not be confirmed due to current cardiac rhythm.   Labs:  CBC: Recent Labs    05/11/18 0257 05/12/18 0321 05/13/18 0243 05/15/18 0415  WBC 9.9 9.5 8.4 6.0  HGB 8.3* 8.3* 8.1* 7.9*  HCT 27.5* 27.2* 26.8* 26.4*  PLT 306 354 333 394    COAGS: Recent Labs    03/14/18 1610 03/26/18 1146 03/31/18 1458 04/08/18 0238 05/10/18 0907  INR 0.96 0.96 1.37 1.06 1.42  APTT 32 31 29  --   --     BMP: Recent Labs    05/12/18 0321 05/13/18 0243 05/14/18 0238 05/15/18 0415  NA 130* 130* 132* 135  K 4.1 4.1 3.7 3.7  CL 98 98 98 102  CO2 22 21* 22 24  GLUCOSE 151* 157* 128* 128*  BUN 11 13 15 12   CALCIUM 8.7* 8.9 8.8* 8.9  CREATININE 1.14 1.37* 1.43* 1.21  GFRNONAA >60 50* 47* 58*  GFRAA >60 58* 55* >60    LIVER FUNCTION  TESTS: Recent Labs    05/10/18 0534 05/13/18 0243 05/14/18 0238 05/15/18 0415  BILITOT 0.5 0.5 0.5 0.5  AST 43* 37 30 25  ALT 64* 46* 36 30  ALKPHOS 186* 181* 155* 130*  PROT 6.3* 5.9* 5.7* 5.3*  ALBUMIN 2.3* 2.1* 2.1* 2.1*    TUMOR MARKERS: No results for input(s): AFPTM, CEA, CA199, CHROMGRNA in the last 8760 hours.  Assessment/Plan:  S/P transhepatic percutaneous drainage catheter for acute calculus and gangrenous cholecystitis with perforation and associated hepatic abscess by Dr. Archer AsaMcCullough on 05/11/2018.  CT scan reviewed today by Dr. Miles CostainShick. Hepatic abscess is resolved.  Keep drain to gravity bag and continue flushing as normal.  He is to see Dr. Donell BeersByerly to discuss plans for cholecystectomy.  If he has a Lap chole, he does not need to follow up with us.   If Dr. Donell BeersByerly feels he is too high risk for surgery, we will need to exchange the drain in about 2 weeks.   Electronically Signed: Gwynneth MacleodWENDY S Amea Mcphail PA-C 06/11/2018, 12:29 PM    Please refer to Dr. Chester HolsteinShick's attestation of this note for management and plan.

## 2018-06-16 ENCOUNTER — Telehealth (HOSPITAL_COMMUNITY): Payer: Self-pay

## 2018-06-16 NOTE — Telephone Encounter (Signed)
Called to schedule drain exchange. Pt is going to have his gallbladder taken out so he will call me once surgery is scheduled. AW

## 2018-06-16 NOTE — Telephone Encounter (Signed)
Called to schedule drain exchange, no answer, left vm. AW 

## 2018-06-19 ENCOUNTER — Other Ambulatory Visit (HOSPITAL_COMMUNITY): Payer: Self-pay | Admitting: Interventional Radiology

## 2018-06-19 DIAGNOSIS — K819 Cholecystitis, unspecified: Secondary | ICD-10-CM

## 2018-06-20 ENCOUNTER — Telehealth: Payer: Self-pay | Admitting: General Surgery

## 2018-06-20 ENCOUNTER — Other Ambulatory Visit: Payer: Self-pay | Admitting: General Surgery

## 2018-06-20 NOTE — Telephone Encounter (Signed)
Cholecystostomy tube patent as well as cystic duct and common bile duct.    Discussed with IR. Tube to be capped off.  If recurrent symptoms or jaundice, will need to be open to bag drainage again.    Will then plan surgery after MRI.

## 2018-06-24 ENCOUNTER — Encounter

## 2018-06-24 ENCOUNTER — Ambulatory Visit: Payer: Medicare HMO | Admitting: Internal Medicine

## 2018-06-24 ENCOUNTER — Other Ambulatory Visit: Payer: Medicare HMO

## 2018-06-24 ENCOUNTER — Encounter: Payer: Self-pay | Admitting: Internal Medicine

## 2018-06-24 DIAGNOSIS — K810 Acute cholecystitis: Secondary | ICD-10-CM

## 2018-06-24 DIAGNOSIS — K750 Abscess of liver: Secondary | ICD-10-CM | POA: Diagnosis not present

## 2018-06-24 NOTE — Assessment & Plan Note (Signed)
Resolved now so will stop the Augmentin and observe off of antiotics

## 2018-06-24 NOTE — Assessment & Plan Note (Signed)
MRI scheduled for March and follow up with Dr. Donell Beers

## 2018-06-24 NOTE — Progress Notes (Signed)
   Subjective:    Patient ID: [name], male    DOB: [birth year], 73 y.o.   MRN: [id number]  HPI Here for follow up of liver abscess. Has was on ceftrixone and metronidazole for a prolonged period and I changed him to Augmentin.  Repeat CT done on 2/12 and abscess was resolved.   Drain with biliary-looking fluid now, no pus.  No associated fever or chills.    Review of Systems  Constitutional: Negative for chills and fever.  Gastrointestinal: Negative for abdominal pain.       Objective:   Physical Exam Constitutional:      Appearance: Normal appearance.  Eyes:     General: No scleral icterus. Cardiovascular:     Rate and Rhythm: Normal rate and regular rhythm.     Heart sounds: No murmur.  Pulmonary:     Effort: Pulmonary effort is normal. No respiratory distress.     Breath sounds: Normal breath sounds.  Skin:    Findings: No rash.  Neurological:     Mental Status: He is alert.   SH: no tobacco        Assessment & Plan:

## 2018-06-27 ENCOUNTER — Other Ambulatory Visit (HOSPITAL_COMMUNITY): Payer: Self-pay | Admitting: Interventional Radiology

## 2018-06-27 ENCOUNTER — Encounter

## 2018-06-27 ENCOUNTER — Encounter (HOSPITAL_COMMUNITY): Payer: Self-pay | Admitting: Interventional Radiology

## 2018-06-27 ENCOUNTER — Ambulatory Visit (HOSPITAL_COMMUNITY)
Admission: RE | Admit: 2018-06-27 | Discharge: 2018-06-27 | Disposition: A | Discharge status: Home / Self Care | Payer: Medicare HMO | Source: Ambulatory Visit | Attending: Interventional Radiology | Admitting: Interventional Radiology

## 2018-06-27 DIAGNOSIS — K819 Cholecystitis, unspecified: Secondary | ICD-10-CM

## 2018-06-27 DIAGNOSIS — Z4803 Encounter for change or removal of drains: Secondary | ICD-10-CM | POA: Diagnosis present

## 2018-06-27 HISTORY — PX: IR EXCHANGE BILIARY DRAIN: IMG6046

## 2018-06-27 MED ORDER — LIDOCAINE HCL (PF) 1 % IJ SOLN
INTRAMUSCULAR | Status: DC | PRN
Start: 2018-06-27 — End: 2018-06-28
  Administered 2018-06-27: 5 mL

## 2018-06-27 MED ORDER — LIDOCAINE HCL 1 % IJ SOLN
INTRAMUSCULAR | Status: AC
Start: 2018-06-27 — End: 2018-06-27
  Filled 2018-06-27: qty 20

## 2018-06-27 MED ORDER — IOPAMIDOL (ISOVUE-300) INJECTION 61%
INTRAVENOUS | Status: AC
Start: 2018-06-27 — End: 2018-06-27
  Administered 2018-06-27: 15 mL
  Filled 2018-06-27: qty 50

## 2018-06-27 NOTE — Procedures (Signed)
  Procedure: Cholangiogram and exchange GB drain left capped for internal drainage EBL:   minimal Complications:  none immediate  See full dictation in [employer].  Thora Lance MD Main # [phone] Pager  [phone]

## 2018-07-01 ENCOUNTER — Ambulatory Visit
Admission: RE | Admit: 2018-07-01 | Discharge: 2018-07-01 | Disposition: A | Discharge status: Home / Self Care | Payer: Medicare HMO | Source: Ambulatory Visit | Attending: General Surgery | Admitting: General Surgery

## 2018-07-01 ENCOUNTER — Encounter

## 2018-07-01 DIAGNOSIS — Q441 Other congenital malformations of gallbladder: Secondary | ICD-10-CM

## 2018-07-01 MED ORDER — GADOBENATE DIMEGLUMINE 529 MG/ML IV SOLN
20.0000 mL | Freq: Once | INTRAVENOUS | Status: AC | PRN
Start: 2018-07-01 — End: 2018-07-01
  Administered 2018-07-01: 20 mL via INTRAVENOUS

## 2018-07-02 ENCOUNTER — Other Ambulatory Visit: Payer: Self-pay | Admitting: General Surgery

## 2018-07-03 ENCOUNTER — Other Ambulatory Visit: Payer: Self-pay | Admitting: Cardiovascular Disease

## 2018-07-07 ENCOUNTER — Other Ambulatory Visit: Payer: Medicare HMO

## 2018-07-07 ENCOUNTER — Encounter

## 2018-07-08 ENCOUNTER — Telehealth: Payer: Self-pay

## 2018-07-08 NOTE — Telephone Encounter (Signed)
Yes, I would be happy to see in the office and make decisions regarding preoperative clearance and his anticoagulation status

## 2018-07-08 NOTE — Telephone Encounter (Signed)
It's ok with me. 

## 2018-07-08 NOTE — Telephone Encounter (Signed)
Dr.  Dwan Bolt has appt with Dr. Allyson Sabal 07/15/18 is it ok to wait until then for clearance.?   Dr. Allyson Sabal aware pt will need clearance.  Thank you  Nada Boozer, NP-C

## 2018-07-08 NOTE — Telephone Encounter (Signed)
Pharm please address Eliquis 

## 2018-07-08 NOTE — Telephone Encounter (Signed)
   [facility] Pre-operative Risk Assessment        [facility] Pre-operative Risk Assessment    Request for surgical clearance:  1. What type of surgery is being performed? Cholecystectomy  2. When is this surgery scheduled? TBD  3. What type of clearance is required (medical clearance vs. Pharmacy clearance to hold med vs. Both)? Both  4. Are there any medications that need to be held prior to surgery and how long? Eliquis  5. Practice name and name of physician performing surgery?  [facility]  Dr.Faera Byerly  6. What is your office phone number [phone]   7.   What is your office fax number [phone]  8.   Anesthesia type  General   Neoma Laming 07/08/2018, 8:34 AM  _________________________________________________________________   (provider comments below)

## 2018-07-08 NOTE — Telephone Encounter (Signed)
Dr. Allyson Sabal Pt needs cholecystectomy - he is on Eliquis and in a flutter.  He has appt with you next Tuesday the 17th,  I think he needs to keep and let you clear him for surg with instructions on his a flutter.  Post CABG he is healing.  He still has liver drain.  Let me know if you agree.  And I will let Dr. Donell Beers know. Pt did have acute gangrenous cholecystitis and treated with ABX.  Last saw ID 06/24/18.

## 2018-07-08 NOTE — Telephone Encounter (Signed)
Pt aware surgical clearance will be done 07/15/18 on office visit with Dr.Berry, Dr. Donell Beers aware as well.

## 2018-07-08 NOTE — Telephone Encounter (Addendum)
Pt takes Eliquis for afib with CHADS2VASc score of 5 (age x2, HTN, DM, CAD). Dr Hazle Coca most recent office visit from 06/03/18 mentions patient needing DCCV, however there was no date set. SCr 1.21, CrCl 71mL/min. Ok to hold Eliquis for 2 days prior to procedure. After, he will require 3 weeks of uninterrupted anticoagulation prior to DCCV.

## 2018-07-09 ENCOUNTER — Other Ambulatory Visit: Payer: Medicare HMO

## 2018-07-09 ENCOUNTER — Encounter

## 2018-07-15 ENCOUNTER — Other Ambulatory Visit: Payer: Self-pay

## 2018-07-15 ENCOUNTER — Ambulatory Visit: Payer: Medicare HMO | Admitting: Cardiovascular Disease

## 2018-07-15 ENCOUNTER — Encounter

## 2018-07-15 ENCOUNTER — Encounter: Payer: Self-pay | Admitting: Cardiovascular Disease

## 2018-07-15 DIAGNOSIS — Z951 Presence of aortocoronary bypass graft: Secondary | ICD-10-CM

## 2018-07-15 DIAGNOSIS — I10 Essential (primary) hypertension: Secondary | ICD-10-CM

## 2018-07-15 DIAGNOSIS — I483 Typical atrial flutter: Secondary | ICD-10-CM | POA: Diagnosis not present

## 2018-07-15 DIAGNOSIS — E782 Mixed hyperlipidemia: Secondary | ICD-10-CM | POA: Diagnosis not present

## 2018-07-15 NOTE — Assessment & Plan Note (Signed)
History of essential hypertension with blood pressure measured today at 152/80.  He is on Lopressor and amlodipine.

## 2018-07-15 NOTE — Progress Notes (Signed)
07/15/2018 [name]   1942/06/08  [id number]  Primary Physician Karle Plumber, MD Primary Cardiologist: Runell Gess MD Nicholes Calamity, [place]  HPI:  [name] is a 73 y.o.  moderately overweight widowed Caucasian male father of 2, grandfather of 6 granddaughters referred by Dr. Hanley Hays for cardiac catheterization because of new onset exertional chest pain and an abnormal Myoview stress test.  I last saw him in the office 06/03/2021.His risk factors include treated hypertension, diabetes and hyperlipidemia. He is never had a heart attack or stroke. There is no family history of heart disease. 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 He smoked remotely and stopped in 1982. He is fairly active and golfs several times a week. He gets chest pain after the first hole. Recent 2D echo was essentially normal and a Myoview stress test did show inferior nontransmural scar with moderate lateral ischemic changes.   He underwent outpatient radial diagnostic cath by myself on 02/27/2018 revealing significant two-vessel disease and a left dominant system in the LAD and circumflex.  I referred him to Dr. Maren Beach who ultimately performed CABG x4 on 04/01/2018 with a LIMA to the LAD, vein to a  ramus branch, obtuse marginal branch and PLA.  Was delayed 2 weeks because of dental issue that needed to be addressed.  His postop course was complicated.  He was in the ICU for 11 days then transferred to the floor and ultimately to rehab.  He was readmitted to the hospital with a liver abscess which underwent percutaneous drainage.    Since I saw him 6 weeks ago he has done well.  He remains on Eliquis and amiodarone in a flutter with controlled ventricular rate.  He still has an indwelling catheter in his liver for drainage although this apparently has already drained.  He also apparently needs elective cholecystectomy by Dr. Donell Beers.  Current Meds  Medication Sig  .  acetaminophen (TYLENOL) 500 MG tablet Take 1,000 mg by mouth daily as needed for moderate pain.  Marland Kitchen amiodarone (PACERONE) 200 MG tablet TAKE 1 TABLET BY MOUTH DAILY  . amLODipine (NORVASC) 5 MG tablet Take 5 mg by mouth daily.  Marland Kitchen aspirin EC 81 MG tablet Take 1 tablet (81 mg total) by mouth daily.  . Cholecalciferol (VITAMIN D3) 2000 units TABS Take 2,000 Units by mouth 2 (two) times daily.   Marland Kitchen docusate sodium (COLACE) 100 MG capsule Take 100 mg by mouth 2 (two) times daily.   Marland Kitchen ELIQUIS 5 MG TABS tablet TAKE ONE TABLET BY MOUTH TWICE DAILY  . finasteride (PROSCAR) 5 MG tablet Take 5 mg by mouth daily.  Marland Kitchen loratadine (CLARITIN) 10 MG tablet Take 10 mg by mouth daily.  . metFORMIN (GLUCOPHAGE) 1000 MG tablet Take 1,000 mg by mouth 2 (two) times daily.  . metoprolol tartrate (LOPRESSOR) 25 MG tablet Take 1 tablet (25 mg total) by mouth 2 (two) times daily.  . Multiple Vitamins-Minerals (MULTIVITAMIN ADULT PO) Take 1 tablet by mouth daily.  . sodium chloride flush (NS) 0.9 % SOLN Use 5-42mL to flush chole drain daily  . [DISCONTINUED] amoxicillin-clavulanate (AUGMENTIN) 875-125 MG tablet Take 1 tablet by mouth 2 (two) times daily.  . [DISCONTINUED] levalbuterol (XOPENEX) 0.31 MG/3ML nebulizer solution Take 1 ampule by nebulization every 6 (six) hours as needed (COUGH OR DYSPNEA).  . [DISCONTINUED] losartan (COZAAR) 50 MG tablet Take 1 tablet by mouth daily.     Allergies  Allergen Reactions  .  Ciprofloxacin Rash  . Erythromycin Ethylsuccinate Rash    Social History   Socioeconomic History  . Marital status: Widowed    Spouse name: Not on file  . Number of children: 2  . Years of education: Not on file  . Highest education level: Not on file  Occupational History  . Occupation: retired  Engineer, production  . Financial resource strain: Not on file  . Food insecurity:    Worry: Not on file    Inability: Not on file  . Transportation needs:    Medical: Not on file    Non-medical: Not on file   Tobacco Use  . Smoking status: Former Smoker    Types: Cigarettes    Last attempt to quit: 1982    Years since quitting: 38.2  . Smokeless tobacco: Never Used  . Tobacco comment: quit 1982  Substance and Sexual Activity  . Alcohol use: Not Currently  . Drug use: Never  . Sexual activity: Not Currently  Lifestyle  . Physical activity:    Days per week: Not on file    Minutes per session: Not on file  . Stress: Not on file  Relationships  . Social connections:    Talks on phone: Not on file    Gets together: Not on file    Attends religious service: Not on file    Active member of club or organization: Not on file    Attends meetings of clubs or organizations: Not on file    Relationship status: Not on file  . Intimate partner violence:    Fear of current or ex partner: Not on file    Emotionally abused: Not on file    Physically abused: Not on file    Forced sexual activity: Not on file  Other Topics Concern  . 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     Review of Systems: General: negative for chills, fever, night sweats or weight changes.  Cardiovascular: negative for chest pain, dyspnea on exertion, edema, orthopnea, palpitations, paroxysmal nocturnal dyspnea or shortness of breath Dermatological: negative for rash Respiratory: negative for cough or wheezing Urologic: negative for hematuria Abdominal: negative for nausea, vomiting, diarrhea, bright red blood per rectum, melena, or hematemesis Neurologic: negative for visual changes, syncope, or dizziness All other systems reviewed and are otherwise negative except as noted above.    Blood pressure (!) 152/80, pulse 68, height 5\' 11"  (1.803 m), weight 248 lb 6.4 oz (112.7 kg).  General appearance: alert and no distress Neck: no adenopathy, no carotid bruit, no JVD, supple, symmetrical, trachea midline and thyroid not enlarged, symmetric, no tenderness/mass/nodules  Lungs: clear to auscultation bilaterally Heart: irregularly irregular rhythm Extremities: extremities normal, atraumatic, no cyanosis or edema Pulses: 2+ and symmetric Skin: Skin color, texture, turgor normal. No rashes or lesions Neurologic: Alert and oriented X 3, normal strength and tone. Normal symmetric reflexes. Normal coordination and gait  EKG atrial flutter with ventricular rate of 63.  I personally reviewed this EKG  ASSESSMENT AND PLAN:   Essential hypertension History of essential hypertension with blood pressure measured today at 152/80.  He is on Lopressor and amlodipine.  Hyperlipidemia History of hyperlipidemia not on statin therapy followed by his PCP  Hx of CABG History of CAD status post cardiac catheterization performed by myself 02/27/2018 revealing significant two-vessel disease in the left dominant system.  Ultimately underwent CABG x4 by Dr. Lovett Sox 04/01/2018 with a LIMA  to his LAD, vein to the ramus branch, obtuse marginal branch and PLA.  His procedure was complicated by a subsequent liver abscess requiring drainage.  He continues to have a tube in place but apparently the liver issue has resolved.  He denies chest pain or shortness of breath.  Atrial flutter (HCC) History of atrial flutter post bypass surgery on Eliquis and amiodarone.  He remains in a flutter today.  I suspect he will be easily cardioverted although we need to coordinate this with Dr. Donell Beers who is planning on doing elective cholecystectomy in the near future off of Eliquis.      Runell Gess MD FACP,FACC,FAHA, [facility] 07/15/2018 12:24 PM

## 2018-07-15 NOTE — Assessment & Plan Note (Signed)
History of hyperlipidemia not on statin therapy followed by his PCP 

## 2018-07-15 NOTE — Assessment & Plan Note (Signed)
History of CAD status post cardiac catheterization performed by myself 02/27/2018 revealing significant two-vessel disease in the left dominant system.  Ultimately underwent CABG x4 by Dr. Lovett Sox 04/01/2018 with a LIMA to his LAD, vein to the ramus branch, obtuse marginal branch and PLA.  His procedure was complicated by a subsequent liver abscess requiring drainage.  He continues to have a tube in place but apparently the liver issue has resolved.  He denies chest pain or shortness of breath.

## 2018-07-15 NOTE — Patient Instructions (Signed)
Medication Instructions:  Your physician recommends that you continue on your current medications as directed. Please refer to the Current Medication list given to you today.  If you need a refill on your cardiac medications before your next appointment, please call your pharmacy.   Lab work: NONE If you have labs (blood work) drawn today and your tests are completely normal, you will receive your results only by: . MyChart Message (if you have MyChart) OR . A paper copy in the mail If you have any lab test that is abnormal or we need to change your treatment, we will call you to review the results.  Testing/Procedures: NONE  Follow-Up: At [facility], you and your health needs are our priority.  As part of our continuing mission to provide you with exceptional heart care, we have created designated Provider Care Teams.  These Care Teams include your primary Cardiologist (physician) and Advanced Practice Providers (APPs -  Physician Assistants and Nurse Practitioners) who all work together to provide you with the care you need, when you need it. . You will need a follow up appointment in 3 months.  Please call our office 2 months in advance to schedule this appointment.  You may see Dr. Berry or one of the following Advanced Practice Providers on your designated Care Team:   . Luke Kilroy, PA-C . Hao Meng, PA-C . Angela Duke, PA-C . Kathryn Lawrence, DNP . Rhonda Barrett, PA-C . Krista Kroeger, PA-C . Callie Goodrich, PA-C   

## 2018-07-15 NOTE — Assessment & Plan Note (Signed)
History of atrial flutter post bypass surgery on Eliquis and amiodarone.  He remains in a flutter today.  I suspect he will be easily cardioverted although we need to coordinate this with Dr. Donell Beers who is planning on doing elective cholecystectomy in the near future off of Eliquis.

## 2018-07-16 ENCOUNTER — Encounter

## 2018-07-16 ENCOUNTER — Encounter: Payer: Medicare HMO | Admitting: Cardiothoracic Surgery

## 2018-07-17 ENCOUNTER — Telehealth: Payer: Self-pay

## 2018-07-22 ENCOUNTER — Telehealth: Payer: Self-pay

## 2018-07-22 NOTE — Telephone Encounter (Signed)
-----   Message from Kerin Perna, MD sent at 07/22/2018  1:56 PM EDT ----- Thanks PVT ----- Message ----- From: Steve Rattler, RN Sent: 07/22/2018  10:59 AM EDT To: Kerin Perna, MD  Hey,  I contacted the patient per your request.  He stated that he would call our office back if he wished to follow-up with you in the future.    He stated that Dr. Allyson Sabal wanted him to have a Cardioversion, but with everything going on, they cancelled it along with Cardiac Rehab.  He also stated that he has a biliary drainage tube in place due to his gallbladder that needs to be removed.  But, that he was doing well from a Cardiac surgical standpoint.  I have cancelled his appointment for Thursday.  Thanks,  Morrie Sheldon

## 2018-07-24 ENCOUNTER — Encounter: Payer: Medicare HMO | Admitting: Cardiothoracic Surgery

## 2018-07-24 ENCOUNTER — Encounter

## 2018-07-24 NOTE — Telephone Encounter (Signed)
Error~ Encounter closed.

## 2018-07-25 ENCOUNTER — Encounter

## 2018-08-21 ENCOUNTER — Encounter

## 2018-09-16 ENCOUNTER — Ambulatory Visit: Admit: 2018-09-16 | Payer: Medicare HMO | Admitting: General Surgery

## 2018-09-16 SURGERY — LAPAROSCOPIC CHOLECYSTECTOMY
Anesthesia: General

## 2018-10-03 NOTE — Progress Notes (Addendum)
[facility] - [place], [place] - [address] [place] [id number] Phone: [phone] Fax: [phone]      Your procedure is scheduled on June 9th.  [facility]" at 5:30 A.M., and check in at the Admitting office.  Call this number if you have problems the morning of surgery:  [phone]  Call [phone] if you have any questions prior to your surgery date Monday-Friday 8am-4pm    Remember:  Do not eat after midnight.  You may drink clear liquids until 4:30 AM.   Clear liquids allowed are: water, non-citric juices (without pulp), carbonated beverages, clear tea, black coffee, and gatorade  Please complete your PRE-SURGERY G2 Gatorade that was provided to you 3 hours prior to you surgery start time.  Please, if able, drink it in one setting. DO NOT SIP.    Take these medicines the morning of surgery with A SIP OF WATER   Tylenol - if needed  Amlodipine (Norvasc)  Metoprolol   Follow your surgeon's instructions on when to stop Aspirin & Eliquis.  If no instructions were given by your surgeon then you will need to call the office to get those instructions.    7 days prior to surgery STOP taking any Aspirin (unless otherwise instructed by your surgeon), Aleve, Naproxen, Ibuprofen, Motrin, Advil, Goody's, BC's, all herbal medications, fish oil, and all vitamins.   WHAT DO I DO ABOUT MY DIABETES MEDICATION?   Marland Kitchen. Do not take oral diabetes medicines (pills) the morning of surgery. - Metformin    How to Manage Your Diabetes Before and After Surgery  Why is it important to control my blood sugar before and after surgery? . Improving blood sugar levels before and after surgery helps healing and can limit problems. . A way of improving blood sugar control is eating a healthy diet by: o  Eating less sugar and carbohydrates o  Increasing activity/exercise o  Talking with your doctor about reaching your blood sugar  goals . High blood sugars (greater than 180 mg/dL) can raise your risk of infections and slow your recovery, so you will need to focus on controlling your diabetes during the weeks before surgery. . Make sure that the doctor who takes care of your diabetes knows about your planned surgery including the date and location.  How do I manage my blood sugar before surgery? . Check your blood sugar at least 4 times a day, starting 2 days before surgery, to make sure that the level is not too high or low. o Check your blood sugar the morning of your surgery when you wake up and every 2 hours until you get to the Short Stay unit. . If your blood sugar is less than 70 mg/dL, you will need to treat for low blood sugar: o Do not take insulin. o Treat a low blood sugar (less than 70 mg/dL) with  cup of clear juice (cranberry or apple), 4 glucose tablets, OR glucose gel. o Recheck blood sugar in 15 minutes after treatment (to make sure it is greater than 70 mg/dL). If your blood sugar is not greater than 70 mg/dL on recheck, call [phone] for further instructions. . Report your blood sugar to the short stay nurse when you get to Short Stay.  . If you are admitted to the hospital after surgery: o Your blood sugar will be checked by the staff and you will probably be given insulin after surgery (instead  of oral diabetes medicines) to make sure you have good blood sugar levels. o The goal for blood sugar control after surgery is 80-180 mg/dL.     The Morning of Surgery  Do not wear jewelry, make-up or nail polish.  Do not wear lotions, powders, or perfumes/colognes, or deodorant  Do not shave 48 hours prior to surgery.  Men may shave face and neck.  Do not bring valuables to the hospital.  [facility] is not responsible for any belongings or valuables.  If you are a smoker, DO NOT Smoke 24 hours prior to surgery IF you wear a CPAP at night please bring your mask, tubing, and machine the morning of  surgery   Remember that you must have someone to transport you home after your surgery, and remain with you for 24 hours if you are discharged the same day.   Contacts, glasses, hearing aids, dentures or bridgework may not be worn into surgery.    Leave your suitcase in the car.  After surgery it may be brought to your room.  For patients admitted to the hospital, discharge time will be determined by your treatment team.  Patients discharged the day of surgery will not be allowed to drive home.    Special instructions:   [facility]- Preparing For Surgery  Before surgery, you can play an important role. Because skin is not sterile, your skin needs to be as free of germs as possible. You can reduce the number of germs on your skin by washing with CHG (chlorahexidine gluconate) Soap before surgery.  CHG is an antiseptic cleaner which kills germs and bonds with the skin to continue killing germs even after washing.    Oral Hygiene is also important to reduce your risk of infection.  Remember - BRUSH YOUR TEETH THE MORNING OF SURGERY WITH YOUR REGULAR TOOTHPASTE  Please do not use if you have an allergy to CHG or antibacterial soaps. If your skin becomes reddened/irritated stop using the CHG.  Do not shave (including legs and underarms) for at least 48 hours prior to first CHG shower. It is OK to shave your face.  Please follow these instructions carefully.   1. Shower the NIGHT BEFORE SURGERY and the MORNING OF SURGERY with CHG Soap.   2. If you chose to wash your hair, wash your hair first as usual with your normal shampoo.  3. After you shampoo, rinse your hair and body thoroughly to remove the shampoo.  4. Use CHG as you would any other liquid soap. You can apply CHG directly to the skin and wash gently with a scrungie or a clean washcloth.   5. Apply the CHG Soap to your body ONLY FROM THE NECK DOWN.  Do not use on open wounds or open sores. Avoid contact with your eyes, ears,  mouth and genitals (private parts). Wash Face and genitals (private parts)  with your normal soap.   6. Wash thoroughly, paying special attention to the area where your surgery will be performed.  7. Thoroughly rinse your body with warm water from the neck down.  8. DO NOT shower/wash with your normal soap after using and rinsing off the CHG Soap.  9. Pat yourself dry with a CLEAN TOWEL.  10. Wear CLEAN PAJAMAS to bed the night before surgery, wear comfortable clothes the morning of surgery  11. Place CLEAN SHEETS on your bed the night of your first shower and DO NOT SLEEP WITH PETS.    Day of  Surgery:  Do not apply any deodorants/lotions.  Please wear clean clothes to the hospital/surgery center.   Remember to brush your teeth WITH YOUR REGULAR TOOTHPASTE.

## 2018-10-06 ENCOUNTER — Other Ambulatory Visit (HOSPITAL_COMMUNITY)
Admission: RE | Admit: 2018-10-06 | Discharge: 2018-10-06 | Disposition: A | Discharge status: Home / Self Care | Payer: Medicare HMO | Source: Ambulatory Visit | Attending: General Surgery | Admitting: General Surgery

## 2018-10-06 ENCOUNTER — Other Ambulatory Visit: Payer: Self-pay

## 2018-10-06 ENCOUNTER — Encounter

## 2018-10-06 ENCOUNTER — Encounter (HOSPITAL_COMMUNITY)
Admission: RE | Admit: 2018-10-06 | Discharge: 2018-10-06 | Disposition: A | Discharge status: Home / Self Care | Payer: Medicare HMO | Source: Ambulatory Visit | Attending: General Surgery | Admitting: General Surgery

## 2018-10-06 ENCOUNTER — Encounter (HOSPITAL_COMMUNITY): Payer: Self-pay

## 2018-10-06 DIAGNOSIS — K8010 Calculus of gallbladder with chronic cholecystitis without obstruction: Secondary | ICD-10-CM | POA: Diagnosis not present

## 2018-10-06 DIAGNOSIS — Z881 Allergy status to other antibiotic agents status: Secondary | ICD-10-CM | POA: Diagnosis not present

## 2018-10-06 DIAGNOSIS — I2510 Atherosclerotic heart disease of native coronary artery without angina pectoris: Secondary | ICD-10-CM | POA: Diagnosis not present

## 2018-10-06 DIAGNOSIS — N281 Cyst of kidney, acquired: Secondary | ICD-10-CM | POA: Diagnosis not present

## 2018-10-06 DIAGNOSIS — K811 Chronic cholecystitis: Secondary | ICD-10-CM | POA: Diagnosis present

## 2018-10-06 DIAGNOSIS — I10 Essential (primary) hypertension: Secondary | ICD-10-CM | POA: Diagnosis not present

## 2018-10-06 DIAGNOSIS — E7800 Pure hypercholesterolemia, unspecified: Secondary | ICD-10-CM | POA: Diagnosis not present

## 2018-10-06 DIAGNOSIS — K828 Other specified diseases of gallbladder: Secondary | ICD-10-CM | POA: Diagnosis not present

## 2018-10-06 DIAGNOSIS — E119 Type 2 diabetes mellitus without complications: Secondary | ICD-10-CM | POA: Diagnosis not present

## 2018-10-06 DIAGNOSIS — Z7901 Long term (current) use of anticoagulants: Secondary | ICD-10-CM | POA: Diagnosis not present

## 2018-10-06 DIAGNOSIS — Z79899 Other long term (current) drug therapy: Secondary | ICD-10-CM | POA: Diagnosis not present

## 2018-10-06 DIAGNOSIS — I700 Atherosclerosis of aorta: Secondary | ICD-10-CM | POA: Diagnosis not present

## 2018-10-06 DIAGNOSIS — Z951 Presence of aortocoronary bypass graft: Secondary | ICD-10-CM | POA: Diagnosis not present

## 2018-10-06 DIAGNOSIS — I4891 Unspecified atrial fibrillation: Secondary | ICD-10-CM | POA: Diagnosis not present

## 2018-10-06 DIAGNOSIS — Z1159 Encounter for screening for other viral diseases: Secondary | ICD-10-CM | POA: Insufficient documentation

## 2018-10-06 DIAGNOSIS — F419 Anxiety disorder, unspecified: Secondary | ICD-10-CM | POA: Diagnosis not present

## 2018-10-06 DIAGNOSIS — Z7984 Long term (current) use of oral hypoglycemic drugs: Secondary | ICD-10-CM | POA: Diagnosis not present

## 2018-10-06 DIAGNOSIS — Z01812 Encounter for preprocedural laboratory examination: Secondary | ICD-10-CM | POA: Insufficient documentation

## 2018-10-06 DIAGNOSIS — Z87891 Personal history of nicotine dependence: Secondary | ICD-10-CM | POA: Diagnosis not present

## 2018-10-06 LAB — CBC WITH DIFFERENTIAL/PLATELET
Abs Immature Granulocytes: 0.07 K/uL (ref 0.00–0.07)
Basophils Absolute: 0.1 K/uL (ref 0.0–0.1)
Basophils Relative: 1 %
Eosinophils Absolute: 0.3 K/uL (ref 0.0–0.5)
Eosinophils Relative: 4 %
HCT: 37.2 % — ABNORMAL LOW (ref 39.0–52.0)
Hemoglobin: 11.7 g/dL — ABNORMAL LOW (ref 13.0–17.0)
Immature Granulocytes: 1 %
Lymphocytes Relative: 25 %
Lymphs Abs: 2.1 K/uL (ref 0.7–4.0)
MCH: 26.7 pg (ref 26.0–34.0)
MCHC: 31.5 g/dL (ref 30.0–36.0)
MCV: 84.9 fL (ref 80.0–100.0)
Monocytes Absolute: 0.8 K/uL (ref 0.1–1.0)
Monocytes Relative: 9 %
Neutro Abs: 5.1 K/uL (ref 1.7–7.7)
Neutrophils Relative %: 60 %
Platelets: 257 K/uL (ref 150–400)
RBC: 4.38 MIL/uL (ref 4.22–5.81)
RDW: 16.8 % — ABNORMAL HIGH (ref 11.5–15.5)
WBC: 8.4 K/uL (ref 4.0–10.5)
nRBC: 0 % (ref 0.0–0.2)

## 2018-10-06 LAB — PROTIME-INR
INR: 0.9 (ref 0.8–1.2)
Prothrombin Time: 12 seconds (ref 11.4–15.2)

## 2018-10-06 LAB — HEMOGLOBIN A1C
Hgb A1c MFr Bld: 6.1 % — ABNORMAL HIGH (ref 4.8–5.6)
Mean Plasma Glucose: 128.37 mg/dL

## 2018-10-06 LAB — COMPREHENSIVE METABOLIC PANEL
ALT: 25 U/L (ref 0–44)
AST: 32 U/L (ref 15–41)
Albumin: 3.7 g/dL (ref 3.5–5.0)
Alkaline Phosphatase: 51 U/L (ref 38–126)
Anion gap: 14 (ref 5–15)
BUN: 18 mg/dL (ref 8–23)
CO2: 21 mmol/L — ABNORMAL LOW (ref 22–32)
Calcium: 9.5 mg/dL (ref 8.9–10.3)
Chloride: 101 mmol/L (ref 98–111)
Creatinine, Ser: 1.31 mg/dL — ABNORMAL HIGH (ref 0.61–1.24)
GFR calc Af Amer: >60 mL/min (ref >60)
GFR calc non Af Amer: 53 mL/min — ABNORMAL LOW (ref >60)
Glucose, Bld: 122 mg/dL — ABNORMAL HIGH (ref 70–99)
Potassium: 4.7 mmol/L (ref 3.5–5.1)
Sodium: 136 mmol/L (ref 135–145)
Total Bilirubin: 0.7 mg/dL (ref 0.3–1.2)
Total Protein: 6.6 g/dL (ref 6.5–8.1)

## 2018-10-06 LAB — GLUCOSE, CAPILLARY: Glucose-Capillary: 140 mg/dL — ABNORMAL HIGH (ref 70–99)

## 2018-10-06 LAB — SARS CORONAVIRUS 2 BY RT PCR (HOSPITAL ORDER, PERFORMED IN ~~LOC~~ HOSPITAL LAB): SARS Coronavirus 2: NEGATIVE

## 2018-10-06 MED ORDER — BUPIVACAINE LIPOSOME 1.3 % IJ SUSP
20.0000 mL | INTRAMUSCULAR | Status: DC
Start: 2018-10-07 — End: 2018-10-07
  Filled 2018-10-06: qty 20

## 2018-10-06 MED ORDER — CEFAZOLIN SODIUM-DEXTROSE 2-4 GM/100ML-% IV SOLN
2.0000 g | INTRAVENOUS | Status: AC
Start: 2018-10-07 — End: 2018-10-07
  Administered 2018-10-07: 2 g via INTRAVENOUS
  Filled 2018-10-06: qty 100

## 2018-10-06 NOTE — H&P (Signed)
Calvin FantasiaLarry F Lackman Location: [facility] Patient #: [id number] DOB: 1942-11-16 Single / Language: [place] / Race: White Male   History of Present Illness The patient is a 73 year old male who presents with an unspecified infection. Patient is a 73 year old male who our service saw in the hospital for cholecystitis and adjacent liver abscess. He received a percutaneous cholecystostomy tube 1/12. Patient's history is notable for having a cath in the fall demonstrating 4 blockages. He was scheduled for a CABG and then was found to have a tooth abscess. He eventually had CABG x4 on December 2. His postop course was complicated by atrial fibrillation and by aspiration pneumonia. He went home for short period of time but came back quite ill. He was seen to have a large liver abscess as well as an inflamed appearing gallbladder with a thickened gallbladder wall. Neoplasm was not excluded.   He got MRI which showed resolution of abscess.  No mass was seen.  He is annoyed that he has had to have surgery delayed due to COVID.  He had to have perc drain switched out.    Ct abd/pelvis 05/09/18 IMPRESSION: 1. Ill-defined multiloculated collection at the gallbladder fossa measures 12.5 x 10.2 x 7.0 cm. This most likely represents complicated cholecystitis and hepatic abscess. Secondary inflammatory changes are present in the duodenum and colon. Gallbladder neoplasm is considered less likely. 2. Descending and sigmoid colonic diverticular disease with mild inflammatory changes in the sigmoid consistent with early diverticulitis. This likely accounts for the left lower quadrant pain. 3. Aortic Atherosclerosis (ICD10-I70.0). 4. Multiple left renal cysts.   MRI 07/01/2018 IMPRESSION: 1. Cholecystostomy tube noted. 2. 0.9 cm stone in the neck of the gallbladder. There is currently no abnormal distention of the gallbladder, and I doubt that this stone is obstructive. 3. Two additional  filling defects along the gallbladder, which may be enhancing. The larger is 8 mm in diameter and may represent a gallbladder polyp. Inflammatory polyp not excluded. Polyps in this size typically warrant at least surveillance. 4. Mild wall thickening in the gallbladder, although this could be from chronic irritation related to the cholecystostomy tube rather than necessarily representing a separate inflammatory process. No surrounding abscess in the pericholecystic space or liver. 5. No findings of choledocholithiasis. 6. Small benign renal left adrenal lesions as noted above. 7.  Aortic Atherosclerosis (ICD10-I70.0). 8. Scattered diverticula of the descending colon.  Past Surgical History Anal Fissure Repair  Bypass Surgery for Poor Blood Flow to Legs  Liver Surgery  Tonsillectomy   Diagnostic Studies History Colonoscopy  1-5 years ago  Allergies Cipro *FLUOROQUINOLONES*  Difficulty breathing. Allergies Reconciled   Medication History metFORMIN HCl (1000MG  Tablet, Oral) Active. Losartan Potassium (50MG  Tablet, Oral) Active. Eliquis (5MG  Tablet, Oral) Active. Nitroglycerin (0.4MG  Tab Sublingual, Sublingual) Active. Pravastatin Sodium (40MG  Tablet, Oral) Active. Furosemide (40MG  Tablet, Oral) Active. Amoxicillin-Pot Clavulanate (500-125MG  Tablet, Oral) Active. Metoprolol Tartrate (25MG  Tablet, Oral) Active. Amiodarone HCl (200MG  Tablet, Oral) Active. amLODIPine Besylate (5MG  Tablet, Oral) Active. Finasteride (5MG  Tablet, Oral) Active. Amoxicillin (500MG  Capsule, Oral) Active. Medications Reconciled  Social History Alcohol use  Occasional alcohol use. Caffeine use  Coffee. No drug use  Tobacco use  Former smoker.  Family History  Colon Cancer  Father. Colon Polyps  Father.  Other Problems Atrial Fibrillation  Diabetes Mellitus  Hepatitis  High blood pressure  Hypercholesterolemia     Review of Systems General Present- Appetite  Loss, Fatigue and Weight Loss. Not Present- Chills, Fever, Night Sweats and Weight  Gain. Skin Not Present- Change in Wart/Mole, Dryness, Hives, Jaundice, New Lesions, Non-Healing Wounds, Rash and Ulcer. HEENT Not Present- Earache, Hearing Loss, Hoarseness, Nose Bleed, Oral Ulcers, Ringing in the Ears, Seasonal Allergies, Sinus Pain, Sore Throat, Visual Disturbances, Wears glasses/contact lenses and Yellow Eyes. Respiratory Present- Snoring. Not Present- Bloody sputum, Chronic Cough, Difficulty Breathing and Wheezing. Breast Not Present- Breast Mass, Breast Pain, Nipple Discharge and Skin Changes. Cardiovascular Present- Palpitations and Shortness of Breath. Not Present- Chest Pain, Difficulty Breathing Lying Down, Leg Cramps, Rapid Heart Rate and Swelling of Extremities. Gastrointestinal Present- Chronic diarrhea and Nausea. Not Present- Abdominal Pain, Bloating, Bloody Stool, Change in Bowel Habits, Constipation, Difficulty Swallowing, Excessive gas, Gets full quickly at meals, Hemorrhoids, Indigestion, Rectal Pain and Vomiting. Male Genitourinary Present- Frequency and Nocturia. Not Present- Blood in Urine, Change in Urinary Stream, Impotence, Painful Urination, Urgency and Urine Leakage.  Vitals  Weight: 239.25 lb Height: 72in Body Surface Area: 2.3 m Body Mass Index: 32.45 kg/m  Temp.: 97.58F(Temporal)  Pulse: 65 (Regular)  P.OX: 95% (Room air) BP: 110/62 (Sitting, Left Arm, Standard)       Physical Exam  General Mental Status-Alert. General Appearance-Consistent with stated age. Hydration-Well hydrated. Voice-Normal.  Integumentary Note: pale   Head and Neck Head-normocephalic, atraumatic with no lesions or palpable masses.  Eye Sclera/Conjunctiva - Bilateral-No scleral icterus.  Chest and Lung Exam Chest and lung exam reveals -quiet, even and easy respiratory effort with no use of accessory muscles. Inspection Chest Wall - Normal. Back -  normal.  Breast - Did not examine.  Cardiovascular Cardiovascular examination reveals -normal pedal pulses bilaterally. Note: regular rate and rhythm  Abdomen Inspection-Inspection Normal. Palpation/Percussion Palpation and Percussion of the abdomen reveal - Soft, Non Tender, No Rebound tenderness, No Rigidity (guarding) and No hepatosplenomegaly. Note: perc chole tube in place. site non tender. transparent green bile in bag.   Peripheral Vascular Upper Extremity Inspection - Bilateral - Normal - No Clubbing, No Cyanosis, No Edema, Pulses Intact. Lower Extremity Palpation - Edema - Bilateral - No edema.  Neurologic Neurologic evaluation reveals -alert and oriented x 3 with no impairment of recent or remote memory. Mental Status-Normal.  Musculoskeletal Global Assessment -Note: no gross deformities.  Normal Exam - Left-Upper Extremity Strength Normal and Lower Extremity Strength Normal. Normal Exam - Right-Upper Extremity Strength Normal and Lower Extremity Strength Normal.  Lymphatic Head & Neck  General Head & Neck Lymphatics: Bilateral - Description - Normal. Axillary  General Axillary Region: Bilateral - Description - Normal. Tenderness - Non Tender.    Assessment & Plan  CHOLECYSTITIS, ACUTE (K81.0) Impression: Plan cholecystectomy and removal of perc chole drain.  Discussed risks.    He is at higher risk of requiring an open operation. I would like this with another surgeon as it is expected to be more difficult.  Required change out of tube.    GALLBLADDER ANOMALY (Q44.1) Looks like only abscess.  MR showed no evidence of mass, just small polyp.  Will get pathology from removal.    LIVER ABSCESS (K75.0) Impression: Treatment per infectious disease. MRI should evaluate whether this has completely resolved.

## 2018-10-06 NOTE — Anesthesia Preprocedure Evaluation (Addendum)
Anesthesia Evaluation  Patient identified by MRN, date of birth, ID band Patient awake    Reviewed: Allergy & Precautions, NPO status , Patient's Chart, lab work & pertinent test results  Airway Mallampati: II  TM Distance: <3 FB Neck ROM: Full    Dental  (+) Teeth Intact, Dental Advisory Given   Pulmonary former smoker,    Pulmonary exam normal breath sounds clear to auscultation       Cardiovascular hypertension, Pt. on medications and Pt. on home beta blockers + CAD and + CABG  (-) Past MI and (-) Cardiac Stents Normal cardiovascular exam+ dysrhythmias Atrial Fibrillation  Rhythm:Regular Rate:Normal     Neuro/Psych PSYCHIATRIC DISORDERS Anxiety negative neurological ROS     GI/Hepatic negative GI ROS, Chronic cholecystitis   Endo/Other  diabetes, Type 2, Oral Hypoglycemic AgentsObesity   Renal/GU negative Renal ROS     Musculoskeletal negative musculoskeletal ROS (+)   Abdominal   Peds  Hematology  (+) Blood dyscrasia (Eliquis), ,   Anesthesia Other Findings Day of surgery medications reviewed with the patient.  Reproductive/Obstetrics                          Anesthesia Physical Anesthesia Plan  ASA: III  Anesthesia Plan: General   Post-op Pain Management:    Induction: Intravenous  PONV Risk Score and Plan: 3 and Dexamethasone, Ondansetron and Treatment may vary due to age or medical condition  Airway Management Planned: Oral ETT  Additional Equipment:   Intra-op Plan:   Post-operative Plan: Extubation in OR  Informed Consent: I have reviewed the patients History and Physical, chart, labs and discussed the procedure including the risks, benefits and alternatives for the proposed anesthesia with the patient or authorized representative who has indicated his/her understanding and acceptance.     Dental advisory given  Plan Discussed with: CRNA  Anesthesia Plan  Comments: (CAD s/p CABG x4 04/01/2018. Procedure was complicated by postop afib/flutter and subsequent liver abscess requiring drainage (12 cm abscess in the gallbladder fossa and the liver. Patient had a placement of a 10 F percutaneous cholecystostomy tube by IR on 1/12). He had followup with Dr. Gwenlyn Found 07/15/18. Discussed at that time he would likely need cardioversion for atrial flutter but it would be scheduled around his need for cholecystectomy. He was cleared by Dr. Gwenlyn Found in staff message to Dr. Barry Dienes 07/18/18 stating "Cleared for his cholecystectomy.  Needs to stop Eliquis 2 days in advance and restart after that when you feel appropriate.  He will need 4 weeks of uninterrupted Eliquis before can consider cardioversion."   2nd PIV after induction, possible arterial line after induction.)     Anesthesia Quick Evaluation

## 2018-10-06 NOTE — Progress Notes (Signed)
PCP - Moogali Arvind Cardiologist - Dr. Gwenlyn Found  Chest x-ray - 06-04-18 EKG - 07-15-18   ECHO - 02-21-18 Cardiac Cath - 02-27-18  SA - yes, does not wear CPAP  DM - Type 2 Fasting Blood Sugar - 95-115   Blood Thinner Instructions: pt stated he stopped Eliquis on 10-04-18 Aspirin Instructions: follow surgeon's instructions  Anesthesia review: yes, heart history  Patient denies shortness of breath, fever, cough and chest pain at PAT appointment   Patient verbalized understanding of instructions that were given to them at the PAT appointment. Patient was also instructed that they will need to review over the PAT instructions again at home before surgery.

## 2018-10-07 ENCOUNTER — Ambulatory Visit (HOSPITAL_COMMUNITY): Payer: Medicare HMO | Admitting: Registered Nurse

## 2018-10-07 ENCOUNTER — Encounter (HOSPITAL_COMMUNITY): Payer: Self-pay

## 2018-10-07 ENCOUNTER — Encounter (HOSPITAL_COMMUNITY)
Admission: RE | Disposition: A | Discharge status: Home / Self Care | Payer: Self-pay | Source: Home / Self Care | Attending: General Surgery

## 2018-10-07 ENCOUNTER — Other Ambulatory Visit: Payer: Self-pay

## 2018-10-07 ENCOUNTER — Ambulatory Visit (HOSPITAL_COMMUNITY)
Admission: RE | Admit: 2018-10-07 | Discharge: 2018-10-07 | Disposition: A | Discharge status: Home / Self Care | Payer: Medicare HMO | Attending: General Surgery | Admitting: General Surgery

## 2018-10-07 ENCOUNTER — Encounter

## 2018-10-07 DIAGNOSIS — Z951 Presence of aortocoronary bypass graft: Secondary | ICD-10-CM | POA: Insufficient documentation

## 2018-10-07 DIAGNOSIS — I10 Essential (primary) hypertension: Secondary | ICD-10-CM | POA: Insufficient documentation

## 2018-10-07 DIAGNOSIS — K8010 Calculus of gallbladder with chronic cholecystitis without obstruction: Secondary | ICD-10-CM | POA: Insufficient documentation

## 2018-10-07 DIAGNOSIS — Z1159 Encounter for screening for other viral diseases: Secondary | ICD-10-CM | POA: Diagnosis not present

## 2018-10-07 DIAGNOSIS — Z87891 Personal history of nicotine dependence: Secondary | ICD-10-CM | POA: Insufficient documentation

## 2018-10-07 DIAGNOSIS — Z7984 Long term (current) use of oral hypoglycemic drugs: Secondary | ICD-10-CM | POA: Insufficient documentation

## 2018-10-07 DIAGNOSIS — Z79899 Other long term (current) drug therapy: Secondary | ICD-10-CM | POA: Insufficient documentation

## 2018-10-07 DIAGNOSIS — I2510 Atherosclerotic heart disease of native coronary artery without angina pectoris: Secondary | ICD-10-CM | POA: Insufficient documentation

## 2018-10-07 DIAGNOSIS — F419 Anxiety disorder, unspecified: Secondary | ICD-10-CM | POA: Insufficient documentation

## 2018-10-07 DIAGNOSIS — I700 Atherosclerosis of aorta: Secondary | ICD-10-CM | POA: Insufficient documentation

## 2018-10-07 DIAGNOSIS — E119 Type 2 diabetes mellitus without complications: Secondary | ICD-10-CM | POA: Insufficient documentation

## 2018-10-07 DIAGNOSIS — N281 Cyst of kidney, acquired: Secondary | ICD-10-CM | POA: Insufficient documentation

## 2018-10-07 DIAGNOSIS — K828 Other specified diseases of gallbladder: Secondary | ICD-10-CM | POA: Insufficient documentation

## 2018-10-07 DIAGNOSIS — I4891 Unspecified atrial fibrillation: Secondary | ICD-10-CM | POA: Insufficient documentation

## 2018-10-07 DIAGNOSIS — E7800 Pure hypercholesterolemia, unspecified: Secondary | ICD-10-CM | POA: Insufficient documentation

## 2018-10-07 DIAGNOSIS — Z7901 Long term (current) use of anticoagulants: Secondary | ICD-10-CM | POA: Insufficient documentation

## 2018-10-07 DIAGNOSIS — Z881 Allergy status to other antibiotic agents status: Secondary | ICD-10-CM | POA: Insufficient documentation

## 2018-10-07 HISTORY — PX: CHOLECYSTECTOMY: SHX55

## 2018-10-07 LAB — GLUCOSE, CAPILLARY: Glucose-Capillary: 169 mg/dL — ABNORMAL HIGH (ref 70–99)

## 2018-10-07 SURGERY — LAPAROSCOPIC CHOLECYSTECTOMY
Anesthesia: General | Site: Abdomen

## 2018-10-07 MED ORDER — GABAPENTIN 300 MG PO CAPS
ORAL | Status: AC
Start: 2018-10-07 — End: 2018-10-07
  Administered 2018-10-07: 07:00:00 300 mg via ORAL
  Filled 2018-10-07: qty 1

## 2018-10-07 MED ORDER — FENTANYL CITRATE (PF) 100 MCG/2ML IJ SOLN
25.0000 ug | INTRAMUSCULAR | Status: DC | PRN
Start: 2018-10-07 — End: 2018-10-07
  Administered 2018-10-07 (×2): 25 ug via INTRAVENOUS

## 2018-10-07 MED ORDER — EPHEDRINE 5 MG/ML INJ
INTRAVENOUS | Status: AC
Start: 2018-10-07 — End: ?
  Filled 2018-10-07: qty 10

## 2018-10-07 MED ORDER — ENSURE PRE-SURGERY PO LIQD
296.0000 mL | Freq: Once | ORAL | Status: DC
Start: 2018-10-07 — End: 2018-10-07
  Filled 2018-10-07: qty 296

## 2018-10-07 MED ORDER — OXYCODONE HCL 5 MG PO TABS
ORAL | Status: DC
Start: 2018-10-07 — End: 2018-10-07
  Filled 2018-10-07: qty 1

## 2018-10-07 MED ORDER — LACTATED RINGERS IV SOLN
INTRAVENOUS | Status: DC
Start: 2018-10-07 — End: 2018-10-07
  Administered 2018-10-07: 07:00:00 via INTRAVENOUS

## 2018-10-07 MED ORDER — DEXAMETHASONE SODIUM PHOSPHATE 10 MG/ML IJ SOLN
INTRAMUSCULAR | Status: AC
Start: 2018-10-07 — End: ?
  Filled 2018-10-07: qty 1

## 2018-10-07 MED ORDER — OXYCODONE HCL 5 MG PO TABS
5.0000 mg | Freq: Once | ORAL | Status: AC
Start: 2018-10-07 — End: 2018-10-07
  Administered 2018-10-07: 10:00:00 5 mg via ORAL

## 2018-10-07 MED ORDER — PROPOFOL 10 MG/ML IV BOLUS
INTRAVENOUS | Status: AC
Start: 2018-10-07 — End: ?
  Filled 2018-10-07: qty 20

## 2018-10-07 MED ORDER — GABAPENTIN 300 MG PO CAPS
300.0000 mg | ORAL | Status: AC
Start: 2018-10-07 — End: 2018-10-07
  Administered 2018-10-07: 07:00:00 300 mg via ORAL

## 2018-10-07 MED ORDER — FENTANYL CITRATE (PF) 250 MCG/5ML IJ SOLN
INTRAMUSCULAR | Status: AC
Start: 2018-10-07 — End: ?
  Filled 2018-10-07: qty 5

## 2018-10-07 MED ORDER — SUCCINYLCHOLINE CHLORIDE 20 MG/ML IJ SOLN
INTRAMUSCULAR | Status: DC | PRN
Start: 2018-10-07 — End: 2018-10-07
  Administered 2018-10-07: 100 mg via INTRAVENOUS

## 2018-10-07 MED ORDER — GLYCOPYRROLATE 0.2 MG/ML IJ SOLN
INTRAMUSCULAR | Status: DC | PRN
Start: 2018-10-07 — End: 2018-10-07
  Administered 2018-10-07: 0.1 mg via INTRAVENOUS

## 2018-10-07 MED ORDER — ONDANSETRON HCL 4 MG/2ML IJ SOLN
INTRAMUSCULAR | Status: AC
Start: 2018-10-07 — End: ?
  Filled 2018-10-07: qty 2

## 2018-10-07 MED ORDER — SUGAMMADEX SODIUM 200 MG/2ML IV SOLN
INTRAVENOUS | Status: DC | PRN
Start: 2018-10-07 — End: 2018-10-07
  Administered 2018-10-07: 100 mg via INTRAVENOUS
  Administered 2018-10-07: 150 mg via INTRAVENOUS

## 2018-10-07 MED ORDER — SUCCINYLCHOLINE CHLORIDE 200 MG/10ML IV SOSY
INTRAVENOUS | Status: AC
Start: 2018-10-07 — End: ?
  Filled 2018-10-07: qty 10

## 2018-10-07 MED ORDER — EPHEDRINE SULFATE-NACL 50-0.9 MG/10ML-% IV SOSY
INTRAVENOUS | Status: DC | PRN
Start: 2018-10-07 — End: 2018-10-07
  Administered 2018-10-07: 10 mg via INTRAVENOUS

## 2018-10-07 MED ORDER — HEMOSTATIC AGENTS (NO CHARGE) OPTIME
TOPICAL | Status: DC | PRN
Start: 2018-10-07 — End: 2018-10-07
  Administered 2018-10-07: 1 via TOPICAL

## 2018-10-07 MED ORDER — PHENYLEPHRINE HCL (PRESSORS) 10 MG/ML IV SOLN
INTRAVENOUS | Status: DC | PRN
Start: 2018-10-07 — End: 2018-10-07
  Administered 2018-10-07: 30 ug/min via INTRAVENOUS

## 2018-10-07 MED ORDER — ROCURONIUM BROMIDE 50 MG/5ML IV SOSY
INTRAVENOUS | Status: DC | PRN
Start: 2018-10-07 — End: 2018-10-07
  Administered 2018-10-07: 20 mg via INTRAVENOUS
  Administered 2018-10-07: 50 mg via INTRAVENOUS

## 2018-10-07 MED ORDER — GLYCOPYRROLATE PF 0.2 MG/ML IJ SOSY
INTRAMUSCULAR | Status: AC
Start: 2018-10-07 — End: ?
  Filled 2018-10-07: qty 1

## 2018-10-07 MED ORDER — ONDANSETRON HCL 4 MG/2ML IJ SOLN
INTRAMUSCULAR | Status: DC | PRN
Start: 2018-10-07 — End: 2018-10-07
  Administered 2018-10-07: 4 mg via INTRAVENOUS

## 2018-10-07 MED ORDER — BUPIVACAINE-EPINEPHRINE (PF) 0.25% -1:200000 IJ SOLN
INTRAMUSCULAR | Status: AC
Start: 2018-10-07 — End: ?
  Filled 2018-10-07: qty 30

## 2018-10-07 MED ORDER — ONDANSETRON HCL 4 MG/2ML IJ SOLN
4.0000 mg | Freq: Once | INTRAMUSCULAR | Status: DC | PRN
Start: 2018-10-07 — End: 2018-10-07

## 2018-10-07 MED ORDER — ACETAMINOPHEN 500 MG PO TABS
1000.0000 mg | ORAL | Status: AC
Start: 2018-10-07 — End: 2018-10-07
  Administered 2018-10-07: 1000 mg via ORAL

## 2018-10-07 MED ORDER — CHLORHEXIDINE GLUCONATE CLOTH 2 % EX PADS
6.0000 | Freq: Once | CUTANEOUS | Status: DC
Start: 2018-10-07 — End: 2018-10-07

## 2018-10-07 MED ORDER — ACETAMINOPHEN 500 MG PO TABS
ORAL | Status: AC
Start: 2018-10-07 — End: 2018-10-07
  Administered 2018-10-07: 07:00:00 1000 mg via ORAL
  Filled 2018-10-07: qty 2

## 2018-10-07 MED ORDER — SODIUM CHLORIDE 0.9 % IR SOLN
Status: DC | PRN
Start: 2018-10-07 — End: 2018-10-07
  Administered 2018-10-07: 1000 mL

## 2018-10-07 MED ORDER — FENTANYL CITRATE (PF) 100 MCG/2ML IJ SOLN
INTRAMUSCULAR | Status: DC | PRN
Start: 2018-10-07 — End: 2018-10-07
  Administered 2018-10-07: 50 ug via INTRAVENOUS
  Administered 2018-10-07: 100 ug via INTRAVENOUS

## 2018-10-07 MED ORDER — DEXAMETHASONE SODIUM PHOSPHATE 10 MG/ML IJ SOLN
INTRAMUSCULAR | Status: DC | PRN
Start: 2018-10-07 — End: 2018-10-07
  Administered 2018-10-07: 5 mg via INTRAVENOUS

## 2018-10-07 MED ORDER — BUPIVACAINE-EPINEPHRINE (PF) 0.25% -1:200000 IJ SOLN
INTRAMUSCULAR | Status: DC | PRN
Start: 2018-10-07 — End: 2018-10-07
  Administered 2018-10-07: 15 mL

## 2018-10-07 MED ORDER — LIDOCAINE 2% (20 MG/ML) 5 ML SYRINGE
INTRAMUSCULAR | Status: AC
Start: 2018-10-07 — End: ?
  Filled 2018-10-07: qty 5

## 2018-10-07 MED ORDER — OXYCODONE HCL 5 MG PO TABS
5.0000 mg | Freq: Four times a day (QID) | ORAL | 0 refills | Status: DC | PRN
Start: 2018-10-07 — End: 2019-05-06

## 2018-10-07 MED ORDER — FENTANYL CITRATE (PF) 100 MCG/2ML IJ SOLN
INTRAMUSCULAR | Status: DC
Start: 2018-10-07 — End: 2018-10-07
  Filled 2018-10-07: qty 2

## 2018-10-07 MED ORDER — ROCURONIUM BROMIDE 10 MG/ML (PF) SYRINGE
INTRAVENOUS | Status: AC
Start: 2018-10-07 — End: ?
  Filled 2018-10-07: qty 10

## 2018-10-07 MED ORDER — LIDOCAINE 2% (20 MG/ML) 5 ML SYRINGE
INTRAMUSCULAR | Status: DC | PRN
Start: 2018-10-07 — End: 2018-10-07
  Administered 2018-10-07: 100 mg via INTRAVENOUS

## 2018-10-07 MED ORDER — PROPOFOL 10 MG/ML IV BOLUS
INTRAVENOUS | Status: DC | PRN
Start: 2018-10-07 — End: 2018-10-07
  Administered 2018-10-07: 150 mg via INTRAVENOUS

## 2018-10-07 MED ORDER — LIDOCAINE HCL (PF) 1 % IJ SOLN
INTRAMUSCULAR | Status: AC
Start: 2018-10-07 — End: ?
  Filled 2018-10-07: qty 30

## 2018-10-07 MED ORDER — 0.9 % SODIUM CHLORIDE (POUR BTL) OPTIME
TOPICAL | Status: DC | PRN
Start: 2018-10-07 — End: 2018-10-07
  Administered 2018-10-07: 08:00:00 1000 mL

## 2018-10-07 SURGICAL SUPPLY — 50 items
ADH SKN CLS APL DERMABOND .7 (GAUZE/BANDAGES/DRESSINGS) ×2
APPLIER CLIP ROT 10 11.4 M/L (STAPLE) ×4
APR CLP MED LRG 11.4X10 (STAPLE) ×2
BAG SPEC RTRVL 10 TROC 200 (ENDOMECHANICALS) ×2
BLADE CLIPPER SURG (BLADE)
CANISTER SUCT 3000ML PPV (MISCELLANEOUS) ×4
CHLORAPREP W/TINT 26ML (MISCELLANEOUS) ×4
CLIP VESOLOCK XL 6/CT (CLIP)
CLOSURE WOUND 1/2 X4 (GAUZE/BANDAGES/DRESSINGS) ×1
COVER MAYO STAND STRL (DRAPES) ×4
COVER SURGICAL LIGHT HANDLE (MISCELLANEOUS) ×4
COVER WAND RF STERILE (DRAPES) ×4
DERMABOND ADVANCED (GAUZE/BANDAGES/DRESSINGS) ×2
DERMABOND ADVANCED .7 DNX12 (GAUZE/BANDAGES/DRESSINGS) ×2
DRAPE C-ARM 42X72 X-RAY (DRAPES) ×4
DRAPE WARM FLUID 44X44 (DRAPES) ×4
DRSG TEGADERM 2-3/8X2-3/4 SM (GAUZE/BANDAGES/DRESSINGS) ×4
ELECT REM PT RETURN 9FT ADLT (ELECTROSURGICAL) ×4
FILTER SMOKE EVAC LAPAROSHD (FILTER)
GAUZE SPONGE 2X2 8PLY STRL LF (GAUZE/BANDAGES/DRESSINGS) ×2
GLOVE BIO SURGEON STRL SZ 6 (GLOVE) ×4
GLOVE INDICATOR 6.5 STRL GRN (GLOVE) ×4
GOWN STRL REUS W/ TWL LRG LVL3 (GOWN DISPOSABLE) ×4
GOWN STRL REUS W/TWL 2XL LVL3 (GOWN DISPOSABLE) ×4
GOWN STRL REUS W/TWL LRG LVL3 (GOWN DISPOSABLE) ×8
HEMOSTAT SNOW SURGICEL 2X4 (HEMOSTASIS) ×4
KIT BASIN OR (CUSTOM PROCEDURE TRAY) ×4
KIT TURNOVER KIT B (KITS) ×4
L-HOOK LAP DISP 36CM (ELECTROSURGICAL) ×4
NS IRRIG 1000ML POUR BTL (IV SOLUTION) ×4
PAD ARMBOARD 7.5X6 YLW CONV (MISCELLANEOUS) ×4
PENCIL BUTTON HOLSTER BLD 10FT (ELECTRODE) ×4
POUCH RETRIEVAL ECOSAC 10 (ENDOMECHANICALS) ×2
POUCH RETRIEVAL ECOSAC 10MM (ENDOMECHANICALS) ×2
SCISSORS LAP 5X35 DISP (ENDOMECHANICALS) ×4
SET CHOLANGIOGRAPH 5 50 .035 (SET/KITS/TRAYS/PACK) ×4
SET IRRIG TUBING LAPAROSCOPIC (IRRIGATION / IRRIGATOR) ×4
SET TUBE SMOKE EVAC HIGH FLOW (TUBING) ×4
SLEEVE ENDOPATH XCEL 5M (ENDOMECHANICALS) ×4
SPECIMEN JAR SMALL (MISCELLANEOUS) ×4
SPONGE GAUZE 2X2 STER 10/PKG (GAUZE/BANDAGES/DRESSINGS) ×2
STRIP CLOSURE SKIN 1/2X4 (GAUZE/BANDAGES/DRESSINGS) ×3
SUT MNCRL AB 4-0 PS2 18 (SUTURE) ×4
TOWEL OR 17X24 6PK STRL BLUE (TOWEL DISPOSABLE) ×4
TOWEL OR 17X26 10 PK STRL BLUE (TOWEL DISPOSABLE) ×4
TRAY LAPAROSCOPIC MC (CUSTOM PROCEDURE TRAY) ×4
TROCAR XCEL BLUNT TIP 100MML (ENDOMECHANICALS) ×4
TROCAR XCEL NON-BLD 11X100MML (ENDOMECHANICALS) ×4
TROCAR XCEL NON-BLD 5MMX100MML (ENDOMECHANICALS) ×4
WATER STERILE IRR 1000ML POUR (IV SOLUTION) ×4

## 2018-10-07 NOTE — Discharge Instructions (Signed)
Restart eliquis tomorrow.     [facility] Office Phone Number [phone]   POST OP INSTRUCTIONS  Always review your discharge instruction sheet given to you by the facility where your surgery was performed.  IF YOU HAVE DISABILITY OR FAMILY LEAVE FORMS, YOU MUST BRING THEM TO THE OFFICE FOR PROCESSING.  DO NOT GIVE THEM TO YOUR DOCTOR.  1. A prescription for pain medication may be given to you upon discharge.  Take your pain medication as prescribed, if needed.  If narcotic pain medicine is not needed, then you may take acetaminophen (Tylenol) or ibuprofen (Advil) as needed. 2. Take your usually prescribed medications unless otherwise directed 3. If you need a refill on your pain medication, please contact your pharmacy.  They will contact our office to request authorization.  Prescriptions will not be filled after 5pm or on week-ends. 4. You should eat very light the first 24 hours after surgery, such as soup, crackers, pudding, etc.  Resume your normal diet the day after surgery 5. It is common to experience some constipation if taking pain medication after surgery.  Increasing fluid intake and taking a stool softener will usually help or prevent this problem from occurring.  A mild laxative (Milk of Magnesia or Miralax) should be taken according to package directions if there are no bowel movements after 48 hours. 6. You may shower in 48 hours.  The surgical glue will flake off in 2-3 weeks.   7. ACTIVITIES:  No strenuous activity or heavy lifting for 1 week.   a. You may drive when you no longer are taking prescription pain medication, you can comfortably wear a seatbelt, and you can safely maneuver your car and apply brakes. b. RETURN TO WORK:  __________n/a_______________ Dennis Bast should see your doctor in the office for a follow-up appointment approximately three-four weeks after your surgery.    WHEN TO CALL YOUR DOCTOR: 1. Fever over 101.0 2. Nausea and/or  vomiting. 3. Extreme swelling or bruising. 4. Continued bleeding from incision. 5. Increased pain, redness, or drainage from the incision.  The clinic staff is available to answer your questions during regular business hours.  Please dont hesitate to call and ask to speak to one of the nurses for clinical concerns.  If you have a medical emergency, go to the nearest emergency room or call 911.  A surgeon from [facility] is always on call at the hospital.  For further questions, please visit [email or web address]

## 2018-10-07 NOTE — Interval H&P Note (Signed)
History and Physical Interval Note:  10/07/2018 7:29 AM  [name]  has presented today for surgery, with the diagnosis of chronic cholecystitis.  The various methods of treatment have been discussed with the patient and family. After consideration of risks, benefits and other options for treatment, the patient has consented to  Procedure(s): LAPAROSCOPIC CHOLECYSTECTOMY WITH POSSIBLE INTRAOPERATIVE CHOLANGIOGRAM (N/A) as a surgical intervention.  The patient's history has been reviewed, patient examined, no change in status, stable for surgery.  I have reviewed the patient's chart and labs.  Questions were answered to the patient's satisfaction.     [name]

## 2018-10-07 NOTE — Transfer of Care (Signed)
Immediate Anesthesia Transfer of Care Note  Patient: [name]  Procedure(s) Performed: Laparoscopic Cholecystectomy (N/A Abdomen)  Patient Location: PACU  Anesthesia Type:General  Level of Consciousness: awake, alert  and oriented  Airway & Oxygen Therapy: Patient Spontanous Breathing  Post-op Assessment: Report given to RN and Post -op Vital signs reviewed and stable  Post vital signs: Reviewed and stable  Last Vitals:  Vitals Value Taken Time  BP 147/75 10/07/2018  9:36 AM  Temp    Pulse 59 10/07/2018  9:37 AM  Resp 14 10/07/2018  9:37 AM  SpO2 95 % 10/07/2018  9:37 AM  Vitals shown include unvalidated device data.  Last Pain:  Vitals:   10/07/18 0637  TempSrc:   PainSc: 0-No pain      Patients Stated Pain Goal: 2 (10/23/92 8546)  Complications: No apparent anesthesia complications

## 2018-10-07 NOTE — Anesthesia Postprocedure Evaluation (Signed)
Anesthesia Post Note  Patient: [name]  Procedure(s) Performed: Laparoscopic Cholecystectomy (N/A Abdomen)     Patient location during evaluation: PACU Anesthesia Type: General Level of consciousness: awake and alert, awake and oriented Pain management: pain level controlled Vital Signs Assessment: post-procedure vital signs reviewed and stable Respiratory status: spontaneous breathing, nonlabored ventilation, respiratory function stable and patient connected to nasal cannula oxygen Cardiovascular status: blood pressure returned to baseline and stable Postop Assessment: no apparent nausea or vomiting Anesthetic complications: no    Last Vitals:  Vitals:   10/07/18 1020 10/07/18 1041  BP: 135/71 124/67  Pulse: 61 (!) 56  Resp: 14 16  Temp:  (!) 36.4 C  SpO2: 97% 97%    Last Pain:  Vitals:   10/07/18 1027  TempSrc:   PainSc: 4                  Catalina Gravel

## 2018-10-07 NOTE — Anesthesia Procedure Notes (Signed)
Procedure Name: Intubation Date/Time: 10/07/2018 7:42 AM Performed by: Trinna Post., CRNA Pre-anesthesia Checklist: Patient identified, Emergency Drugs available, Suction available, Patient being monitored and Timeout performed Patient Re-evaluated:Patient Re-evaluated prior to induction Oxygen Delivery Method: Circle system utilized Preoxygenation: Pre-oxygenation with 100% oxygen Induction Type: IV induction, Rapid sequence and Cricoid Pressure applied Laryngoscope Size: Mac and 4 Grade View: Grade I Tube type: Oral Tube size: 7.5 mm Number of attempts: 1 Airway Equipment and Method: Stylet Placement Confirmation: ETT inserted through vocal cords under direct vision,  positive ETCO2 and breath sounds checked- equal and bilateral Secured at: 23 cm Tube secured with: Tape Dental Injury: Teeth and Oropharynx as per pre-operative assessment

## 2018-10-07 NOTE — Op Note (Signed)
Laparoscopic Cholecystectomy  Indications: This patient presented with acute cholecystitis in the winter.  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  There was a question of a gallbladder wall mass on imaging.  Perc chole tube was placed to move him from acute post op period. He was originally scheduled in march, but due to COVID, he was delayed.    Pre-operative Diagnosis: acute cholecystitis and perc drain  Post-operative Diagnosis: Same  Surgeon: Almond LintBYERLY,Annahi Short   Assistants: Carman Chingoug Blackman, MD  Anesthesia: General endotracheal anesthesia and local  ASA Class: 3  Procedure Details  The patient was seen again in the Holding Room. The risks, benefits, complications, treatment options, and expected outcomes were discussed with the patient. The possibilities of  bleeding, recurrent infection, damage to nearby structures, the need for additional procedures, failure to diagnose a condition, the possible need to convert to an open procedure, and creating a complication requiring transfusion or operation were discussed with the patient. The likelihood of improving the patient's symptoms with return to their baseline status is good.    The patient and/or family concurred with the proposed plan, giving informed consent. The site of surgery properly noted. The patient was taken to Operating Room, and the procedure verified as Laparoscopic Cholecystectomy with possible Intraoperative Cholangiogram. A Time Out was held and the above information confirmed.  The drain was prepped into the wound.    Prior to the induction of general anesthesia, antibiotic prophylaxis was administered. General endotracheal anesthesia was then administered and tolerated well. After the induction, the abdomen was prepped with Chloraprep and draped in the sterile fashion. The patient was positioned in the supine position.  Local anesthetic agent was injected into the skin above the umbilicus and an incision  made. We dissected down to the abdominal fascia with blunt dissection.  The fascia was incised vertically and we entered the peritoneal cavity bluntly.  A pursestring suture of 0-Vicryl was placed around the fascial opening.  The Hasson cannula was inserted and secured with the stay suture.  Pneumoperitoneum was then created with CO2 and tolerated well without any adverse changes in the patient's vital signs. An 11-mm port was placed in the subxiphoid position.  Two 5-mm ports were placed in the right upper quadrant. All skin incisions were infiltrated with a local anesthetic agent before making the incision and placing the trocars.   We positioned the patient in reverse Trendelenburg, tilted slightly to the patient's left.  The gallbladder was not immediately visible.  The omentum was taken down with cautery and blunt dissection.  Eventually the fundus was identified, grasped and retracted cephalad.  The infundibulum was then identified.     Adhesions were lysed bluntly and with the electrocautery where indicated, taking care not to injure any adjacent organs or viscus. The infundibulum was grasped and retracted laterally, exposing the peritoneum overlying the triangle of Calot. This was then divided and exposed in a blunt fashion. A critical view of the cystic duct was obtained. The cystic duct was quite short and a bit inflamed, so it was not dissected more proximally, just high up near the infundibulum.   The cystic duct was clearly identified and bluntly dissected circumferentially. The cystic duct was ligated with a clip distally.  A clear cystic artery was not identified.  The locking weck clips were used on the duct.  The drain was removed from the abdominal wall.    The gallbladder was dissected from the liver bed in retrograde fashion  with the electrocautery. The gallbladder was removed and placed in an Endocatch bag.  The gallbladder and Endocatch bag were then removed through the umbilical port  site.  The liver bed was irrigated and inspected. Hemostasis was achieved with the electrocautery. Copious irrigation was utilized and was repeatedly aspirated until clear.  SNOW hemostatic agent was placed on the gallbladder fossa.    We again inspected the right upper quadrant for hemostasis.  Pneumoperitoneum was released as we removed the trocars.   The pursestring suture was used to close the umbilical fascia.  A second 0-0 vicryl was used to close the fascia.  4-0 Monocryl was used to close the skin.   The skin was cleaned and dry, and Dermabond was applied. The patient was then extubated and brought to the recovery room in stable condition. Instrument, sponge, and needle counts were correct at closure and at the conclusion of the case.   Findings: Very contracted, inflamed gallbladder.    Estimated Blood Loss: 100 mL         Drains: removed.            Specimens: Gallbladder to pathology       Complications: None; patient tolerated the procedure well.         Disposition: PACU - hemodynamically stable.         Condition: stable

## 2018-10-08 ENCOUNTER — Encounter (HOSPITAL_COMMUNITY): Payer: Self-pay | Admitting: General Surgery

## 2018-10-10 LAB — GLUCOSE, CAPILLARY: Glucose-Capillary: 102 mg/dL — ABNORMAL HIGH (ref 70–99)

## 2018-10-15 ENCOUNTER — Encounter

## 2018-10-15 ENCOUNTER — Ambulatory Visit: Payer: Medicare HMO | Admitting: Cardiovascular Disease

## 2018-10-31 ENCOUNTER — Encounter (HOSPITAL_BASED_OUTPATIENT_CLINIC_OR_DEPARTMENT_OTHER): Payer: Self-pay

## 2018-10-31 ENCOUNTER — Emergency Department (HOSPITAL_BASED_OUTPATIENT_CLINIC_OR_DEPARTMENT_OTHER): Payer: Medicare HMO

## 2018-10-31 ENCOUNTER — Encounter

## 2018-10-31 ENCOUNTER — Inpatient Hospital Stay (HOSPITAL_BASED_OUTPATIENT_CLINIC_OR_DEPARTMENT_OTHER)
Admission: EM | Admit: 2018-10-31 | Discharge: 2018-11-12 | DRG: 862 | Disposition: A | Discharge status: Home Health | Payer: Medicare HMO | Attending: Internal Medicine | Admitting: Internal Medicine

## 2018-10-31 ENCOUNTER — Other Ambulatory Visit: Payer: Self-pay

## 2018-10-31 DIAGNOSIS — I4510 Unspecified right bundle-branch block: Secondary | ICD-10-CM | POA: Diagnosis present

## 2018-10-31 DIAGNOSIS — D6489 Other specified anemias: Secondary | ICD-10-CM | POA: Diagnosis present

## 2018-10-31 DIAGNOSIS — R1011 Right upper quadrant pain: Secondary | ICD-10-CM | POA: Diagnosis present

## 2018-10-31 DIAGNOSIS — Z881 Allergy status to other antibiotic agents status: Secondary | ICD-10-CM

## 2018-10-31 DIAGNOSIS — I4891 Unspecified atrial fibrillation: Secondary | ICD-10-CM | POA: Diagnosis present

## 2018-10-31 DIAGNOSIS — K651 Peritoneal abscess: Secondary | ICD-10-CM

## 2018-10-31 DIAGNOSIS — I2510 Atherosclerotic heart disease of native coronary artery without angina pectoris: Secondary | ICD-10-CM | POA: Diagnosis present

## 2018-10-31 DIAGNOSIS — R627 Adult failure to thrive: Secondary | ICD-10-CM | POA: Diagnosis present

## 2018-10-31 DIAGNOSIS — Z79899 Other long term (current) drug therapy: Secondary | ICD-10-CM

## 2018-10-31 DIAGNOSIS — Z20828 Contact with and (suspected) exposure to other viral communicable diseases: Secondary | ICD-10-CM | POA: Diagnosis present

## 2018-10-31 DIAGNOSIS — Z9049 Acquired absence of other specified parts of digestive tract: Secondary | ICD-10-CM

## 2018-10-31 DIAGNOSIS — T8149XA Infection following a procedure, other surgical site, initial encounter: Secondary | ICD-10-CM | POA: Diagnosis not present

## 2018-10-31 DIAGNOSIS — Z87891 Personal history of nicotine dependence: Secondary | ICD-10-CM

## 2018-10-31 DIAGNOSIS — Z6832 Body mass index (BMI) 32.0-32.9, adult: Secondary | ICD-10-CM

## 2018-10-31 DIAGNOSIS — D62 Acute posthemorrhagic anemia: Secondary | ICD-10-CM | POA: Diagnosis not present

## 2018-10-31 DIAGNOSIS — K5939 Other megacolon: Secondary | ICD-10-CM | POA: Diagnosis present

## 2018-10-31 DIAGNOSIS — Z8719 Personal history of other diseases of the digestive system: Secondary | ICD-10-CM

## 2018-10-31 DIAGNOSIS — Z7982 Long term (current) use of aspirin: Secondary | ICD-10-CM

## 2018-10-31 DIAGNOSIS — K921 Melena: Secondary | ICD-10-CM | POA: Diagnosis not present

## 2018-10-31 DIAGNOSIS — Z7984 Long term (current) use of oral hypoglycemic drugs: Secondary | ICD-10-CM

## 2018-10-31 DIAGNOSIS — E669 Obesity, unspecified: Secondary | ICD-10-CM | POA: Diagnosis present

## 2018-10-31 DIAGNOSIS — Z7901 Long term (current) use of anticoagulants: Secondary | ICD-10-CM

## 2018-10-31 DIAGNOSIS — I4892 Unspecified atrial flutter: Secondary | ICD-10-CM | POA: Diagnosis present

## 2018-10-31 DIAGNOSIS — E872 Acidosis: Secondary | ICD-10-CM | POA: Diagnosis present

## 2018-10-31 DIAGNOSIS — N179 Acute kidney failure, unspecified: Secondary | ICD-10-CM | POA: Diagnosis present

## 2018-10-31 DIAGNOSIS — E860 Dehydration: Secondary | ICD-10-CM | POA: Diagnosis present

## 2018-10-31 DIAGNOSIS — A4181 Sepsis due to Enterococcus: Secondary | ICD-10-CM | POA: Diagnosis present

## 2018-10-31 DIAGNOSIS — A4159 Other Gram-negative sepsis: Secondary | ICD-10-CM | POA: Diagnosis present

## 2018-10-31 DIAGNOSIS — I10 Essential (primary) hypertension: Secondary | ICD-10-CM | POA: Diagnosis present

## 2018-10-31 DIAGNOSIS — T8143XA Infection following a procedure, organ and space surgical site, initial encounter: Secondary | ICD-10-CM | POA: Diagnosis present

## 2018-10-31 DIAGNOSIS — A419 Sepsis, unspecified organism: Secondary | ICD-10-CM

## 2018-10-31 DIAGNOSIS — Y836 Removal of other organ (partial) (total) as the cause of abnormal reaction of the patient, or of later complication, without mention of misadventure at the time of the procedure: Secondary | ICD-10-CM | POA: Diagnosis present

## 2018-10-31 DIAGNOSIS — E119 Type 2 diabetes mellitus without complications: Secondary | ICD-10-CM

## 2018-10-31 DIAGNOSIS — R140 Abdominal distension (gaseous): Secondary | ICD-10-CM

## 2018-10-31 DIAGNOSIS — K567 Ileus, unspecified: Secondary | ICD-10-CM | POA: Diagnosis not present

## 2018-10-31 DIAGNOSIS — Z951 Presence of aortocoronary bypass graft: Secondary | ICD-10-CM

## 2018-10-31 LAB — COMPREHENSIVE METABOLIC PANEL
ALT: 18 U/L (ref 0–44)
AST: 20 U/L (ref 15–41)
Albumin: 3.6 g/dL (ref 3.5–5.0)
Alkaline Phosphatase: 62 U/L (ref 38–126)
Anion gap: 16 — ABNORMAL HIGH (ref 5–15)
BUN: 21 mg/dL (ref 8–23)
CO2: 20 mmol/L — ABNORMAL LOW (ref 22–32)
Calcium: 9.5 mg/dL (ref 8.9–10.3)
Chloride: 97 mmol/L — ABNORMAL LOW (ref 98–111)
Creatinine, Ser: 1.53 mg/dL — ABNORMAL HIGH (ref 0.61–1.24)
GFR calc Af Amer: 50 mL/min — ABNORMAL LOW (ref >60)
GFR calc non Af Amer: 44 mL/min — ABNORMAL LOW (ref >60)
Glucose, Bld: 197 mg/dL — ABNORMAL HIGH (ref 70–99)
Potassium: 5.1 mmol/L (ref 3.5–5.1)
Sodium: 133 mmol/L — ABNORMAL LOW (ref 135–145)
Total Bilirubin: 0.6 mg/dL (ref 0.3–1.2)
Total Protein: 7.1 g/dL (ref 6.5–8.1)

## 2018-10-31 LAB — CBC WITH DIFFERENTIAL/PLATELET
Abs Immature Granulocytes: 0.21 K/uL — ABNORMAL HIGH (ref 0.00–0.07)
Basophils Absolute: 0.1 K/uL (ref 0.0–0.1)
Basophils Relative: 0 %
Eosinophils Absolute: 0 K/uL (ref 0.0–0.5)
Eosinophils Relative: 0 %
HCT: 32.5 % — ABNORMAL LOW (ref 39.0–52.0)
Hemoglobin: 10 g/dL — ABNORMAL LOW (ref 13.0–17.0)
Immature Granulocytes: 1 %
Lymphocytes Relative: 4 %
Lymphs Abs: 0.9 K/uL (ref 0.7–4.0)
MCH: 26 pg (ref 26.0–34.0)
MCHC: 30.8 g/dL (ref 30.0–36.0)
MCV: 84.6 fL (ref 80.0–100.0)
Monocytes Absolute: 2 K/uL — ABNORMAL HIGH (ref 0.1–1.0)
Monocytes Relative: 8 %
Neutro Abs: 21.5 K/uL — ABNORMAL HIGH (ref 1.7–7.7)
Neutrophils Relative %: 87 %
Platelets: 430 K/uL — ABNORMAL HIGH (ref 150–400)
RBC: 3.84 MIL/uL — ABNORMAL LOW (ref 4.22–5.81)
RDW: 16.4 % — ABNORMAL HIGH (ref 11.5–15.5)
WBC: 24.6 K/uL — ABNORMAL HIGH (ref 4.0–10.5)
nRBC: 0 % (ref 0.0–0.2)

## 2018-10-31 LAB — LACTIC ACID, PLASMA
Lactic Acid, Venous: 3.6 mmol/L (ref 0.5–1.9)
Lactic Acid, Venous: 2.8 mmol/L (ref 0.5–1.9)

## 2018-10-31 LAB — LIPASE, BLOOD: Lipase: 26 U/L (ref 11–51)

## 2018-10-31 LAB — SARS CORONAVIRUS 2 AG (30 MIN TAT): SARS Coronavirus 2 Ag: NEGATIVE

## 2018-10-31 MED ORDER — SODIUM CHLORIDE 0.9 % IV BOLUS
1000.0000 mL | Freq: Once | INTRAVENOUS | Status: AC
Start: 2018-10-31 — End: 2018-10-31
  Administered 2018-10-31: 1000 mL via INTRAVENOUS

## 2018-10-31 MED ORDER — SODIUM CHLORIDE 0.9 % IV SOLN
2.0000 g | Freq: Once | INTRAVENOUS | Status: AC
Start: 2018-10-31 — End: 2018-10-31
  Administered 2018-10-31: 2 g via INTRAVENOUS
  Filled 2018-10-31: qty 2

## 2018-10-31 MED ORDER — SODIUM CHLORIDE 0.9 % IV SOLN
1.0000 g | Freq: Three times a day (TID) | INTRAVENOUS | Status: DC
Start: 2018-11-01 — End: 2018-11-01
  Filled 2018-10-31 (×2): qty 1

## 2018-10-31 MED ORDER — IOHEXOL 300 MG/ML  SOLN
100.0000 mL | Freq: Once | INTRAMUSCULAR | Status: AC | PRN
Start: 2018-10-31 — End: 2018-10-31
  Administered 2018-10-31: 80 mL via INTRAVENOUS

## 2018-10-31 MED ORDER — CEFEPIME HCL 2 G IJ SOLR
INTRAMUSCULAR | Status: AC
Start: 2018-10-31 — End: 2018-11-01
  Filled 2018-10-31: qty 2

## 2018-10-31 MED ORDER — METRONIDAZOLE IN NACL 5-0.79 MG/ML-% IV SOLN
500.0000 mg | Freq: Once | INTRAVENOUS | Status: AC
Start: 2018-10-31 — End: 2018-10-31
  Administered 2018-10-31: 500 mg via INTRAVENOUS
  Filled 2018-10-31: qty 100

## 2018-10-31 MED ORDER — SODIUM CHLORIDE 0.9 % IV BOLUS
500.0000 mL | Freq: Once | INTRAVENOUS | Status: AC
Start: 2018-10-31 — End: 2018-11-01
  Administered 2018-10-31: 500 mL via INTRAVENOUS

## 2018-10-31 MED ORDER — ACETAMINOPHEN 500 MG PO TABS
1000.0000 mg | Freq: Once | ORAL | Status: AC
Start: 2018-10-31 — End: 2018-10-31
  Administered 2018-10-31: 1000 mg via ORAL
  Filled 2018-10-31: qty 2

## 2018-10-31 NOTE — ED Provider Notes (Signed)
[facility] EMERGENCY DEPARTMENT Provider Note   CSN: [id number] Arrival date & time: 10/31/18  1656    History   Chief Complaint Chief Complaint  Patient presents with   Abdominal Pain    HPI [name] is a 73 y.o. male with history of CAD, diabetes, hyperlipidemia, hypertension, diverticulitis, liver abscesses, gangrenous cholecystitis presents today for abdominal pain x1 week.  Patient underwent laparoscopic cholecystectomy on 10/07/2018 patient reports that since that time he has had increasing right-sided abdominal pain, fatigue, nausea and diarrhea.  Patient describes a tight feeling of the right side of his abdomen severe constant worsened with palpation and movement and without alleviating factors patient reports similar pain in the past when he was diagnosed with liver abscesses requiring drainage.  Patient reports he has been having subjective fever as well as chills however is not measured a temperature at home, no antipyretic use prior to arrival.    HPI  Past Medical History:  Diagnosis Date   Allergies    Anxiety    CAD (coronary artery disease)    Chest pain on exertion    Diabetes (HCC)    Dyspnea    upon exertion   Gout    Hepatitis    at 73 years old   Hyperlipemia    Hypertension     Patient Active Problem List   Diagnosis Date Noted   Abscess, intra-abdominal, postoperative 10/31/2018   Acute gangrenous cholecystitis 05/12/2018   Protein-calorie malnutrition, severe 05/10/2018   Liver abscess 05/09/2018   Acute diverticulitis 05/09/2018   Atrial flutter (HCC) 05/01/2018   Anemia 05/01/2018   Non-insulin dependent type 2 diabetes mellitus (HCC) 05/01/2018   Coronary artery disease 03/31/2018   Hx of CABG    Essential hypertension 02/25/2018   Hyperlipidemia 02/25/2018    Past Surgical History:  Procedure Laterality Date   CHOLECYSTECTOMY N/A 10/07/2018   Procedure: Laparoscopic Cholecystectomy;  Surgeon:  Almond LintByerly, Faera, MD;  Location: [facility] OR;  Service: General;  Laterality: N/A;   CORONARY ARTERY BYPASS GRAFT N/A 03/31/2018   Procedure: CORONARY ARTERY BYPASS GRAFTING (CABG) TIMES FOUR USING LEFT INTERNAL MAMMARY ARTERY AND RIGHT GREATER SAPHENOUS VEIN HARVESTED ENDOSCOPICALLY.;  Surgeon: Kerin PernaVan Trigt, Peter, MD;  [facility];  Service: Open Heart Surgery;  Laterality: N/A;   IR EXCHANGE BILIARY DRAIN  06/27/2018   IR PERC CHOLECYSTOSTOMY  05/11/2018   IR RADIOLOGIST EVAL & MGMT  06/11/2018   LEFT HEART CATH AND CORONARY ANGIOGRAPHY N/A 02/27/2018   Procedure: LEFT HEART CATH AND CORONARY ANGIOGRAPHY;  Surgeon: Runell GessBerry, Jonathan J, MD;  Location: [facility] INVASIVE CV LAB;  Service: Cardiovascular;  Laterality: N/A;   liver absess at 73 years old     TEE WITHOUT CARDIOVERSION N/A 03/31/2018   Procedure: TRANSESOPHAGEAL ECHOCARDIOGRAM (TEE);  Surgeon: Donata ClayVan Trigt, Theron AristaPeter, MD;  [facility];  Service: Open Heart Surgery;  Laterality: N/A;        Home Medications    Prior to Admission medications   Medication Sig Start Date End Date Taking? Authorizing Provider  acetaminophen (TYLENOL) 500 MG tablet Take 1,000 mg by mouth daily as needed for moderate pain.    [provider]  amiodarone (PACERONE) 200 MG tablet TAKE 1 TABLET BY MOUTH DAILY 07/04/18   Runell GessBerry, Jonathan J, MD  amLODipine (NORVASC) 5 MG tablet Take 5 mg by mouth daily. 02/05/18   [provider]  aspirin EC 81 MG tablet Take 1 tablet (81 mg total) by mouth daily. 05/01/18   Corine ShelterKilroy, Luke  K, PA-C  Cholecalciferol (VITAMIN D3) 2000 units TABS Take 2,000 Units by mouth 2 (two) times daily.     [provider]  docusate sodium (COLACE) 100 MG capsule Take 100 mg by mouth 2 (two) times daily.     [provider]  ELIQUIS 5 MG TABS tablet TAKE ONE TABLET BY MOUTH TWICE DAILY 06/03/18   Runell GessBerry, Jonathan J, MD  finasteride (PROSCAR) 5 MG tablet Take 5 mg by mouth daily. 02/05/18   [provider]  loratadine  (CLARITIN) 10 MG tablet Take 10 mg by mouth daily.    [provider]  metFORMIN (GLUCOPHAGE) 1000 MG tablet Take 1,000 mg by mouth 2 (two) times daily. 02/05/18   [provider]  metoprolol tartrate (LOPRESSOR) 25 MG tablet Take 1 tablet (25 mg total) by mouth 2 (two) times daily. 05/20/18   Runell GessBerry, Jonathan J, MD  Multiple Vitamins-Minerals (MULTIVITAMIN ADULT PO) Take 1 tablet by mouth daily.    [provider]  oxyCODONE (OXY IR/ROXICODONE) 5 MG immediate release tablet Take 1 tablet (5 mg total) by mouth every 6 (six) hours as needed for severe pain. 10/07/18   Almond LintByerly, Faera, MD    Family History Family History  Problem Relation Age of Onset   Atrial fibrillation Mother    Stroke Mother    Cancer Maternal Aunt     Social History Social History   Tobacco Use   Smoking status: Former Smoker    Types: Cigarettes    Quit date: 1982    Years since quitting: 38.5   Smokeless tobacco: Never Used   Tobacco comment: quit 1982  Substance Use Topics   Alcohol use: Not Currently   Drug use: Never     Allergies   Ciprofloxacin and Erythromycin ethylsuccinate   Review of Systems Review of Systems  Constitutional: Positive for chills, diaphoresis, fatigue and fever.  Respiratory: Negative for cough and shortness of breath.   Cardiovascular: Negative.  Negative for chest pain and leg swelling.  Gastrointestinal: Positive for abdominal pain, diarrhea and nausea. Negative for blood in stool and vomiting.  Genitourinary: Negative.  Negative for dysuria, hematuria, scrotal swelling and testicular pain.  All other systems reviewed and are negative.  Physical Exam Updated Vital Signs BP (!) 117/59    Pulse 91    Temp 98.9 F (37.2 C) (Oral)    Resp 20    Ht 5\' 11"  (1.803 m)    Wt 105.7 kg    SpO2 98%    BMI 32.50 kg/m   Physical Exam Constitutional:      General: He is not in acute distress.    Appearance: Normal appearance. He is well-developed. He  is obese. He is ill-appearing and diaphoretic. He is not toxic-appearing.  HENT:     Head: Normocephalic and atraumatic.     Right Ear: External ear normal.     Left Ear: External ear normal.     Nose: Nose normal.  Eyes:     General: Vision grossly intact. Gaze aligned appropriately.     Pupils: Pupils are equal, round, and reactive to light.  Neck:     Musculoskeletal: Normal range of motion.     Trachea: Trachea and phonation normal. No tracheal deviation.  Cardiovascular:     Rate and Rhythm: Normal rate and regular rhythm.     Heart sounds: Normal heart sounds.  Pulmonary:     Effort: Pulmonary effort is normal. No respiratory distress.     Breath sounds: Normal  breath sounds.  Abdominal:     General: There is no distension.     Palpations: Abdomen is soft.     Tenderness: There is abdominal tenderness in the right upper quadrant and right lower quadrant. There is guarding. There is no right CVA tenderness, left CVA tenderness or rebound.  Musculoskeletal: Normal range of motion.  Skin:    General: Skin is warm.  Neurological:     Mental Status: He is alert.     GCS: GCS eye subscore is 4. GCS verbal subscore is 5. GCS motor subscore is 6.     Comments: Speech is clear and goal oriented, follows commands Major Cranial nerves without deficit, no facial droop Moves extremities without ataxia, coordination intact  Psychiatric:        Behavior: Behavior normal.    ED Treatments / Results  Labs (all labs ordered are listed, but only abnormal results are displayed) Labs Reviewed  CBC WITH DIFFERENTIAL/PLATELET - Abnormal; Notable for the following components:      Result Value   WBC 24.6 (*)    RBC 3.84 (*)    Hemoglobin 10.0 (*)    HCT 32.5 (*)    RDW 16.4 (*)    Platelets 430 (*)    Neutro Abs 21.5 (*)    Monocytes Absolute 2.0 (*)    Abs Immature Granulocytes 0.21 (*)    All other components within normal limits  COMPREHENSIVE METABOLIC PANEL - Abnormal; Notable  for the following components:   Sodium 133 (*)    Chloride 97 (*)    CO2 20 (*)    Glucose, Bld 197 (*)    Creatinine, Ser 1.53 (*)    GFR calc non Af Amer 44 (*)    GFR calc Af Amer 50 (*)    Anion gap 16 (*)    All other components within normal limits  LACTIC ACID, PLASMA - Abnormal; Notable for the following components:   Lactic Acid, Venous 3.6 (*)    All other components within normal limits  LACTIC ACID, PLASMA - Abnormal; Notable for the following components:   Lactic Acid, Venous 2.8 (*)    All other components within normal limits  SARS CORONAVIRUS 2 (HOSP ORDER, PERFORMED IN  LAB VIA ABBOTT ID)  CULTURE, BLOOD (ROUTINE X 2)  LIPASE, BLOOD  URINALYSIS, ROUTINE W REFLEX MICROSCOPIC    EKG None  Radiology Ct Abdomen Pelvis W Contrast  Result Date: 10/31/2018 CLINICAL DATA:  Gallbladder surgery 3 weeks ago. History of liver abscess. Fever. EXAM: CT ABDOMEN AND PELVIS WITH CONTRAST TECHNIQUE: Multidetector CT imaging of the abdomen and pelvis was performed using the standard protocol following bolus administration of intravenous contrast. CONTRAST:  80mL OMNIPAQUE IOHEXOL 300 MG/ML  SOLN COMPARISON:  MRI of the abdomen July 01, 2018. CT scan of the abdomen and pelvis June 11, 2018. FINDINGS: Lower chest: No suspicious nodules, masses, or infiltrates in the lung bases. Lower chest is unremarkable. Hepatobiliary: Patient is status post cholecystectomy. There is a collection of debris, fluid, and gas centered in the gallbladder fossa. There is an oval region of high attenuation within the fossa, likely blood products from recent surgery. The fluid tracks superiorly along the anterior margin of the liver. Some low attenuation seen anterior to the liver on axial image 14 may represent some debris tracks superiorly. On image number 11, there is some high attenuation anterior to the liver which could represent a tiny amount of air tracking from the gallbladder fossa. The  collection abuts the first and second portions of the duodenum which is thickened and likely secondarily inflamed. The greatest dimension of the abnormality in the gallbladder fossa is 7 by 9.9 by 9.4 cm. The liver parenchyma itself is normal in appearance. The portal vein is patent. Pancreas: Unremarkable. No pancreatic ductal dilatation or surrounding inflammatory changes. Spleen: Normal in size without focal abnormality. 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 Adrenal glands are otherwise normal. There are at least 3 cysts in the left kidney. No hydronephrosis or stones. No evidence of obstruction. No ureteral stones. The bladder is normal. Stomach/Bowel: The stomach is normal. There is thickening of the proximal duodenum due to the abscess in the gallbladder fossa resulting in secondary inflammation. The abscess likely arises from the recent surgery. The small bowel is otherwise normal. Colonic diverticulosis is seen without diverticulitis. The appendix is normal. Vascular/Lymphatic: Atherosclerotic changes are seen in the nonaneurysmal aorta. No adenopathy. Reproductive: Prostate is unremarkable. Other: No abdominal wall hernia or abnormality. No abdominopelvic ascites. Musculoskeletal: No acute or significant osseous findings. IMPRESSION: 1. There is a 7 x 9.9 x 9.4 cm collection of fluid, debris, and gas centered in the gallbladder fossa with significant adjacent fat stranding. This finding is consistent with a postoperative abscess. The abscess abuts the proximal duodenum resulting in secondary wall thickening/inflammation. A small amount of debris and gas may extend anteriorly along the liver extending towards the dome. 2. Diverticulosis without diverticulitis. 3. Atherosclerotic changes in the nonaneurysmal aorta. Electronically Signed   By: Dorise Bullion III M.D   On: 10/31/2018 21:26    Procedures .Critical Care Performed by: Deliah Boston,  PA-C Authorized by: Deliah Boston, PA-C   Critical care provider statement:    Critical care time (minutes):  45   Critical care was necessary to treat or prevent imminent or life-threatening deterioration of the following conditions:  Sepsis   Critical care was time spent personally by me on the following activities:  Discussions with consultants, evaluation of patient's response to treatment, examination of patient, ordering and performing treatments and interventions, ordering and review of laboratory studies, ordering and review of radiographic studies, pulse oximetry, re-evaluation of patient's condition, obtaining history from patient or surrogate, review of old charts and development of treatment plan with patient or surrogate   (including critical care time)  Medications Ordered in ED Medications  ceFEPIme (MAXIPIME) 2 g injection (has no administration in time range)  ceFEPIme (MAXIPIME) 1 g in sodium chloride 0.9 % 100 mL IVPB (has no administration in time range)  sodium chloride 0.9 % bolus 1,000 mL (0 mLs Intravenous Stopped 10/31/18 2052)  ceFEPIme (MAXIPIME) 2 g in sodium chloride 0.9 % 100 mL IVPB (0 g Intravenous Stopped 10/31/18 2052)  metroNIDAZOLE (FLAGYL) IVPB 500 mg (0 mg Intravenous Stopped 10/31/18 2100)  acetaminophen (TYLENOL) tablet 1,000 mg (1,000 mg Oral Given 10/31/18 1946)  iohexol (OMNIPAQUE) 300 MG/ML solution 100 mL (80 mLs Intravenous Contrast Given 10/31/18 2035)    Initial Impression / Assessment and Plan / ED Course  I have reviewed the triage vital signs and the nursing notes.  Pertinent labs & imaging results that were available during my care of the patient were reviewed by me and considered in my medical decision making (see chart for details).  Triage lab work: CBC with leukocytosis of 24.6 with left shift CMP with creatinine 1.53 Lipase within normal limits - 7:15 PM: On initial evaluation patient is ill-appearing, diaphoretic with right-sided  abdominal pain.  No fever on arrival however will obtain rectal temperature to evaluate for fever.  Concern for possible sepsis from intra-abdominal infection at this time, blood work, fluids, antibiotics and CT abdomen pelvis ordered. - Fever 102.4 F rectally Tylenol given Cefepime given Flagyl given Fluid bolus given - Lactic 3.6 Cover 19 negative  CT abdomen pelvis:    IMPRESSION:  1. There is a 7 x 9.9 x 9.4 cm collection of fluid, debris, and gas  centered in the gallbladder fossa with significant adjacent fat  stranding. This finding is consistent with a postoperative abscess.  The abscess abuts the proximal duodenum resulting in secondary wall  thickening/inflammation. A small amount of debris and gas may extend  anteriorly along the liver extending towards the dome.  2. Diverticulosis without diverticulitis.  3. Atherosclerotic changes in the nonaneurysmal aorta.  - Discussed case with on-call general surgery Dr. Magnus IvanBlackman who [facility]. - Discussed case with hospitalist service who have accepted patient for admission.  Stepdown. - Patient reassessed resting comfortably no acute distress, vital signs have remained stable, patient states understanding of care plan and is agreeable for admission, no new complaints.  Patient seen and evaluated by Dr. Jodi MourningZavitz during this visit who agrees with care plan.  Note: Portions of this report may have been transcribed using voice recognition software. Every effort was made to ensure accuracy; however, inadvertent computerized transcription errors may still be present. Final Clinical Impressions(s) / ED Diagnoses   Final diagnoses:  Postoperative abscess  Sepsis, due to unspecified organism, unspecified whether acute organ dysfunction present [facility](HCC)    ED Discharge Orders    None       Elizabeth PalauMorelli, Kalani Baray A, PA-C 10/31/18 2253    Blane OharaZavitz, Joshua, MD 11/06/18 1743

## 2018-10-31 NOTE — ED Notes (Signed)
Received call; critical Lactic acid 2.8. Dr Reather Converse aware.

## 2018-10-31 NOTE — ED Provider Notes (Signed)
Epic error please see other note.   [name] 11/02/18 2137    Elnora Morrison, MD 11/06/18 [phone]

## 2018-10-31 NOTE — ED Triage Notes (Signed)
C/o abd pain, fatigue x 1 week-states he had recent GB removal and a liver abscess-to triage in w/c

## 2018-10-31 NOTE — ED Notes (Signed)
Called [facility] to advise that bed was ready

## 2018-10-31 NOTE — ED Notes (Signed)
Pt placed room 4  Awaiting provider

## 2018-10-31 NOTE — Progress Notes (Signed)
Pharmacy Antibiotic Note  [name] is a 73 y.o. male admitted on 10/31/2018 with intra-abdominal infection.  Pharmacy has been consulted for Cefepime dosing.  CrCl 50.8 ml/min - borderline for dose adjustment will monitor.  Plan: Cefepime 2 grams IV x 1, 1 gram IV q8hr Monitor renal function and C&S.  Height: 5\' 11"  (180.3 cm) Weight: 233 lb (105.7 kg) IBW/kg (Calculated) : 75.3  Temp (24hrs), Avg:100.9 F (38.3 C), Min:99.4 F (37.4 C), Max:102.4 F (39.1 C)  Recent Labs  Lab 10/31/18 1836  WBC 24.6*  CREATININE 1.53*    Estimated Creatinine Clearance: 50.8 mL/min (A) (by C-G formula based on SCr of 1.53 mg/dL (H)).    Allergies  Allergen Reactions  . Ciprofloxacin Rash  . Erythromycin Ethylsuccinate Rash    Antimicrobials this admission: Cefepime 7/3 >>  Thank you for allowing pharmacy to be a part of this patient's care.  Alanda Slim, PharmD, [facility] Clinical Pharmacist Please see AMION for all Pharmacists' Contact Phone Numbers 10/31/2018, 7:59 PM

## 2018-10-31 NOTE — ED Notes (Signed)
Critical lab received Lactic acid 3.6; Dr Reather Converse aware.

## 2018-11-01 ENCOUNTER — Encounter

## 2018-11-01 ENCOUNTER — Inpatient Hospital Stay (HOSPITAL_COMMUNITY): Payer: Medicare HMO

## 2018-11-01 DIAGNOSIS — T8149XA Infection following a procedure, other surgical site, initial encounter: Secondary | ICD-10-CM

## 2018-11-01 DIAGNOSIS — N179 Acute kidney failure, unspecified: Secondary | ICD-10-CM

## 2018-11-01 DIAGNOSIS — I4510 Unspecified right bundle-branch block: Secondary | ICD-10-CM

## 2018-11-01 LAB — URINALYSIS, ROUTINE W REFLEX MICROSCOPIC
Bilirubin Urine: NEGATIVE
Glucose, UA: NEGATIVE mg/dL
Hgb urine dipstick: NEGATIVE
Ketones, ur: 5 mg/dL — AB
Leukocytes,Ua: NEGATIVE
Nitrite: NEGATIVE
Protein, ur: NEGATIVE mg/dL
Specific Gravity, Urine: 1.043 — ABNORMAL HIGH (ref 1.005–1.030)
pH: 5 (ref 5.0–8.0)

## 2018-11-01 LAB — GLUCOSE, CAPILLARY
Glucose-Capillary: 140 mg/dL — ABNORMAL HIGH (ref 70–99)
Glucose-Capillary: 123 mg/dL — ABNORMAL HIGH (ref 70–99)
Glucose-Capillary: 115 mg/dL — ABNORMAL HIGH (ref 70–99)
Glucose-Capillary: 123 mg/dL — ABNORMAL HIGH (ref 70–99)
Glucose-Capillary: 145 mg/dL — ABNORMAL HIGH (ref 70–99)

## 2018-11-01 LAB — CBC
HCT: 25.8 % — ABNORMAL LOW (ref 39.0–52.0)
Hemoglobin: 8.1 g/dL — ABNORMAL LOW (ref 13.0–17.0)
MCH: 26.3 pg (ref 26.0–34.0)
MCHC: 31.4 g/dL (ref 30.0–36.0)
MCV: 83.8 fL (ref 80.0–100.0)
Platelets: 262 K/uL (ref 150–400)
RBC: 3.08 MIL/uL — ABNORMAL LOW (ref 4.22–5.81)
RDW: 16.4 % — ABNORMAL HIGH (ref 11.5–15.5)
WBC: 15.2 K/uL — ABNORMAL HIGH (ref 4.0–10.5)
nRBC: 0 % (ref 0.0–0.2)

## 2018-11-01 LAB — LACTIC ACID, PLASMA: Lactic Acid, Venous: 1.5 mmol/L (ref 0.5–1.9)

## 2018-11-01 LAB — HEMOGLOBIN A1C
Hgb A1c MFr Bld: 6 % — ABNORMAL HIGH (ref 4.8–5.6)
Mean Plasma Glucose: 125.5 mg/dL

## 2018-11-01 LAB — TROPONIN I (HIGH SENSITIVITY): Troponin I (High Sensitivity): 12 ng/L (ref <18)

## 2018-11-01 MED ORDER — MIDAZOLAM HCL 2 MG/2ML IJ SOLN
INTRAMUSCULAR | Status: AC | PRN
Start: 2018-11-01 — End: 2018-11-01
  Administered 2018-11-01 (×2): 1 mg via INTRAVENOUS

## 2018-11-01 MED ORDER — INSULIN ASPART 100 UNIT/ML ~~LOC~~ SOLN
0.0000 [IU] | SUBCUTANEOUS | Status: DC
Start: 2018-11-01 — End: 2018-11-01
  Administered 2018-11-01 (×2): 1 [IU] via SUBCUTANEOUS

## 2018-11-01 MED ORDER — INSULIN ASPART 100 UNIT/ML ~~LOC~~ SOLN
0.0000 [IU] | Freq: Three times a day (TID) | SUBCUTANEOUS | Status: DC
Start: 2018-11-02 — End: 2018-11-12
  Administered 2018-11-02 (×2): 1 [IU] via SUBCUTANEOUS
  Administered 2018-11-03 – 2018-11-04 (×5): 2 [IU] via SUBCUTANEOUS
  Administered 2018-11-05: 1 [IU] via SUBCUTANEOUS
  Administered 2018-11-05 (×2): 2 [IU] via SUBCUTANEOUS
  Administered 2018-11-06 (×2): 1 [IU] via SUBCUTANEOUS
  Administered 2018-11-06: 2 [IU] via SUBCUTANEOUS
  Administered 2018-11-07 – 2018-11-12 (×6): 1 [IU] via SUBCUTANEOUS

## 2018-11-01 MED ORDER — FENTANYL CITRATE (PF) 100 MCG/2ML IJ SOLN
INTRAMUSCULAR | Status: AC | PRN
Start: 2018-11-01 — End: 2018-11-01
  Administered 2018-11-01 (×2): 50 ug via INTRAVENOUS

## 2018-11-01 MED ORDER — ACETAMINOPHEN 650 MG RE SUPP
650.0000 mg | Freq: Four times a day (QID) | RECTAL | Status: DC | PRN
Start: 2018-11-01 — End: 2018-11-12

## 2018-11-01 MED ORDER — HYDROCODONE-ACETAMINOPHEN 5-325 MG PO TABS
1.0000 | Freq: Four times a day (QID) | ORAL | Status: DC | PRN
Start: 2018-11-01 — End: 2018-11-12
  Administered 2018-11-01 – 2018-11-08 (×18): 1 via ORAL
  Filled 2018-11-01 (×18): qty 1

## 2018-11-01 MED ORDER — SODIUM CHLORIDE 0.9 % IV BOLUS
500.0000 mL | Freq: Once | INTRAVENOUS | Status: AC
Start: 2018-11-01 — End: 2018-11-01
  Administered 2018-11-01: 500 mL via INTRAVENOUS

## 2018-11-01 MED ORDER — HYDROMORPHONE HCL 1 MG/ML IJ SOLN
1.0000 mg | INTRAMUSCULAR | Status: DC | PRN
Start: 2018-11-01 — End: 2018-11-12
  Administered 2018-11-01 (×2): 1 mg via INTRAVENOUS
  Filled 2018-11-01 (×2): qty 1

## 2018-11-01 MED ORDER — PIPERACILLIN-TAZOBACTAM 3.375 G IVPB
3.3750 g | Freq: Three times a day (TID) | INTRAVENOUS | Status: DC
Start: 2018-11-01 — End: 2018-11-06
  Administered 2018-11-01 – 2018-11-06 (×16): 3.375 g via INTRAVENOUS
  Filled 2018-11-01 (×13): qty 50

## 2018-11-01 MED ORDER — SODIUM CHLORIDE 0.9% FLUSH
5.0000 mL | Freq: Three times a day (TID) | INTRAVENOUS | Status: DC
Start: 2018-11-01 — End: 2018-11-12
  Administered 2018-11-01 – 2018-11-12 (×30): 5 mL

## 2018-11-01 MED ORDER — FENTANYL CITRATE (PF) 100 MCG/2ML IJ SOLN
INTRAMUSCULAR | Status: AC
Start: 2018-11-01 — End: 2018-11-01
  Filled 2018-11-01: qty 4

## 2018-11-01 MED ORDER — MIDAZOLAM HCL 2 MG/2ML IJ SOLN
INTRAMUSCULAR | Status: AC
Start: 2018-11-01 — End: 2018-11-01
  Filled 2018-11-01: qty 4

## 2018-11-01 MED ORDER — SODIUM CHLORIDE 0.9 % IV SOLN
INTRAVENOUS | Status: AC | PRN
Start: 2018-11-01 — End: 2018-11-01
  Administered 2018-11-01: 10 mL/h via INTRAVENOUS

## 2018-11-01 MED ORDER — ACETAMINOPHEN 325 MG PO TABS
650.0000 mg | Freq: Four times a day (QID) | ORAL | Status: DC | PRN
Start: 2018-11-01 — End: 2018-11-12
  Administered 2018-11-01 – 2018-11-10 (×3): 650 mg via ORAL
  Filled 2018-11-01 (×3): qty 2

## 2018-11-01 MED ORDER — LIDOCAINE HCL 1 % IJ SOLN
INTRAMUSCULAR | Status: AC
Start: 2018-11-01 — End: 2018-11-01
  Filled 2018-11-01: qty 20

## 2018-11-01 MED ORDER — OXYCODONE HCL ER 10 MG PO T12A
10.0000 mg | Freq: Two times a day (BID) | ORAL | Status: DC | PRN
Start: 2018-11-01 — End: 2018-11-12
  Administered 2018-11-01 – 2018-11-06 (×8): 10 mg via ORAL
  Filled 2018-11-01 (×8): qty 1

## 2018-11-01 MED ORDER — AMIODARONE HCL 200 MG PO TABS
200.0000 mg | Freq: Every day | ORAL | Status: DC
Start: 2018-11-01 — End: 2018-11-01

## 2018-11-01 MED ORDER — MORPHINE SULFATE (PF) 2 MG/ML IV SOLN
1.0000 mg | INTRAVENOUS | Status: DC | PRN
Start: 2018-11-01 — End: 2018-11-01
  Filled 2018-11-01: qty 1

## 2018-11-01 MED ORDER — SODIUM CHLORIDE 0.9 % IV SOLN
INTRAVENOUS | Status: AC
Start: 2018-11-01 — End: 2018-11-01
  Administered 2018-11-01 (×2): via INTRAVENOUS

## 2018-11-01 NOTE — Procedures (Signed)
GB fossa fossa  S/p CT DRAIN  No comp Stable Aspirated c/w infected hematoma, may not drain well cx sent Suction bulb Full report in pacs

## 2018-11-01 NOTE — Consult Note (Signed)
Reason for Consult:abscess Referring Physician: [name] is an 73 y.o. male.  HPI:  Pt is a 73 yo M who is transferred from [facility] ED for weakness and failure to thrive post lap chole.  He was found to be febrile and to have intraabdominal fluid collection in the gallbladder fossa.  He was met by the surgery service in Jan 2020 following a CABG when he presented with abdominal pain.  He was seen to have liver abscess and question of perforated gallbladder.  He received perc chole tube 05/11/2018.  He had his drain flushed and then exchanged in February.  There was a question of possible gallbladder wall mass on original imaging, and MRI was performed to evaluate.  This did not show a mass.  He was originally scheduled for lap chole in later march or April, but required rescheduling due to [place].    He eventually underwent lap chole 10/07/18.  Surgery was difficult, as is typical for lap chole after perc chole tube, but structures were seen well.  He felt well enough to go home the day of surgery, but just felt really run down and this sense got progressively worse.  He denied any significant pain, and was unaware of fevers, but just felt like he had to sleep all the time.  He eventually felt that something was wrong and sought care in the ED.    Imaging showed RUQ fluid collection c/w abscess.  He was febrile upon presentation to the ED.    Past Medical History:  Diagnosis Date  . Allergies   . Anxiety   . CAD (coronary artery disease)   . Chest pain on exertion   . Diabetes ([place])   . Dyspnea    upon exertion  . Gout   . Hepatitis    at 73 years old  . Hyperlipemia   . Hypertension     Past Surgical History:  Procedure Laterality Date  . CHOLECYSTECTOMY N/A 10/07/2018   Procedure: Laparoscopic Cholecystectomy;  Surgeon: [name], MD;  Location: [place];  Service: General;  Laterality: N/A;  . CORONARY ARTERY BYPASS GRAFT N/A 03/31/2018   Procedure: CORONARY ARTERY BYPASS  GRAFTING (CABG) TIMES FOUR USING LEFT INTERNAL MAMMARY ARTERY AND RIGHT GREATER SAPHENOUS VEIN HARVESTED ENDOSCOPICALLY.;  Surgeon: Ivin Poot, MD;  Location: [place];  Service: Open Heart Surgery;  Laterality: N/A;  . IR EXCHANGE BILIARY DRAIN  06/27/2018  . IR PERC CHOLECYSTOSTOMY  05/11/2018  . IR RADIOLOGIST EVAL & MGMT  06/11/2018  . LEFT HEART CATH AND CORONARY ANGIOGRAPHY N/A 02/27/2018   Procedure: LEFT HEART CATH AND CORONARY ANGIOGRAPHY;  Surgeon: Lorretta Harp, MD;  Location: [place] [facility];  Service: Cardiovascular;  Laterality: N/A;  . liver absess at 73 years old    . TEE WITHOUT CARDIOVERSION N/A 03/31/2018   Procedure: TRANSESOPHAGEAL ECHOCARDIOGRAM (TEE);  Surgeon: Prescott Gum, Collier Salina, MD;  Location: [place];  Service: Open Heart Surgery;  Laterality: N/A;    Family History  Problem Relation Age of Onset  . Atrial fibrillation Mother   . Stroke Mother   . Cancer Maternal Aunt     Social History:  reports that he quit smoking about 38 years ago. His smoking use included cigarettes. He has never used smokeless tobacco. He reports previous alcohol use. He reports that he does not use drugs.  Allergies:  Allergies  Allergen Reactions  . Ciprofloxacin Rash  . Erythromycin Ethylsuccinate Rash    Medications:  Prior  to Admission:  Medications Prior to Admission  Medication Sig Dispense Refill Last Dose  . acetaminophen (TYLENOL) 500 MG tablet Take 1,000 mg by mouth daily as needed for moderate pain.     Marland Kitchen amiodarone (PACERONE) 200 MG tablet TAKE 1 TABLET BY MOUTH DAILY 30 tablet 10   . amLODipine (NORVASC) 5 MG tablet Take 5 mg by mouth daily.     Marland Kitchen aspirin EC 81 MG tablet Take 1 tablet (81 mg total) by mouth daily. 90 tablet 3   . Cholecalciferol (VITAMIN D3) 2000 units TABS Take 2,000 Units by mouth 2 (two) times daily.      Marland Kitchen docusate sodium (COLACE) 100 MG capsule Take 100 mg by mouth 2 (two) times daily.      Marland Kitchen ELIQUIS 5 MG TABS tablet TAKE ONE TABLET BY MOUTH  TWICE DAILY 60 tablet 6   . finasteride (PROSCAR) 5 MG tablet Take 5 mg by mouth daily.     Marland Kitchen loratadine (CLARITIN) 10 MG tablet Take 10 mg by mouth daily.     . metFORMIN (GLUCOPHAGE) 1000 MG tablet Take 1,000 mg by mouth 2 (two) times daily.     . metoprolol tartrate (LOPRESSOR) 25 MG tablet Take 1 tablet (25 mg total) by mouth 2 (two) times daily. 180 tablet 3   . Multiple Vitamins-Minerals (MULTIVITAMIN ADULT PO) Take 1 tablet by mouth daily.     Marland Kitchen oxyCODONE (OXY IR/ROXICODONE) 5 MG immediate release tablet Take 1 tablet (5 mg total) by mouth every 6 (six) hours as needed for severe pain. 20 tablet 0     Results for orders placed or performed during the hospital encounter of 10/31/18 (from the past 48 hour(s))  CBC with Differential     Status: Abnormal   Collection Time: 10/31/18  6:36 PM  Result Value Ref Range   WBC 24.6 (H) 4.0 - 10.5 K/uL   RBC 3.84 (L) 4.22 - 5.81 MIL/uL   Hemoglobin 10.0 (L) 13.0 - 17.0 g/dL   HCT 32.5 (L) 39.0 - 52.0 %   MCV 84.6 80.0 - 100.0 fL   MCH 26.0 26.0 - 34.0 pg   MCHC 30.8 30.0 - 36.0 g/dL   RDW 16.4 (H) 11.5 - 15.5 %   Platelets 430 (H) 150 - 400 K/uL   nRBC 0.0 0.0 - 0.2 %   Neutrophils Relative % 87 %   Neutro Abs 21.5 (H) 1.7 - 7.7 K/uL   Lymphocytes Relative 4 %   Lymphs Abs 0.9 0.7 - 4.0 K/uL   Monocytes Relative 8 %   Monocytes Absolute 2.0 (H) 0.1 - 1.0 K/uL   Eosinophils Relative 0 %   Eosinophils Absolute 0.0 0.0 - 0.5 K/uL   Basophils Relative 0 %   Basophils Absolute 0.1 0.0 - 0.1 K/uL   Immature Granulocytes 1 %   Abs Immature Granulocytes 0.21 (H) 0.00 - 0.07 K/uL    Comment: Performed at [facility], [address] [place], [place] [address]  Comprehensive metabolic panel     Status: Abnormal   Collection Time: 10/31/18  6:36 PM  Result Value Ref Range   Sodium 133 (L) 135 - 145 mmol/L   Potassium 5.1 3.5 - 5.1 mmol/L   Chloride 97 (L) 98 - 111 mmol/L   CO2 20 (L) 22 - 32 mmol/L   Glucose, Bld 197 (H) 70 -  99 mg/dL   BUN 21 8 - 23 mg/dL   Creatinine, Ser 1.53 (H) 0.61 - 1.24 mg/dL  Calcium 9.5 8.9 - 10.3 mg/dL   Total Protein 7.1 6.5 - 8.1 g/dL   Albumin 3.6 3.5 - 5.0 g/dL   AST 20 15 - 41 U/L   ALT 18 0 - 44 U/L   Alkaline Phosphatase 62 38 - 126 U/L   Total Bilirubin 0.6 0.3 - 1.2 mg/dL   GFR calc non Af Amer 44 (L) >60 mL/min   GFR calc Af Amer 50 (L) >60 mL/min   Anion gap 16 (H) 5 - 15    Comment: Performed at [facility], [address] [place], [place] [address]  Lipase, blood     Status: None   Collection Time: 10/31/18  6:36 PM  Result Value Ref Range   Lipase 26 11 - 51 U/L    Comment: Performed at [facility], [address] [place], [place] [address]  Lactic acid, plasma     Status: Abnormal   Collection Time: 10/31/18  7:39 PM  Result Value Ref Range   Lactic Acid, Venous 3.6 (HH) 0.5 - 1.9 mmol/L    Comment: CRITICAL RESULT CALLED TO, READ BACK BY AND VERIFIED WITH: Jaci Carrel RN '@2021'$  10/31/2018 OLSONM Performed at [facility], [address] [place], [place] [address]   SARS Coronavirus 2 ([place] order,Performed in [place] lab via [employer] ID)     Status: None   Collection Time: 10/31/18  8:52 PM   Specimen: Dry Nasal Swab ([facility] ID Now)  Result Value Ref Range   SARS Coronavirus 2 ([facility] ID Now) NEGATIVE NEGATIVE    Comment: (NOTE) SARS-CoV-2 target nucleic acids are NOT DETECTED. The SARS-CoV-2 RNA is generally detectable in upper and lower respiratory specimens during the acute phase of infection.  Negativeresults do not preclude SARS-CoV-2 infection, do not rule out coinfections with other pathogens, and should not be used as the  sole basis for treatment or other patient management decisions.  Negative results must be combined with clinical observations, patient history, and epidemiological information. The expected result is Negative. Fact Sheet for Patients: GolfingFamily.no Fact  Sheet for Healthcare Providers: https://www.hernandez-brewer.com/ This test is not yet approved or cleared by the [place] FDA and  has been authorized for detection and/or diagnosis of SARS-CoV-2 by FDA under an Emergency Use Authorization (EUA).  This EUA will remain in effect (meaning this test can be used) for the duration of  the COVID19 declaration under Section 5 64(b)(1) of the Act, 21 U.S.C.  section [phone] 3(b)(1), unless the authorization is terminated or revoked sooner. Performed at [facility], [address] [place], [place] [address]   Lactic acid, plasma     Status: Abnormal   Collection Time: 10/31/18  9:46 PM  Result Value Ref Range   Lactic Acid, Venous 2.8 (HH) 0.5 - 1.9 mmol/L    Comment: CRITICAL RESULT CALLED TO, READ BACK BY AND VERIFIED WITH: WALTON,M AT 2202 ON [id number] BY CHERESNOWSKY,T Performed at [facility], [address] [place], [place] [address]   Urinalysis, Routine w reflex microscopic     Status: Abnormal   Collection Time: 11/01/18  2:28 AM  Result Value Ref Range   Color, Urine YELLOW YELLOW   APPearance CLEAR CLEAR   Specific Gravity, Urine 1.043 (H) 1.005 - 1.030   pH 5.0 5.0 - 8.0   Glucose, UA NEGATIVE NEGATIVE mg/dL   Hgb urine dipstick NEGATIVE NEGATIVE   Bilirubin Urine NEGATIVE NEGATIVE  Ketones, ur 5 (A) NEGATIVE mg/dL   Protein, ur NEGATIVE NEGATIVE mg/dL   Nitrite NEGATIVE NEGATIVE   Leukocytes,Ua NEGATIVE NEGATIVE    Comment: Performed at [facility] [address], [place], [place] [address]  Glucose, capillary     Status: Abnormal   Collection Time: 11/01/18  2:36 AM  Result Value Ref Range   Glucose-Capillary 140 (H) 70 - 99 mg/dL  CBC     Status: Abnormal   Collection Time: 11/01/18  3:15 AM  Result Value Ref Range   WBC 15.2 (H) 4.0 - 10.5 K/uL   RBC 3.08 (L) 4.22 - 5.81 MIL/uL   Hemoglobin 8.1 (L) 13.0 - 17.0 g/dL   HCT 25.8 (L) 39.0 - 52.0 %   MCV 83.8 80.0 - 100.0  fL   MCH 26.3 26.0 - 34.0 pg   MCHC 31.4 30.0 - 36.0 g/dL   RDW 16.4 (H) 11.5 - 15.5 %   Platelets 262 150 - 400 K/uL   nRBC 0.0 0.0 - 0.2 %    Comment: Performed at [facility], [place] [address] [place], [place] [address]  Hemoglobin A1c     Status: Abnormal   Collection Time: 11/01/18  3:15 AM  Result Value Ref Range   Hgb A1c MFr Bld 6.0 (H) 4.8 - 5.6 %    Comment: (NOTE) Pre diabetes:          5.7%-6.4% Diabetes:              >6.4% Glycemic control for   <7.0% adults with diabetes    Mean Plasma Glucose 125.5 mg/dL    Comment: Performed at [facility] [address] [place], [place] [address]  Troponin I (High Sensitivity)     Status: None   Collection Time: 11/01/18  3:15 AM  Result Value Ref Range   Troponin I (High Sensitivity) 12 <18 ng/L    Comment: (NOTE) Elevated high sensitivity troponin I (hsTnI) values and significant  changes across serial measurements may suggest ACS but many other  chronic and acute conditions are known to elevate hsTnI results.  Refer to the "Links" section for chest pain algorithms and additional  guidance. Performed at [facility], [place] [address], [place], [place] [address]     Ct Abdomen Pelvis W Contrast  Result Date: 10/31/2018 CLINICAL DATA:  Gallbladder surgery 3 weeks ago. History of liver abscess. Fever. EXAM: CT ABDOMEN AND PELVIS WITH CONTRAST TECHNIQUE: Multidetector CT imaging of the abdomen and pelvis was performed using the standard protocol following bolus administration of intravenous contrast. CONTRAST:  45m OMNIPAQUE IOHEXOL 300 MG/ML  SOLN COMPARISON:  MRI of the abdomen July 01, 2018. CT scan of the abdomen and pelvis June 11, 2018. FINDINGS: Lower chest: No suspicious nodules, masses, or infiltrates in the lung bases. Lower chest is unremarkable. Hepatobiliary: Patient is status post cholecystectomy. There is a collection of debris, fluid, and gas centered in the gallbladder fossa. There is an oval  region of high attenuation within the fossa, likely blood products from recent surgery. The fluid tracks superiorly along the anterior margin of the liver. Some low attenuation seen anterior to the liver on axial image 14 may represent some debris tracks superiorly. On image number 11, there is some high attenuation anterior to the liver which could represent a tiny amount of air tracking from the gallbladder fossa. The collection abuts the first and second portions of the duodenum which is thickened and likely secondarily  inflamed. The greatest dimension of the abnormality in the gallbladder fossa is 7 by 9.9 by 9.4 cm. The liver parenchyma itself is normal in appearance. The portal vein is patent. Pancreas: Unremarkable. No pancreatic ductal dilatation or surrounding inflammatory changes. Spleen: Normal in size without focal abnormality. 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 Adrenal glands are otherwise normal. There are at least 3 cysts in the left kidney. No hydronephrosis or stones. No evidence of obstruction. No ureteral stones. The bladder is normal. Stomach/Bowel: The stomach is normal. There is thickening of the proximal duodenum due to the abscess in the gallbladder fossa resulting in secondary inflammation. The abscess likely arises from the recent surgery. The small bowel is otherwise normal. Colonic diverticulosis is seen without diverticulitis. The appendix is normal. Vascular/Lymphatic: Atherosclerotic changes are seen in the nonaneurysmal aorta. No adenopathy. Reproductive: Prostate is unremarkable. Other: No abdominal wall hernia or abnormality. No abdominopelvic ascites. Musculoskeletal: No acute or significant osseous findings. IMPRESSION: 1. There is a 7 x 9.9 x 9.4 cm collection of fluid, debris, and gas centered in the gallbladder fossa with significant adjacent fat stranding. This finding is consistent with a postoperative abscess. The abscess abuts the  proximal duodenum resulting in secondary wall thickening/inflammation. A small amount of debris and gas may extend anteriorly along the liver extending towards the dome. 2. Diverticulosis without diverticulitis. 3. Atherosclerotic changes in the nonaneurysmal aorta. Electronically Signed   By: Dorise Bullion III M.D   On: 10/31/2018 21:26    Review of Systems  Constitutional: Positive for fever and malaise/fatigue.  HENT: Negative.   Eyes: Negative.   Respiratory: Negative.   Cardiovascular: Negative.   Gastrointestinal: Positive for nausea.  Genitourinary: Negative.   Musculoskeletal: Negative.   Skin: Negative.   Neurological: Negative.   Endo/Heme/Allergies: Negative.   Psychiatric/Behavioral: Negative.   All other systems reviewed and are negative.  Blood pressure (!) 144/68, pulse 99, temperature 99.5 F (37.5 C), temperature source Oral, resp. rate (!) 21, height '5\' 11"'$  (1.803 m), weight 106.3 kg, SpO2 100 %. Physical Exam  Constitutional: He is oriented to person, place, and time. He appears well-developed and well-nourished. No distress.  HENT:  Head: Normocephalic and atraumatic.  Right Ear: External ear normal.  Left Ear: External ear normal.  Eyes: Pupils are equal, round, and reactive to light. Conjunctivae are normal. No scleral icterus.  Neck: Normal range of motion. Neck supple. No thyromegaly present.  Cardiovascular: Normal rate, regular rhythm and intact distal pulses.  Respiratory: Effort normal. No respiratory distress. He exhibits no tenderness.  GI: Soft. He exhibits no distension. There is no abdominal tenderness. There is no rebound and no guarding.  Neurological: He is alert and oriented to person, place, and time.  Skin: Skin is warm and dry. No rash noted. He is not diaphoretic. No erythema. No pallor.  Psychiatric: He has a normal mood and affect. His behavior is normal. Judgment and thought content normal.    Assessment/Plan: Abscess after lap  chole IR consult IV antibiotics Perc drain/culture  Pt received eliquis 7/3. Hold for now until perc drain.   Will see if they want to wait another day or do today.  If output is bilious, will likely need ercp/stenting.    [name] 11/01/2018, 5:47 AM

## 2018-11-01 NOTE — H&P (Addendum)
History and Physical    [name] FAO:[id number] DOB: 1942-07-09 DOA: 10/31/2018  PCP: Karle PlumberArvind, Moogali M, MD Patient coming from: [facility]  Chief Complaint: Abdominal pain  HPI: [name] is a 73 y.o. male with medical history significant of CAD status post CABG x4, type 2 diabetes, hypertension, hyperlipidemia, diverticulitis, liver abscess, cholecystitis status post percutaneous cholecystostomy tube 1/12 and eventually lap chole on 6/9 presented [facility] with complaints of abdominal pain.  Patient states he has not been doing well since his recent surgery and has continued to have abdominal pain which is getting progressively worse.  The pain is located in his right mid abdomen.  He feels nauseous but has not vomited.  He is having diarrhea.  He has not been eating much.  Denies any fevers, chills, chest pain, shortness of breath, or cough.  Denies exposure to any individual with known COVID-19.  ED Course: Temperature 102.4 F.  Hemodynamically stable.  White count 24.6 with left shift.  Lactic acid 3.6 >2.8 with IV fluid. Hemoglobin 10.0, baseline ranging from 8-11.  Creatinine 1.5, baseline 1.1-1.3.  Lipase and LFTs normal.  UA not suggestive of infection.  CT abdomen pelvis showing a 7 x 9.9 x 9.4 cm collection of fluid, debris, and gas centered in the gallbladder fossa with significant adjacent fat stranding.  Findings consistent with a postoperative abscess.  The abscess abuts the proximal duodenum resulting in secondary wall thickening/inflammation.  A small amount of debris and gas may extend anteriorly along the liver extending towards the dome. ED provider discussed the case with general surgery Dr. Magnus IvanBlackman who recommended admission under hospitalist service.  Patient received cefepime, metronidazole, and 1.5 L fluid boluses in the ED.  Review of Systems:  All systems reviewed and apart from history of presenting illness, are negative.  Past  Medical History:  Diagnosis Date   Allergies    Anxiety    CAD (coronary artery disease)    Chest pain on exertion    Diabetes (HCC)    Dyspnea    upon exertion   Gout    Hepatitis    at 73 years old   Hyperlipemia    Hypertension     Past Surgical History:  Procedure Laterality Date   CHOLECYSTECTOMY N/A 10/07/2018   Procedure: Laparoscopic Cholecystectomy;  Surgeon: Almond LintByerly, Faera, MD;  Location: [facility] OR;  Service: General;  Laterality: N/A;   CORONARY ARTERY BYPASS GRAFT N/A 03/31/2018   Procedure: CORONARY ARTERY BYPASS GRAFTING (CABG) TIMES FOUR USING LEFT INTERNAL MAMMARY ARTERY AND RIGHT GREATER SAPHENOUS VEIN HARVESTED ENDOSCOPICALLY.;  Surgeon: Kerin PernaVan Trigt, Peter, MD;  [facility];  Service: Open Heart Surgery;  Laterality: N/A;   IR EXCHANGE BILIARY DRAIN  06/27/2018   IR PERC CHOLECYSTOSTOMY  05/11/2018   IR RADIOLOGIST EVAL & MGMT  06/11/2018   LEFT HEART CATH AND CORONARY ANGIOGRAPHY N/A 02/27/2018   Procedure: LEFT HEART CATH AND CORONARY ANGIOGRAPHY;  Surgeon: Runell GessBerry, Jonathan J, MD;  Location: [facility] INVASIVE CV LAB;  Service: Cardiovascular;  Laterality: N/A;   liver absess at 73 years old     TEE WITHOUT CARDIOVERSION N/A 03/31/2018   Procedure: TRANSESOPHAGEAL ECHOCARDIOGRAM (TEE);  Surgeon: Donata ClayVan Trigt, Theron AristaPeter, MD;  [facility];  Service: Open Heart Surgery;  Laterality: N/A;     reports that he quit smoking about 38 years ago. His smoking use included cigarettes. He has never used smokeless tobacco. He reports previous alcohol use. He reports  that he does not use drugs.  Allergies  Allergen Reactions   Ciprofloxacin Rash   Erythromycin Ethylsuccinate Rash    Family History  Problem Relation Age of Onset   Atrial fibrillation Mother    Stroke Mother    Cancer Maternal Aunt     Prior to Admission medications   Medication Sig Start Date End Date Taking? Authorizing Provider  acetaminophen (TYLENOL) 500 MG tablet Take 1,000 mg by mouth daily  as needed for moderate pain.    [provider]  amiodarone (PACERONE) 200 MG tablet TAKE 1 TABLET BY MOUTH DAILY 07/04/18   Runell GessBerry, Jonathan J, MD  amLODipine (NORVASC) 5 MG tablet Take 5 mg by mouth daily. 02/05/18   [provider]  aspirin EC 81 MG tablet Take 1 tablet (81 mg total) by mouth daily. 05/01/18   Abelino DerrickKilroy, Luke K, PA-C  Cholecalciferol (VITAMIN D3) 2000 units TABS Take 2,000 Units by mouth 2 (two) times daily.     [provider]  docusate sodium (COLACE) 100 MG capsule Take 100 mg by mouth 2 (two) times daily.     [provider]  ELIQUIS 5 MG TABS tablet TAKE ONE TABLET BY MOUTH TWICE DAILY 06/03/18   Runell GessBerry, Jonathan J, MD  finasteride (PROSCAR) 5 MG tablet Take 5 mg by mouth daily. 02/05/18   [provider]  loratadine (CLARITIN) 10 MG tablet Take 10 mg by mouth daily.    [provider]  metFORMIN (GLUCOPHAGE) 1000 MG tablet Take 1,000 mg by mouth 2 (two) times daily. 02/05/18   [provider]  metoprolol tartrate (LOPRESSOR) 25 MG tablet Take 1 tablet (25 mg total) by mouth 2 (two) times daily. 05/20/18   Runell GessBerry, Jonathan J, MD  Multiple Vitamins-Minerals (MULTIVITAMIN ADULT PO) Take 1 tablet by mouth daily.    [provider]  oxyCODONE (OXY IR/ROXICODONE) 5 MG immediate release tablet Take 1 tablet (5 mg total) by mouth every 6 (six) hours as needed for severe pain. 10/07/18   Almond LintByerly, Faera, MD    Physical Exam: Vitals:   11/01/18 0030 11/01/18 0045 11/01/18 0057 11/01/18 0140  BP: (!) 99/52 115/62  (!) 144/68  Pulse: 88 89  99  Resp: 20 (!) 21  (!) 21  Temp:   98.3 F (36.8 C) 99.5 F (37.5 C)  TempSrc:   Oral Oral  SpO2: 97% 99%  100%  Weight:    106.3 kg  Height:    5\' 11"  (1.803 m)    Physical Exam  Constitutional: He is oriented to person, place, and time. He appears well-developed and well-nourished.  HENT:  Head: Normocephalic.  Dry mucous membranes  Eyes: Right eye exhibits no discharge. Left  eye exhibits no discharge.  Neck: Neck supple.  Cardiovascular: Normal rate, regular rhythm and intact distal pulses.  Pulmonary/Chest: Effort normal and breath sounds normal. No respiratory distress. He has no wheezes. He has no rales.  Abdominal: Soft. Bowel sounds are normal. He exhibits no distension. There is abdominal tenderness. There is no rebound and no guarding.  Right upper and lower quadrants tender to palpation  Musculoskeletal:        General: No edema.  Neurological: He is alert and oriented to person, place, and time.  Skin: Skin is warm and dry. He is not diaphoretic.     Labs on Admission: I have personally reviewed following labs and imaging studies  CBC: Recent Labs  Lab 10/31/18 1836 11/01/18 0315  WBC 24.6* 15.2*  NEUTROABS 21.5*  --  HGB 10.0* 8.1*  HCT 32.5* 25.8*  MCV 84.6 83.8  PLT 430* 979   Basic Metabolic Panel: Recent Labs  Lab 10/31/18 1836  NA 133*  K 5.1  CL 97*  CO2 20*  GLUCOSE 197*  BUN 21  CREATININE 1.53*  CALCIUM 9.5   GFR: Estimated Creatinine Clearance: 51 mL/min (A) (by C-G formula based on SCr of 1.53 mg/dL (H)). Liver Function Tests: Recent Labs  Lab 10/31/18 1836  AST 20  ALT 18  ALKPHOS 62  BILITOT 0.6  PROT 7.1  ALBUMIN 3.6   Recent Labs  Lab 10/31/18 1836  LIPASE 26   No results for input(s): AMMONIA in the last 168 hours. Coagulation Profile: No results for input(s): INR, PROTIME in the last 168 hours. Cardiac Enzymes: No results for input(s): CKTOTAL, CKMB, CKMBINDEX, TROPONINI in the last 168 hours. BNP (last 3 results) No results for input(s): PROBNP in the last 8760 hours. HbA1C: Recent Labs    11/01/18 0315  HGBA1C 6.0*   CBG: Recent Labs  Lab 11/01/18 0236  GLUCAP 140*   Lipid Profile: No results for input(s): CHOL, HDL, LDLCALC, TRIG, CHOLHDL, LDLDIRECT in the last 72 hours. Thyroid Function Tests: No results for input(s): TSH, T4TOTAL, FREET4, T3FREE, THYROIDAB in the last 72  hours. Anemia Panel: No results for input(s): VITAMINB12, FOLATE, FERRITIN, TIBC, IRON, RETICCTPCT in the last 72 hours. Urine analysis:    Component Value Date/Time   COLORURINE YELLOW 11/01/2018 0228   APPEARANCEUR CLEAR 11/01/2018 0228   LABSPEC 1.043 (H) 11/01/2018 0228   PHURINE 5.0 11/01/2018 0228   GLUCOSEU NEGATIVE 11/01/2018 0228   HGBUR NEGATIVE 11/01/2018 0228   BILIRUBINUR NEGATIVE 11/01/2018 0228   KETONESUR 5 (A) 11/01/2018 0228   PROTEINUR NEGATIVE 11/01/2018 0228   NITRITE NEGATIVE 11/01/2018 0228   LEUKOCYTESUR NEGATIVE 11/01/2018 0228    Radiological Exams on Admission: Ct Abdomen Pelvis W Contrast  Result Date: 10/31/2018 CLINICAL DATA:  Gallbladder surgery 3 weeks ago. History of liver abscess. Fever. EXAM: CT ABDOMEN AND PELVIS WITH CONTRAST TECHNIQUE: Multidetector CT imaging of the abdomen and pelvis was performed using the standard protocol following bolus administration of intravenous contrast. CONTRAST:  41mL OMNIPAQUE IOHEXOL 300 MG/ML  SOLN COMPARISON:  MRI of the abdomen July 01, 2018. CT scan of the abdomen and pelvis June 11, 2018. FINDINGS: Lower chest: No suspicious nodules, masses, or infiltrates in the lung bases. Lower chest is unremarkable. Hepatobiliary: Patient is status post cholecystectomy. There is a collection of debris, fluid, and gas centered in the gallbladder fossa. There is an oval region of high attenuation within the fossa, likely blood products from recent surgery. The fluid tracks superiorly along the anterior margin of the liver. Some low attenuation seen anterior to the liver on axial image 14 may represent some debris tracks superiorly. On image number 11, there is some high attenuation anterior to the liver which could represent a tiny amount of air tracking from the gallbladder fossa. The collection abuts the first and second portions of the duodenum which is thickened and likely secondarily inflamed. The greatest dimension of the  abnormality in the gallbladder fossa is 7 by 9.9 by 9.4 cm. The liver parenchyma itself is normal in appearance. The portal vein is patent. Pancreas: Unremarkable. No pancreatic ductal dilatation or surrounding inflammatory changes. Spleen: Normal in size without focal abnormality. 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 Adrenal glands are otherwise normal. There are at least 3 cysts in the left  kidney. No hydronephrosis or stones. No evidence of obstruction. No ureteral stones. The bladder is normal. Stomach/Bowel: The stomach is normal. There is thickening of the proximal duodenum due to the abscess in the gallbladder fossa resulting in secondary inflammation. The abscess likely arises from the recent surgery. The small bowel is otherwise normal. Colonic diverticulosis is seen without diverticulitis. The appendix is normal. Vascular/Lymphatic: Atherosclerotic changes are seen in the nonaneurysmal aorta. No adenopathy. Reproductive: Prostate is unremarkable. Other: No abdominal wall hernia or abnormality. No abdominopelvic ascites. Musculoskeletal: No acute or significant osseous findings. IMPRESSION: 1. There is a 7 x 9.9 x 9.4 cm collection of fluid, debris, and gas centered in the gallbladder fossa with significant adjacent fat stranding. This finding is consistent with a postoperative abscess. The abscess abuts the proximal duodenum resulting in secondary wall thickening/inflammation. A small amount of debris and gas may extend anteriorly along the liver extending towards the dome. 2. Diverticulosis without diverticulitis. 3. Atherosclerotic changes in the nonaneurysmal aorta. Electronically Signed   By: Gerome Samavid  Williams III M.D   On: 10/31/2018 21:26    EKG: Independently reviewed.  Sinus rhythm.  Right bundle branch block new since prior tracing from March 2020.  Assessment/Plan Principal Problem:   Abscess, intra-abdominal, postoperative Active Problems:   Hx of  CABG   Coronary artery disease   Non-insulin dependent type 2 diabetes mellitus (HCC)   AKI (acute kidney injury) (HCC)   RBBB  Sepsis secondary to postoperative abscess/ infection Temperature 102.4 F.  Hemodynamically stable.  White count 24.6 with left shift.  Lactic acid 3.6 >2.8 with IV fluid. CT abdomen pelvis showing a 7 x 9.9 x 9.4 cm collection of fluid, debris, and gas centered in the gallbladder fossa with significant adjacent fat stranding.  Findings consistent with a postoperative abscess.  The abscess abuts the proximal duodenum resulting in secondary wall thickening/inflammation.  A small amount of debris and gas may extend anteriorly along the liver extending towards the dome. -Dr. Donell BeersByerly from general surgery paged, awaiting callback -Monitor closely in the stepdown unit -IV fluid -Zosyn -IV morphine PRN pain -Tylenol PRN -Continue to monitor white count -Keep n.p.o.  Addendum:  -Spoke to Dr. Donell BeersByerly, she will see the patient tonight.  -Patient has a history of atrial flutter post bypass surgery currently on Eliquis and amiodarone.  His last dose of Eliquis was 7/3 am. HOLD eliquis.  Currently in sinus rhythm.  Discussed with pharmacy, amiodarone level should be therapeutic for the next few days.  Medication can be resumed when patient is no longer n.p.o.  AKI Creatinine 1.5, baseline 1.1-1.3.   Likely prerenal secondary to dehydration. -IV fluid hydration -Avoid nephrotoxic agents -Monitor urine output -Continue to monitor renal function  CAD status post CABG x4, new RBBB EKG showing right bundle branch block which appears new compared to prior tracing from March 2020.  No anginal symptoms at present and hemodynamically stable. -Cardiac monitoring -Check troponin level  Well-controlled type 2 diabetes A1c 6.0. -Sliding scale insulin sensitive and CBG checks  DVT prophylaxis: SCDs in anticipation of procedure/surgery. Code Status: Patient wishes to be full  code. Family Communication: No family available at this time. Disposition Plan: Anticipate discharge after clinical improvement. Consults called: General surgery (Dr. Donell BeersByerly) Admission status: It is my clinical opinion that admission to INPATIENT is reasonable and necessary in this 73 y.o. male  presenting with sepsis secondary to postoperative infection/abscess.  Patient will need broad-spectrum antibiotics and surgical evaluation.   Given the aforementioned, the predictability  of an adverse outcome is felt to be significant. I expect that the patient will require at least 2 midnights in the hospital to treat this condition.   The medical decision making on this patient was of high complexity and the patient is at high risk for clinical deterioration, therefore this is a level 3 visit.  John Giovanni MD [facility] Pager [phone]  If 7PM-7AM, please contact night-coverage [email or web address] Password [email or web address]  11/01/2018, 3:59 AM

## 2018-11-01 NOTE — Consult Note (Signed)
Chief Complaint: Patient was seen in consultation today for gall bladder fossa abscess  Referring Physician(s): Dr. Barry Dienes  Supervising Physician: Daryll Brod  Patient Status: [name] - In-pt  History of Present Illness: [name] is a 73 y.o. male with past medical history of CAD, CM, HTN, on Eliquis following a CABG in December 2019. He originally presented with acute cholecystitis in January 2020 and underwent a percutaneous cholecystostomy tube placement 05/11/18 following by lap cholecystectomy 10/07/18.  He returns to [facility] ED today with RLQ abdominal pain.   CT Abdomen Pelvis 7/3: 1. There is a 7 x 9.9 x 9.4 cm collection of fluid, debris, and gas centered in the gallbladder fossa with significant adjacent fat stranding. This finding is consistent with a postoperative abscess. The abscess abuts the proximal duodenum resulting in secondary wall thickening/inflammation. A small amount of debris and gas may extend anteriorly along the liver extending towards the dome. 2. Diverticulosis without diverticulitis. 3. Atherosclerotic changes in the nonaneurysmal aorta.  IR consulted for aspiration and drainage at hte request of Dr. Barry Dienes. Patient has been NPO.  He last took Eliquis yesterday morning.   Past Medical History:  Diagnosis Date   Allergies    Anxiety    CAD (coronary artery disease)    Chest pain on exertion    Diabetes ([place])    Dyspnea    upon exertion   Gout    Hepatitis    at 73 years old   Hyperlipemia    Hypertension     Past Surgical History:  Procedure Laterality Date   CHOLECYSTECTOMY N/A 10/07/2018   Procedure: Laparoscopic Cholecystectomy;  Surgeon: [name], MD;  Location: [place];  Service: General;  Laterality: N/A;   CORONARY ARTERY BYPASS GRAFT N/A 03/31/2018   Procedure: CORONARY ARTERY BYPASS GRAFTING (CABG) TIMES FOUR USING LEFT INTERNAL MAMMARY ARTERY AND RIGHT GREATER SAPHENOUS VEIN HARVESTED ENDOSCOPICALLY.;  Surgeon: Ivin Poot, MD;  Location: [place];  Service: Open Heart Surgery;  Laterality: N/A;   IR EXCHANGE BILIARY DRAIN  06/27/2018   IR PERC CHOLECYSTOSTOMY  05/11/2018   IR RADIOLOGIST EVAL & MGMT  06/11/2018   LEFT HEART CATH AND CORONARY ANGIOGRAPHY N/A 02/27/2018   Procedure: LEFT HEART CATH AND CORONARY ANGIOGRAPHY;  Surgeon: Lorretta Harp, MD;  Location: [place] [facility];  Service: Cardiovascular;  Laterality: N/A;   liver absess at 73 years old     TEE WITHOUT CARDIOVERSION N/A 03/31/2018   Procedure: TRANSESOPHAGEAL ECHOCARDIOGRAM (TEE);  Surgeon: Prescott Gum, Collier Salina, MD;  Location: [place];  Service: Open Heart Surgery;  Laterality: N/A;    Allergies: Ciprofloxacin and Erythromycin ethylsuccinate  Medications: Prior to Admission medications   Medication Sig Start Date End Date Taking? Authorizing Provider  acetaminophen (TYLENOL) 500 MG tablet Take 1,000 mg by mouth daily as needed for moderate pain.   Yes [provider]  amiodarone (PACERONE) 200 MG tablet TAKE 1 TABLET BY MOUTH DAILY Patient taking differently: Take 200 mg by mouth daily.  07/04/18  Yes Lorretta Harp, MD  amLODipine (NORVASC) 5 MG tablet Take 5 mg by mouth daily. 02/05/18  Yes [provider]  aspirin EC 81 MG tablet Take 1 tablet (81 mg total) by mouth daily. Patient taking differently: Take 81 mg by mouth every evening.  05/01/18  Yes Kilroy, Doreene Burke, PA-C  Cholecalciferol (VITAMIN D3) 2000 units TABS Take 2,000 Units by mouth 2 (two) times daily.    Yes [provider]  docusate sodium (  COLACE) 100 MG capsule Take 100 mg by mouth 2 (two) times daily.    Yes [provider]  ELIQUIS 5 MG TABS tablet TAKE ONE TABLET BY MOUTH TWICE DAILY 06/03/18  Yes Runell GessBerry, Jonathan J, MD  finasteride (PROSCAR) 5 MG tablet Take 5 mg by mouth daily. 02/05/18  Yes [provider]  loratadine (CLARITIN) 10 MG tablet Take 10 mg by mouth daily.   Yes [provider]  metFORMIN  (GLUCOPHAGE) 1000 MG tablet Take 1,000 mg by mouth 2 (two) times daily. 02/05/18  Yes [provider]  metoprolol tartrate (LOPRESSOR) 25 MG tablet Take 1 tablet (25 mg total) by mouth 2 (two) times daily. 05/20/18  Yes Runell GessBerry, Jonathan J, MD  Multiple Vitamins-Minerals (MULTIVITAMIN ADULT PO) Take 1 tablet by mouth daily.   Yes [provider]  oxyCODONE (OXY IR/ROXICODONE) 5 MG immediate release tablet Take 1 tablet (5 mg total) by mouth every 6 (six) hours as needed for severe pain. Patient not taking: Reported on 11/01/2018 10/07/18   Almond LintByerly, Faera, MD     Family History  Problem Relation Age of Onset   Atrial fibrillation Mother    Stroke Mother    Cancer Maternal Aunt     Social History   Socioeconomic History   Marital status: Widowed    Spouse name: Not on file   Number of children: 2   Years of education: Not on file   Highest education level: Not on file  Occupational History   Occupation: retired  Ecologistocial Needs   Financial resource strain: Not on file   Food insecurity    Worry: Not on file    Inability: Not on Occupational hygienistfile   Transportation needs    Medical: Not on file    Non-medical: Not on file  Tobacco Use   Smoking status: Former Smoker    Types: Cigarettes    Quit date: 1982    Years since quitting: 38.5   Smokeless tobacco: Never Used   Tobacco comment: quit 1982  Substance and Sexual Activity   Alcohol use: Not Currently   Drug use: Never   Sexual activity: Not on file  Lifestyle   Physical activity    Days per week: Not on file    Minutes per session: Not on file   Stress: Not on file  Relationships   Social connections    Talks on phone: Not on file    Gets together: Not on file    Attends religious service: Not on file    Active member of club or organization: Not on file    Attends meetings of clubs or organizations: Not on file    Relationship status: Not on file  Other Topics Concern   Not on file  Social  History Narrative   [place]/ Retired Insurance underwritercomputer tech.     Review of Systems: A 12 point ROS discussed and pertinent positives are indicated in the HPI above.  All other systems are negative.  Review of Systems  Constitutional: Negative for fatigue and fever.  Respiratory: Negative for cough and shortness of breath.   Cardiovascular: Negative for chest pain.  Gastrointestinal: Positive for abdominal pain. Negative for nausea and vomiting.  Psychiatric/Behavioral: Negative for behavioral problems and confusion.    Vital Signs: BP (!) 145/67 (BP Location: Right Arm)    Pulse 96    Temp 98.4 F (36.9 C) (Oral)    Resp 16    Ht 5\' 11"  (1.803 m)  Wt 234 lb 4.8 oz (106.3 kg)    SpO2 98%    BMI 32.68 kg/m   Physical Exam Vitals signs and nursing note reviewed.  Constitutional:      Appearance: He is well-developed.  HENT:     Mouth/Throat:     Mouth: Mucous membranes are moist.     Pharynx: Oropharynx is clear.  Cardiovascular:     Rate and Rhythm: Normal rate and regular rhythm.  Pulmonary:     Effort: Pulmonary effort is normal. No respiratory distress.     Breath sounds: Normal breath sounds.  Abdominal:     General: Abdomen is flat. There is no distension.     Palpations: Abdomen is soft.     Tenderness: There is abdominal tenderness in the right lower quadrant. There is no guarding.  Skin:    General: Skin is warm and dry.  Neurological:     General: No focal deficit present.     Mental Status: He is alert and oriented to person, place, and time.  Psychiatric:        Mood and Affect: Mood normal.        Behavior: Behavior normal.      MD Evaluation Airway: WNL Heart: WNL Abdomen: WNL Chest/ Lungs: WNL ASA  Classification: 3 Mallampati/Airway Score: Three   Imaging: Ct Abdomen Pelvis W Contrast  Result Date: 10/31/2018 CLINICAL DATA:  Gallbladder surgery 3 weeks ago. History of liver abscess. Fever. EXAM: CT ABDOMEN AND PELVIS WITH CONTRAST  TECHNIQUE: Multidetector CT imaging of the abdomen and pelvis was performed using the standard protocol following bolus administration of intravenous contrast. CONTRAST:  80mL OMNIPAQUE IOHEXOL 300 MG/ML  SOLN COMPARISON:  MRI of the abdomen July 01, 2018. CT scan of the abdomen and pelvis June 11, 2018. FINDINGS: Lower chest: No suspicious nodules, masses, or infiltrates in the lung bases. Lower chest is unremarkable. Hepatobiliary: Patient is status post cholecystectomy. There is a collection of debris, fluid, and gas centered in the gallbladder fossa. There is an oval region of high attenuation within the fossa, likely blood products from recent surgery. The fluid tracks superiorly along the anterior margin of the liver. Some low attenuation seen anterior to the liver on axial image 14 may represent some debris tracks superiorly. On image number 11, there is some high attenuation anterior to the liver which could represent a tiny amount of air tracking from the gallbladder fossa. The collection abuts the first and second portions of the duodenum which is thickened and likely secondarily inflamed. The greatest dimension of the abnormality in the gallbladder fossa is 7 by 9.9 by 9.4 cm. The liver parenchyma itself is normal in appearance. The portal vein is patent. Pancreas: Unremarkable. No pancreatic ductal dilatation or surrounding inflammatory changes. Spleen: Normal in size without focal abnormality. 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 Adrenal glands are otherwise normal. There are at least 3 cysts in the left kidney. No hydronephrosis or stones. No evidence of obstruction. No ureteral stones. The bladder is normal. Stomach/Bowel: The stomach is normal. There is thickening of the proximal duodenum due to the abscess in the gallbladder fossa resulting in secondary inflammation. The abscess likely arises from the recent surgery. The small bowel is otherwise normal.  Colonic diverticulosis is seen without diverticulitis. The appendix is normal. Vascular/Lymphatic: Atherosclerotic changes are seen in the nonaneurysmal aorta. No adenopathy. Reproductive: Prostate is unremarkable. Other: No abdominal wall hernia or abnormality. No abdominopelvic ascites. Musculoskeletal: No acute  or significant osseous findings. IMPRESSION: 1. There is a 7 x 9.9 x 9.4 cm collection of fluid, debris, and gas centered in the gallbladder fossa with significant adjacent fat stranding. This finding is consistent with a postoperative abscess. The abscess abuts the proximal duodenum resulting in secondary wall thickening/inflammation. A small amount of debris and gas may extend anteriorly along the liver extending towards the dome. 2. Diverticulosis without diverticulitis. 3. Atherosclerotic changes in the nonaneurysmal aorta. Electronically Signed   By: Gerome Samavid  Williams III M.D   On: 10/31/2018 21:26    Labs:  CBC: Recent Labs    05/15/18 0415 10/06/18 0903 10/31/18 1836 11/01/18 0315  WBC 6.0 8.4 24.6* 15.2*  HGB 7.9* 11.7* 10.0* 8.1*  HCT 26.4* 37.2* 32.5* 25.8*  PLT 394 257 430* 262    COAGS: Recent Labs    03/14/18 1610 03/26/18 1146 03/31/18 1458 04/08/18 0238 05/10/18 0907 10/06/18 0903  INR 0.96 0.96 1.37 1.06 1.42 0.9  APTT 32 31 29  --   --   --     BMP: Recent Labs    05/14/18 0238 05/15/18 0415 10/06/18 0903 10/31/18 1836  NA 132* 135 136 133*  K 3.7 3.7 4.7 5.1  CL 98 102 101 97*  CO2 22 24 21* 20*  GLUCOSE 128* 128* 122* 197*  BUN 15 12 18 21   CALCIUM 8.8* 8.9 9.5 9.5  CREATININE 1.43* 1.21 1.31* 1.53*  GFRNONAA 47* 58* 53* 44*  GFRAA 55* >60 >60 50*    LIVER FUNCTION TESTS: Recent Labs    05/14/18 0238 05/15/18 0415 10/06/18 0903 10/31/18 1836  BILITOT 0.5 0.5 0.7 0.6  AST 30 25 32 20  ALT 36 30 25 18   ALKPHOS 155* 130* 51 62  PROT 5.7* 5.3* 6.6 7.1  ALBUMIN 2.1* 2.1* 3.7 3.6    TUMOR MARKERS: No results for input(s): AFPTM,  CEA, CA199, CHROMGRNA in the last 8760 hours.  Assessment and Plan: Intra-abdominal fluid collection, suspected gall bladder fossa abscess vs. Bile leak Patient with RLQ pain.  His WBC on admission was 24.6, it has decreased to 15.2 today.  Tmax 102.4. Lactic acid 3.6. on admission.  Tachycardic. HR 106 In step-down Patient did take his regular dose of Eliquis yesterday morning.   Discussed case and vital signs with Dr. Miles CostainShick.  Plan to move forward today with placement of gall bladder fossa drain.  Discussed risks with patient.   Risks and benefits discussed with the patient including, but not limited to bleeding, infection, bile leak, sepsis or even death.  All of the patient's questions were answered, patient is agreeable to proceed. Consent signed and in chart.  Thank you for this interesting consult.  I greatly enjoyed meeting [name] and look forward to participating in their care.  A copy of this report was sent to the requesting provider on this date.  Electronically Signed: Hoyt KochKacie Sue-Ellen Kielyn Kardell, PA 11/01/2018, 9:47 AM   I spent a total of 40 Minutes    in face to face in clinical consultation, greater than 50% of which was counseling/coordinating care for intra-abdominal fluid collection.

## 2018-11-01 NOTE — Plan of Care (Signed)

## 2018-11-01 NOTE — Progress Notes (Addendum)
PROGRESS NOTE    Calvin FantasiaLarry F Cassidy  NFA:[id number]:[id number] DOB: 1942/05/30 DOA: 10/31/2018 PCP: Karle PlumberArvind, Moogali M, MD   Brief Narrative:  [name] is a 73 y.o. male with medical history significant of CAD status post CABG x4, type 2 diabetes, hypertension, hyperlipidemia, diverticulitis, liver abscess, cholecystitis status post percutaneous cholecystostomy tube 1/12 and eventually lap chole on 6/9 presented to [facility] [place] [facility] ED with complaints of abdominal pain.  Patient states he has not been doing well since his recent surgery and has continued to have abdominal pain which is getting progressively worse.  The pain is located in his right mid abdomen.  He feels nauseous but has not vomited.  He is having diarrhea.  He has not been eating much.  Denies any fevers, chills, chest pain, shortness of breath, or cough.  Denies exposure to any individual with known COVID-19. In ED: Temperature 102.4 F. Hemodynamically stable. White count 24.6 with left shift. Lactic acid 3.6 >2.8 with IV fluid. Hemoglobin 10.0, baseline ranging from 8-11.  Creatinine 1.5, baseline 1.1-1.3.  Lipase and LFTs normal.  UA not suggestive of infection.  CT abdomen pelvis showing a 7 x 9.9 x 9.4 cm collection of fluid, debris, and gas centered in the gallbladder fossa with significant adjacent fat stranding.  Findings consistent with a postoperative abscess. The abscess abuts the proximal duodenum resulting in secondary wall thickening/inflammation. A small amount of debris and gas may extend anteriorly along the liver extending towards the dome. ED provider discussed the case with general surgery Dr. Magnus IvanBlackman who recommended admission under hospitalist service. Patient received cefepime, metronidazole, and 1.5 L fluid boluses in the ED.  Assessment & Plan:   Principal Problem:   Abscess, intra-abdominal, postoperative Active Problems:   Hx of CABG   Coronary artery disease   Non-insulin dependent type 2 diabetes mellitus  (HCC)   AKI (acute kidney injury) (HCC)   RBBB   Sepsis secondary to postoperative complicated abscess POA, ongoing -Febrile(102), leukocytosis(25), imaging remarkable for 7 x 10 x 9 cm fluid collection with adjacent fat stranding at admission -Patient did not require fluid challenge or pressors at admission -Now status post drain via IR, appreciate insight recommendations, continue drainage to bulb; somewhat purulent in appearance per report -cultures pending -Continue to hold patient's home Eliquis (last dose 7/3), resume once approved by IR/surgery given possible need for further procedure or intervention -Concurrent lactic acidosis, improving, no longer following -Continue supportive care, pain management, antibiotics with Zosyn -initiated 10/31/2018- de-escalate as able/per cultures -N.p.o., likely advance to sips, clears, meds unless otherwise specified by surgery  Anion gap metabolic acidosis in the setting of lactic acidosis, POA, resolving -Continue IV fluids, antibiotics, supportive care as above  Acute hemodilutional anemia on chronic anemia, likely of chronic disease, stable -Baseline appears to be around 10 previously - currently 8 after IV fluids -No signs/symptoms of bleeding - monitor closely given on Eliquis at home  CAD status post CABG x4, new RBBB  -EKG showing right bundle branch block which appears new compared to prior tracing earlier this year/last year - previously showing fib/flutter morphology.   -No anginal symptoms at present and hemodynamically stable. -Continue cardiac monitoring -Troponin unremarkable at admission  Well-controlled type 2 diabetes -A1c 6.0. -Continue sliding scale insulin sensitive, hypoglycemic protocol, and CBG checks  DVT prophylaxis: SCDs in anticipation of procedure/surgery; previously on Eliquis at home Code Status: Full. Family Communication: No family available at this time. Disposition Plan: Remains inpatient: Anticipate  discharge after clinical  improvement, pending further surgical/procedural needs, fully suspect the patient will need percutaneous drainage, IV antibiotics for an additional 48-72 hours prior to any further procedures.  Consults called: General surgery (Dr. Barry Dienes)  Procedures:   Percutaneous drainage with IR 11/01/2018  Antimicrobials:  Zosyn initiated 10/31/2018 - ongoing Metronidazole/cefepime x 1 dose: 10/31/18  Subjective: No acute issues or events overnight, patient admits to moderate ongoing right upper quadrant abdominal pain, declines chest pain, nausea, vomiting, diarrhea, constipation, headache, fever, chills.  Objective: Vitals:   11/01/18 1120 11/01/18 1125 11/01/18 1129 11/01/18 1213  BP: (!) 112/58 123/60  135/65  Pulse: 90 89 89 92  Resp:    (!) 21  Temp:    98.1 F (36.7 C)  TempSrc:    Oral  SpO2: 90% 99% 100% 97%  Weight:      Height:        Intake/Output Summary (Last 24 hours) at 11/01/2018 1250 Last data filed at 11/01/2018 1221 Gross per 24 hour  Intake 2384.28 ml  Output 850 ml  Net 1534.28 ml   Filed Weights   10/31/18 1720 11/01/18 0140  Weight: 105.7 kg 106.3 kg    Examination: General:  Pleasantly resting in bed, No acute distress. HEENT:  Normocephalic atraumatic.  Sclerae nonicteric, noninjected. Extraocular movements intact bilaterally. Neck:  Without mass or deformity.  Trachea is midline. Lungs:  Clear to auscultate bilaterally without rhonchi, wheeze, or rales. Heart:  Regular rate and rhythm. Without murmurs, rubs, or gallops. Abdomen:  Soft, right upper quadrant moderately tender, nondistended.  Without guarding or rebound. Extremities: Without cyanosis, clubbing, edema, or obvious deformity. Vascular:  Dorsalis pedis and posterior tibial pulses palpable bilaterally. Skin:  Warm and dry, no erythema, no ulcerations.  Data Reviewed: I have personally reviewed following labs and imaging studies  CBC: Recent Labs  Lab 10/31/18 1836  11/01/18 0315  WBC 24.6* 15.2*  NEUTROABS 21.5*  --   HGB 10.0* 8.1*  HCT 32.5* 25.8*  MCV 84.6 83.8  PLT 430* 893   Basic Metabolic Panel: Recent Labs  Lab 10/31/18 1836  NA 133*  K 5.1  CL 97*  CO2 20*  GLUCOSE 197*  BUN 21  CREATININE 1.53*  CALCIUM 9.5   GFR: Estimated Creatinine Clearance: 51 mL/min (A) (by C-G formula based on SCr of 1.53 mg/dL (H)). Liver Function Tests: Recent Labs  Lab 10/31/18 1836  AST 20  ALT 18  ALKPHOS 62  BILITOT 0.6  PROT 7.1  ALBUMIN 3.6   Recent Labs  Lab 10/31/18 1836  LIPASE 26   No results for input(s): AMMONIA in the last 168 hours. Coagulation Profile: No results for input(s): INR, PROTIME in the last 168 hours. Cardiac Enzymes: No results for input(s): CKTOTAL, CKMB, CKMBINDEX, TROPONINI in the last 168 hours. BNP (last 3 results) No results for input(s): PROBNP in the last 8760 hours. HbA1C: Recent Labs    11/01/18 0315  HGBA1C 6.0*   CBG: Recent Labs  Lab 11/01/18 0236 11/01/18 0855 11/01/18 1211  GLUCAP 140* 123* 115*   Lipid Profile: No results for input(s): CHOL, HDL, LDLCALC, TRIG, CHOLHDL, LDLDIRECT in the last 72 hours. Thyroid Function Tests: No results for input(s): TSH, T4TOTAL, FREET4, T3FREE, THYROIDAB in the last 72 hours. Anemia Panel: No results for input(s): VITAMINB12, FOLATE, FERRITIN, TIBC, IRON, RETICCTPCT in the last 72 hours. Sepsis Labs: Recent Labs  Lab 10/31/18 1939 10/31/18 2146 11/01/18 0516  LATICACIDVEN 3.6* 2.8* 1.5    Recent Results (from the past 240 hour(s))  SARS Coronavirus 2 (Hosp order,Performed in [facility] lab via [employer] ID)     Status: None   Collection Time: 10/31/18  8:52 PM   Specimen: Dry Nasal Swab ([employer] ID Now)  Result Value Ref Range Status   SARS Coronavirus 2 ([employer] ID Now) NEGATIVE NEGATIVE Final    Comment: (NOTE) SARS-CoV-2 target nucleic acids are NOT DETECTED. The SARS-CoV-2 RNA is generally detectable in upper and lower respiratory  specimens during the acute phase of infection.  Negativeresults do not preclude SARS-CoV-2 infection, do not rule out coinfections with other pathogens, and should not be used as the  sole basis for treatment or other patient management decisions.  Negative results must be combined with clinical observations, patient history, and epidemiological information. The expected result is Negative. Fact Sheet for Patients: http://www.gra[place].org/https://www.fda.gov/media/136524/download Fact Sheet for Healthcare Providers: EnviroConcern.sihttps://www.fda.gov/media/136523/download This test is not yet approved or cleared by the [place] FDA and  has been authorized for detection and/or diagnosis of SARS-CoV-2 by FDA under an Emergency Use Authorization (EUA).  This EUA will remain in effect (meaning this test can be used) for the duration of  the COVID19 declaration under Section 5 64(b)(1) of the Act, 21 U.S.C.  section [id number] 3(b)(1), unless the authorization is terminated or revoked sooner. Performed at [facility] [place], [address] [place], [place] [address]     Radiology Studies: Ct Abdomen Pelvis W Contrast  Result Date: 10/31/2018 CLINICAL DATA:  Gallbladder surgery 3 weeks ago. History of liver abscess. Fever. EXAM: CT ABDOMEN AND PELVIS WITH CONTRAST TECHNIQUE: Multidetector CT imaging of the abdomen and pelvis was performed using the standard protocol following bolus administration of intravenous contrast. CONTRAST:  80mL OMNIPAQUE IOHEXOL 300 MG/ML  SOLN COMPARISON:  MRI of the abdomen July 01, 2018. CT scan of the abdomen and pelvis June 11, 2018. FINDINGS: Lower chest: No suspicious nodules, masses, or infiltrates in the lung bases. Lower chest is unremarkable. Hepatobiliary: Patient is status post cholecystectomy. There is a collection of debris, fluid, and gas centered in the gallbladder fossa. There is an oval region of high attenuation within the fossa, likely blood products from recent surgery. The  fluid tracks superiorly along the anterior margin of the liver. Some low attenuation seen anterior to the liver on axial image 14 may represent some debris tracks superiorly. On image number 11, there is some high attenuation anterior to the liver which could represent a tiny amount of air tracking from the gallbladder fossa. The collection abuts the first and second portions of the duodenum which is thickened and likely secondarily inflamed. The greatest dimension of the abnormality in the gallbladder fossa is 7 by 9.9 by 9.4 cm. The liver parenchyma itself is normal in appearance. The portal vein is patent. Pancreas: Unremarkable. No pancreatic ductal dilatation or surrounding inflammatory changes. Spleen: Normal in size without focal abnormality. 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 Adrenal glands are otherwise normal. There are at least 3 cysts in the left kidney. No hydronephrosis or stones. No evidence of obstruction. No ureteral stones. The bladder is normal. Stomach/Bowel: The stomach is normal. There is thickening of the proximal duodenum due to the abscess in the gallbladder fossa resulting in secondary inflammation. The abscess likely arises from the recent surgery. The small bowel is otherwise normal. Colonic diverticulosis is seen without diverticulitis. The appendix is normal. Vascular/Lymphatic: Atherosclerotic changes are seen in the nonaneurysmal aorta. No adenopathy. Reproductive: Prostate is unremarkable. Other: No abdominal  wall hernia or abnormality. No abdominopelvic ascites. Musculoskeletal: No acute or significant osseous findings. IMPRESSION: 1. There is a 7 x 9.9 x 9.4 cm collection of fluid, debris, and gas centered in the gallbladder fossa with significant adjacent fat stranding. This finding is consistent with a postoperative abscess. The abscess abuts the proximal duodenum resulting in secondary wall thickening/inflammation. A small amount of  debris and gas may extend anteriorly along the liver extending towards the dome. 2. Diverticulosis without diverticulitis. 3. Atherosclerotic changes in the nonaneurysmal aorta. Electronically Signed   By: Gerome Samavid  Williams III M.D   On: 10/31/2018 21:26   Ct Image Guided Drainage By Percutaneous Catheter  Result Date: 11/01/2018 INDICATION: Cholecystectomy 3 weeks ago, gallbladder fossa fluid collection EXAM: CT DRAIN GALLBLADDER FOSSA ABSCESS MEDICATIONS: The patient is currently admitted to the hospital and receiving intravenous antibiotics. The antibiotics were administered within an appropriate time frame prior to the initiation of the procedure. ANESTHESIA/SEDATION: Fentanyl 100 mcg IV; Versed 2.0 mg IV Moderate Sedation Time:  13 minutes The patient was continuously monitored during the procedure by the interventional radiology nurse under my direct supervision. COMPLICATIONS: None immediate. PROCEDURE: Informed written consent was obtained from the patient after a thorough discussion of the procedural risks, benefits and alternatives. All questions were addressed. Maximal Sterile Barrier Technique was utilized including caps, mask, sterile gowns, sterile gloves, sterile drape, hand hygiene and skin antiseptic. A timeout was performed prior to the initiation of the procedure. Previous imaging reviewed. Patient positioned supine. Noncontrast localization CT performed. The large complex heterogeneous air-fluid collection in the gallbladder fossa was localized. Overlying skin marked for a lateral intercostal approach. Under sterile conditions and local anesthesia, an 18 gauge 10 cm access needle was advanced into the collection. Syringe aspiration yielded dark malodorous thick blood compatible with infected hematoma. Guidewire advanced followed by tract dilatation to insert a 14 [place] drain. Drain catheter position confirmed with CT. Sample sent for culture. Catheter secured with Prolene suture and connected  to external suction bulb. Sterile dressing applied. No immediate complication. Patient tolerated the procedure well. IMPRESSION: Successful CT-guided gallbladder fossa abscess drain placement. Electronically Signed   By: Judie PetitM.  Shick M.D.   On: 11/01/2018 12:09   Scheduled Meds:  fentaNYL       insulin aspart  0-9 Units Subcutaneous Q4H   lidocaine       midazolam       sodium chloride flush  5 mL Intracatheter Q8H   Continuous Infusions:  sodium chloride 150 mL/hr at 11/01/18 0700   piperacillin-tazobactam (ZOSYN)  IV 3.375 g (11/01/18 0328)    LOS: 1 day   Time spent: 40 min  Azucena FallenWilliam C Jacquees Gongora, DO [facility]  If 7PM-7AM, please contact night-coverage [email or web address] Password [email or web address] 11/01/2018, 12:50 PM

## 2018-11-02 LAB — CBC WITH DIFFERENTIAL/PLATELET
Abs Immature Granulocytes: 0.19 K/uL — ABNORMAL HIGH (ref 0.00–0.07)
Abs Immature Granulocytes: 0 K/uL (ref 0.00–0.07)
Band Neutrophils: 3 %
Basophils Absolute: 0 K/uL (ref 0.0–0.1)
Basophils Absolute: 0.5 K/uL — ABNORMAL HIGH (ref 0.0–0.1)
Basophils Relative: 0 %
Basophils Relative: 3 %
Eosinophils Absolute: 0 K/uL (ref 0.0–0.5)
Eosinophils Absolute: 0 K/uL (ref 0.0–0.5)
Eosinophils Relative: 0 %
Eosinophils Relative: 0 %
HCT: 24.3 % — ABNORMAL LOW (ref 39.0–52.0)
HCT: 24 % — ABNORMAL LOW (ref 39.0–52.0)
Hemoglobin: 7.6 g/dL — ABNORMAL LOW (ref 13.0–17.0)
Hemoglobin: 7.5 g/dL — ABNORMAL LOW (ref 13.0–17.0)
Immature Granulocytes: 2 %
Lymphocytes Relative: 3 %
Lymphocytes Relative: 11 %
Lymphs Abs: 0.3 K/uL — ABNORMAL LOW (ref 0.7–4.0)
Lymphs Abs: 1.7 K/uL (ref 0.7–4.0)
MCH: 26.4 pg (ref 26.0–34.0)
MCH: 26 pg (ref 26.0–34.0)
MCHC: 31.3 g/dL (ref 30.0–36.0)
MCHC: 31.3 g/dL (ref 30.0–36.0)
MCV: 84.4 fL (ref 80.0–100.0)
MCV: 83 fL (ref 80.0–100.0)
Monocytes Absolute: 0.2 K/uL (ref 0.1–1.0)
Monocytes Absolute: 0.6 K/uL (ref 0.1–1.0)
Monocytes Relative: 2 %
Monocytes Relative: 4 %
Neutro Abs: 8.5 K/uL — ABNORMAL HIGH (ref 1.7–7.7)
Neutro Abs: 12.6 K/uL — ABNORMAL HIGH (ref 1.7–7.7)
Neutrophils Relative %: 93 %
Neutrophils Relative %: 79 %
Platelets: 220 K/uL (ref 150–400)
Platelets: 226 K/uL (ref 150–400)
RBC: 2.88 MIL/uL — ABNORMAL LOW (ref 4.22–5.81)
RBC: 2.89 MIL/uL — ABNORMAL LOW (ref 4.22–5.81)
RDW: 16.5 % — ABNORMAL HIGH (ref 11.5–15.5)
RDW: 16.4 % — ABNORMAL HIGH (ref 11.5–15.5)
WBC: 9.2 K/uL (ref 4.0–10.5)
WBC: 15.4 K/uL — ABNORMAL HIGH (ref 4.0–10.5)
nRBC: 0 % (ref 0.0–0.2)
nRBC: 0 % (ref 0.0–0.2)
nRBC: 0 /100 WBC

## 2018-11-02 LAB — COMPREHENSIVE METABOLIC PANEL
ALT: 15 U/L (ref 0–44)
AST: 14 U/L — ABNORMAL LOW (ref 15–41)
Albumin: 2.4 g/dL — ABNORMAL LOW (ref 3.5–5.0)
Alkaline Phosphatase: 51 U/L (ref 38–126)
Anion gap: 10 (ref 5–15)
BUN: 12 mg/dL (ref 8–23)
CO2: 21 mmol/L — ABNORMAL LOW (ref 22–32)
Calcium: 8.6 mg/dL — ABNORMAL LOW (ref 8.9–10.3)
Chloride: 102 mmol/L (ref 98–111)
Creatinine, Ser: 1.24 mg/dL (ref 0.61–1.24)
GFR calc Af Amer: >60 mL/min (ref >60)
GFR calc non Af Amer: 56 mL/min — ABNORMAL LOW (ref >60)
Glucose, Bld: 132 mg/dL — ABNORMAL HIGH (ref 70–99)
Potassium: 3.8 mmol/L (ref 3.5–5.1)
Sodium: 133 mmol/L — ABNORMAL LOW (ref 135–145)
Total Bilirubin: 0.8 mg/dL (ref 0.3–1.2)
Total Protein: 5.5 g/dL — ABNORMAL LOW (ref 6.5–8.1)

## 2018-11-02 LAB — GLUCOSE, CAPILLARY
Glucose-Capillary: 135 mg/dL — ABNORMAL HIGH (ref 70–99)
Glucose-Capillary: 141 mg/dL — ABNORMAL HIGH (ref 70–99)
Glucose-Capillary: 133 mg/dL — ABNORMAL HIGH (ref 70–99)
Glucose-Capillary: 181 mg/dL — ABNORMAL HIGH (ref 70–99)

## 2018-11-02 LAB — CBC
HCT: 23.4 % — ABNORMAL LOW (ref 39.0–52.0)
Hemoglobin: 7.4 g/dL — ABNORMAL LOW (ref 13.0–17.0)
MCH: 26.2 pg (ref 26.0–34.0)
MCHC: 31.6 g/dL (ref 30.0–36.0)
MCV: 83 fL (ref 80.0–100.0)
Platelets: 243 K/uL (ref 150–400)
RBC: 2.82 MIL/uL — ABNORMAL LOW (ref 4.22–5.81)
RDW: 16.6 % — ABNORMAL HIGH (ref 11.5–15.5)
WBC: 9.3 K/uL (ref 4.0–10.5)
nRBC: 0 % (ref 0.0–0.2)

## 2018-11-02 LAB — LIPASE, BLOOD: Lipase: 24 U/L (ref 11–51)

## 2018-11-02 MED ORDER — LORAZEPAM 2 MG/ML IJ SOLN
1.0000 mg | Freq: Once | INTRAMUSCULAR | Status: AC
Start: 2018-11-02 — End: 2018-11-02
  Administered 2018-11-02: 12:00:00 1 mg via INTRAVENOUS
  Filled 2018-11-02: qty 1

## 2018-11-02 NOTE — Progress Notes (Signed)
Subjective/Chief Complaint: More comfortable today after drain placement   Objective: Vital signs in last 24 hours: Temp:  [98.1 F (36.7 C)-98.9 F (37.2 C)] 98.9 F (37.2 C) (07/05 0814) Pulse Rate:  [89-102] 102 (07/05 0814) Resp:  [15-26] 26 (07/05 0814) BP: (112-160)/(58-76) 159/68 (07/05 0814) SpO2:  [90 %-100 %] 98 % (07/05 0814) Last BM Date: 10/31/18  Intake/Output from previous day: 07/04 0701 - 07/05 0700 In: 245 [P.O.:240] Out: 1200 [Urine:1100; Drains:100] Intake/Output this shift: Total I/O In: 40 [Other:40] Out: -   Exam: Awake and alert Abdomen soft, minimally tender, drain looks like old hematoma  Lab Results:  Recent Labs    11/01/18 0315 11/02/18 0252  WBC 15.2* 9.3  HGB 8.1* 7.4*  HCT 25.8* 23.4*  PLT 262 243   BMET Recent Labs    10/31/18 1836 11/02/18 0252  NA 133* 133*  K 5.1 3.8  CL 97* 102  CO2 20* 21*  GLUCOSE 197* 132*  BUN 21 12  CREATININE 1.53* 1.24  CALCIUM 9.5 8.6*   PT/INR No results for input(s): LABPROT, INR in the last 72 hours. ABG No results for input(s): PHART, HCO3 in the last 72 hours.  Invalid input(s): PCO2, PO2  Studies/Results: Ct Abdomen Pelvis W Contrast  Result Date: 10/31/2018 CLINICAL DATA:  Gallbladder surgery 3 weeks ago. History of liver abscess. Fever. EXAM: CT ABDOMEN AND PELVIS WITH CONTRAST TECHNIQUE: Multidetector CT imaging of the abdomen and pelvis was performed using the standard protocol following bolus administration of intravenous contrast. CONTRAST:  80mL OMNIPAQUE IOHEXOL 300 MG/ML  SOLN COMPARISON:  MRI of the abdomen July 01, 2018. CT scan of the abdomen and pelvis June 11, 2018. FINDINGS: Lower chest: No suspicious nodules, masses, or infiltrates in the lung bases. Lower chest is unremarkable. Hepatobiliary: Patient is status post cholecystectomy. There is a collection of debris, fluid, and gas centered in the gallbladder fossa. There is an oval region of high attenuation  within the fossa, likely blood products from recent surgery. The fluid tracks superiorly along the anterior margin of the liver. Some low attenuation seen anterior to the liver on axial image 14 may represent some debris tracks superiorly. On image number 11, there is some high attenuation anterior to the liver which could represent a tiny amount of air tracking from the gallbladder fossa. The collection abuts the first and second portions of the duodenum which is thickened and likely secondarily inflamed. The greatest dimension of the abnormality in the gallbladder fossa is 7 by 9.9 by 9.4 cm. The liver parenchyma itself is normal in appearance. The portal vein is patent. Pancreas: Unremarkable. No pancreatic ductal dilatation or surrounding inflammatory changes. Spleen: Normal in size without focal abnormality. 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 Adrenal glands are otherwise normal. There are at least 3 cysts in the left kidney. No hydronephrosis or stones. No evidence of obstruction. No ureteral stones. The bladder is normal. Stomach/Bowel: The stomach is normal. There is thickening of the proximal duodenum due to the abscess in the gallbladder fossa resulting in secondary inflammation. The abscess likely arises from the recent surgery. The small bowel is otherwise normal. Colonic diverticulosis is seen without diverticulitis. The appendix is normal. Vascular/Lymphatic: Atherosclerotic changes are seen in the nonaneurysmal aorta. No adenopathy. Reproductive: Prostate is unremarkable. Other: No abdominal wall hernia or abnormality. No abdominopelvic ascites. Musculoskeletal: No acute or significant osseous findings. IMPRESSION: 1. There is a 7 x 9.9 x 9.4 cm collection of  fluid, debris, and gas centered in the gallbladder fossa with significant adjacent fat stranding. This finding is consistent with a postoperative abscess. The abscess abuts the proximal duodenum  resulting in secondary wall thickening/inflammation. A small amount of debris and gas may extend anteriorly along the liver extending towards the dome. 2. Diverticulosis without diverticulitis. 3. Atherosclerotic changes in the nonaneurysmal aorta. Electronically Signed   By: Dorise Bullion III M.D   On: 10/31/2018 21:26   Ct Image Guided Drainage By Percutaneous Catheter  Result Date: 11/01/2018 INDICATION: Cholecystectomy 3 weeks ago, gallbladder fossa fluid collection EXAM: CT DRAIN GALLBLADDER FOSSA ABSCESS MEDICATIONS: The patient is currently admitted to the hospital and receiving intravenous antibiotics. The antibiotics were administered within an appropriate time frame prior to the initiation of the procedure. ANESTHESIA/SEDATION: Fentanyl 100 mcg IV; Versed 2.0 mg IV Moderate Sedation Time:  13 minutes The patient was continuously monitored during the procedure by the interventional radiology nurse under my direct supervision. COMPLICATIONS: None immediate. PROCEDURE: Informed written consent was obtained from the patient after a thorough discussion of the procedural risks, benefits and alternatives. All questions were addressed. Maximal Sterile Barrier Technique was utilized including caps, mask, sterile gowns, sterile gloves, sterile drape, hand hygiene and skin antiseptic. A timeout was performed prior to the initiation of the procedure. Previous imaging reviewed. Patient positioned supine. Noncontrast localization CT performed. The large complex heterogeneous air-fluid collection in the gallbladder fossa was localized. Overlying skin marked for a lateral intercostal approach. Under sterile conditions and local anesthesia, an 18 gauge 10 cm access needle was advanced into the collection. Syringe aspiration yielded dark malodorous thick blood compatible with infected hematoma. Guidewire advanced followed by tract dilatation to insert a 14 [place] drain. Drain catheter position confirmed with CT. Sample  sent for culture. Catheter secured with Prolene suture and connected to external suction bulb. Sterile dressing applied. No immediate complication. Patient tolerated the procedure well. IMPRESSION: Successful CT-guided gallbladder fossa abscess drain placement. Electronically Signed   By: Jerilynn Mages.  Shick M.D.   On: 11/01/2018 12:09    Anti-infectives: Anti-infectives (From admission, onward)   Start     Dose/Rate Route Frequency Ordered Stop   11/01/18 0600  ceFEPIme (MAXIPIME) 1 g in sodium chloride 0.9 % 100 mL IVPB  Status:  Discontinued     1 g 200 mL/hr over 30 Minutes Intravenous Every 8 hours 10/31/18 1957 11/01/18 0256   11/01/18 0400  piperacillin-tazobactam (ZOSYN) IVPB 3.375 g     3.375 g 12.5 mL/hr over 240 Minutes Intravenous Every 8 hours 11/01/18 0258     10/31/18 1944  ceFEPIme (MAXIPIME) 2 g injection    Note to Pharmacy: Daine Gravel   : cabinet override      10/31/18 1944 11/01/18 0759   10/31/18 1915  ceFEPIme (MAXIPIME) 2 g in sodium chloride 0.9 % 100 mL IVPB     2 g 200 mL/hr over 30 Minutes Intravenous  Once 10/31/18 1911 10/31/18 2052   10/31/18 1915  metroNIDAZOLE (FLAGYL) IVPB 500 mg     500 mg 100 mL/hr over 60 Minutes Intravenous  Once 10/31/18 1911 10/31/18 2100      Assessment/Plan:  Gallbladder fossa abscess  S/p IR drain that looks like old infected hematoma. Continue to monitor for signs of bile leak. Awaiting cultures Ok to advance po from surgery standpoint   LOS: 2 days    Coralie Keens 11/02/2018

## 2018-11-02 NOTE — Progress Notes (Signed)
PROGRESS NOTE    [name]  ZOX:[id number] DOB: [birth year] DOA: 10/31/2018 PCP: Karle PlumberArvind, Moogali M, MD   Brief Narrative:  [name] a 73 y.o.malewith medical history significant ofCAD status post CABG x4, type 2 diabetes, hypertension, hyperlipidemia,diverticulitis, liver abscess, cholecystitis status post percutaneous cholecystostomy tube 1/12 and eventually lap chole on 6/9 presented to med [facility] [place] [facility] with complaints of abdominal pain.Patient states he has not been doing well since his recent surgery and has continued to have abdominal pain which is getting progressively worse. The pain is located in his right mid abdomen. He feels nauseous but has not vomited. He is having diarrhea. He has not been eating much. Denies any fevers, chills, chest pain, shortness of breath, or cough. Denies exposure to any individual with known COVID-19. In WJ:[id number]:Temperature 102.4 F.Hemodynamically stable. White count 24.6 with left shift. Lactic acid 3.6 >2.8with IV fluid. Hemoglobin 10.0, baseline ranging from 8-11. Creatinine 1.5, baseline 1.1-1.3. Lipase and LFTs normal. UA not suggestive of infection. CT abdomen pelvis showing a 7 x 9.9 x 9.4 cm collection of fluid, debris, and gas centered in the gallbladder fossa with significant adjacent fat stranding. Findings consistent with a postoperative abscess.The abscess abuts the proximal duodenum resulting in secondary wall thickening/inflammation.A small amount of debris and gas may extend anteriorly along the liver extending towards the dome. ED provider discussed the case with general surgery Dr. Magnus IvanBlackman who recommended admission under hospitalist service.Patient received cefepime, metronidazole, and 1.5 L fluid boluses in the ED. he was continued on Zosyn.  Patient underwent Percutaneous drainage with IR on 11/01/2018 and has gallbladder fossa abscess drained now.  Consultants:   General surgery  Procedures:    Percutaneous drainage with IR on 11/01/2018  Antimicrobials:   Received a dose of cefepime and metronidazole on 10/31/2018.  Started on Zosyn on 10/31/2018   Subjective: Patient seen and examined.  He states that he feels rough.  He could not elaborate much.  Pain is controlled.  He has remained afebrile.  No bowel movements since admission.  Passing gas on and off.  Objective: Vitals:   11/01/18 1636 11/01/18 2011 11/01/18 2316 11/02/18 0405  BP: (!) 145/74 (!) 160/76 135/74 (!) 144/74  Pulse: 100 97 97 92  Resp: (!) 24 18 15 20   Temp: 98.4 F (36.9 C) 98.1 F (36.7 C) 98.8 F (37.1 C) 98.4 F (36.9 C)  TempSrc: Oral Oral Oral Oral  SpO2:  96% 94% 96%  Weight:      Height:        Intake/Output Summary (Last 24 hours) at 11/02/2018 0752 Last data filed at 11/02/2018 [id number] Gross per 24 hour  Intake 245 ml  Output 1200 ml  Net -955 ml   Filed Weights   10/31/18 1720 11/01/18 0140  Weight: 105.7 kg 106.3 kg    Examination:  General exam: Appears calm and comfortable  Respiratory system: Clear to auscultation. Respiratory effort normal. Cardiovascular system: S1 & S2 heard, RRR. No JVD, murmurs, rubs, gallops or clicks. No pedal edema. Gastrointestinal system: Abdomen is mildly distended, soft and tender mostly in the left upper quadrant, epigastric and right upper quadrant area. No organomegaly or masses felt. Normal bowel sounds heard. Central nervous system: Alert and oriented. No focal neurological deficits. Extremities: Symmetric 5 x 5 power. Skin: No rashes, lesions or ulcers Psychiatry: Judgement and insight appear normal. Mood & affect appropriate.    Data Reviewed: I have personally reviewed following labs and imaging studies  CBC: Recent  Labs  Lab 10/31/18 1836 11/01/18 0315 11/02/18 0252  WBC 24.6* 15.2* 9.3  NEUTROABS 21.5*  --   --   HGB 10.0* 8.1* 7.4*  HCT 32.5* 25.8* 23.4*  MCV 84.6 83.8 83.0  PLT 430* 262 243   Basic Metabolic Panel: Recent  Labs  Lab 10/31/18 1836 11/02/18 0252  NA 133* 133*  K 5.1 3.8  CL 97* 102  CO2 20* 21*  GLUCOSE 197* 132*  BUN 21 12  CREATININE 1.53* 1.24  CALCIUM 9.5 8.6*   GFR: Estimated Creatinine Clearance: 62.9 mL/min (by C-G formula based on SCr of 1.24 mg/dL). Liver Function Tests: Recent Labs  Lab 10/31/18 1836 11/02/18 0252  AST 20 14*  ALT 18 15  ALKPHOS 62 51  BILITOT 0.6 0.8  PROT 7.1 5.5*  ALBUMIN 3.6 2.4*   Recent Labs  Lab 10/31/18 1836 11/02/18 0252  LIPASE 26 24   No results for input(s): AMMONIA in the last 168 hours. Coagulation Profile: No results for input(s): INR, PROTIME in the last 168 hours. Cardiac Enzymes: No results for input(s): CKTOTAL, CKMB, CKMBINDEX, TROPONINI in the last 168 hours. BNP (last 3 results) No results for input(s): PROBNP in the last 8760 hours. HbA1C: Recent Labs    11/01/18 0315  HGBA1C 6.0*   CBG: Recent Labs  Lab 11/01/18 0855 11/01/18 1211 11/01/18 1635 11/01/18 2048 11/02/18 0613  GLUCAP 123* 115* 123* 145* 135*   Lipid Profile: No results for input(s): CHOL, HDL, LDLCALC, TRIG, CHOLHDL, LDLDIRECT in the last 72 hours. Thyroid Function Tests: No results for input(s): TSH, T4TOTAL, FREET4, T3FREE, THYROIDAB in the last 72 hours. Anemia Panel: No results for input(s): VITAMINB12, FOLATE, FERRITIN, TIBC, IRON, RETICCTPCT in the last 72 hours. Sepsis Labs: Recent Labs  Lab 10/31/18 1939 10/31/18 2146 11/01/18 0516  LATICACIDVEN 3.6* 2.8* 1.5    Recent Results (from the past 240 hour(s))  Blood culture (routine x 2)     Status: None (Preliminary result)   Collection Time: 10/31/18  7:40 PM   Specimen: BLOOD  Result Value Ref Range Status   Specimen Description   Final    BLOOD LEFT HAND Performed at [facility], [address] [place], [place] [address]    Special Requests   Final    BOTTLES DRAWN AEROBIC ONLY Blood Culture results may not be optimal due to an inadequate volume of blood received  in culture bottles Performed at [facility] [place], [address] [place], [place] [address]    Culture   Final    NO GROWTH < 24 HOURS Performed at [facility], [address] [place], [place] [address]    Report Status PENDING  Incomplete  SARS Coronavirus 2 (Hosp order,Performed in [facility] lab via [facility] ID)     Status: None   Collection Time: 10/31/18  8:52 PM   Specimen: Dry Nasal Swab ([employer] ID Now)  Result Value Ref Range Status   SARS Coronavirus 2 (A[employer] ID Now) NEGATIVE NEGATIVE Final    Comment: (NOTE) SARS-CoV-2 target nucleic acids are NOT DETECTED. The SARS-CoV-2 RNA is generally detectable in upper and lower respiratory specimens during the acute phase of infection.  Negativeresults do not preclude SARS-CoV-2 infection, do not rule out coinfections with other pathogens, and should not be used as the  sole basis for treatment or other patient management decisions.  Negative results must be combined with clinical observations, patient history, and epidemiological information. The expected result is Negative.  Fact Sheet for Patients: http://www.graves-ford.org/ Fact Sheet for Healthcare Providers: EnviroConcern.si This test is not yet approved or cleared by the [place] FDA and  has been authorized for detection and/or diagnosis of SARS-CoV-2 by FDA under an Emergency Use Authorization (EUA).  This EUA will remain in effect (meaning this test can be used) for the duration of  the COVID19 declaration under Section 5 64(b)(1) of the Act, 21 U.S.C.  section [phone] 3(b)(1), unless the authorization is terminated or revoked sooner. Performed at [facility], [address] [place], [place] [address]   Aerobic/Anaerobic Culture (surgical/deep wound)     Status: None (Preliminary result)   Collection Time: 11/01/18 11:33 AM   Specimen: Abdomen; Abscess  Result Value Ref Range Status   Specimen  Description ABDOMEN  Final   Special Requests Normal  Final   Gram Stain   Final    MODERATE WBC PRESENT, PREDOMINANTLY PMN MODERATE GRAM NEGATIVE RODS FEW GRAM POSITIVE COCCI IN PAIRS Performed at [facility], [address] [place], [place] [address]    Culture PENDING  Incomplete   Report Status PENDING  Incomplete      Radiology Studies: Ct Abdomen Pelvis W Contrast  Result Date: 10/31/2018 CLINICAL DATA:  Gallbladder surgery 3 weeks ago. History of liver abscess. Fever. EXAM: CT ABDOMEN AND PELVIS WITH CONTRAST TECHNIQUE: Multidetector CT imaging of the abdomen and pelvis was performed using the standard protocol following bolus administration of intravenous contrast. CONTRAST:  80mL OMNIPAQUE IOHEXOL 300 MG/ML  SOLN COMPARISON:  MRI of the abdomen July 01, 2018. CT scan of the abdomen and pelvis June 11, 2018. FINDINGS: Lower chest: No suspicious nodules, masses, or infiltrates in the lung bases. Lower chest is unremarkable. Hepatobiliary: Patient is status post cholecystectomy. There is a collection of debris, fluid, and gas centered in the gallbladder fossa. There is an oval region of high attenuation within the fossa, likely blood products from recent surgery. The fluid tracks superiorly along the anterior margin of the liver. Some low attenuation seen anterior to the liver on axial image 14 may represent some debris tracks superiorly. On image number 11, there is some high attenuation anterior to the liver which could represent a tiny amount of air tracking from the gallbladder fossa. The collection abuts the first and second portions of the duodenum which is thickened and likely secondarily inflamed. The greatest dimension of the abnormality in the gallbladder fossa is 7 by 9.9 by 9.4 cm. The liver parenchyma itself is normal in appearance. The portal vein is patent. Pancreas: Unremarkable. No pancreatic ductal dilatation or surrounding inflammatory changes. Spleen: Normal in  size without focal abnormality. 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 Adrenal glands are otherwise normal. There are at least 3 cysts in the left kidney. No hydronephrosis or stones. No evidence of obstruction. No ureteral stones. The bladder is normal. Stomach/Bowel: The stomach is normal. There is thickening of the proximal duodenum due to the abscess in the gallbladder fossa resulting in secondary inflammation. The abscess likely arises from the recent surgery. The small bowel is otherwise normal. Colonic diverticulosis is seen without diverticulitis. The appendix is normal. Vascular/Lymphatic: Atherosclerotic changes are seen in the nonaneurysmal aorta. No adenopathy. Reproductive: Prostate is unremarkable. Other: No abdominal wall hernia or abnormality. No abdominopelvic ascites. Musculoskeletal: No acute or significant osseous findings. IMPRESSION: 1. There is a 7 x 9.9 x 9.4 cm collection of fluid, debris, and gas centered in the  gallbladder fossa with significant adjacent fat stranding. This finding is consistent with a postoperative abscess. The abscess abuts the proximal duodenum resulting in secondary wall thickening/inflammation. A small amount of debris and gas may extend anteriorly along the liver extending towards the dome. 2. Diverticulosis without diverticulitis. 3. Atherosclerotic changes in the nonaneurysmal aorta. Electronically Signed   By: Dorise Bullion III M.D   On: 10/31/2018 21:26   Ct Image Guided Drainage By Percutaneous Catheter  Result Date: 11/01/2018 INDICATION: Cholecystectomy 3 weeks ago, gallbladder fossa fluid collection EXAM: CT DRAIN GALLBLADDER FOSSA ABSCESS MEDICATIONS: The patient is currently admitted to the hospital and receiving intravenous antibiotics. The antibiotics were administered within an appropriate time frame prior to the initiation of the procedure. ANESTHESIA/SEDATION: Fentanyl 100 mcg IV; Versed 2.0 mg IV Moderate  Sedation Time:  13 minutes The patient was continuously monitored during the procedure by the interventional radiology nurse under my direct supervision. COMPLICATIONS: None immediate. PROCEDURE: Informed written consent was obtained from the patient after a thorough discussion of the procedural risks, benefits and alternatives. All questions were addressed. Maximal Sterile Barrier Technique was utilized including caps, mask, sterile gowns, sterile gloves, sterile drape, hand hygiene and skin antiseptic. A timeout was performed prior to the initiation of the procedure. Previous imaging reviewed. Patient positioned supine. Noncontrast localization CT performed. The large complex heterogeneous air-fluid collection in the gallbladder fossa was localized. Overlying skin marked for a lateral intercostal approach. Under sterile conditions and local anesthesia, an 18 gauge 10 cm access needle was advanced into the collection. Syringe aspiration yielded dark malodorous thick blood compatible with infected hematoma. Guidewire advanced followed by tract dilatation to insert a 14 [place] drain. Drain catheter position confirmed with CT. Sample sent for culture. Catheter secured with Prolene suture and connected to external suction bulb. Sterile dressing applied. No immediate complication. Patient tolerated the procedure well. IMPRESSION: Successful CT-guided gallbladder fossa abscess drain placement. Electronically Signed   By: Jerilynn Mages.  Shick M.D.   On: 11/01/2018 12:09    Scheduled Meds:  insulin aspart  0-9 Units Subcutaneous TID WC   sodium chloride flush  5 mL Intracatheter Q8H   Continuous Infusions:  piperacillin-tazobactam (ZOSYN)  IV 3.375 g (11/02/18 0627)     LOS: 2 days   Assessment & Plan:   Principal Problem:   Abscess, intra-abdominal, postoperative Active Problems:   Hx of CABG   Coronary artery disease   Non-insulin dependent type 2 diabetes mellitus ([place])   AKI (acute kidney injury) ([place])    RBBB   Sepsis secondary to gallbladder fossa abscess due to postoperative complication present on admission: He remains afebrile for at least 24 hours.  Leukocytosis improving.  Status post abscess drainage and drain placement at right upper quadrant gallbladder fossa by IR on 11/01/2018.  General surgery following.  He is on clear liquids.  Continue Zosyn.  Culture negative.  Will defer management to general surgery.  Eliquis on hold for now.  Will resume once cleared by general surgery.  Anion gap metabolic acidosis in the setting of lactic acidosis, present on admission: Resolved.  Continue gentle IV hydration.  Acute on chronic anemia: His hemoglobin at baseline is around 10.  Currently 7.4.  No active signs of bleeding however his drainage in the bulb is bloody.  We will recheck his hemoglobin later today and transfuse if drops less than 4.  CAD status post CABG x4 in December 2019, new RBBB: EKG shows right bundle branch block which appears to be new compared  to prior tracing earlier this year.  No signs of ACS.  Continue monitoring.  Troponin negative at admission.  Well-controlled type 2 diabetes mellitus: Lab hemoglobin A1c 6.0.  Blood sugar control.  Continue SSI.  AKI: Resolved.  DVT prophylaxis: SCD Code Status: Full code Family Communication: No family member present at bedside.  Plan discussed with patient in detail. Disposition Plan: To be determined based on clinical progress.   Time spent: 30 minutes   Hughie Clossavi Moniqua Engebretsen, MD [facility] Pager [phone]  If 7PM-7AM, please contact night-coverage [email or web address] Password [email or web address] 11/02/2018, 7:52 AM

## 2018-11-02 NOTE — Progress Notes (Signed)
Pt noted with severe chills & shaking w/increased R / HR / BP.  MD notified and new orders for ativan, CBC.

## 2018-11-02 NOTE — Plan of Care (Signed)
  Problem: Elimination: Goal: Will not experience complications related to bowel motility Outcome: Progressing Goal: Will not experience complications related to urinary retention Outcome: Progressing   

## 2018-11-02 NOTE — Progress Notes (Signed)
Pt requested assistance at 1225 pm to void.  NT arrived and assisted pt with urinal.  Upon standing to use urinal, pt didn't feel like he could stand up very well and leaned forward to hold on to the bed.  Pt then told the NT he had to sit down and kneeled on the floor.

## 2018-11-02 NOTE — Progress Notes (Signed)
Referring Physician(s): * No referring provider recorded for this case *  Supervising Physician: [name], Calvin  [facility]  [facility]  Chief Complaint: Gall bladder fossa abscess  Subjective: "I feel better than yesterday." Was able to tolerate a small amount of breakfast.  Some tenderness related to drain site.   Allergies: Ciprofloxacin and Erythromycin ethylsuccinate  Medications: Prior to Admission medications   Medication Sig Start Date End Date Taking? Authorizing Provider  acetaminophen (TYLENOL) 500 MG tablet Take 1,000 mg by mouth daily as needed for moderate pain.   Yes [provider]  amiodarone (PACERONE) 200 MG tablet TAKE 1 TABLET BY MOUTH DAILY Patient taking differently: Take 200 mg by mouth daily.  07/04/18  Yes Runell GessBerry, Jonathan J, MD  amLODipine (NORVASC) 5 MG tablet Take 5 mg by mouth daily. 02/05/18  Yes [provider]  aspirin EC 81 MG tablet Take 1 tablet (81 mg total) by mouth daily. Patient taking differently: Take 81 mg by mouth every evening.  05/01/18  Yes Kilroy, Eda PaschalLuke K, PA-C  Cholecalciferol (VITAMIN D3) 2000 units TABS Take 2,000 Units by mouth 2 (two) times daily.    Yes [provider]  docusate sodium (COLACE) 100 MG capsule Take 100 mg by mouth 2 (two) times daily.    Yes [provider]  ELIQUIS 5 MG TABS tablet TAKE ONE TABLET BY MOUTH TWICE DAILY 06/03/18  Yes Runell GessBerry, Jonathan J, MD  finasteride (PROSCAR) 5 MG tablet Take 5 mg by mouth daily. 02/05/18  Yes [provider]  loratadine (CLARITIN) 10 MG tablet Take 10 mg by mouth daily.   Yes [provider]  metFORMIN (GLUCOPHAGE) 1000 MG tablet Take 1,000 mg by mouth 2 (two) times daily. 02/05/18  Yes [provider]  metoprolol tartrate (LOPRESSOR) 25 MG tablet Take 1 tablet (25 mg total) by mouth 2 (two) times daily. 05/20/18  Yes Runell GessBerry, Jonathan J, MD  Multiple Vitamins-Minerals (MULTIVITAMIN ADULT PO) Take 1 tablet by mouth  daily.   Yes [provider]  oxyCODONE (OXY IR/ROXICODONE) 5 MG immediate release tablet Take 1 tablet (5 mg total) by mouth every 6 (six) hours as needed for severe pain. Patient not taking: Reported on 11/01/2018 10/07/18   Almond LintByerly, Faera, MD     Vital Signs: BP (!) 159/68 (BP Location: Right Arm)    Pulse (!) 102    Temp 98.9 F (37.2 C) (Axillary)    Resp (!) 26    Ht 5\' 11"  (1.803 m)    Wt 234 lb 4.8 oz (106.3 kg)    SpO2 98%    BMI 32.68 kg/m   Physical Exam  NAD, alert, resting in bed.  Abdomen:  RUQ drain in place. Insertion site c/d/i. Signifcant amount of thick, dark, bloody drainage with large amount of clot in tubing and bulb. Flushes slowly.  Imaging: Ct Abdomen Pelvis W Contrast  Result Date: 10/31/2018 CLINICAL DATA:  Gallbladder surgery 3 weeks ago. History of liver abscess. Fever. EXAM: CT ABDOMEN AND PELVIS WITH CONTRAST TECHNIQUE: Multidetector CT imaging of the abdomen and pelvis was performed using the standard protocol following bolus administration of intravenous contrast. CONTRAST:  80mL OMNIPAQUE IOHEXOL 300 MG/ML  SOLN COMPARISON:  MRI of the abdomen July 01, 2018. CT scan of the abdomen and pelvis June 11, 2018. FINDINGS: Lower chest: No suspicious nodules, masses, or infiltrates in the lung bases. Lower chest is unremarkable. Hepatobiliary: Patient is status post cholecystectomy. There is a collection of debris, fluid, and  gas centered in the gallbladder fossa. There is an oval region of high attenuation within the fossa, likely blood products from recent surgery. The fluid tracks superiorly along the anterior margin of the liver. Some low attenuation seen anterior to the liver on axial image 14 may represent some debris tracks superiorly. On image number 11, there is some high attenuation anterior to the liver which could represent a tiny amount of air tracking from the gallbladder fossa. The collection abuts the first and second portions of the duodenum which  is thickened and likely secondarily inflamed. The greatest dimension of the abnormality in the gallbladder fossa is 7 by 9.9 by 9.4 cm. The liver parenchyma itself is normal in appearance. The portal vein is patent. Pancreas: Unremarkable. No pancreatic ductal dilatation or surrounding inflammatory changes. Spleen: Normal in size without focal abnormality. 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 Adrenal glands are otherwise normal. There are at least 3 cysts in the left kidney. No hydronephrosis or stones. No evidence of obstruction. No ureteral stones. The bladder is normal. Stomach/Bowel: The stomach is normal. There is thickening of the proximal duodenum due to the abscess in the gallbladder fossa resulting in secondary inflammation. The abscess likely arises from the recent surgery. The small bowel is otherwise normal. Colonic diverticulosis is seen without diverticulitis. The appendix is normal. Vascular/Lymphatic: Atherosclerotic changes are seen in the nonaneurysmal aorta. No adenopathy. Reproductive: Prostate is unremarkable. Other: No abdominal wall hernia or abnormality. No abdominopelvic ascites. Musculoskeletal: No acute or significant osseous findings. IMPRESSION: 1. There is a 7 x 9.9 x 9.4 cm collection of fluid, debris, and gas centered in the gallbladder fossa with significant adjacent fat stranding. This finding is consistent with a postoperative abscess. The abscess abuts the proximal duodenum resulting in secondary wall thickening/inflammation. A small amount of debris and gas may extend anteriorly along the liver extending towards the dome. 2. Diverticulosis without diverticulitis. 3. Atherosclerotic changes in the nonaneurysmal aorta. Electronically Signed   By: Dorise Bullion III M.D   On: 10/31/2018 21:26   Ct Image Guided Drainage By Percutaneous Catheter  Result Date: 11/01/2018 INDICATION: Cholecystectomy 3 weeks ago, gallbladder fossa fluid  collection EXAM: CT DRAIN GALLBLADDER FOSSA ABSCESS MEDICATIONS: The patient is currently admitted to the hospital and receiving intravenous antibiotics. The antibiotics were administered within an appropriate time frame prior to the initiation of the procedure. ANESTHESIA/SEDATION: Fentanyl 100 mcg IV; Versed 2.0 mg IV Moderate Sedation Time:  13 minutes The patient was continuously monitored during the procedure by the interventional radiology nurse under my direct supervision. COMPLICATIONS: None immediate. PROCEDURE: Informed written consent was obtained from the patient after a thorough discussion of the procedural risks, benefits and alternatives. All questions were addressed. Maximal Sterile Barrier Technique was utilized including caps, mask, sterile gowns, sterile gloves, sterile drape, hand hygiene and skin antiseptic. A timeout was performed prior to the initiation of the procedure. Previous imaging reviewed. Patient positioned supine. Noncontrast localization CT performed. The large complex heterogeneous air-fluid collection in the gallbladder fossa was localized. Overlying skin marked for a lateral intercostal approach. Under sterile conditions and local anesthesia, an 18 gauge 10 cm access needle was advanced into the collection. Syringe aspiration yielded dark malodorous thick blood compatible with infected hematoma. Guidewire advanced followed by tract dilatation to insert a 14 [place] drain. Drain catheter position confirmed with CT. Sample sent for culture. Catheter secured with Prolene suture and connected to external suction bulb. Sterile dressing applied. No immediate  complication. Patient tolerated the procedure well. IMPRESSION: Successful CT-guided gallbladder fossa abscess drain placement. Electronically Signed   By: Judie PetitM.  Shick M.D.   On: 11/01/2018 12:09    Labs:  CBC: Recent Labs    10/06/18 0903 10/31/18 1836 11/01/18 0315 11/02/18 0252  WBC 8.4 24.6* 15.2* 9.3  HGB 11.7* 10.0*  8.1* 7.4*  HCT 37.2* 32.5* 25.8* 23.4*  PLT 257 430* 262 243    COAGS: Recent Labs    03/14/18 1610 03/26/18 1146 03/31/18 1458 04/08/18 0238 05/10/18 0907 10/06/18 0903  INR 0.96 0.96 1.37 1.06 1.42 0.9  APTT 32 31 29  --   --   --     BMP: Recent Labs    05/15/18 0415 10/06/18 0903 10/31/18 1836 11/02/18 0252  NA 135 136 133* 133*  K 3.7 4.7 5.1 3.8  CL 102 101 97* 102  CO2 24 21* 20* 21*  GLUCOSE 128* 122* 197* 132*  BUN 12 18 21 12   CALCIUM 8.9 9.5 9.5 8.6*  CREATININE 1.21 1.31* 1.53* 1.24  GFRNONAA 58* 53* 44* 56*  GFRAA >60 >60 50* >60    LIVER FUNCTION TESTS: Recent Labs    05/15/18 0415 10/06/18 0903 10/31/18 1836 11/02/18 0252  BILITOT 0.5 0.7 0.6 0.8  AST 25 32 20 14*  ALT 30 25 18 15   ALKPHOS 130* 51 62 51  PROT 5.3* 6.6 7.1 5.5*  ALBUMIN 2.1* 3.7 3.6 2.4*    Assessment and Plan: Intra-abdominal fluid collection, gall bladder fossa abscess s/p drain placement 11/01/18 by Dr. Miles CostainShick.  Patient feeling better today.  Afebrile.  WBC improved to 9.3 Culture pending but with GNR and few GPC in pairs.  Remains on Zosyn.  Output is dark, thick, and bloody which has a propensity to clot in the tubing. Aggressively flushed today by PA and removed as much clot as possible. Increase flushes to TID. IR following.   Electronically Signed: Hoyt KochKacie Sue-Ellen Kallista Pae, PA 11/02/2018, 11:18 AM   I spent a total of 15 Minutes at the the patient's bedside AND on the patient's hospital floor or unit, greater than 50% of which was counseling/coordinating care for intra-abdominal fluid collection.

## 2018-11-03 LAB — COMPREHENSIVE METABOLIC PANEL
ALT: 22 U/L (ref 0–44)
AST: 22 U/L (ref 15–41)
Albumin: 2.3 g/dL — ABNORMAL LOW (ref 3.5–5.0)
Alkaline Phosphatase: 64 U/L (ref 38–126)
Anion gap: 13 (ref 5–15)
BUN: 12 mg/dL (ref 8–23)
CO2: 22 mmol/L (ref 22–32)
Calcium: 8.6 mg/dL — ABNORMAL LOW (ref 8.9–10.3)
Chloride: 98 mmol/L (ref 98–111)
Creatinine, Ser: 1.31 mg/dL — ABNORMAL HIGH (ref 0.61–1.24)
GFR calc Af Amer: >60 mL/min (ref >60)
GFR calc non Af Amer: 53 mL/min — ABNORMAL LOW (ref >60)
Glucose, Bld: 132 mg/dL — ABNORMAL HIGH (ref 70–99)
Potassium: 3.4 mmol/L — ABNORMAL LOW (ref 3.5–5.1)
Sodium: 133 mmol/L — ABNORMAL LOW (ref 135–145)
Total Bilirubin: 0.6 mg/dL (ref 0.3–1.2)
Total Protein: 5.3 g/dL — ABNORMAL LOW (ref 6.5–8.1)

## 2018-11-03 LAB — CBC WITH DIFFERENTIAL/PLATELET
Abs Immature Granulocytes: 0.17 K/uL — ABNORMAL HIGH (ref 0.00–0.07)
Basophils Absolute: 0 K/uL (ref 0.0–0.1)
Basophils Relative: 0 %
Eosinophils Absolute: 0.1 K/uL (ref 0.0–0.5)
Eosinophils Relative: 1 %
HCT: 20.6 % — ABNORMAL LOW (ref 39.0–52.0)
Hemoglobin: 6.4 g/dL — CL (ref 13.0–17.0)
Immature Granulocytes: 2 %
Lymphocytes Relative: 6 %
Lymphs Abs: 0.6 K/uL — ABNORMAL LOW (ref 0.7–4.0)
MCH: 25.5 pg — ABNORMAL LOW (ref 26.0–34.0)
MCHC: 31.1 g/dL (ref 30.0–36.0)
MCV: 82.1 fL (ref 80.0–100.0)
Monocytes Absolute: 0.6 K/uL (ref 0.1–1.0)
Monocytes Relative: 6 %
Neutro Abs: 8.9 K/uL — ABNORMAL HIGH (ref 1.7–7.7)
Neutrophils Relative %: 85 %
Platelets: 196 K/uL (ref 150–400)
RBC: 2.51 MIL/uL — ABNORMAL LOW (ref 4.22–5.81)
RDW: 16.2 % — ABNORMAL HIGH (ref 11.5–15.5)
WBC: 10.4 K/uL (ref 4.0–10.5)
nRBC: 0.2 % (ref 0.0–0.2)

## 2018-11-03 LAB — CBC
HCT: 23 % — ABNORMAL LOW (ref 39.0–52.0)
Hemoglobin: 7.2 g/dL — ABNORMAL LOW (ref 13.0–17.0)
MCH: 26 pg (ref 26.0–34.0)
MCHC: 31.3 g/dL (ref 30.0–36.0)
MCV: 83 fL (ref 80.0–100.0)
Platelets: 223 K/uL (ref 150–400)
RBC: 2.77 MIL/uL — ABNORMAL LOW (ref 4.22–5.81)
RDW: 16.5 % — ABNORMAL HIGH (ref 11.5–15.5)
WBC: 12.4 K/uL — ABNORMAL HIGH (ref 4.0–10.5)
nRBC: 0.2 % (ref 0.0–0.2)

## 2018-11-03 LAB — PREPARE RBC (CROSSMATCH)

## 2018-11-03 LAB — GLUCOSE, CAPILLARY
Glucose-Capillary: 133 mg/dL — ABNORMAL HIGH (ref 70–99)
Glucose-Capillary: 162 mg/dL — ABNORMAL HIGH (ref 70–99)
Glucose-Capillary: 158 mg/dL — ABNORMAL HIGH (ref 70–99)
Glucose-Capillary: 162 mg/dL — ABNORMAL HIGH (ref 70–99)

## 2018-11-03 LAB — LIPASE, BLOOD: Lipase: 21 U/L (ref 11–51)

## 2018-11-03 MED ORDER — SODIUM CHLORIDE 0.9% IV SOLUTION
Freq: Once | INTRAVENOUS | Status: DC
Start: 2018-11-03 — End: 2018-11-06

## 2018-11-03 MED ORDER — POTASSIUM CHLORIDE CRYS ER 20 MEQ PO TBCR
40.0000 meq | Freq: Two times a day (BID) | ORAL | Status: AC
Start: 2018-11-03 — End: 2018-11-03
  Administered 2018-11-03 (×2): 40 meq via ORAL
  Filled 2018-11-03 (×2): qty 2

## 2018-11-03 MED ORDER — HYDROCOD POLST-CPM POLST ER 10-8 MG/5ML PO SUER
5.0000 mL | Freq: Once | ORAL | Status: AC
Start: 2018-11-03 — End: 2018-11-03
  Administered 2018-11-03: 5 mL via ORAL
  Filled 2018-11-03: qty 5

## 2018-11-03 NOTE — Progress Notes (Signed)
Subjective/Chief Complaint: Had chills yesterday, better with ativan   Objective: Vital signs in last 24 hours: Temp:  [97.8 F (36.6 C)-99.5 F (37.5 C)] 98.9 F (37.2 C) (07/06 0847) Pulse Rate:  [92-123] 93 (07/06 0847) Resp:  [18-31] 22 (07/06 0847) BP: (140-193)/(63-137) 141/63 (07/06 0847) SpO2:  [92 %-97 %] 97 % (07/06 0847) Last BM Date: 10/31/18  Intake/Output from previous day: 07/05 0701 - 07/06 0700 In: 708 [P.O.:600; IV Piggyback:98] Out: 720 [Urine:650; Drains:70] Intake/Output this shift: No intake/output data recorded.  Exam: Awake and alert Abdomen soft, minimally tender, drain looks like old hematoma, fluid in tubing is thin ss  Lab Results:  Recent Labs    11/02/18 1542 11/03/18 0323  WBC 15.4* 12.4*  HGB 7.5* 7.2*  HCT 24.0* 23.0*  PLT 226 223   BMET Recent Labs    11/02/18 0252 11/03/18 0323  NA 133* 133*  K 3.8 3.4*  CL 102 98  CO2 21* 22  GLUCOSE 132* 132*  BUN 12 12  CREATININE 1.24 1.31*  CALCIUM 8.6* 8.6*   PT/INR No results for input(s): LABPROT, INR in the last 72 hours. ABG No results for input(s): PHART, HCO3 in the last 72 hours.  Invalid input(s): PCO2, PO2  Studies/Results: Ct Image Guided Drainage By Percutaneous Catheter  Result Date: 11/01/2018 INDICATION: Cholecystectomy 3 weeks ago, gallbladder fossa fluid collection EXAM: CT DRAIN GALLBLADDER FOSSA ABSCESS MEDICATIONS: The patient is currently admitted to the hospital and receiving intravenous antibiotics. The antibiotics were administered within an appropriate time frame prior to the initiation of the procedure. ANESTHESIA/SEDATION: Fentanyl 100 mcg IV; Versed 2.0 mg IV Moderate Sedation Time:  13 minutes The patient was continuously monitored during the procedure by the interventional radiology nurse under my direct supervision. COMPLICATIONS: None immediate. PROCEDURE: Informed written consent was obtained from the patient after a thorough discussion of the  procedural risks, benefits and alternatives. All questions were addressed. Maximal Sterile Barrier Technique was utilized including caps, mask, sterile gowns, sterile gloves, sterile drape, hand hygiene and skin antiseptic. A timeout was performed prior to the initiation of the procedure. Previous imaging reviewed. Patient positioned supine. Noncontrast localization CT performed. The large complex heterogeneous air-fluid collection in the gallbladder fossa was localized. Overlying skin marked for a lateral intercostal approach. Under sterile conditions and local anesthesia, an 18 gauge 10 cm access needle was advanced into the collection. Syringe aspiration yielded dark malodorous thick blood compatible with infected hematoma. Guidewire advanced followed by tract dilatation to insert a 14 [place] drain. Drain catheter position confirmed with CT. Sample sent for culture. Catheter secured with Prolene suture and connected to external suction bulb. Sterile dressing applied. No immediate complication. Patient tolerated the procedure well. IMPRESSION: Successful CT-guided gallbladder fossa abscess drain placement. Electronically Signed   By: Judie PetitM.  Shick M.D.   On: 11/01/2018 12:09    Anti-infectives: Anti-infectives (From admission, onward)   Start     Dose/Rate Route Frequency Ordered Stop   11/01/18 0600  ceFEPIme (MAXIPIME) 1 g in sodium chloride 0.9 % 100 mL IVPB  Status:  Discontinued     1 g 200 mL/hr over 30 Minutes Intravenous Every 8 hours 10/31/18 1957 11/01/18 0256   11/01/18 0400  piperacillin-tazobactam (ZOSYN) IVPB 3.375 g     3.375 g 12.5 mL/hr over 240 Minutes Intravenous Every 8 hours 11/01/18 0258     10/31/18 1944  ceFEPIme (MAXIPIME) 2 g injection    Note to Pharmacy: Bartholomew Crewsavis, Jennaya   : cabinet override  10/31/18 1944 11/01/18 0759   10/31/18 1915  ceFEPIme (MAXIPIME) 2 g in sodium chloride 0.9 % 100 mL IVPB     2 g 200 mL/hr over 30 Minutes Intravenous  Once 10/31/18 1911 10/31/18  2052   10/31/18 1915  metroNIDAZOLE (FLAGYL) IVPB 500 mg     500 mg 100 mL/hr over 60 Minutes Intravenous  Once 10/31/18 1911 10/31/18 2100      Assessment/Plan:  Gallbladder fossa abscess  S/p IR drain that looks like old infected hematoma. Continue to monitor for signs of bile leak. Awaiting cultures Ok to advance po from surgery standpoint, ok to progress toward discharge when medically ready   LOS: 3 days    Clovis Riley 11/03/2018

## 2018-11-03 NOTE — Progress Notes (Signed)
PROGRESS NOTE    [name]  ZOX:[id number] DOB: 1942-08-31 DOA: 10/31/2018 PCP: Karle PlumberArvind, Moogali M, MD   Brief Narrative:  [name] a 73 y.o.malewith medical history significant ofCAD status post CABG x4, type 2 diabetes, hypertension, hyperlipidemia,diverticulitis, liver abscess, cholecystitis status post percutaneous cholecystostomy tube 1/12 and eventually lap chole on 6/9 presented to [facility] with complaints of abdominal pain.Patient states he has not been doing well since his recent surgery and has continued to have abdominal pain which is getting progressively worse. The pain is located in his right mid abdomen. He feels nauseous but has not vomited. He is having diarrhea. He has not been eating much. Denies any fevers, chills, chest pain, shortness of breath, or cough. Denies exposure to any individual with known COVID-19. In WJ:[id number]:Temperature 102.4 F.Hemodynamically stable. White count 24.6 with left shift. Lactic acid 3.6 >2.8with IV fluid. Hemoglobin 10.0, baseline ranging from 8-11. Creatinine 1.5, baseline 1.1-1.3. Lipase and LFTs normal. UA not suggestive of infection. CT abdomen pelvis showing a 7 x 9.9 x 9.4 cm collection of fluid, debris, and gas centered in the gallbladder fossa with significant adjacent fat stranding. Findings consistent with a postoperative abscess.The abscess abuts the proximal duodenum resulting in secondary wall thickening/inflammation.A small amount of debris and gas may extend anteriorly along the liver extending towards the dome. ED provider discussed the case with general surgery Dr. Magnus IvanBlackman who recommended admission under hospitalist service.Patient received cefepime, metronidazole, and 1.5 L fluid boluses in the ED. he was continued on Zosyn.  Patient underwent Percutaneous drainage with IR on 11/01/2018 and has gallbladder fossa abscess drained now.  Consultants:   General surgery  Procedures:    Percutaneous drainage with IR on 11/01/2018  Antimicrobials:   Received a dose of cefepime and metronidazole on 10/31/2018.  Started on Zosyn on 10/31/2018   Subjective: Patient seen and examined.  He states that he feels much better than how he felt last night.  His abdominal pain is 3 out of 10.  No other complaint.  He remains afebrile.  Objective: Vitals:   11/02/18 2008 11/03/18 0002 11/03/18 0426 11/03/18 0847  BP: (!) 151/69 (!) 141/70 (!) 149/72 (!) 141/63  Pulse: 92 95 96 93  Resp: 19 18 (!) 22 (!) 22  Temp: 99.4 F (37.4 C) 99.5 F (37.5 C) 98.9 F (37.2 C) 98.9 F (37.2 C)  TempSrc: Axillary Oral Oral Oral  SpO2: 95% 96% 97% 97%  Weight:      Height:        Intake/Output Summary (Last 24 hours) at 11/03/2018 0912 Last data filed at 11/03/2018 0500 Gross per 24 hour  Intake 468 ml  Output 720 ml  Net -252 ml   Filed Weights   10/31/18 1720 11/01/18 0140  Weight: 105.7 kg 106.3 kg    Examination:  General exam: Appears calm and comfortable  Respiratory system: Clear to auscultation. Respiratory effort normal. Cardiovascular system: S1 & S2 heard, RRR. No JVD, murmurs, rubs, gallops or clicks. No pedal edema. Gastrointestinal system: Abdomen is nondistended, soft and minimally tender at right upper quadrant, no organomegaly or masses felt. Normal bowel sounds heard.  Has gallbladder fossa drain in the right upper quadrant which has blood and blood clots in it. Central nervous system: Alert and oriented. No focal neurological deficits. Extremities: Symmetric 5 x 5 power. Skin: No rashes, lesions or ulcers Psychiatry: Judgement and insight appear normal. Mood & affect appropriate.    Data Reviewed: I have personally  reviewed following labs and imaging studies  CBC: Recent Labs  Lab 10/31/18 1836 11/01/18 0315 11/02/18 0252 11/02/18 1223 11/02/18 1542 11/03/18 0323  WBC 24.6* 15.2* 9.3 9.2 15.4* 12.4*  NEUTROABS 21.5*  --   --  8.5* 12.6*  --   HGB  10.0* 8.1* 7.4* 7.6* 7.5* 7.2*  HCT 32.5* 25.8* 23.4* 24.3* 24.0* 23.0*  MCV 84.6 83.8 83.0 84.4 83.0 83.0  PLT 430* 262 243 220 226 223   Basic Metabolic Panel: Recent Labs  Lab 10/31/18 1836 11/02/18 0252 11/03/18 0323  NA 133* 133* 133*  K 5.1 3.8 3.4*  CL 97* 102 98  CO2 20* 21* 22  GLUCOSE 197* 132* 132*  BUN 21 12 12   CREATININE 1.53* 1.24 1.31*  CALCIUM 9.5 8.6* 8.6*   GFR: Estimated Creatinine Clearance: 59.5 mL/min (A) (by C-G formula based on SCr of 1.31 mg/dL (H)). Liver Function Tests: Recent Labs  Lab 10/31/18 1836 11/02/18 0252 11/03/18 0323  AST 20 14* 22  ALT 18 15 22   ALKPHOS 62 51 64  BILITOT 0.6 0.8 0.6  PROT 7.1 5.5* 5.3*  ALBUMIN 3.6 2.4* 2.3*   Recent Labs  Lab 10/31/18 1836 11/02/18 0252 11/03/18 0323  LIPASE 26 24 21    No results for input(s): AMMONIA in the last 168 hours. Coagulation Profile: No results for input(s): INR, PROTIME in the last 168 hours. Cardiac Enzymes: No results for input(s): CKTOTAL, CKMB, CKMBINDEX, TROPONINI in the last 168 hours. BNP (last 3 results) No results for input(s): PROBNP in the last 8760 hours. HbA1C: Recent Labs    11/01/18 0315  HGBA1C 6.0*   CBG: Recent Labs  Lab 11/02/18 0613 11/02/18 1126 11/02/18 1615 11/02/18 2134 11/03/18 0612  GLUCAP 135* 141* 133* 181* 133*   Lipid Profile: No results for input(s): CHOL, HDL, LDLCALC, TRIG, CHOLHDL, LDLDIRECT in the last 72 hours. Thyroid Function Tests: No results for input(s): TSH, T4TOTAL, FREET4, T3FREE, THYROIDAB in the last 72 hours. Anemia Panel: No results for input(s): VITAMINB12, FOLATE, FERRITIN, TIBC, IRON, RETICCTPCT in the last 72 hours. Sepsis Labs: Recent Labs  Lab 10/31/18 1939 10/31/18 2146 11/01/18 0516  LATICACIDVEN 3.6* 2.8* 1.5    Recent Results (from the past 240 hour(s))  Blood culture (routine x 2)     Status: None (Preliminary result)   Collection Time: 10/31/18  7:40 PM   Specimen: BLOOD  Result Value Ref  Range Status   Specimen Description   Final    BLOOD LEFT HAND Performed at [facility], [address] [place], [place] [address]    Special Requests   Final    BOTTLES DRAWN AEROBIC ONLY Blood Culture results may not be optimal due to an inadequate volume of blood received in culture bottles Performed at [facility], [address] [place], [place] [address]    Culture   Final    NO GROWTH 2 DAYS Performed at [facility], [address] [place], [place] [address]    Report Status PENDING  Incomplete  SARS Coronavirus 2 (Hosp order,Performed in [facility] lab via [employer] ID)     Status: None   Collection Time: 10/31/18  8:52 PM   Specimen: Dry Nasal Swab (Abbott ID Now)  Result Value Ref Range Status   SARS Coronavirus 2 (Abbott ID Now) NEGATIVE NEGATIVE Final    Comment: (NOTE) SARS-CoV-2 target nucleic acids are NOT DETECTED. The SARS-CoV-2 RNA is generally detectable in upper and lower respiratory specimens  during the acute phase of infection.  Negativeresults do not preclude SARS-CoV-2 infection, do not rule out coinfections with other pathogens, and should not be used as the  sole basis for treatment or other patient management decisions.  Negative results must be combined with clinical observations, patient history, and epidemiological information. The expected result is Negative. Fact Sheet for Patients: GolfingFamily.no Fact Sheet for Healthcare Providers: https://www.hernandez-brewer.com/ This test is not yet approved or cleared by the [place] FDA and  has been authorized for detection and/or diagnosis of SARS-CoV-2 by FDA under an Emergency Use Authorization (EUA).  This EUA will remain in effect (meaning this test can be used) for the duration of  the COVID19 declaration under Section 5 64(b)(1) of the Act, 21 U.S.C.  section [phone] 3(b)(1), unless the authorization is terminated or revoked  sooner. Performed at [facility], [address] [place], [place] [address]   Aerobic/Anaerobic Culture (surgical/deep wound)     Status: None (Preliminary result)   Collection Time: 11/01/18 11:33 AM   Specimen: Abdomen; Abscess  Result Value Ref Range Status   Specimen Description ABDOMEN  Final   Special Requests Normal  Final   Gram Stain   Final    MODERATE WBC PRESENT, PREDOMINANTLY PMN MODERATE GRAM NEGATIVE RODS FEW GRAM POSITIVE COCCI IN PAIRS Performed at [facility], [address] [place], [place] [address]    Culture   Final    ABUNDANT KLEBSIELLA PNEUMONIAE ABUNDANT ENTEROCOCCUS FAECALIS    Report Status PENDING  Incomplete      Radiology Studies: Ct Image Guided Drainage By Percutaneous Catheter  Result Date: 11/01/2018 INDICATION: Cholecystectomy 3 weeks ago, gallbladder fossa fluid collection EXAM: CT DRAIN GALLBLADDER FOSSA ABSCESS MEDICATIONS: The patient is currently admitted to the hospital and receiving intravenous antibiotics. The antibiotics were administered within an appropriate time frame prior to the initiation of the procedure. ANESTHESIA/SEDATION: Fentanyl 100 mcg IV; Versed 2.0 mg IV Moderate Sedation Time:  13 minutes The patient was continuously monitored during the procedure by the interventional radiology nurse under my direct supervision. COMPLICATIONS: None immediate. PROCEDURE: Informed written consent was obtained from the patient after a thorough discussion of the procedural risks, benefits and alternatives. All questions were addressed. Maximal Sterile Barrier Technique was utilized including caps, mask, sterile gowns, sterile gloves, sterile drape, hand hygiene and skin antiseptic. A timeout was performed prior to the initiation of the procedure. Previous imaging reviewed. Patient positioned supine. Noncontrast localization CT performed. The large complex heterogeneous air-fluid collection in the gallbladder fossa was localized.  Overlying skin marked for a lateral intercostal approach. Under sterile conditions and local anesthesia, an 18 gauge 10 cm access needle was advanced into the collection. Syringe aspiration yielded dark malodorous thick blood compatible with infected hematoma. Guidewire advanced followed by tract dilatation to insert a 14 [place] drain. Drain catheter position confirmed with CT. Sample sent for culture. Catheter secured with Prolene suture and connected to external suction bulb. Sterile dressing applied. No immediate complication. Patient tolerated the procedure well. IMPRESSION: Successful CT-guided gallbladder fossa abscess drain placement. Electronically Signed   By: Jerilynn Mages.  Shick M.D.   On: 11/01/2018 12:09    Scheduled Meds:  insulin aspart  0-9 Units Subcutaneous TID WC   potassium chloride  40 mEq Oral BID   sodium chloride flush  5 mL Intracatheter Q8H   Continuous Infusions:  piperacillin-tazobactam (ZOSYN)  IV 3.375 g (11/03/18 0754)     LOS: 3 days   Assessment &  Plan:   Principal Problem:   Abscess, intra-abdominal, postoperative Active Problems:   Hx of CABG   Coronary artery disease   Non-insulin dependent type 2 diabetes mellitus (HCC)   AKI (acute kidney injury) (HCC)   RBBB   Sepsis secondary to gallbladder fossa abscess due to postoperative complication present on admission: He remains afebrile for at least 48 hours.  Leukocytosis improving.  Status post abscess drainage and drain placement at right upper quadrant gallbladder fossa by IR on 11/01/2018.  General surgery following.  He is on full liquids.  Continue Zosyn.  Culture negative so far.  Will defer management to general surgery.  Eliquis on hold for now.  Will resume once cleared by general surgery.  Anion gap metabolic acidosis in the setting of lactic acidosis, present on admission: Resolved.  Continue gentle IV hydration.  Acute on chronic anemia: His hemoglobin at baseline is around 10.  Currently 7.2.  No  active signs of bleeding however his drainage in the bulb is bloody which could be the source of his blood hemoglobin drop.  We will recheck his hemoglobin later today and transfuse if drops less than 7.  CAD status post CABG x4 in December 2019, new RBBB: EKG shows right bundle branch block which appears to be new compared to prior tracing earlier this year.  No signs of ACS.  Continue monitoring.  Troponin negative at admission.  Well-controlled type 2 diabetes mellitus: Lab hemoglobin A1c 6.0.  Blood sugar control.  Continue SSI.  AKI: Resolved.  DVT prophylaxis: SCD Code Status: Full code Family Communication: No family member present at bedside.  Plan discussed with patient in detail. Disposition Plan: To be determined based on clinical progress.   Time spent: 28 minutes   Hughie Clossavi Emmalia Heyboer, MD [facility] Pager [phone]  If 7PM-7AM, please contact night-coverage [email or web address] Password [email or web address] 11/03/2018, 9:12 AM

## 2018-11-03 NOTE — Progress Notes (Signed)
Dr. Hilbert Bible paged re: pt has a hacking moist cough started today and was requesting something for it.

## 2018-11-03 NOTE — Progress Notes (Signed)
Referring Physician(s): Dr Barrie Dunker Blackman  Supervising Physician: Richarda OverlieHenn, [name]  Patient Status:  [facility]  Chief Complaint:  GB fossa fluid collection Post op 3 weeks ago Drain placed in IR 7/4  Subjective:  Feeling better Had and episode yesterday of chills/sahking 7/5 at noon Ativan-- resolved symptoms  None since Restful today   Allergies: Ciprofloxacin and Erythromycin ethylsuccinate  Medications: Prior to Admission medications   Medication Sig Start Date End Date Taking? Authorizing Provider  acetaminophen (TYLENOL) 500 MG tablet Take 1,000 mg by mouth daily as needed for moderate pain.   Yes [provider]  amiodarone (PACERONE) 200 MG tablet TAKE 1 TABLET BY MOUTH DAILY Patient taking differently: Take 200 mg by mouth daily.  07/04/18  Yes Runell GessBerry, Jonathan J, MD  amLODipine (NORVASC) 5 MG tablet Take 5 mg by mouth daily. 02/05/18  Yes [provider]  aspirin EC 81 MG tablet Take 1 tablet (81 mg total) by mouth daily. Patient taking differently: Take 81 mg by mouth every evening.  05/01/18  Yes Kilroy, Eda PaschalLuke K, PA-C  Cholecalciferol (VITAMIN D3) 2000 units TABS Take 2,000 Units by mouth 2 (two) times daily.    Yes [provider]  docusate sodium (COLACE) 100 MG capsule Take 100 mg by mouth 2 (two) times daily.    Yes [provider]  ELIQUIS 5 MG TABS tablet TAKE ONE TABLET BY MOUTH TWICE DAILY 06/03/18  Yes Runell GessBerry, Jonathan J, MD  finasteride (PROSCAR) 5 MG tablet Take 5 mg by mouth daily. 02/05/18  Yes [provider]  loratadine (CLARITIN) 10 MG tablet Take 10 mg by mouth daily.   Yes [provider]  metFORMIN (GLUCOPHAGE) 1000 MG tablet Take 1,000 mg by mouth 2 (two) times daily. 02/05/18  Yes [provider]  metoprolol tartrate (LOPRESSOR) 25 MG tablet Take 1 tablet (25 mg total) by mouth 2 (two) times daily. 05/20/18  Yes Runell GessBerry, Jonathan J, MD  Multiple Vitamins-Minerals (MULTIVITAMIN ADULT PO) Take 1  tablet by mouth daily.   Yes [provider]  oxyCODONE (OXY IR/ROXICODONE) 5 MG immediate release tablet Take 1 tablet (5 mg total) by mouth every 6 (six) hours as needed for severe pain. Patient not taking: Reported on 11/01/2018 10/07/18   Almond LintByerly, Faera, MD     Vital Signs: BP (!) 170/74 (BP Location: Left Arm)    Pulse 94    Temp 98.3 F (36.8 C) (Oral)    Resp (!) 25    Ht 5\' 11"  (1.803 m)    Wt 234 lb 4.8 oz (106.3 kg)    SpO2 98%    BMI 32.68 kg/m   Physical Exam Vitals signs reviewed.  Skin:    General: Skin is warm and dry.     Comments: Site clean and dry [place] OP dark bloody fluid 25 cc in JP 70 cc yesterday  ABUNDANT KLEBSIELLA PNEUMONIAE  ABUNDANT ENTEROCOCCUS FAECALIS   Report Status PENDING  Organism ID, Bacteria ENTEROCOCCUS FAECALIS  Organism ID, Bacteria KLEBSIELLA PNEUMONIAE     Neurological:     Mental Status: He is alert.     Imaging: Ct Abdomen Pelvis W Contrast  Result Date: 10/31/2018 CLINICAL DATA:  Gallbladder surgery 3 weeks ago. History of liver abscess. Fever. EXAM: CT ABDOMEN AND PELVIS WITH CONTRAST TECHNIQUE: Multidetector CT imaging of the abdomen and pelvis was performed using the standard protocol following bolus administration of intravenous contrast. CONTRAST:  80mL OMNIPAQUE IOHEXOL 300 MG/ML  SOLN COMPARISON:  MRI of  the abdomen July 01, 2018. CT scan of the abdomen and pelvis June 11, 2018. FINDINGS: Lower chest: No suspicious nodules, masses, or infiltrates in the lung bases. Lower chest is unremarkable. Hepatobiliary: Patient is status post cholecystectomy. There is a collection of debris, fluid, and gas centered in the gallbladder fossa. There is an oval region of high attenuation within the fossa, likely blood products from recent surgery. The fluid tracks superiorly along the anterior margin of the liver. Some low attenuation seen anterior to the liver on axial image 14 may represent some debris tracks superiorly. On image number  11, there is some high attenuation anterior to the liver which could represent a tiny amount of air tracking from the gallbladder fossa. The collection abuts the first and second portions of the duodenum which is thickened and likely secondarily inflamed. The greatest dimension of the abnormality in the gallbladder fossa is 7 by 9.9 by 9.4 cm. The liver parenchyma itself is normal in appearance. The portal vein is patent. Pancreas: Unremarkable. No pancreatic ductal dilatation or surrounding inflammatory changes. Spleen: Normal in size without focal abnormality. 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 Adrenal glands are otherwise normal. There are at least 3 cysts in the left kidney. No hydronephrosis or stones. No evidence of obstruction. No ureteral stones. The bladder is normal. Stomach/Bowel: The stomach is normal. There is thickening of the proximal duodenum due to the abscess in the gallbladder fossa resulting in secondary inflammation. The abscess likely arises from the recent surgery. The small bowel is otherwise normal. Colonic diverticulosis is seen without diverticulitis. The appendix is normal. Vascular/Lymphatic: Atherosclerotic changes are seen in the nonaneurysmal aorta. No adenopathy. Reproductive: Prostate is unremarkable. Other: No abdominal wall hernia or abnormality. No abdominopelvic ascites. Musculoskeletal: No acute or significant osseous findings. IMPRESSION: 1. There is a 7 x 9.9 x 9.4 cm collection of fluid, debris, and gas centered in the gallbladder fossa with significant adjacent fat stranding. This finding is consistent with a postoperative abscess. The abscess abuts the proximal duodenum resulting in secondary wall thickening/inflammation. A small amount of debris and gas may extend anteriorly along the liver extending towards the dome. 2. Diverticulosis without diverticulitis. 3. Atherosclerotic changes in the nonaneurysmal aorta. Electronically  Signed   By: Gerome Samavid  Williams III M.D   On: 10/31/2018 21:26   Ct Image Guided Drainage By Percutaneous Catheter  Result Date: 11/01/2018 INDICATION: Cholecystectomy 3 weeks ago, gallbladder fossa fluid collection EXAM: CT DRAIN GALLBLADDER FOSSA ABSCESS MEDICATIONS: The patient is currently admitted to the hospital and receiving intravenous antibiotics. The antibiotics were administered within an appropriate time frame prior to the initiation of the procedure. ANESTHESIA/SEDATION: Fentanyl 100 mcg IV; Versed 2.0 mg IV Moderate Sedation Time:  13 minutes The patient was continuously monitored during the procedure by the interventional radiology nurse under my direct supervision. COMPLICATIONS: None immediate. PROCEDURE: Informed written consent was obtained from the patient after a thorough discussion of the procedural risks, benefits and alternatives. All questions were addressed. Maximal Sterile Barrier Technique was utilized including caps, mask, sterile gowns, sterile gloves, sterile drape, hand hygiene and skin antiseptic. A timeout was performed prior to the initiation of the procedure. Previous imaging reviewed. Patient positioned supine. Noncontrast localization CT performed. The large complex heterogeneous air-fluid collection in the gallbladder fossa was localized. Overlying skin marked for a lateral intercostal approach. Under sterile conditions and local anesthesia, an 18 gauge 10 cm access needle was advanced into the collection. Syringe aspiration yielded  dark malodorous thick blood compatible with infected hematoma. Guidewire advanced followed by tract dilatation to insert a 14 [place] drain. Drain catheter position confirmed with CT. Sample sent for culture. Catheter secured with Prolene suture and connected to external suction bulb. Sterile dressing applied. No immediate complication. Patient tolerated the procedure well. IMPRESSION: Successful CT-guided gallbladder fossa abscess drain placement.  Electronically Signed   By: Jerilynn Mages.  Shick M.D.   On: 11/01/2018 12:09    Labs:  CBC: Recent Labs    11/02/18 0252 11/02/18 1223 11/02/18 1542 11/03/18 0323  WBC 9.3 9.2 15.4* 12.4*  HGB 7.4* 7.6* 7.5* 7.2*  HCT 23.4* 24.3* 24.0* 23.0*  PLT 243 220 226 223    COAGS: Recent Labs    03/14/18 1610 03/26/18 1146 03/31/18 1458 04/08/18 0238 05/10/18 0907 10/06/18 0903  INR 0.96 0.96 1.37 1.06 1.42 0.9  APTT 32 31 29  --   --   --     BMP: Recent Labs    10/06/18 0903 10/31/18 1836 11/02/18 0252 11/03/18 0323  NA 136 133* 133* 133*  K 4.7 5.1 3.8 3.4*  CL 101 97* 102 98  CO2 21* 20* 21* 22  GLUCOSE 122* 197* 132* 132*  BUN 18 21 12 12   CALCIUM 9.5 9.5 8.6* 8.6*  CREATININE 1.31* 1.53* 1.24 1.31*  GFRNONAA 53* 44* 56* 53*  GFRAA >60 50* >60 >60    LIVER FUNCTION TESTS: Recent Labs    10/06/18 0903 10/31/18 1836 11/02/18 0252 11/03/18 0323  BILITOT 0.7 0.6 0.8 0.6  AST 32 20 14* 22  ALT 25 18 15 22   ALKPHOS 51 62 51 64  PROT 6.6 7.1 5.5* 5.3*  ALBUMIN 3.7 3.6 2.4* 2.3*    Assessment and Plan:  GB fossa collection drain placed 7/4 OP bloody Will follow Flushing easily  Electronically Signed: Lavonia Drafts, PA-C 11/03/2018, 2:19 PM   I spent a total of 15 Minutes at the the patient's bedside AND on the patient's hospital floor or unit, greater than 50% of which was counseling/coordinating care for GB fossa collection drain

## 2018-11-04 LAB — COMPREHENSIVE METABOLIC PANEL
ALT: 18 U/L (ref 0–44)
AST: 19 U/L (ref 15–41)
Albumin: 2.3 g/dL — ABNORMAL LOW (ref 3.5–5.0)
Alkaline Phosphatase: 69 U/L (ref 38–126)
Anion gap: 11 (ref 5–15)
BUN: 8 mg/dL (ref 8–23)
CO2: 22 mmol/L (ref 22–32)
Calcium: 8.8 mg/dL — ABNORMAL LOW (ref 8.9–10.3)
Chloride: 98 mmol/L (ref 98–111)
Creatinine, Ser: 1.11 mg/dL (ref 0.61–1.24)
GFR calc Af Amer: >60 mL/min (ref >60)
GFR calc non Af Amer: >60 mL/min (ref >60)
Glucose, Bld: 151 mg/dL — ABNORMAL HIGH (ref 70–99)
Potassium: 3.9 mmol/L (ref 3.5–5.1)
Sodium: 131 mmol/L — ABNORMAL LOW (ref 135–145)
Total Bilirubin: 0.6 mg/dL (ref 0.3–1.2)
Total Protein: 5.4 g/dL — ABNORMAL LOW (ref 6.5–8.1)

## 2018-11-04 LAB — BPAM RBC
Blood Product Expiration Date: 202008062359
ISSUE DATE / TIME: 202007062107
Unit Type and Rh: 5100

## 2018-11-04 LAB — CBC
HCT: 24.6 % — ABNORMAL LOW (ref 39.0–52.0)
Hemoglobin: 8 g/dL — ABNORMAL LOW (ref 13.0–17.0)
MCH: 27 pg (ref 26.0–34.0)
MCHC: 32.5 g/dL (ref 30.0–36.0)
MCV: 83.1 fL (ref 80.0–100.0)
Platelets: 234 K/uL (ref 150–400)
RBC: 2.96 MIL/uL — ABNORMAL LOW (ref 4.22–5.81)
RDW: 16.4 % — ABNORMAL HIGH (ref 11.5–15.5)
WBC: 12.2 K/uL — ABNORMAL HIGH (ref 4.0–10.5)
nRBC: 0 % (ref 0.0–0.2)

## 2018-11-04 LAB — GLUCOSE, CAPILLARY
Glucose-Capillary: 153 mg/dL — ABNORMAL HIGH (ref 70–99)
Glucose-Capillary: 168 mg/dL — ABNORMAL HIGH (ref 70–99)
Glucose-Capillary: 185 mg/dL — ABNORMAL HIGH (ref 70–99)
Glucose-Capillary: 145 mg/dL — ABNORMAL HIGH (ref 70–99)

## 2018-11-04 LAB — TYPE AND SCREEN
ABO/RH(D): O POS
Antibody Screen: NEGATIVE
Unit division: 0

## 2018-11-04 LAB — LIPASE, BLOOD: Lipase: 19 U/L (ref 11–51)

## 2018-11-04 NOTE — Progress Notes (Signed)
Physical Therapy Evaluation Patient Details Name: Calvin FantasiaLarry F Price MRN: [id number] DOB: Oct 20, 1942 Today's Date: 11/04/2018   History of Present Illness  [name] is a 73 y.o. male with medical history significant of CAD status post CABG x4, type 2 diabetes, hypertension, hyperlipidemia, diverticulitis, liver abscess, cholecystitis status post percutaneous cholecystostomy tube 1/12 and eventually lap chole on 6/9 presented [facility] with complaints of abdominal pain..  S/P drain placement for old hematoma.  Clinical Impression  Pt admitted with/for abdominal pain s/p drain placement.  Pt is weak and deconditioned, but though not baseline, moves at min guard or better level..  Pt currently limited functionally due to the problems listed below.  (see problems list.)  Pt will benefit from PT to maximize function and safety to be able to get home safely with available assist.     Follow Up Recommendations Home health PT;Other (comment)(working toward CRP2)    Equipment Recommendations  None recommended by PT    Recommendations for Other Services       Precautions / Restrictions        Mobility  Bed Mobility Overal bed mobility: Modified Independent                Transfers Overall transfer level: Needs assistance   Transfers: Sit to/from Stand Sit to Stand: Supervision            Ambulation/Gait Ambulation/Gait assistance: Min guard Gait Distance (Feet): 140 Feet Assistive device: IV Pole Gait Pattern/deviations: Step-through pattern   Gait velocity interpretation: 1.31 - 2.62 ft/sec, indicative of limited community ambulator General Gait Details: generally steady holding to the iv pole, but noticeably guarded, mildly dyspneic with sats at 91 % and EHR 93 bpm/.  Stairs            Wheelchair Mobility    Modified Rankin (Stroke Patients Only)       Balance                                             Pertinent  Vitals/Pain Pain Assessment: Faces Faces Pain Scale: Hurts a little bit Pain Location: drain site Pain Descriptors / Indicators: Discomfort Pain Intervention(s): Monitored during session    Home Living Family/patient expects to be discharged to:: Private residence Living Arrangements: Alone Available Help at Discharge: Family;Available PRN/intermittently Type of Home: Other(Comment) Home Access: Stairs to enter Entrance Stairs-Rails: Right Entrance Stairs-Number of Steps: 2 Home Layout: Laundry or work area in basement;Able to live on main level with bedroom/bathroom Home Equipment: Environmental consultantWalker - 2 wheels;Cane - single point;Wheelchair - manual      Prior Function Level of Independence: Independent         Comments: enjoys Therapist, musicgolfing     Hand Dominance   Dominant Hand: Right    Extremity/Trunk Assessment   Upper Extremity Assessment Upper Extremity Assessment: Overall WFL for tasks assessed    Lower Extremity Assessment Lower Extremity Assessment: Overall WFL for tasks assessed(general proximal weaknesses)       Communication   Communication: No difficulties  Cognition Arousal/Alertness: Awake/alert Behavior During Therapy: WFL for tasks assessed/performed Overall Cognitive Status: Within Functional Limits for tasks assessed  General Comments      Exercises     Assessment/Plan    PT Assessment Patient needs continued PT services  PT Problem List Decreased strength;Decreased activity tolerance;Decreased mobility;Decreased knowledge of use of DME;Cardiopulmonary status limiting activity       PT Treatment Interventions DME instruction;Gait training;Stair training;Functional mobility training;Therapeutic activities;Patient/family education    PT Goals (Current goals can be found in the Care Plan section)  Acute Rehab PT Goals Patient Stated Goal: back to playing golf. PT Goal Formulation: With patient Time  For Goal Achievement: 11/18/18 Potential to Achieve Goals: Good    Frequency Min 3X/week   Barriers to discharge        Co-evaluation               AM-PAC PT "6 Clicks" Mobility  Outcome Measure Help needed turning from your back to your side while in a flat bed without using bedrails?: None Help needed moving from lying on your back to sitting on the side of a flat bed without using bedrails?: None Help needed moving to and from a bed to a chair (including a wheelchair)?: None Help needed standing up from a chair using your arms (e.g., wheelchair or bedside chair)?: None Help needed to walk in hospital room?: A Little Help needed climbing 3-5 steps with a railing? : A Little 6 Click Score: 22    End of Session   Activity Tolerance: Patient tolerated treatment well Patient left: in bed;with call bell/phone within reach Nurse Communication: Mobility status PT Visit Diagnosis: Muscle weakness (generalized) (M62.81);Difficulty in walking, not elsewhere classified (R26.2);Pain Pain - part of body: (flank drain site)    Time: 3299-2426 PT Time Calculation (min) (ACUTE ONLY): 25 min   Charges:   PT Evaluation $PT Eval Moderate Complexity: 1 Mod PT Treatments $Gait Training: 8-22 mins        11/04/2018  Donnella Sham, [place] [phone]  (pager) [phone]  (office)  Tessie Fass [place]rette 11/04/2018, 4:08 PM

## 2018-11-04 NOTE — Progress Notes (Signed)
PROGRESS NOTE    [name]  AST:[id number] DOB: 06-Jun-1942 DOA: 10/31/2018 PCP: Guadlupe Spanish, MD   Brief Narrative:  72 year old with a history of CAD status post CABG, diabetes mellitus type 2, essential hypertension, hyperlipidemia, liver abscess, diverticulitis, cholecystitis status post laparoscopic cholecystectomy came to the hospital with complains of abdominal pain.  Eventually was found to have 7 x 9.9X 9.4 cm fluid collection.  General surgery and iron were consulted.  Patient was started on antibiotics, IR drain was placed on 7//20.   Assessment & Plan:   Principal Problem:   Abscess, intra-abdominal, postoperative Active Problems:   Hx of CABG   Coronary artery disease   Non-insulin dependent type 2 diabetes mellitus (HCC)   AKI (acute kidney injury) (HCC)   RBBB  Abdominal pain secondary to gallbladder fossa abscess -Status post IR drain placement on 11/01/18. -Blood culture-negative -Wound culture is growing Klebsiella and enterococcus. -We will advance diet as tolerated today.  Eventually should be able to transition to oral antibiotics.  Acute on chronic anemia -Hemoglobin is around 8.0.  Stable.  Continue to monitor this.  CAD status post CABG X4 December 2019 -Does not appear to be in any chest pain.  Appears comfortable from cardiac standpoint.  Diabetes mellitus type 2 -Insulin sliding scale Accu-Chek.  PT/OT evaluation.   DVT prophylaxis: SCDs  Code Status: Full code Family Communication: None at bedside Disposition Plan: Patient still remains quite weak.  Making slow clinical progress.  Unable to tolerate oral diet, once he is able to we will transition him to oral antibiotic.  Consultants:   Surgery  Radiology  Procedures:   Drain placed 7/4  Antimicrobials:   Zosyn   Subjective: Patient tells me he feels quite weak overall and is afraid of going home too soon.  Unable to tolerate oral diet yet but trying his best to keep down  liquid diet.  Review of Systems Otherwise negative except as per HPI, including: General: Denies fever, chills, night sweats or unintended weight loss. Resp: Denies cough, wheezing, shortness of breath. Cardiac: Denies chest pain, palpitations, orthopnea, paroxysmal nocturnal dyspnea. GI: Denies abdominal pain, nausea, vomiting, diarrhea or constipation GU: Denies dysuria, frequency, hesitancy or incontinence MS: Denies muscle aches, joint pain or swelling Neuro: Denies headache, neurologic deficits (focal weakness, numbness, tingling), abnormal gait Psych: Denies anxiety, depression, SI/HI/AVH Skin: Denies new rashes or lesions ID: Denies sick contacts, exotic exposures, travel  Objective: Vitals:   11/04/18 1103 11/04/18 1104 11/04/18 1105 11/04/18 1106  BP:   (!) 165/82   Pulse:   93 95  Resp: 10 16 12 16   Temp:   98.3 F (36.8 C)   TempSrc:   Oral   SpO2:   97% 96%  Weight:      Height:        Intake/Output Summary (Last 24 hours) at 11/04/2018 1303 Last data filed at 11/04/2018 0730 Gross per 24 hour  Intake 550 ml  Output 8 ml  Net 542 ml   Filed Weights   10/31/18 1720 11/01/18 0140  Weight: 105.7 kg 106.3 kg    Examination:  General exam: Appears calm and comfortable  Respiratory system: Clear to auscultation. Respiratory effort normal. Cardiovascular system: S1 & S2 heard, RRR. No JVD, murmurs, rubs, gallops or clicks. No pedal edema. Gastrointestinal system: Abdomen is nondistended, soft and nontender. No organomegaly or masses felt. Normal bowel sounds heard. Central nervous system: Alert and oriented. No focal neurological deficits. Extremities: Symmetric 4 x 5 power.  Skin: Multiple abdominal wound noted, biliary drain noted. Psychiatry: Judgement and insight appear normal. Mood & affect appropriate.     Data Reviewed:   CBC: Recent Labs  Lab 10/31/18 1836  11/02/18 1223 11/02/18 1542 11/03/18 0323 11/03/18 1459 11/04/18 0259  WBC 24.6*   < >  9.2 15.4* 12.4* 10.4 12.2*  NEUTROABS 21.5*  --  8.5* 12.6*  --  8.9*  --   HGB 10.0*   < > 7.6* 7.5* 7.2* 6.4* 8.0*  HCT 32.5*   < > 24.3* 24.0* 23.0* 20.6* 24.6*  MCV 84.6   < > 84.4 83.0 83.0 82.1 83.1  PLT 430*   < > 220 226 223 196 234   < > = values in this interval not displayed.   Basic Metabolic Panel: Recent Labs  Lab 10/31/18 1836 11/02/18 0252 11/03/18 0323 11/04/18 0259  NA 133* 133* 133* 131*  K 5.1 3.8 3.4* 3.9  CL 97* 102 98 98  CO2 20* 21* 22 22  GLUCOSE 197* 132* 132* 151*  BUN 21 12 12 8   CREATININE 1.53* 1.24 1.31* 1.11  CALCIUM 9.5 8.6* 8.6* 8.8*   GFR: Estimated Creatinine Clearance: 70.2 mL/min (by C-G formula based on SCr of 1.11 mg/dL). Liver Function Tests: Recent Labs  Lab 10/31/18 1836 11/02/18 0252 11/03/18 0323 11/04/18 0259  AST 20 14* 22 19  ALT 18 15 22 18   ALKPHOS 62 51 64 69  BILITOT 0.6 0.8 0.6 0.6  PROT 7.1 5.5* 5.3* 5.4*  ALBUMIN 3.6 2.4* 2.3* 2.3*   Recent Labs  Lab 10/31/18 1836 11/02/18 0252 11/03/18 0323 11/04/18 0259  LIPASE 26 24 21 19    No results for input(s): AMMONIA in the last 168 hours. Coagulation Profile: No results for input(s): INR, PROTIME in the last 168 hours. Cardiac Enzymes: No results for input(s): CKTOTAL, CKMB, CKMBINDEX, TROPONINI in the last 168 hours. BNP (last 3 results) No results for input(s): PROBNP in the last 8760 hours. HbA1C: No results for input(s): HGBA1C in the last 72 hours. CBG: Recent Labs  Lab 11/03/18 1122 11/03/18 1700 11/03/18 2119 11/04/18 0617 11/04/18 1102  GLUCAP 162* 158* 162* 153* 168*   Lipid Profile: No results for input(s): CHOL, HDL, LDLCALC, TRIG, CHOLHDL, LDLDIRECT in the last 72 hours. Thyroid Function Tests: No results for input(s): TSH, T4TOTAL, FREET4, T3FREE, THYROIDAB in the last 72 hours. Anemia Panel: No results for input(s): VITAMINB12, FOLATE, FERRITIN, TIBC, IRON, RETICCTPCT in the last 72 hours. Sepsis Labs: Recent Labs  Lab 10/31/18  1939 10/31/18 2146 11/01/18 0516  LATICACIDVEN 3.6* 2.8* 1.5    Recent Results (from the past 240 hour(s))  Blood culture (routine x 2)     Status: None (Preliminary result)   Collection Time: 10/31/18  7:40 PM   Specimen: BLOOD  Result Value Ref Range Status   Specimen Description   Final    BLOOD LEFT HAND Performed at [facility], [address] [place], [place] [address]    Special Requests   Final    BOTTLES DRAWN AEROBIC ONLY Blood Culture results may not be optimal due to an inadequate volume of blood received in culture bottles Performed at [facility], [address] [place], [place] [address]    Culture   Final    NO GROWTH 4 DAYS Performed at [facility], [address] [place], [place] [address]    Report Status PENDING  Incomplete  SARS Coronavirus 2 (Hosp order,Performed in [facility] via [facility] ID)     Status: None   Collection Time: 10/31/18  8:52 PM   Specimen: Dry Nasal Swab ([employer] ID Now)  Result Value Ref Range Status   SARS Coronavirus 2 ([employer] ID Now) NEGATIVE NEGATIVE Final    Comment: (NOTE) SARS-CoV-2 target nucleic acids are NOT DETECTED. The SARS-CoV-2 RNA is generally detectable in upper and lower respiratory specimens during the acute phase of infection.  Negativeresults do not preclude SARS-CoV-2 infection, do not rule out coinfections with other pathogens, and should not be used as the  sole basis for treatment or other patient management decisions.  Negative results must be combined with clinical observations, patient history, and epidemiological information. The expected result is Negative. Fact Sheet for Patients: http://www.graves-ford.org/https://www.fda.gov/media/136524/download Fact Sheet for Healthcare Providers: EnviroConcern.sihttps://www.fda.gov/media/136523/download This test is not yet approved or cleared by the [place] FDA and  has been authorized for detection and/or diagnosis of SARS-CoV-2 by FDA under an Emergency Use  Authorization (EUA).  This EUA will remain in effect (meaning this test can be used) for the duration of  the COVID19 declaration under Section 5 64(b)(1) of the Act, 21 U.S.C.  section [id number] 3(b)(1), unless the authorization is terminated or revoked sooner. Performed at [facility], [address] [place], [place] [address]   Aerobic/Anaerobic Culture (surgical/deep wound)     Status: None (Preliminary result)   Collection Time: 11/01/18 11:33 AM   Specimen: Abdomen; Abscess  Result Value Ref Range Status   Specimen Description ABDOMEN  Final   Special Requests Normal  Final   Gram Stain   Final    MODERATE WBC PRESENT, PREDOMINANTLY PMN MODERATE GRAM NEGATIVE RODS FEW GRAM POSITIVE COCCI IN PAIRS    Culture   Final    ABUNDANT KLEBSIELLA PNEUMONIAE ABUNDANT ENTEROCOCCUS FAECALIS HOLDING FOR POSSIBLE ANAEROBE Performed at [facility], [address] [place], [place] [address]    Report Status PENDING  Incomplete   Organism ID, Bacteria ENTEROCOCCUS FAECALIS  Final   Organism ID, Bacteria KLEBSIELLA PNEUMONIAE  Final      Susceptibility   Enterococcus faecalis - MIC*    AMPICILLIN <=2 SENSITIVE Sensitive     VANCOMYCIN 1 SENSITIVE Sensitive     GENTAMICIN SYNERGY SENSITIVE Sensitive     * ABUNDANT ENTEROCOCCUS FAECALIS   Klebsiella pneumoniae - MIC*    AMPICILLIN >=32 RESISTANT Resistant     CEFAZOLIN >=64 RESISTANT Resistant     CEFEPIME <=1 SENSITIVE Sensitive     CEFTAZIDIME 4 SENSITIVE Sensitive     CEFTRIAXONE 4 SENSITIVE Sensitive     CIPROFLOXACIN <=0.25 SENSITIVE Sensitive     GENTAMICIN <=1 SENSITIVE Sensitive     IMIPENEM <=0.25 SENSITIVE Sensitive     TRIMETH/SULFA <=20 SENSITIVE Sensitive     AMPICILLIN/SULBACTAM >=32 RESISTANT Resistant     PIP/TAZO 8 SENSITIVE Sensitive     Extended ESBL NEGATIVE Sensitive     * ABUNDANT KLEBSIELLA PNEUMONIAE         Radiology Studies: No results found.      Scheduled Meds: . sodium  chloride   Intravenous Once  . insulin aspart  0-9 Units Subcutaneous TID WC  . sodium chloride flush  5 mL Intracatheter Q8H   Continuous Infusions: . piperacillin-tazobactam (ZOSYN)  IV 3.375 g (11/04/18 0621)     LOS: 4 days   Time spent= 35 mins    Ankit Joline Maxcyhirag Amin, MD [facility]  If 7PM-7AM, please contact night-coverage [email or web address] 11/04/2018, 1:03  PM

## 2018-11-04 NOTE — Progress Notes (Signed)
Referring Physician(s): Dr. Donell BeersByerly  Supervising Physician: Richarda OverlieHenn, [name]  [facility]:  [facility]  Chief Complaint: Calvin RayGall bladder fossa abscess  Subjective: Insertion site remains mildly tender as expected.  States he is very weak.  Appears sleepy during visit. S/p 1u PRBC yesterday.  Reports poor appetite which is unusual for him.  Allergies: Ciprofloxacin and Erythromycin ethylsuccinate  Medications: Prior to Admission medications   Medication Sig Start Date End Date Taking? Authorizing Provider  acetaminophen (TYLENOL) 500 MG tablet Take 1,000 mg by mouth daily as needed for moderate pain.   Yes [provider]  amiodarone (PACERONE) 200 MG tablet TAKE 1 TABLET BY MOUTH DAILY Patient taking differently: Take 200 mg by mouth daily.  07/04/18  Yes Runell GessBerry, Jonathan J, MD  amLODipine (NORVASC) 5 MG tablet Take 5 mg by mouth daily. 02/05/18  Yes [provider]  aspirin EC 81 MG tablet Take 1 tablet (81 mg total) by mouth daily. Patient taking differently: Take 81 mg by mouth every evening.  05/01/18  Yes Kilroy, Eda PaschalLuke K, PA-C  Cholecalciferol (VITAMIN D3) 2000 units TABS Take 2,000 Units by mouth 2 (two) times daily.    Yes [provider]  docusate sodium (COLACE) 100 MG capsule Take 100 mg by mouth 2 (two) times daily.    Yes [provider]  ELIQUIS 5 MG TABS tablet TAKE ONE TABLET BY MOUTH TWICE DAILY 06/03/18  Yes Runell GessBerry, Jonathan J, MD  finasteride (PROSCAR) 5 MG tablet Take 5 mg by mouth daily. 02/05/18  Yes [provider]  loratadine (CLARITIN) 10 MG tablet Take 10 mg by mouth daily.   Yes [provider]  metFORMIN (GLUCOPHAGE) 1000 MG tablet Take 1,000 mg by mouth 2 (two) times daily. 02/05/18  Yes [provider]  metoprolol tartrate (LOPRESSOR) 25 MG tablet Take 1 tablet (25 mg total) by mouth 2 (two) times daily. 05/20/18  Yes Runell GessBerry, Jonathan J, MD  Multiple Vitamins-Minerals (MULTIVITAMIN ADULT PO) Take 1 tablet  by mouth daily.   Yes [provider]  oxyCODONE (OXY IR/ROXICODONE) 5 MG immediate release tablet Take 1 tablet (5 mg total) by mouth every 6 (six) hours as needed for severe pain. Patient not taking: Reported on 11/01/2018 10/07/18   Almond LintByerly, Faera, MD     Vital Signs: BP (!) 165/82 (BP Location: Left Arm)    Pulse 95    Temp 98.3 F (36.8 C) (Oral)    Resp 16    Ht 5\' 11"  (1.803 m)    Wt 234 lb 4.8 oz (106.3 kg)    SpO2 96%    BMI 32.68 kg/m   Physical Exam  NAD, alert, resting in bed.  Abdomen:  RUQ drain in place. Insertion site c/d/i. Signifcant amount of thick, dark, bloody drainage continues.  No clot today. Flushes easily. Fluid thinning.   Imaging: Ct Abdomen Pelvis W Contrast  Result Date: 10/31/2018 CLINICAL DATA:  Gallbladder surgery 3 weeks ago. History of liver abscess. Fever. EXAM: CT ABDOMEN AND PELVIS WITH CONTRAST TECHNIQUE: Multidetector CT imaging of the abdomen and pelvis was performed using the standard protocol following bolus administration of intravenous contrast. CONTRAST:  80mL OMNIPAQUE IOHEXOL 300 MG/ML  SOLN COMPARISON:  MRI of the abdomen July 01, 2018. CT scan of the abdomen and pelvis June 11, 2018. FINDINGS: Lower chest: No suspicious nodules, masses, or infiltrates in the lung bases. Lower chest is unremarkable. Hepatobiliary: Patient is status post cholecystectomy. There is a collection of debris, fluid, and gas  centered in the gallbladder fossa. There is an oval region of high attenuation within the fossa, likely blood products from recent surgery. The fluid tracks superiorly along the anterior margin of the liver. Some low attenuation seen anterior to the liver on axial image 14 may represent some debris tracks superiorly. On image number 11, there is some high attenuation anterior to the liver which could represent a tiny amount of air tracking from the gallbladder fossa. The collection abuts the first and second portions of the duodenum which is  thickened and likely secondarily inflamed. The greatest dimension of the abnormality in the gallbladder fossa is 7 by 9.9 by 9.4 cm. The liver parenchyma itself is normal in appearance. The portal vein is patent. Pancreas: Unremarkable. No pancreatic ductal dilatation or surrounding inflammatory changes. Spleen: Normal in size without focal abnormality. 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 Adrenal glands are otherwise normal. There are at least 3 cysts in the left kidney. No hydronephrosis or stones. No evidence of obstruction. No ureteral stones. The bladder is normal. Stomach/Bowel: The stomach is normal. There is thickening of the proximal duodenum due to the abscess in the gallbladder fossa resulting in secondary inflammation. The abscess likely arises from the recent surgery. The small bowel is otherwise normal. Colonic diverticulosis is seen without diverticulitis. The appendix is normal. Vascular/Lymphatic: Atherosclerotic changes are seen in the nonaneurysmal aorta. No adenopathy. Reproductive: Prostate is unremarkable. Other: No abdominal wall hernia or abnormality. No abdominopelvic ascites. Musculoskeletal: No acute or significant osseous findings. IMPRESSION: 1. There is a 7 x 9.9 x 9.4 cm collection of fluid, debris, and gas centered in the gallbladder fossa with significant adjacent fat stranding. This finding is consistent with a postoperative abscess. The abscess abuts the proximal duodenum resulting in secondary wall thickening/inflammation. A small amount of debris and gas may extend anteriorly along the liver extending towards the dome. 2. Diverticulosis without diverticulitis. 3. Atherosclerotic changes in the nonaneurysmal aorta. Electronically Signed   By: Dorise Bullion III M.D   On: 10/31/2018 21:26   Ct Image Guided Drainage By Percutaneous Catheter  Result Date: 11/01/2018 INDICATION: Cholecystectomy 3 weeks ago, gallbladder fossa fluid  collection EXAM: CT DRAIN GALLBLADDER FOSSA ABSCESS MEDICATIONS: The patient is currently admitted to the [facility] and receiving intravenous antibiotics. The antibiotics were administered within an appropriate time frame prior to the initiation of the procedure. ANESTHESIA/SEDATION: Fentanyl 100 mcg IV; Versed 2.0 mg IV Moderate Sedation Time:  13 minutes The patient was continuously monitored during the procedure by the interventional radiology nurse under my direct supervision. COMPLICATIONS: None immediate. PROCEDURE: Informed written consent was obtained from the patient after a thorough discussion of the procedural risks, benefits and alternatives. All questions were addressed. Maximal Sterile Barrier Technique was utilized including caps, mask, sterile gowns, sterile gloves, sterile drape, hand hygiene and skin antiseptic. A timeout was performed prior to the initiation of the procedure. Previous imaging reviewed. Patient positioned supine. Noncontrast localization CT performed. The large complex heterogeneous air-fluid collection in the gallbladder fossa was localized. Overlying skin marked for a lateral intercostal approach. Under sterile conditions and local anesthesia, an 18 gauge 10 cm access needle was advanced into the collection. Syringe aspiration yielded dark malodorous thick blood compatible with infected hematoma. Guidewire advanced followed by tract dilatation to insert a 14 [place] drain. Drain catheter position confirmed with CT. Sample sent for culture. Catheter secured with Prolene suture and connected to external suction bulb. Sterile dressing applied. No immediate complication.  Patient tolerated the procedure well. IMPRESSION: Successful CT-guided gallbladder fossa abscess drain placement. Electronically Signed   By: Judie PetitM.  Shick M.D.   On: 11/01/2018 12:09    Labs:  CBC: Recent Labs    11/02/18 1542 11/03/18 0323 11/03/18 1459 11/04/18 0259  WBC 15.4* 12.4* 10.4 12.2*  HGB 7.5*  7.2* 6.4* 8.0*  HCT 24.0* 23.0* 20.6* 24.6*  PLT 226 223 196 234    COAGS: Recent Labs    03/14/18 1610 03/26/18 1146 03/31/18 1458 04/08/18 0238 05/10/18 0907 10/06/18 0903  INR 0.96 0.96 1.37 1.06 1.42 0.9  APTT 32 31 29  --   --   --     BMP: Recent Labs    10/31/18 1836 11/02/18 0252 11/03/18 0323 11/04/18 0259  NA 133* 133* 133* 131*  K 5.1 3.8 3.4* 3.9  CL 97* 102 98 98  CO2 20* 21* 22 22  GLUCOSE 197* 132* 132* 151*  BUN 21 12 12 8   CALCIUM 9.5 8.6* 8.6* 8.8*  CREATININE 1.53* 1.24 1.31* 1.11  GFRNONAA 44* 56* 53* >60  GFRAA 50* >60 >60 >60    LIVER FUNCTION TESTS: Recent Labs    10/31/18 1836 11/02/18 0252 11/03/18 0323 11/04/18 0259  BILITOT 0.6 0.8 0.6 0.6  AST 20 14* 22 19  ALT 18 15 22 18   ALKPHOS 62 51 64 69  PROT 7.1 5.5* 5.3* 5.4*  ALBUMIN 3.6 2.4* 2.3* 2.3*    Assessment and Plan: Intra-abdominal fluid collection, gall bladder fossa abscess s/p drain placement 11/01/18 by Dr. Miles CostainShick.  Patient feeling better today.  Afebrile.  WBC back up to 12.2 S/p blood transfusion yesterday for HgB of 6.4.  Now 8.0 Culture pending but with Klebsiella, enterococcus faecalis.  Remains on Zosyn.  Continue flushes. IR following.   Electronically Signed: Hoyt KochKacie Sue-Ellen Reece Fehnel, PA 11/04/2018, 11:13 AM   I spent a total of 15 Minutes at the the patient's bedside AND on the patient's [facility] floor or unit, greater than 50% of which was counseling/coordinating care for intra-abdominal fluid collection.

## 2018-11-04 NOTE — Progress Notes (Signed)
   Subjective/Chief Complaint: Feels weak.    Objective: Vital signs in last 24 hours: Temp:  [98 F (36.7 C)-99.8 F (37.7 C)] 98 F (36.7 C) (07/07 0405) Pulse Rate:  [93-130] 130 (07/07 0405) Resp:  [13-25] 13 (07/07 0405) BP: (122-170)/(63-86) 136/75 (07/07 0405) SpO2:  [95 %-98 %] 96 % (07/07 0405) Last BM Date: 11/04/18  Intake/Output from previous day: 07/06 0701 - 07/07 0700 In: 975 [P.O.:525; I.V.:55; Blood:325; IV Piggyback:50] Out: 8 [Urine:2; Drains:5; Stool:1] Intake/Output this shift: No intake/output data recorded.  Exam: Awake and alert Abdomen soft, minimally tender, drain looks like old hematoma, fluid in tubing is thin ss  Lab Results:  Recent Labs    11/03/18 1459 11/04/18 0259  WBC 10.4 12.2*  HGB 6.4* 8.0*  HCT 20.6* 24.6*  PLT 196 234   BMET Recent Labs    11/03/18 0323 11/04/18 0259  NA 133* 131*  K 3.4* 3.9  CL 98 98  CO2 22 22  GLUCOSE 132* 151*  BUN 12 8  CREATININE 1.31* 1.11  CALCIUM 8.6* 8.8*   PT/INR No results for input(s): LABPROT, INR in the last 72 hours. ABG No results for input(s): PHART, HCO3 in the last 72 hours.  Invalid input(s): PCO2, PO2  Studies/Results: No results found.  Anti-infectives: Anti-infectives (From admission, onward)   Start     Dose/Rate Route Frequency Ordered Stop   11/01/18 0600  ceFEPIme (MAXIPIME) 1 g in sodium chloride 0.9 % 100 mL IVPB  Status:  Discontinued     1 g 200 mL/hr over 30 Minutes Intravenous Every 8 hours 10/31/18 1957 11/01/18 0256   11/01/18 0400  piperacillin-tazobactam (ZOSYN) IVPB 3.375 g     3.375 g 12.5 mL/hr over 240 Minutes Intravenous Every 8 hours 11/01/18 0258     10/31/18 1944  ceFEPIme (MAXIPIME) 2 g injection    Note to Pharmacy: Daine Gravel   : cabinet override      10/31/18 1944 11/01/18 0759   10/31/18 1915  ceFEPIme (MAXIPIME) 2 g in sodium chloride 0.9 % 100 mL IVPB     2 g 200 mL/hr over 30 Minutes Intravenous  Once 10/31/18 1911 10/31/18  2052   10/31/18 1915  metroNIDAZOLE (FLAGYL) IVPB 500 mg     500 mg 100 mL/hr over 60 Minutes Intravenous  Once 10/31/18 1911 10/31/18 2100      Assessment/Plan:  Gallbladder fossa abscess  S/p IR drain that looks like old infected hematoma. Continue to monitor for signs of bile leak. Awaiting cultures- can transition to PO abx  Ok to advance po from surgery standpoint, ok to progress toward discharge when medically ready   LOS: 4 days    Clovis Riley 11/04/2018

## 2018-11-04 NOTE — Care Management Important Message (Signed)
Important Message  Patient Details  Name: RAYQUON USELMAN MRN: [id number] Date of Birth: July 12, 1942   Medicare Important Message Given:  Yes     Shelda Altes 11/04/2018, 2:11 PM

## 2018-11-05 LAB — CBC
HCT: 24.7 % — ABNORMAL LOW (ref 39.0–52.0)
Hemoglobin: 8 g/dL — ABNORMAL LOW (ref 13.0–17.0)
MCH: 26.9 pg (ref 26.0–34.0)
MCHC: 32.4 g/dL (ref 30.0–36.0)
MCV: 83.2 fL (ref 80.0–100.0)
Platelets: 228 K/uL (ref 150–400)
RBC: 2.97 MIL/uL — ABNORMAL LOW (ref 4.22–5.81)
RDW: 16.3 % — ABNORMAL HIGH (ref 11.5–15.5)
WBC: 11.2 K/uL — ABNORMAL HIGH (ref 4.0–10.5)
nRBC: 0 % (ref 0.0–0.2)

## 2018-11-05 LAB — CULTURE, BLOOD (ROUTINE X 2): Culture: NO GROWTH

## 2018-11-05 LAB — COMPREHENSIVE METABOLIC PANEL
ALT: 15 U/L (ref 0–44)
AST: 18 U/L (ref 15–41)
Albumin: 2.2 g/dL — ABNORMAL LOW (ref 3.5–5.0)
Alkaline Phosphatase: 64 U/L (ref 38–126)
Anion gap: 9 (ref 5–15)
BUN: 8 mg/dL (ref 8–23)
CO2: 22 mmol/L (ref 22–32)
Calcium: 8.7 mg/dL — ABNORMAL LOW (ref 8.9–10.3)
Chloride: 99 mmol/L (ref 98–111)
Creatinine, Ser: 1.13 mg/dL (ref 0.61–1.24)
GFR calc Af Amer: >60 mL/min (ref >60)
GFR calc non Af Amer: >60 mL/min (ref >60)
Glucose, Bld: 149 mg/dL — ABNORMAL HIGH (ref 70–99)
Potassium: 3.4 mmol/L — ABNORMAL LOW (ref 3.5–5.1)
Sodium: 130 mmol/L — ABNORMAL LOW (ref 135–145)
Total Bilirubin: 0.7 mg/dL (ref 0.3–1.2)
Total Protein: 5.3 g/dL — ABNORMAL LOW (ref 6.5–8.1)

## 2018-11-05 LAB — GLUCOSE, CAPILLARY
Glucose-Capillary: 159 mg/dL — ABNORMAL HIGH (ref 70–99)
Glucose-Capillary: 156 mg/dL — ABNORMAL HIGH (ref 70–99)
Glucose-Capillary: 145 mg/dL — ABNORMAL HIGH (ref 70–99)
Glucose-Capillary: 155 mg/dL — ABNORMAL HIGH (ref 70–99)

## 2018-11-05 MED ORDER — POTASSIUM CHLORIDE CRYS ER 20 MEQ PO TBCR
40.0000 meq | Freq: Once | ORAL | Status: AC
Start: 2018-11-05 — End: 2018-11-05
  Administered 2018-11-05: 40 meq via ORAL
  Filled 2018-11-05: qty 2

## 2018-11-05 MED ORDER — METOCLOPRAMIDE HCL 5 MG/ML IJ SOLN
10.0000 mg | Freq: Three times a day (TID) | INTRAMUSCULAR | Status: DC | PRN
Start: 2018-11-05 — End: 2018-11-12
  Administered 2018-11-05 – 2018-11-06 (×2): 10 mg via INTRAVENOUS
  Filled 2018-11-05 (×2): qty 2

## 2018-11-05 MED ORDER — ONDANSETRON HCL 4 MG/2ML IJ SOLN
4.0000 mg | Freq: Four times a day (QID) | INTRAMUSCULAR | Status: DC | PRN
Start: 2018-11-05 — End: 2018-11-12
  Administered 2018-11-05 – 2018-11-07 (×4): 4 mg via INTRAVENOUS
  Filled 2018-11-05 (×4): qty 2

## 2018-11-05 MED ORDER — METOPROLOL TARTRATE 25 MG PO TABS
25.0000 mg | Freq: Two times a day (BID) | ORAL | Status: DC
Start: 2018-11-05 — End: 2018-11-12
  Administered 2018-11-05 – 2018-11-12 (×15): 25 mg via ORAL
  Filled 2018-11-05 (×15): qty 1

## 2018-11-05 MED ORDER — DOCUSATE SODIUM 100 MG PO CAPS
100.0000 mg | Freq: Two times a day (BID) | ORAL | Status: DC
Start: 2018-11-05 — End: 2018-11-12
  Administered 2018-11-06 – 2018-11-09 (×6): 100 mg via ORAL
  Filled 2018-11-05 (×9): qty 1

## 2018-11-05 MED ORDER — APIXABAN 5 MG PO TABS
5.0000 mg | Freq: Two times a day (BID) | ORAL | Status: DC
Start: 2018-11-05 — End: 2018-11-12
  Administered 2018-11-05 – 2018-11-12 (×15): 5 mg via ORAL
  Filled 2018-11-05 (×15): qty 1

## 2018-11-05 MED ORDER — FINASTERIDE 5 MG PO TABS
5.0000 mg | Freq: Every day | ORAL | Status: DC
Start: 2018-11-05 — End: 2018-11-12
  Administered 2018-11-05 – 2018-11-12 (×8): 5 mg via ORAL
  Filled 2018-11-05 (×8): qty 1

## 2018-11-05 MED ORDER — ASPIRIN EC 81 MG PO TBEC
81.0000 mg | Freq: Every day | ORAL | Status: DC
Start: 2018-11-05 — End: 2018-11-12
  Administered 2018-11-05 – 2018-11-12 (×8): 81 mg via ORAL
  Filled 2018-11-05 (×8): qty 1

## 2018-11-05 NOTE — Progress Notes (Signed)
Pt ambulated approximately 100' in hall on room air, standby assist.

## 2018-11-05 NOTE — Progress Notes (Addendum)
PROGRESS NOTE    [name]  WUJ:[id number] DOB: 1942-05-22 DOA: 10/31/2018 PCP: Karle Plumber, MD   Brief Narrative:  73 year old with a history of CAD status post CABG, diabetes mellitus type 2, essential hypertension, hyperlipidemia, liver abscess, diverticulitis, cholecystitis status post laparoscopic cholecystectomy came to the hospital with complains of abdominal pain.  Eventually was found to have 7 x 9.9X 9.4 cm fluid collection.  General surgery and iron were consulted.  Patient was started on antibiotics, IR drain was placed on 7//20.   Assessment & Plan:   Principal Problem:   Abscess, intra-abdominal, postoperative Active Problems:   Hx of CABG   Coronary artery disease   Non-insulin dependent type 2 diabetes mellitus (HCC)   AKI (acute kidney injury) (HCC)   RBBB  Abdominal pain secondary to gallbladder fossa abscess -Status post IR drain placement on 11/01/18. Follow up CT planned on 11/07/18 -Blood culture-negative -Wound culture is growing Klebsiella and enterococcus. Plan would be to send him home on Augmentin and Bactrim for total for 7-10 days.  -Monitor her PO intake today.   Acute on chronic anemia -Hemoglobin is around 8.0.  Stable.  Continue to monitor this.  CAD status post CABG X4 December 2019 A fib/Flutter, controlled. S/p Bypass -Does not appear to be in any chest pain.  Appears comfortable from cardiac standpoint. Will ask Radiology to see when we can resume Eliquis.   Diabetes mellitus type 2 -Insulin sliding scale Accu-Chek.  PT/OT evaluation- rec home HH  DVT prophylaxis: SCDs  Code Status: Full code Family Communication: None at bedside Disposition Plan: Still very weak, unable to discharge. PO intake is still somewhat inconsistent. Once consistent, will swtich to PO Abx.   Consultants:   Surgery  Radiology  Procedures:   Drain placed 7/4  Antimicrobials:   Zosyn   Subjective: Feels very weak overall. He doesn't feel  like its safe for him to go home yet. He feels after eating PO diet, he may feels bit more stronger soon.   Review of Systems Otherwise negative except as per HPI, including: General = no fevers, chills, dizziness, malaise, fatigue HEENT/EYES = negative for pain, redness, loss of vision, double vision, blurred vision, loss of hearing, sore throat, hoarseness, dysphagia Cardiovascular= negative for chest pain, palpitation, murmurs, lower extremity swelling Respiratory/lungs= negative for shortness of breath, cough, hemoptysis, wheezing, mucus production Gastrointestinal= negative for nausea, vomiting,, abdominal pain, melena, hematemesis Genitourinary= negative for Dysuria, Hematuria, Change in Urinary Frequency MSK = Negative for arthralgia, myalgias, Back Pain, Joint swelling  Neurology= Negative for headache, seizures, numbness, tingling  Psychiatry= Negative for anxiety, depression, suicidal and homocidal ideation Allergy/Immunology= Medication/Food allergy as listed  Skin= Negative for Rash, lesions, ulcers, itching   Objective: Vitals:   11/04/18 2052 11/04/18 2314 11/05/18 0340 11/05/18 0808  BP: (!) 147/78 (!) 162/86 (!) 151/76 137/84  Pulse: 95  93 88  Resp: Temp: 97.7 F (36.5 C) 98.1 F (36.7 C) (!) 97.5 F (36.4 C) 97.9 F (36.6 C)  TempSrc: Oral Oral Oral Oral  SpO2: 98% 100% 96% 99%  Weight:      Height:        Intake/Output Summary (Last 24 hours) at 11/05/2018 1117 Last data filed at 11/05/2018 0610 Gross per 24 hour  Intake 365 ml  Output 203 ml  Net 162 ml   Filed Weights   10/31/18 1720 11/01/18 0140  Weight: 105.7 kg 106.3 kg    Examination:  Constitutional: NAD, calm,  comfortable Eyes: PERRL, lids and conjunctivae normal ENMT: Mucous membranes are moist. Posterior pharynx clear of any exudate or lesions.Normal dentition.  Neck: normal, supple, no masses, no thyromegaly Respiratory: clear to auscultation bilaterally, no wheezing, no  crackles. Normal respiratory effort. No accessory muscle use.  Cardiovascular: Regular rate and rhythm, no murmurs / rubs / gallops. No extremity edema. 2+ pedal pulses. No carotid bruits.  Abdomen: no tenderness, no masses palpated. No hepatosplenomegaly. Bowel sounds positive. RUQ drain noted with bloody discharge.  Musculoskeletal: no clubbing / cyanosis. No joint deformity upper and lower extremities. Good ROM, no contractures. Normal muscle tone.  Skin: no rashes, lesions, ulcers. No induration Neurologic: CN 2-12 grossly intact. Sensation intact, DTR normal. Strength 4/5 in all 4.  Psychiatric: Normal judgment and insight. Alert and oriented x 3. Normal mood.     Data Reviewed:   CBC: Recent Labs  Lab 10/31/18 1836  11/02/18 1223 11/02/18 1542 11/03/18 0323 11/03/18 1459 11/04/18 0259 11/05/18 0421  WBC 24.6*   < > 9.2 15.4* 12.4* 10.4 12.2* 11.2*  NEUTROABS 21.5*  --  8.5* 12.6*  --  8.9*  --   --   HGB 10.0*   < > 7.6* 7.5* 7.2* 6.4* 8.0* 8.0*  HCT 32.5*   < > 24.3* 24.0* 23.0* 20.6* 24.6* 24.7*  MCV 84.6   < > 84.4 83.0 83.0 82.1 83.1 83.2  PLT 430*   < > 220 226 223 196 234 228   < > = values in this interval not displayed.   Basic Metabolic Panel: Recent Labs  Lab 10/31/18 1836 11/02/18 0252 11/03/18 0323 11/04/18 0259 11/05/18 0421  NA 133* 133* 133* 131* 130*  K 5.1 3.8 3.4* 3.9 3.4*  CL 97* 102 98 98 99  CO2 20* 21* 22 22 22   GLUCOSE 197* 132* 132* 151* 149*  BUN 21 12 12 8 8   CREATININE 1.53* 1.24 1.31* 1.11 1.13  CALCIUM 9.5 8.6* 8.6* 8.8* 8.7*   GFR: Estimated Creatinine Clearance: 69 mL/min (by C-G formula based on SCr of 1.13 mg/dL). Liver Function Tests: Recent Labs  Lab 10/31/18 1836 11/02/18 0252 11/03/18 0323 11/04/18 0259 11/05/18 0421  AST 20 14* 22 19 18   ALT 18 15 22 18 15   ALKPHOS 62 51 64 69 64  BILITOT 0.6 0.8 0.6 0.6 0.7  PROT 7.1 5.5* 5.3* 5.4* 5.3*  ALBUMIN 3.6 2.4* 2.3* 2.3* 2.2*   Recent Labs  Lab 10/31/18 1836  11/02/18 0252 11/03/18 0323 11/04/18 0259  LIPASE 26 24 21 19    No results for input(s): AMMONIA in the last 168 hours. Coagulation Profile: No results for input(s): INR, PROTIME in the last 168 hours. Cardiac Enzymes: No results for input(s): CKTOTAL, CKMB, CKMBINDEX, TROPONINI in the last 168 hours. BNP (last 3 results) No results for input(s): PROBNP in the last 8760 hours. HbA1C: No results for input(s): HGBA1C in the last 72 hours. CBG: Recent Labs  Lab 11/04/18 1102 11/04/18 1609 11/04/18 2051 11/05/18 0614 11/05/18 1114  GLUCAP 168* 185* 145* 159* 156*   Lipid Profile: No results for input(s): CHOL, HDL, LDLCALC, TRIG, CHOLHDL, LDLDIRECT in the last 72 hours. Thyroid Function Tests: No results for input(s): TSH, T4TOTAL, FREET4, T3FREE, THYROIDAB in the last 72 hours. Anemia Panel: No results for input(s): VITAMINB12, FOLATE, FERRITIN, TIBC, IRON, RETICCTPCT in the last 72 hours. Sepsis Labs: Recent Labs  Lab 10/31/18 1939 10/31/18 2146 11/01/18 0516  LATICACIDVEN 3.6* 2.8* 1.5    Recent Results (from the past 240  hour(s))  Blood culture (routine x 2)     Status: None   Collection Time: 10/31/18  7:40 PM   Specimen: BLOOD  Result Value Ref Range Status   Specimen Description   Final    BLOOD LEFT HAND Performed at [facility], [address] [place], [place] [address]    Special Requests   Final    BOTTLES DRAWN AEROBIC ONLY Blood Culture results may not be optimal due to an inadequate volume of blood received in culture bottles Performed at [facility] [place], [address] [place], [place] [address]    Culture   Final    NO GROWTH 5 DAYS Performed at [facility], [address] [place], [place] [address]    Report Status 11/05/2018 FINAL  Final  SARS Coronavirus 2 (Hosp order,Performed in [facility] lab via [employer] ID)     Status: None   Collection Time: 10/31/18  8:52 PM   Specimen: Dry Nasal Swab ([employer] ID Now)  Result  Value Ref Range Status   SARS Coronavirus 2 ([employer] ID Now) NEGATIVE NEGATIVE Final    Comment: (NOTE) SARS-CoV-2 target nucleic acids are NOT DETECTED. The SARS-CoV-2 RNA is generally detectable in upper and lower respiratory specimens during the acute phase of infection.  Negativeresults do not preclude SARS-CoV-2 infection, do not rule out coinfections with other pathogens, and should not be used as the  sole basis for treatment or other patient management decisions.  Negative results must be combined with clinical observations, patient history, and epidemiological information. The expected result is Negative. Fact Sheet for Patients: http://www.graves-ford.org/https://www.fda.gov/media/136524/download Fact Sheet for Healthcare Providers: EnviroConcern.sihttps://www.fda.gov/media/136523/download This test is not yet approved or cleared by the [place] FDA and  has been authorized for detection and/or diagnosis of SARS-CoV-2 by FDA under an Emergency Use Authorization (EUA).  This EUA will remain in effect (meaning this test can be used) for the duration of  the COVID19 declaration under Section 5 64(b)(1) of the Act, 21 U.S.C.  section 9521259755360bbb 3(b)(1), unless the authorization is terminated or revoked sooner. Performed at [facility] [place], [address] [place], [place] [address]   Aerobic/Anaerobic Culture (surgical/deep wound)     Status: None (Preliminary result)   Collection Time: 11/01/18 11:33 AM   Specimen: Abdomen; Abscess  Result Value Ref Range Status   Specimen Description ABDOMEN  Final   Special Requests Normal  Final   Gram Stain   Final    MODERATE WBC PRESENT, PREDOMINANTLY PMN MODERATE GRAM NEGATIVE RODS FEW GRAM POSITIVE COCCI IN PAIRS    Culture   Final    ABUNDANT KLEBSIELLA PNEUMONIAE ABUNDANT ENTEROCOCCUS FAECALIS ABUNDANT PREVOTELLA DENTICOLA BETA LACTAMASE POSITIVE Performed at [facility], [address] [place], [place] [address]    Report Status PENDING   Incomplete   Organism ID, Bacteria ENTEROCOCCUS FAECALIS  Final   Organism ID, Bacteria KLEBSIELLA PNEUMONIAE  Final      Susceptibility   Enterococcus faecalis - MIC*    AMPICILLIN <=2 SENSITIVE Sensitive     VANCOMYCIN 1 SENSITIVE Sensitive     GENTAMICIN SYNERGY SENSITIVE Sensitive     * ABUNDANT ENTEROCOCCUS FAECALIS   Klebsiella pneumoniae - MIC*    AMPICILLIN >=32 RESISTANT Resistant     CEFAZOLIN >=64 RESISTANT Resistant     CEFEPIME <=1 SENSITIVE Sensitive     CEFTAZIDIME 4 SENSITIVE Sensitive     CEFTRIAXONE 4 SENSITIVE Sensitive     CIPROFLOXACIN <=  0.25 SENSITIVE Sensitive     GENTAMICIN <=1 SENSITIVE Sensitive     IMIPENEM <=0.25 SENSITIVE Sensitive     TRIMETH/SULFA <=20 SENSITIVE Sensitive     AMPICILLIN/SULBACTAM >=32 RESISTANT Resistant     PIP/TAZO 8 SENSITIVE Sensitive     Extended ESBL NEGATIVE Sensitive     * ABUNDANT KLEBSIELLA PNEUMONIAE         Radiology Studies: No results found.      Scheduled Meds: . sodium chloride   Intravenous Once  . insulin aspart  0-9 Units Subcutaneous TID WC  . sodium chloride flush  5 mL Intracatheter Q8H   Continuous Infusions: . piperacillin-tazobactam (ZOSYN)  IV 3.375 g (11/05/18 0610)     LOS: 5 days   Time spent= 35 mins    Denaya Horn Joline Maxcyhirag Temia Debroux, MD [facility]  If 7PM-7AM, please contact night-coverage [email or web address] 11/05/2018, 11:17 AM

## 2018-11-05 NOTE — Progress Notes (Signed)
Referring Physician(s): Byerly,F  Supervising Physician: Corrie Mckusick  Patient Status:  [facility] - In-pt  Chief Complaint:  Abdominal pain, gallbladder fossa abscess  Subjective: Pt still feels weak and has some soreness at RUQ drain site; denies N/V; sitting up in chair   Allergies: Ciprofloxacin and Erythromycin ethylsuccinate  Medications: Prior to Admission medications   Medication Sig Start Date End Date Taking? Authorizing Provider  acetaminophen (TYLENOL) 500 MG tablet Take 1,000 mg by mouth daily as needed for moderate pain.   Yes [provider]  amiodarone (PACERONE) 200 MG tablet TAKE 1 TABLET BY MOUTH DAILY Patient taking differently: Take 200 mg by mouth daily.  07/04/18  Yes Lorretta Harp, MD  amLODipine (NORVASC) 5 MG tablet Take 5 mg by mouth daily. 02/05/18  Yes [provider]  aspirin EC 81 MG tablet Take 1 tablet (81 mg total) by mouth daily. Patient taking differently: Take 81 mg by mouth every evening.  05/01/18  Yes Kilroy, Doreene Burke, PA-C  Cholecalciferol (VITAMIN D3) 2000 units TABS Take 2,000 Units by mouth 2 (two) times daily.    Yes [provider]  docusate sodium (COLACE) 100 MG capsule Take 100 mg by mouth 2 (two) times daily.    Yes [provider]  ELIQUIS 5 MG TABS tablet TAKE ONE TABLET BY MOUTH TWICE DAILY 06/03/18  Yes Lorretta Harp, MD  finasteride (PROSCAR) 5 MG tablet Take 5 mg by mouth daily. 02/05/18  Yes [provider]  loratadine (CLARITIN) 10 MG tablet Take 10 mg by mouth daily.   Yes [provider]  metFORMIN (GLUCOPHAGE) 1000 MG tablet Take 1,000 mg by mouth 2 (two) times daily. 02/05/18  Yes [provider]  metoprolol tartrate (LOPRESSOR) 25 MG tablet Take 1 tablet (25 mg total) by mouth 2 (two) times daily. 05/20/18  Yes Lorretta Harp, MD  Multiple Vitamins-Minerals (MULTIVITAMIN ADULT PO) Take 1 tablet by mouth daily.   Yes [provider]  oxyCODONE (OXY  IR/ROXICODONE) 5 MG immediate release tablet Take 1 tablet (5 mg total) by mouth every 6 (six) hours as needed for severe pain. Patient not taking: Reported on 11/01/2018 10/07/18   [name], MD     Vital Signs: BP 137/84 (BP Location: Right Arm)   Pulse 88   Temp 97.9 F (36.6 C) (Oral)   Resp 18   Ht 5\' 11"  (1.803 m)   Wt 234 lb 4.8 oz (106.3 kg)   SpO2 99%   BMI 32.68 kg/m   Physical Exam awake ,alert; RUQ drain intact, insertion site ok, mildly tender, output minimal amt blood-tinged fluid; drain flushed with minimal return of blood-tinged fluid  Imaging: Ct Image Guided Drainage By Percutaneous Catheter  Result Date: 11/01/2018 INDICATION: Cholecystectomy 3 weeks ago, gallbladder fossa fluid collection EXAM: CT DRAIN GALLBLADDER FOSSA ABSCESS MEDICATIONS: The patient is currently admitted to the hospital and receiving intravenous antibiotics. The antibiotics were administered within an appropriate time frame prior to the initiation of the procedure. ANESTHESIA/SEDATION: Fentanyl 100 mcg IV; Versed 2.0 mg IV Moderate Sedation Time:  13 minutes The patient was continuously monitored during the procedure by the interventional radiology nurse under my direct supervision. COMPLICATIONS: None immediate. PROCEDURE: Informed written consent was obtained from the patient after a thorough discussion of the procedural risks, benefits and alternatives. All questions were addressed. Maximal Sterile Barrier Technique was utilized including caps, mask, sterile gowns, sterile gloves, sterile drape, hand hygiene and skin antiseptic. A timeout was performed prior  to the initiation of the procedure. Previous imaging reviewed. Patient positioned supine. Noncontrast localization CT performed. The large complex heterogeneous air-fluid collection in the gallbladder fossa was localized. Overlying skin marked for a lateral intercostal approach. Under sterile conditions and local anesthesia, an 18 gauge 10 cm  access needle was advanced into the collection. Syringe aspiration yielded dark malodorous thick blood compatible with infected hematoma. Guidewire advanced followed by tract dilatation to insert a 14 [place]French drain. Drain catheter position confirmed with CT. Sample sent for culture. Catheter secured with Prolene suture and connected to external suction bulb. Sterile dressing applied. No immediate complication. Patient tolerated the procedure well. IMPRESSION: Successful CT-guided gallbladder fossa abscess drain placement. Electronically Signed   By: Judie PetitM.  Shick M.D.   On: 11/01/2018 12:09    Labs:  CBC: Recent Labs    11/03/18 0323 11/03/18 1459 11/04/18 0259 11/05/18 0421  WBC 12.4* 10.4 12.2* 11.2*  HGB 7.2* 6.4* 8.0* 8.0*  HCT 23.0* 20.6* 24.6* 24.7*  PLT 223 196 234 228    COAGS: Recent Labs    03/14/18 1610 03/26/18 1146 03/31/18 1458 04/08/18 0238 05/10/18 0907 10/06/18 0903  INR 0.96 0.96 1.37 1.06 1.42 0.9  APTT 32 31 29  --   --   --     BMP: Recent Labs    11/02/18 0252 11/03/18 0323 11/04/18 0259 11/05/18 0421  NA 133* 133* 131* 130*  K 3.8 3.4* 3.9 3.4*  CL 102 98 98 99  CO2 21* 22 22 22   GLUCOSE 132* 132* 151* 149*  BUN 12 12 8 8   CALCIUM 8.6* 8.6* 8.8* 8.7*  CREATININE 1.24 1.31* 1.11 1.13  GFRNONAA 56* 53* >60 >60  GFRAA >60 >60 >60 >60    LIVER FUNCTION TESTS: Recent Labs    11/02/18 0252 11/03/18 0323 11/04/18 0259 11/05/18 0421  BILITOT 0.8 0.6 0.6 0.7  AST 14* 22 19 18   ALT 15 22 18 15   ALKPHOS 51 64 69 64  PROT 5.5* 5.3* 5.4* 5.3*  ALBUMIN 2.4* 2.3* 2.3* 2.2*    Assessment and Plan: Pt s/p cholecystectomy 6/9 with subsequent GB fossa abscess, s/p drain placement 7/4; afebrile; WBC 11.2(12.2), hgb 8(8), creat nl; cx- enterococcus/klebsiella (c/w infected hematoma); will discuss f/u imaging timetable with Dr. Loreta AveWagner as well as timing of eliquis restart   Electronically Signed: D. Jeananne RamaKevin Gisselle Galvis, PA-C 11/05/2018, 11:22 AM   I spent a  total of 15 minutes at the the patient's bedside AND on the patient's hospital floor or unit, greater than 50% of which was counseling/coordinating care for gallbladder fossa abscess drain    Patient ID: Calvin FantasiaLarry F Price, male   DOB: [birth year], 73 y.o.   MRN: [id number]

## 2018-11-05 NOTE — Progress Notes (Signed)
Physical Therapy Treatment Patient Details Name: [name] MRN: [id number] DOB: Apr 21, 1943 Today's Date: 11/05/2018    History of Present Illness KASSIUS BATTISTE is a 73 y.o. male with medical history significant of CAD status post CABG x4, type 2 diabetes, hypertension, hyperlipidemia, diverticulitis, liver abscess, cholecystitis status post percutaneous cholecystostomy tube 1/12 and eventually lap chole on 6/9 presented to [place] ED with complaints of abdominal pain..  S/P drain placement for old hematoma.    PT Comments    Pt still noticeably weak and deconditioned, but able to ambulate and negotiate steps with rail and no assist respectively.    Follow Up Recommendations  Home health PT;Other (comment)     Equipment Recommendations  None recommended by PT    Recommendations for Other Services       Precautions / Restrictions Precautions Precautions: Fall Restrictions Weight Bearing Restrictions: No    Mobility  Bed Mobility Overal bed mobility: Modified Independent             General bed mobility comments: pt in chair  Transfers Overall transfer level: Needs assistance   Transfers: Sit to/from Stand Sit to Stand: Supervision;Modified independent (Device/Increase time)(depending on height or if there are rails.)            Ambulation/Gait Ambulation/Gait assistance: Min guard Gait Distance (Feet): 250 Feet Assistive device: IV Pole;None Gait Pattern/deviations: Step-through pattern Gait velocity: slower   General Gait Details: still guarded by steady.  sats holding in the low to mid 90's.   Stairs Stairs: Yes Stairs assistance: Supervision Stair Management: One rail Left;Step to pattern;Forwards;Backwards Number of Stairs: 6(complete on just 1 step.) General stair comments: safe whether step to or alternating   Wheelchair Mobility    Modified Rankin (Stroke Patients Only)       Balance Overall balance assessment:  Needs assistance   Sitting balance-Leahy Scale: Good       Standing balance-Leahy Scale: Fair                              Cognition Arousal/Alertness: Awake/alert Behavior During Therapy: WFL for tasks assessed/performed Overall Cognitive Status: Within Functional Limits for tasks assessed                                        Exercises      General Comments        Pertinent Vitals/Pain Pain Assessment: No/denies pain    Home Living                      Prior Function            PT Goals (current goals can now be found in the care plan section) Acute Rehab PT Goals Patient Stated Goal: back to playing golf. PT Goal Formulation: With patient Time For Goal Achievement: 11/18/18 Potential to Achieve Goals: Good Progress towards PT goals: Progressing toward goals    Frequency    Min 3X/week      PT Plan      Co-evaluation              AM-PAC PT "6 Clicks" Mobility   Outcome Measure  Help needed turning from your back to your side while in a flat bed without using bedrails?: None Help needed moving from lying on your back to  sitting on the side of a flat bed without using bedrails?: None Help needed moving to and from a bed to a chair (including a wheelchair)?: None Help needed standing up from a chair using your arms (e.g., wheelchair or bedside chair)?: None Help needed to walk in hospital room?: A Little Help needed climbing 3-5 steps with a railing? : A Little 6 Click Score: 22    End of Session   Activity Tolerance: Patient tolerated treatment well Patient left: in chair;with call bell/phone within reach Nurse Communication: Mobility status PT Visit Diagnosis: History of falling (Z91.81);Other abnormalities of gait and mobility (R26.89)     Time: 1431-1455 PT Time Calculation (min) (ACUTE ONLY): 24 min  Charges:  $Gait Training: 8-22 mins $Therapeutic Activity: 8-22 mins                      11/05/2018  [place] BingKen Mayara Paulson, PT Acute Rehabilitation Services [phone]  (pager) [phone]  (office)   Eliseo GumKenneth V Jla Reynolds 11/05/2018, 3:05 PM

## 2018-11-05 NOTE — Progress Notes (Signed)
ANTICOAGULATION CONSULT NOTE - Initial Consult  Pharmacy Consult for apixaban Indication: atrial fibrillation  Allergies  Allergen Reactions  . Ciprofloxacin Rash  . Erythromycin Ethylsuccinate Rash    Patient Measurements: Height: 5\' 11"  (180.3 cm) Weight: 234 lb 4.8 oz (106.3 kg) IBW/kg (Calculated) : 75.3   Vital Signs: Temp: 97.9 F (36.6 C) (07/08 0808) Temp Source: Oral (07/08 0808) BP: 137/84 (07/08 0808) Pulse Rate: 88 (07/08 0808)  Labs: Recent Labs    11/03/18 0323 11/03/18 1459 11/04/18 0259 11/05/18 0421  HGB 7.2* 6.4* 8.0* 8.0*  HCT 23.0* 20.6* 24.6* 24.7*  PLT 223 196 234 228  CREATININE 1.31*  --  1.11 1.13    Estimated Creatinine Clearance: 69 mL/min (by C-G formula based on SCr of 1.13 mg/dL).   Medical History: Past Medical History:  Diagnosis Date  . Allergies   . Anxiety   . CAD (coronary artery disease)   . Chest pain on exertion   . Diabetes ([place])   . Dyspnea    upon exertion  . Gout   . Hepatitis    at 73 years old  . Hyperlipemia   . Hypertension      Assessment: 73 yo male with aflutter on apixaban PTA. Anticoagulation has been on hold for gallbladderabscess s/p drain placement on 11/01/18. Pharmacy consulted to restart therapy -hg= 8.0 (noted 6.4 on 7/6), plt= 228 -CrCl ~ 70  Goal of Therapy:  Monitor platelets by anticoagulation protocol: Yes   Plan:  Restart apixaban 5mg  po bid (first dose tonight) Will sign off. Please contact pharmacy with any other needs.  Thank you  Hildred Laser, PharmD Clinical Pharmacist **Pharmacist phone directory can now be found on [email or web address] ([email or web address]).  Listed under [place].

## 2018-11-05 NOTE — TOC Initial Note (Signed)
Transition of Care [facility]) - Initial/Assessment Note    Patient Details  Name: [name] MRN: [id number] Date of Birth: 11/13/1942  Transition of Care [facility]) CM/SW Contact:    Carles Collet, RN Phone Number: 11/05/2018, 12:37 PM  Clinical Narrative:         Patient provided with [facility] choice list. Is calling friend to see who he previously had, CM will follow up.       Expected Discharge Plan: [place] Barriers to Discharge: Continued Medical Work up   Patient Goals and CMS Choice   CMS Medicare.gov Compare Post Acute Care list provided to:: Patient Choice offered to / list presented to : Patient  Expected Discharge Plan and Services Expected Discharge Plan: [place]         Expected Discharge Date: 11/05/18                                    Prior Living Arrangements/Services                       Activities of Daily Living Home Assistive Devices/Equipment: CBG Meter ADL Screening (condition at time of admission) Patient's cognitive ability adequate to safely complete daily activities?: Yes Is the patient deaf or have difficulty hearing?: No Does the patient have difficulty seeing, even when wearing glasses/contacts?: No Does the patient have difficulty concentrating, remembering, or making decisions?: No Patient able to express need for assistance with ADLs?: Yes Does the patient have difficulty dressing or bathing?: No Independently performs ADLs?: Yes (appropriate for developmental age) Does the patient have difficulty walking or climbing stairs?: No Weakness of Legs: None Weakness of Arms/Hands: None  Permission Sought/Granted                  Emotional Assessment              Admission diagnosis:  Postoperative abscess [T81.49XA] Sepsis, due to unspecified organism, unspecified whether acute organ dysfunction present [facility]) [A41.9] Patient Active Problem List   Diagnosis Date Noted  . AKI  (acute kidney injury) ([place]) 11/01/2018  . RBBB 11/01/2018  . Abscess, intra-abdominal, postoperative 10/31/2018  . Acute gangrenous cholecystitis 05/12/2018  . Protein-calorie malnutrition, severe 05/10/2018  . Liver abscess 05/09/2018  . Acute diverticulitis 05/09/2018  . Atrial flutter ([place]) 05/01/2018  . Anemia 05/01/2018  . Non-insulin dependent type 2 diabetes mellitus ([place]) 05/01/2018  . Coronary artery disease 03/31/2018  . Hx of CABG   . Essential hypertension 02/25/2018  . Hyperlipidemia 02/25/2018   PCP:  Guadlupe Spanish, MD Pharmacy:   [place], [place] - [address] [place] [address] Phone: [phone] Fax: [phone]     Social Determinants of Health (SDOH) Interventions    Readmission Risk Interventions No flowsheet data found.

## 2018-11-05 NOTE — Evaluation (Signed)
Occupational Therapy Evaluation Patient Details Name: [name] MRN: [id number] DOB: 1942-05-17 Today's Calvin FantasiaDate: 11/05/2018    History of Present Illness Calvin FantasiaLarry F Price is a 73 y.o. male with medical history significant of CAD status post CABG x4, type 2 diabetes, hypertension, hyperlipidemia, diverticulitis, liver abscess, cholecystitis status post percutaneous cholecystostomy tube 1/12 and eventually lap chole on 6/9 [facility] with complaints of abdominal pain..  S/P drain placement for old hematoma.   Clinical Impression   Pt was functioning modified independently prior to this admission. He is familiar to this therapist from previous admission for CABG and reports he was not able to participate in cardiac rehab and has remained deconditioned. Pt presents with generalized weakness and mild balance impairment requiring stabilization of IV pole with ambulation (pt typically uses a cane). He fatigues easily. Will follow acutely.    Follow Up Recommendations  Home health OT    Equipment Recommendations  None recommended by OT    Recommendations for Other Services       Precautions / Restrictions Precautions Precautions: Fall Restrictions Weight Bearing Restrictions: No      Mobility Bed Mobility               General bed mobility comments: pt in chair  Transfers Overall transfer level: Needs assistance Equipment used: (pushed IV pole) Transfers: Sit to/from Stand Sit to Stand: Supervision              Balance                                           ADL either performed or assessed with clinical judgement   ADL Overall ADL's : Needs assistance/impaired Eating/Feeding: Independent;Sitting   Grooming: Wash/dry hands;Standing;Min guard   Upper Body Bathing: Minimal assistance;Sitting   Lower Body Bathing: Min guard;Sit to/from stand   Upper Body Dressing : Set up;Sitting   Lower Body Dressing: Min  guard;Sit to/from stand   Toilet Transfer: Min guard;Ambulation   Toileting- Clothing Manipulation and Hygiene: Min guard;Sit to/from stand       Functional mobility during ADLs: Min guard(pushed IV pole) General ADL Comments: pt able to cross foot over opposite knee     Vision Patient Visual Report: No change from baseline       Perception     Praxis      Pertinent Vitals/Pain Pain Assessment: No/denies pain     Hand Dominance Right   Extremity/Trunk Assessment Upper Extremity Assessment Upper Extremity Assessment: Overall WFL for tasks assessed   Lower Extremity Assessment Lower Extremity Assessment: Defer to PT evaluation       Communication Communication Communication: No difficulties   Cognition Arousal/Alertness: Awake/alert Behavior During Therapy: WFL for tasks assessed/performed Overall Cognitive Status: Within Functional Limits for tasks assessed                                     General Comments       Exercises     Shoulder Instructions      Home Living Family/patient expects to be discharged to:: Private residence Living Arrangements: Alone Available Help at Discharge: Family;Available PRN/intermittently Type of Home: Other(Comment) Home Access: Stairs to enter Entrance Stairs-Number of Steps: 2 Entrance Stairs-Rails: Right Home Layout: Laundry or work area in basement;Able to  live on main level with bedroom/bathroom     Bathroom Shower/Tub: Occupational psychologist: Standard     Home Equipment: Environmental consultant - 2 wheels;Cane - single point;Wheelchair - manual          Prior Functioning/Environment Level of Independence: Independent        Comments: enjoys golfing, has been sedentary since October 2019        OT Problem List: Decreased strength;Impaired balance (sitting and/or standing);Decreased activity tolerance      OT Treatment/Interventions: Self-care/ADL training;DME and/or AE instruction;Balance  training;Patient/family education    OT Goals(Current goals can be found in the care plan section) Acute Rehab OT Goals Patient Stated Goal: back to playing golf. OT Goal Formulation: With patient Time For Goal Achievement: 11/19/18 Potential to Achieve Goals: Good ADL Goals Pt Will Perform Lower Body Bathing: (P) Independently;sit to/from stand Pt Will Perform Lower Body Dressing: (P) Independently;sit to/from stand Pt Will Transfer to Toilet: (P) Independently;ambulating Pt Will Perform Toileting - Clothing Manipulation and hygiene: (P) Independently;sit to/from stand Pt Will Perform Tub/Shower Transfer: (P) Shower transfer;with supervision;ambulating  OT Frequency: Min 2X/week   Barriers to D/C:            Co-evaluation              AM-PAC OT "6 Clicks" Daily Activity     Outcome Measure Help from another person eating meals?: None Help from another person taking care of personal grooming?: A Little Help from another person toileting, which includes using toliet, bedpan, or urinal?: A Little Help from another person bathing (including washing, rinsing, drying)?: A Little Help from another person to put on and taking off regular upper body clothing?: None Help from another person to put on and taking off regular lower body clothing?: A Little 6 Click Score: 20   End of Session Equipment Utilized During Treatment: Rolling walker;Gait belt  Activity Tolerance: Patient tolerated treatment well Patient left: in chair;with call bell/phone within reach;with chair alarm set  OT Visit Diagnosis: Other abnormalities of gait and mobility (R26.89);Muscle weakness (generalized) (M62.81);Other (comment)(decreased activity tolerance)                Time: 2947-6546 OT Time Calculation (min): 27 min Charges:  OT General Charges $OT Visit: 1 Visit OT Evaluation $OT Eval Moderate Complexity: 1 Mod OT Treatments $Self Care/Home Management : 8-22 mins  Nestor Lewandowsky, OTR/L Acute  Rehabilitation Services Pager: [phone] Office: [phone]  Malka So 11/05/2018, 9:48 AM

## 2018-11-05 NOTE — Progress Notes (Signed)
   Subjective/Chief Complaint: No acute change.    Objective: Vital signs in last 24 hours: Temp:  [97.5 F (36.4 C)-98.3 F (36.8 C)] 97.9 F (36.6 C) (07/08 0808) Pulse Rate:  [88-95] 88 (07/08 0808) Resp:  [9-20] 18 (07/08 0808) BP: (137-165)/(76-86) 137/84 (07/08 0808) SpO2:  [96 %-100 %] 99 % (07/08 0808) Last BM Date: 11/05/18  Intake/Output from previous day: 07/07 0701 - 07/08 0700 In: 465 [P.O.:340; I.V.:5; IV Piggyback:100] Out: 203 [Urine:200; Drains:3] Intake/Output this shift: No intake/output data recorded.  Exam: Awake and alert Abdomen soft, minimally tender, drain looks like old hematoma, fluid in tubing is thin ss- only 3 output recorded but there is about 15 in the bulb.   Lab Results:  Recent Labs    11/04/18 0259 11/05/18 0421  WBC 12.2* 11.2*  HGB 8.0* 8.0*  HCT 24.6* 24.7*  PLT 234 228   BMET Recent Labs    11/04/18 0259 11/05/18 0421  NA 131* 130*  K 3.9 3.4*  CL 98 99  CO2 22 22  GLUCOSE 151* 149*  BUN 8 8  CREATININE 1.11 1.13  CALCIUM 8.8* 8.7*   PT/INR No results for input(s): LABPROT, INR in the last 72 hours. ABG No results for input(s): PHART, HCO3 in the last 72 hours.  Invalid input(s): PCO2, PO2  Studies/Results: No results found.  Anti-infectives: Anti-infectives (From admission, onward)   Start     Dose/Rate Route Frequency Ordered Stop   11/01/18 0600  ceFEPIme (MAXIPIME) 1 g in sodium chloride 0.9 % 100 mL IVPB  Status:  Discontinued     1 g 200 mL/hr over 30 Minutes Intravenous Every 8 hours 10/31/18 1957 11/01/18 0256   11/01/18 0400  piperacillin-tazobactam (ZOSYN) IVPB 3.375 g     3.375 g 12.5 mL/hr over 240 Minutes Intravenous Every 8 hours 11/01/18 0258     10/31/18 1944  ceFEPIme (MAXIPIME) 2 g injection    Note to Pharmacy: Daine Gravel   : cabinet override      10/31/18 1944 11/01/18 0759   10/31/18 1915  ceFEPIme (MAXIPIME) 2 g in sodium chloride 0.9 % 100 mL IVPB     2 g 200 mL/hr over 30  Minutes Intravenous  Once 10/31/18 1911 10/31/18 2052   10/31/18 1915  metroNIDAZOLE (FLAGYL) IVPB 500 mg     500 mg 100 mL/hr over 60 Minutes Intravenous  Once 10/31/18 1911 10/31/18 2100      Assessment/Plan:  Gallbladder fossa abscess  S/p IR drain that looks like old infected hematoma. Continue to monitor for signs of bile leak. Awaiting cultures- can transition to PO abx  Ok to advance po from surgery standpoint, ok to progress toward discharge when medically ready. Working with PT   LOS: 5 days    Clovis Riley 11/05/2018

## 2018-11-06 ENCOUNTER — Encounter

## 2018-11-06 ENCOUNTER — Inpatient Hospital Stay (HOSPITAL_COMMUNITY): Payer: Medicare HMO

## 2018-11-06 LAB — COMPREHENSIVE METABOLIC PANEL
ALT: 16 U/L (ref 0–44)
AST: 18 U/L (ref 15–41)
Albumin: 2.3 g/dL — ABNORMAL LOW (ref 3.5–5.0)
Alkaline Phosphatase: 59 U/L (ref 38–126)
Anion gap: 10 (ref 5–15)
BUN: 7 mg/dL — ABNORMAL LOW (ref 8–23)
CO2: 24 mmol/L (ref 22–32)
Calcium: 8.9 mg/dL (ref 8.9–10.3)
Chloride: 97 mmol/L — ABNORMAL LOW (ref 98–111)
Creatinine, Ser: 1.17 mg/dL (ref 0.61–1.24)
GFR calc Af Amer: >60 mL/min (ref >60)
GFR calc non Af Amer: >60 mL/min (ref >60)
Glucose, Bld: 129 mg/dL — ABNORMAL HIGH (ref 70–99)
Potassium: 4 mmol/L (ref 3.5–5.1)
Sodium: 131 mmol/L — ABNORMAL LOW (ref 135–145)
Total Bilirubin: 0.6 mg/dL (ref 0.3–1.2)
Total Protein: 5.6 g/dL — ABNORMAL LOW (ref 6.5–8.1)

## 2018-11-06 LAB — GLUCOSE, CAPILLARY
Glucose-Capillary: 139 mg/dL — ABNORMAL HIGH (ref 70–99)
Glucose-Capillary: 149 mg/dL — ABNORMAL HIGH (ref 70–99)
Glucose-Capillary: 153 mg/dL — ABNORMAL HIGH (ref 70–99)
Glucose-Capillary: 158 mg/dL — ABNORMAL HIGH (ref 70–99)

## 2018-11-06 LAB — CBC
HCT: 24.5 % — ABNORMAL LOW (ref 39.0–52.0)
Hemoglobin: 8 g/dL — ABNORMAL LOW (ref 13.0–17.0)
MCH: 27.6 pg (ref 26.0–34.0)
MCHC: 32.7 g/dL (ref 30.0–36.0)
MCV: 84.5 fL (ref 80.0–100.0)
Platelets: 322 K/uL (ref 150–400)
RBC: 2.9 MIL/uL — ABNORMAL LOW (ref 4.22–5.81)
RDW: 16.6 % — ABNORMAL HIGH (ref 11.5–15.5)
WBC: 10.3 K/uL (ref 4.0–10.5)
nRBC: 0.2 % (ref 0.0–0.2)

## 2018-11-06 LAB — AEROBIC/ANAEROBIC CULTURE W GRAM STAIN (SURGICAL/DEEP WOUND): Special Requests: NORMAL

## 2018-11-06 MED ORDER — BISACODYL 10 MG RE SUPP
10.0000 mg | Freq: Once | RECTAL | Status: DC
Start: 2018-11-06 — End: 2018-11-08

## 2018-11-06 MED ORDER — POLYETHYLENE GLYCOL 3350 17 G PO PACK
17.0000 g | Freq: Every day | ORAL | Status: DC | PRN
Start: 2018-11-06 — End: 2018-11-09

## 2018-11-06 MED ORDER — SULFAMETHOXAZOLE-TRIMETHOPRIM 800-160 MG PO TABS
1.0000 | Freq: Two times a day (BID) | ORAL | Status: DC
Start: 2018-11-06 — End: 2018-11-12
  Administered 2018-11-06 – 2018-11-12 (×13): 1 via ORAL
  Filled 2018-11-06 (×13): qty 1

## 2018-11-06 MED ORDER — AMOXICILLIN-POT CLAVULANATE 875-125 MG PO TABS
1.0000 | Freq: Two times a day (BID) | ORAL | Status: DC
Start: 2018-11-06 — End: 2018-11-12
  Administered 2018-11-06 – 2018-11-12 (×13): 1 via ORAL
  Filled 2018-11-06 (×13): qty 1

## 2018-11-06 MED ORDER — SENNOSIDES-DOCUSATE SODIUM 8.6-50 MG PO TABS
2.0000 | Freq: Every evening | ORAL | Status: DC | PRN
Start: 2018-11-06 — End: 2018-11-12
  Administered 2018-11-06: 2 via ORAL
  Filled 2018-11-06: qty 2

## 2018-11-06 MED ORDER — POLYETHYLENE GLYCOL 3350 17 G PO PACK
17.0000 g | Freq: Every day | ORAL | Status: DC
Start: 2018-11-06 — End: 2018-11-12
  Administered 2018-11-06 – 2018-11-09 (×4): 17 g via ORAL
  Filled 2018-11-06 (×4): qty 1

## 2018-11-06 NOTE — Plan of Care (Signed)
  Problem: Pain Managment: Goal: General experience of comfort will improve Outcome: Progressing   Problem: Safety: Goal: Ability to remain free from injury will improve Outcome: Progressing   

## 2018-11-06 NOTE — Progress Notes (Signed)
Referring Physician(s): Byerly,F  Supervising Physician: Markus Daft  Patient Status:  [facility] - In-pt  Chief Complaint:  Abdominal pain, gallbladder fossa abscess  Subjective: Pt has ambulated today with PT; still c/o fatigue/weakness and has primarily lower abd discomfort with some soreness at RUQ drain site; occ nausea; no sig BM; no dysuria   Allergies: Ciprofloxacin and Erythromycin ethylsuccinate  Medications: Prior to Admission medications   Medication Sig Start Date End Date Taking? Authorizing Provider  acetaminophen (TYLENOL) 500 MG tablet Take 1,000 mg by mouth daily as needed for moderate pain.   Yes [provider]  amiodarone (PACERONE) 200 MG tablet TAKE 1 TABLET BY MOUTH DAILY Patient taking differently: Take 200 mg by mouth daily.  07/04/18  Yes Lorretta Harp, MD  amLODipine (NORVASC) 5 MG tablet Take 5 mg by mouth daily. 02/05/18  Yes [provider]  aspirin EC 81 MG tablet Take 1 tablet (81 mg total) by mouth daily. Patient taking differently: Take 81 mg by mouth every evening.  05/01/18  Yes Kilroy, Doreene Burke, PA-C  Cholecalciferol (VITAMIN D3) 2000 units TABS Take 2,000 Units by mouth 2 (two) times daily.    Yes [provider]  docusate sodium (COLACE) 100 MG capsule Take 100 mg by mouth 2 (two) times daily.    Yes [provider]  ELIQUIS 5 MG TABS tablet TAKE ONE TABLET BY MOUTH TWICE DAILY 06/03/18  Yes Lorretta Harp, MD  finasteride (PROSCAR) 5 MG tablet Take 5 mg by mouth daily. 02/05/18  Yes [provider]  loratadine (CLARITIN) 10 MG tablet Take 10 mg by mouth daily.   Yes [provider]  metFORMIN (GLUCOPHAGE) 1000 MG tablet Take 1,000 mg by mouth 2 (two) times daily. 02/05/18  Yes [provider]  metoprolol tartrate (LOPRESSOR) 25 MG tablet Take 1 tablet (25 mg total) by mouth 2 (two) times daily. 05/20/18  Yes Lorretta Harp, MD  Multiple Vitamins-Minerals (MULTIVITAMIN ADULT PO) Take 1  tablet by mouth daily.   Yes [provider]  oxyCODONE (OXY IR/ROXICODONE) 5 MG immediate release tablet Take 1 tablet (5 mg total) by mouth every 6 (six) hours as needed for severe pain. Patient not taking: Reported on 11/01/2018 10/07/18   [name], MD     Vital Signs: BP (!) 162/82 (BP Location: Right Arm)   Pulse 90   Temp 97.8 F (36.6 C) (Oral)   Resp 12   Ht 5\' 11"  (1.803 m)   Wt 234 lb 4.8 oz (106.3 kg)   SpO2 97%   BMI 32.68 kg/m   Physical Exam awake/alert; appears tired; abd soft, RUQ drain intact, insertion site ok, mildly tender, output 40 cc dark bloody fluid  Imaging: No results found.  Labs:  CBC: Recent Labs    11/03/18 1459 11/04/18 0259 11/05/18 0421 11/06/18 0356  WBC 10.4 12.2* 11.2* 10.3  HGB 6.4* 8.0* 8.0* 8.0*  HCT 20.6* 24.6* 24.7* 24.5*  PLT 196 234 228 322    COAGS: Recent Labs    03/14/18 1610 03/26/18 1146 03/31/18 1458 04/08/18 0238 05/10/18 0907 10/06/18 0903  INR 0.96 0.96 1.37 1.06 1.42 0.9  APTT 32 31 29  --   --   --     BMP: Recent Labs    11/03/18 0323 11/04/18 0259 11/05/18 0421 11/06/18 0356  NA 133* 131* 130* 131*  K 3.4* 3.9 3.4* 4.0  CL 98 98 99 97*  CO2 22 22 22 24   GLUCOSE 132*  151* 149* 129*  BUN 12 8 8  7*  CALCIUM 8.6* 8.8* 8.7* 8.9  CREATININE 1.31* 1.11 1.13 1.17  GFRNONAA 53* >60 >60 >60  GFRAA >60 >60 >60 >60    LIVER FUNCTION TESTS: Recent Labs    11/03/18 0323 11/04/18 0259 11/05/18 0421 11/06/18 0356  BILITOT 0.6 0.6 0.7 0.6  AST 22 19 18 18   ALT 22 18 15 16   ALKPHOS 64 69 64 59  PROT 5.3* 5.4* 5.3* 5.6*  ALBUMIN 2.3* 2.3* 2.2* 2.3*    Assessment and Plan: Pt s/p cholecystectomy 6/9 with subsequent GB fossa abscess/infected hematoma, s/p drain placement 7/4; afebrile;WBC nl; hgb 8(8), creat nl; abd film ordered by IM pend; for f/u CT tomorrow   Electronically Signed: D. Jeananne RamaKevin Allred, PA-C 11/06/2018, 11:10 AM   I spent a total of 15 minutes at the the patient's  bedside AND on the patient's hospital floor or unit, greater than 50% of which was counseling/coordinating care for gallbladder fossa abscess drain    Patient ID: [name], male   DOB: [birth year], 73 y.o.   MRN: [id number]

## 2018-11-06 NOTE — TOC Progression Note (Signed)
Transition of Care [facility]) - Progression Note    Patient Details  Name: KENSTON LONGTON MRN: [id number] Date of Birth: February 10, 1943  [facility] CM/SW Contact  Carles Collet, RN Phone Number: 11/06/2018, 2:34 PM  Clinical Narrative:   Spoke w patient at bedside again. He states that would like to use [facility]. Referral accepted by Butch Penny. He states that he has RW and WC all needed DME at home already. No other CM needs identified at this time.     Expected Discharge Plan: [place] Barriers to Discharge: Continued Medical Work up  Expected Discharge Plan and Services Expected Discharge Plan: [place]   Discharge Planning Services: CM Consult Post Acute Care Choice: [place] arrangements for the past 2 months: Single Family Home Expected Discharge Date: 11/05/18               DME Arranged: N/A         HH Arranged: PT, OT, RN, Nurse's Aide HH Agency: [place] ([place]) Date HH Agency Contacted: 11/06/18 Time [place]: [place] Representative spoke with at [place]: [place] ([place]) Interventions    Readmission Risk Interventions No flowsheet data found.

## 2018-11-06 NOTE — Progress Notes (Signed)
Patient's daughter updated on patient status.

## 2018-11-06 NOTE — Progress Notes (Signed)
Physical Therapy Treatment Patient Details Name: Calvin FantasiaLarry F Kope MRN: [id number] DOB: December 11, 1942 Today's Date: 11/06/2018    History of Present Illness [name] is a 73 y.o. male with medical history significant of CAD status post CABG x4, type 2 diabetes, hypertension, hyperlipidemia, diverticulitis, liver abscess, cholecystitis status post percutaneous cholecystostomy tube 1/12 and eventually lap chole on 6/9 [facility] with complaints of abdominal pain..  S/P drain placement for old hematoma.    PT Comments    Patient seen for mobility progression. Patient reports general fatigue limiting mobility - reports low appetite. Motivated for OOB mobility. Required use of RW today for mobility with multiple standing rest breaks - reports fearful of falling today. PT to continue to follow to progress mobility as patient tolerates.      Follow Up Recommendations  Home health PT;Other (comment)(working towards CRP2)     Equipment Recommendations  Rolling walker with 5" wheels    Recommendations for Other Services       Precautions / Restrictions Precautions Precautions: Fall Restrictions Weight Bearing Restrictions: No    Mobility  Bed Mobility Overal bed mobility: Modified Independent                Transfers Overall transfer level: Needs assistance Equipment used: Rolling walker (2 wheeled) Transfers: Sit to/from Stand Sit to Stand: Supervision         General transfer comment: for safety and immediate standing balance  Ambulation/Gait Ambulation/Gait assistance: Min guard Gait Distance (Feet): 220 Feet Assistive device: Rolling walker (2 wheeled) Gait Pattern/deviations: Step-through pattern;Decreased stride length;Trunk flexed Gait velocity: decreased   General Gait Details: slow steady pace of gait; patient reports increased fatigue with mobility. requires multiple standing rest breaks due to fatigue   Stairs              Wheelchair Mobility    Modified Rankin (Stroke Patients Only)       Balance Overall balance assessment: Needs assistance Sitting-balance support: No upper extremity supported;Feet supported Sitting balance-Leahy Scale: Good     Standing balance support: Bilateral upper extremity supported;During functional activity Standing balance-Leahy Scale: Fair Standing balance comment: required RW today due to fatigue with patient stating he is fearful of falling                            Cognition Arousal/Alertness: Awake/alert Behavior During Therapy: WFL for tasks assessed/performed Overall Cognitive Status: Within Functional Limits for tasks assessed                                        Exercises      General Comments        Pertinent Vitals/Pain Pain Assessment: Faces Faces Pain Scale: Hurts little more Pain Location: generalized discomfort Pain Descriptors / Indicators: Discomfort Pain Intervention(s): Limited activity within patient's tolerance;Monitored during session;Repositioned    Home Living                      Prior Function            PT Goals (current goals can now be found in the care plan section) Acute Rehab PT Goals Patient Stated Goal: back to playing golf. PT Goal Formulation: With patient Time For Goal Achievement: 11/18/18 Potential to Achieve Goals: Good Progress towards PT goals: Progressing toward goals  Frequency    Min 3X/week      PT Plan Current plan remains appropriate    Co-evaluation              AM-PAC PT "6 Clicks" Mobility   Outcome Measure  Help needed turning from your back to your side while in a flat bed without using bedrails?: None Help needed moving from lying on your back to sitting on the side of a flat bed without using bedrails?: None Help needed moving to and from a bed to a chair (including a wheelchair)?: A Little Help needed standing up from a chair  using your arms (e.g., wheelchair or bedside chair)?: A Little Help needed to walk in hospital room?: A Little Help needed climbing 3-5 steps with a railing? : A Lot 6 Click Score: 19    End of Session Equipment Utilized During Treatment: Gait belt Activity Tolerance: Patient tolerated treatment well;Patient limited by fatigue Patient left: in bed;with call bell/phone within reach Nurse Communication: Mobility status PT Visit Diagnosis: History of falling (Z91.81);Other abnormalities of gait and mobility (R26.89)     Time: 1020-1037 PT Time Calculation (min) (ACUTE ONLY): 17 min  Charges:  $Gait Training: 8-22 mins                      Lanney Gins, PT, DPT Supplemental Physical Therapist 11/06/18 10:50 AM Pager: [phone] Office: [phone]

## 2018-11-06 NOTE — Progress Notes (Addendum)
[facility]/Trauma Progress Note      Assessment/Plan Gallbladder fossa abscess, admission 07/04, S/P lap chole 10/07/18  - S/p IR drain that looks like old infected hematoma. - Continue to monitor for signs of bile leak. - cultures showed Klebsiella and enterococcus - can transition to PO abx  - ok to progress toward discharge when medically ready. Working with PT - Drain per IR, CT scan tomorrow  FEN: heart healthy, carb mod, added miralax VTE: SCD's, Eliquis ID: Zosyn 07/04>> Follow up: Dr. Barry Dienes   LOS: 6 days    Subjective: CC: abdominal pain  LLQ abdominal pain and RUQ pain. He states not flatus or BM. Nurse states small BM this am. He has no appetite and is not interested in any food. Medicine ordered a AXR 2/2 abdominal pain.   Objective: Vital signs in last 24 hours: Temp:  [97.3 F (36.3 C)-98 F (36.7 C)] 97.8 F (36.6 C) (07/09 0831) Pulse Rate:  [81-108] 90 (07/09 0508) Resp:  [10-22] 12 (07/09 0831) BP: (132-168)/(74-90) 162/82 (07/09 0831) SpO2:  [94 %-97 %] 97 % (07/09 0508) Last BM Date: 11/05/18  Intake/Output from previous day: 07/08 0701 - 07/09 0700 In: 677.5 [P.O.:515; IV Piggyback:152.5] Out: 40.5 [Drains:40.5] Intake/Output this shift: No intake/output data recorded.  PE: Gen:  Alert, NAD Pulm:  Rate and effort normal Abd: Soft, obese, ND, +BS, lap chole incisions are well healed, drain with minimal sanguinous drainage, TTP of LLQ and RUQ without guarding. No peritonitis  Skin:  warm and dry   Anti-infectives: Anti-infectives (From admission, onward)   Start     Dose/Rate Route Frequency Ordered Stop   11/01/18 0600  ceFEPIme (MAXIPIME) 1 g in sodium chloride 0.9 % 100 mL IVPB  Status:  Discontinued     1 g 200 mL/hr over 30 Minutes Intravenous Every 8 hours 10/31/18 1957 11/01/18 0256   11/01/18 0400  piperacillin-tazobactam (ZOSYN) IVPB 3.375 g     3.375 g 12.5 mL/hr over 240 Minutes Intravenous Every 8 hours 11/01/18  0258     10/31/18 1944  ceFEPIme (MAXIPIME) 2 g injection    Note to Pharmacy: Daine Gravel   : cabinet override      10/31/18 1944 11/01/18 0759   10/31/18 1915  ceFEPIme (MAXIPIME) 2 g in sodium chloride 0.9 % 100 mL IVPB     2 g 200 mL/hr over 30 Minutes Intravenous  Once 10/31/18 1911 10/31/18 2052   10/31/18 1915  metroNIDAZOLE (FLAGYL) IVPB 500 mg     500 mg 100 mL/hr over 60 Minutes Intravenous  Once 10/31/18 1911 10/31/18 2100      Lab Results:  Recent Labs    11/05/18 0421 11/06/18 0356  WBC 11.2* 10.3  HGB 8.0* 8.0*  HCT 24.7* 24.5*  PLT 228 322   BMET Recent Labs    11/05/18 0421 11/06/18 0356  NA 130* 131*  K 3.4* 4.0  CL 99 97*  CO2 22 24  GLUCOSE 149* 129*  BUN 8 7*  CREATININE 1.13 1.17  CALCIUM 8.7* 8.9   PT/INR No results for input(s): LABPROT, INR in the last 72 hours. CMP     Component Value Date/Time   NA 131 (L) 11/06/2018 0356   NA 132 (L) 05/01/2018 0932   K 4.0 11/06/2018 0356   CL 97 (L) 11/06/2018 0356   CO2 24 11/06/2018 0356   GLUCOSE 129 (H) 11/06/2018 0356   BUN 7 (L) 11/06/2018 0356   BUN 13 05/01/2018 0932  CREATININE 1.17 11/06/2018 0356   CALCIUM 8.9 11/06/2018 0356   PROT 5.6 (L) 11/06/2018 0356   ALBUMIN 2.3 (L) 11/06/2018 0356   AST 18 11/06/2018 0356   ALT 16 11/06/2018 0356   ALKPHOS 59 11/06/2018 0356   BILITOT 0.6 11/06/2018 0356   GFRNONAA >60 11/06/2018 0356   GFRAA >60 11/06/2018 0356   Lipase     Component Value Date/Time   LIPASE 19 11/04/2018 0259    Studies/Results: No results found.    Jerre SimonJessica L Focht , [facility] 11/06/2018, 9:10 AM  Pager: [phone] Mon-Wed, Friday 7:00am-4:30pm Thurs 7am-11:30am  Consults: [phone]

## 2018-11-06 NOTE — Plan of Care (Signed)
Continue to monitor

## 2018-11-06 NOTE — Progress Notes (Signed)
PROGRESS NOTE    [name]  ZOX:[id number] DOB: [birth year] DOA: 10/31/2018 PCP: Karle PlumberArvind, Moogali M, MD   Brief Narrative:  73 year old with a history of CAD status post CABG, diabetes mellitus type 2, essential hypertension, hyperlipidemia, liver abscess, diverticulitis, cholecystitis status post laparoscopic cholecystectomy came to the hospital with complains of abdominal pain.  Eventually was found to have 7 x 9.9X 9.4 cm fluid collection.  General surgery and iron were consulted.  Patient was started on antibiotics, IR drain was placed on 7//20.   Assessment & Plan:   Principal Problem:   Abscess, intra-abdominal, postoperative Active Problems:   Hx of CABG   Coronary artery disease   Non-insulin dependent type 2 diabetes mellitus (HCC)   AKI (acute kidney injury) (HCC)   RBBB  Abdominal pain secondary to gallbladder fossa abscess -Status post IR drain placement on 11/01/18. Follow up CT planned 7/10 -Blood culture-negative -Wound culture is growing Klebsiella and enterococcus.  We will transition patient from IV antibiotics to oral Bactrim and Augmentin twice daily. -Monitor her PO intake today.   Constipation with abdominal distention -We will order abdominal x-ray.  Bowel regimen.  Acute on chronic anemia -Hemoglobin is around 8.0.  Stable.  Continue to monitor this.  CAD status post CABG X4 December 2019 A fib/Flutter, controlled. S/p Bypass -Does not appear to be in any chest pain.  Appears comfortable from cardiac standpoint. Cleared by radiology to resume his Eliquis  Diabetes mellitus type 2 -Insulin sliding scale Accu-Chek.  PT/OT evaluation- rec home HH  DVT prophylaxis: SCDs  Code Status: Full code Family Communication: None at bedside Disposition Plan: Still very weak continue hospital stay for repeat imaging tomorrow.  In the meantime transition to oral antibiotics and monitor for progression towards discharge over next 24-48 hours  Consultants:    Surgery  Radiology  Procedures:   Drain placed 7/4  Antimicrobials:   Augmentin  Bactrim   Subjective: Tells me he feels weak and his belly feels distended this morning.  Still having lower abdominal and right upper quadrant abdominal pain but improved.  Able to ambulate with the nursing staff.  Review of Systems Otherwise negative except as per HPI, including: General = no fevers, chills, dizziness, malaise, fatigue HEENT/EYES = negative for pain, redness, loss of vision, double vision, blurred vision, loss of hearing, sore throat, hoarseness, dysphagia Cardiovascular= negative for chest pain, palpitation, murmurs, lower extremity swelling Respiratory/lungs= negative for shortness of breath, cough, hemoptysis, wheezing, mucus production Gastrointestinal= negative for nausea, vomiting, melena, hematemesis Genitourinary= negative for Dysuria, Hematuria, Change in Urinary Frequency MSK = Negative for arthralgia, myalgias, Back Pain, Joint swelling  Neurology= Negative for headache, seizures, numbness, tingling  Psychiatry= Negative for anxiety, depression, suicidal and homocidal ideation Allergy/Immunology= Medication/Food allergy as listed  Skin= Negative for Rash, lesions, ulcers, itching    Objective: Vitals:   11/05/18 2006 11/05/18 2355 11/06/18 0508 11/06/18 0831  BP: 132/74 (!) 145/87 (!) 155/83 (!) 162/82  Pulse: 83 81 90   Resp: 12 10 11 12   Temp: 97.8 F (36.6 C) (!) 97.5 F (36.4 C) 97.7 F (36.5 C) 97.8 F (36.6 C)  TempSrc: Oral Oral Oral Oral  SpO2: 96% 94% 97%   Weight:      Height:        Intake/Output Summary (Last 24 hours) at 11/06/2018 1109 Last data filed at 11/06/2018 0700 Gross per 24 hour  Intake 552.52 ml  Output 40.5 ml  Net 512.02 ml   [employer]  10/31/18 1720 11/01/18 0140  Weight: 105.7 kg 106.3 kg    Examination:  Constitutional: NAD, calm, comfortable Eyes: PERRL, lids and conjunctivae normal ENMT: Mucous membranes are  moist. Posterior pharynx clear of any exudate or lesions.Normal dentition.  Neck: normal, supple, no masses, no thyromegaly Respiratory: clear to auscultation bilaterally, no wheezing, no crackles. Normal respiratory effort. No accessory muscle use.  Cardiovascular: Regular rate and rhythm, no murmurs / rubs / gallops. No extremity edema. 2+ pedal pulses. No carotid bruits.  Abdomen: no tenderness, no masses palpated. No hepatosplenomegaly. Bowel sounds positive.  Abdomen is slightly distended Musculoskeletal: no clubbing / cyanosis. No joint deformity upper and lower extremities. Good ROM, no contractures. Normal muscle tone.  Skin: no rashes, lesions, ulcers. No induration Neurologic: CN 2-12 grossly intact. Sensation intact, DTR normal. Strength 4+/5 in all 4.  Psychiatric: Normal judgment and insight. Alert and oriented x 3. Normal mood.  Right upper quadrant drain noted with dark blood.  Data Reviewed:   CBC: Recent Labs  Lab 10/31/18 1836  11/02/18 1223 11/02/18 1542 11/03/18 0323 11/03/18 1459 11/04/18 0259 11/05/18 0421 11/06/18 0356  WBC 24.6*   < > 9.2 15.4* 12.4* 10.4 12.2* 11.2* 10.3  NEUTROABS 21.5*  --  8.5* 12.6*  --  8.9*  --   --   --   HGB 10.0*   < > 7.6* 7.5* 7.2* 6.4* 8.0* 8.0* 8.0*  HCT 32.5*   < > 24.3* 24.0* 23.0* 20.6* 24.6* 24.7* 24.5*  MCV 84.6   < > 84.4 83.0 83.0 82.1 83.1 83.2 84.5  PLT 430*   < > 220 226 223 196 234 228 322   < > = values in this interval not displayed.   Basic Metabolic Panel: Recent Labs  Lab 11/02/18 0252 11/03/18 0323 11/04/18 0259 11/05/18 0421 11/06/18 0356  NA 133* 133* 131* 130* 131*  K 3.8 3.4* 3.9 3.4* 4.0  CL 102 98 98 99 97*  CO2 21* 22 22 22 24   GLUCOSE 132* 132* 151* 149* 129*  BUN 12 12 8 8  7*  CREATININE 1.24 1.31* 1.11 1.13 1.17  CALCIUM 8.6* 8.6* 8.8* 8.7* 8.9   GFR: Estimated Creatinine Clearance: 66.6 mL/min (by C-G formula based on SCr of 1.17 mg/dL). Liver Function Tests: Recent Labs  Lab  11/02/18 0252 11/03/18 0323 11/04/18 0259 11/05/18 0421 11/06/18 0356  AST 14* 22 19 18 18   ALT 15 22 18 15 16   ALKPHOS 51 64 69 64 59  BILITOT 0.8 0.6 0.6 0.7 0.6  PROT 5.5* 5.3* 5.4* 5.3* 5.6*  ALBUMIN 2.4* 2.3* 2.3* 2.2* 2.3*   Recent Labs  Lab 10/31/18 1836 11/02/18 0252 11/03/18 0323 11/04/18 0259  LIPASE 26 24 21 19    No results for input(s): AMMONIA in the last 168 hours. Coagulation Profile: No results for input(s): INR, PROTIME in the last 168 hours. Cardiac Enzymes: No results for input(s): CKTOTAL, CKMB, CKMBINDEX, TROPONINI in the last 168 hours. BNP (last 3 results) No results for input(s): PROBNP in the last 8760 hours. HbA1C: No results for input(s): HGBA1C in the last 72 hours. CBG: Recent Labs  Lab 11/05/18 0614 11/05/18 1114 11/05/18 1640 11/05/18 2143 11/06/18 0625  GLUCAP 159* 156* 145* 155* 139*   Lipid Profile: No results for input(s): CHOL, HDL, LDLCALC, TRIG, CHOLHDL, LDLDIRECT in the last 72 hours. Thyroid Function Tests: No results for input(s): TSH, T4TOTAL, FREET4, T3FREE, THYROIDAB in the last 72 hours. Anemia Panel: No results for input(s): VITAMINB12, FOLATE, FERRITIN, TIBC, IRON,  RETICCTPCT in the last 72 hours. Sepsis Labs: Recent Labs  Lab 10/31/18 1939 10/31/18 2146 11/01/18 0516  LATICACIDVEN 3.6* 2.8* 1.5    Recent Results (from the past 240 hour(s))  Blood culture (routine x 2)     Status: None   Collection Time: 10/31/18  7:40 PM   Specimen: BLOOD  Result Value Ref Range Status   Specimen Description   Final    BLOOD LEFT HAND Performed at [facility], [address] [place], [place] [address]    Special Requests   Final    BOTTLES DRAWN AEROBIC ONLY Blood Culture results may not be optimal due to an inadequate volume of blood received in culture bottles Performed at [facility] [place], [address] [place], [place] [address]    Culture   Final    NO GROWTH 5 DAYS Performed at [facility], [address] [place], [place] [address]    Report Status 11/05/2018 FINAL  Final  SARS Coronavirus 2 (Hosp order,Performed in [facility] lab via [employer] ID)     Status: None   Collection Time: 10/31/18  8:52 PM   Specimen: Dry Nasal Swab (Abbott ID Now)  Result Value Ref Range Status   SARS Coronavirus 2 (Abbott ID Now) NEGATIVE NEGATIVE Final    Comment: (NOTE) SARS-CoV-2 target nucleic acids are NOT DETECTED. The SARS-CoV-2 RNA is generally detectable in upper and lower respiratory specimens during the acute phase of infection.  Negativeresults do not preclude SARS-CoV-2 infection, do not rule out coinfections with other pathogens, and should not be used as the  sole basis for treatment or other patient management decisions.  Negative results must be combined with clinical observations, patient history, and epidemiological information. The expected result is Negative. Fact Sheet for Patients: http://www.graves-ford.org/https://www.fda.gov/media/136524/download Fact Sheet for Healthcare Providers: EnviroConcern.sihttps://www.fda.gov/media/136523/download This test is not yet approved or cleared by the [place] FDA and  has been authorized for detection and/or diagnosis of SARS-CoV-2 by FDA under an Emergency Use Authorization (EUA).  This EUA will remain in effect (meaning this test can be used) for the duration of  the COVID19 declaration under Section 5 64(b)(1) of the Act, 21 U.S.C.  section [id number] 3(b)(1), unless the authorization is terminated or revoked sooner. Performed at [facility] [place] [facility], [address] [place], [place] [address]   Aerobic/Anaerobic Culture (surgical/deep wound)     Status: None (Preliminary result)   Collection Time: 11/01/18 11:33 AM   Specimen: Abdomen; Abscess  Result Value Ref Range Status   Specimen Description ABDOMEN  Final   Special Requests Normal  Final   Gram Stain   Final    MODERATE WBC PRESENT, PREDOMINANTLY PMN MODERATE GRAM NEGATIVE RODS FEW GRAM  POSITIVE COCCI IN PAIRS    Culture   Final    ABUNDANT KLEBSIELLA PNEUMONIAE ABUNDANT ENTEROCOCCUS FAECALIS ABUNDANT PREVOTELLA DENTICOLA BETA LACTAMASE POSITIVE Performed at [facility], [address] [place], [place] [address]    Report Status PENDING  Incomplete   Organism ID, Bacteria ENTEROCOCCUS FAECALIS  Final   Organism ID, Bacteria KLEBSIELLA PNEUMONIAE  Final      Susceptibility   Enterococcus faecalis - MIC*    AMPICILLIN <=2 SENSITIVE Sensitive     VANCOMYCIN 1 SENSITIVE Sensitive     GENTAMICIN SYNERGY SENSITIVE Sensitive     * ABUNDANT ENTEROCOCCUS FAECALIS   Klebsiella pneumoniae - MIC*    AMPICILLIN >=32 RESISTANT Resistant     CEFAZOLIN >=  64 RESISTANT Resistant     CEFEPIME <=1 SENSITIVE Sensitive     CEFTAZIDIME 4 SENSITIVE Sensitive     CEFTRIAXONE 4 SENSITIVE Sensitive     CIPROFLOXACIN <=0.25 SENSITIVE Sensitive     GENTAMICIN <=1 SENSITIVE Sensitive     IMIPENEM <=0.25 SENSITIVE Sensitive     TRIMETH/SULFA <=20 SENSITIVE Sensitive     AMPICILLIN/SULBACTAM >=32 RESISTANT Resistant     PIP/TAZO 8 SENSITIVE Sensitive     Extended ESBL NEGATIVE Sensitive     * ABUNDANT KLEBSIELLA PNEUMONIAE         Radiology Studies: No results found.      Scheduled Meds: . apixaban  5 mg Oral BID  . aspirin EC  81 mg Oral Daily  . docusate sodium  100 mg Oral BID  . finasteride  5 mg Oral Daily  . insulin aspart  0-9 Units Subcutaneous TID WC  . metoprolol tartrate  25 mg Oral BID  . polyethylene glycol  17 g Oral Daily  . sodium chloride flush  5 mL Intracatheter Q8H   Continuous Infusions: . piperacillin-tazobactam (ZOSYN)  IV 3.375 g (11/06/18 0648)     LOS: 6 days   Time spent= 25 mins    Latrelle Fuston Arsenio Loader, MD [facility]  If 7PM-7AM, please contact night-coverage [email or web address] 11/06/2018, 11:09 AM

## 2018-11-07 ENCOUNTER — Inpatient Hospital Stay (HOSPITAL_COMMUNITY): Payer: Medicare HMO

## 2018-11-07 ENCOUNTER — Encounter

## 2018-11-07 LAB — CBC
HCT: 25.9 % — ABNORMAL LOW (ref 39.0–52.0)
Hemoglobin: 8.2 g/dL — ABNORMAL LOW (ref 13.0–17.0)
MCH: 26.6 pg (ref 26.0–34.0)
MCHC: 31.7 g/dL (ref 30.0–36.0)
MCV: 84.1 fL (ref 80.0–100.0)
Platelets: 376 K/uL (ref 150–400)
RBC: 3.08 MIL/uL — ABNORMAL LOW (ref 4.22–5.81)
RDW: 16.5 % — ABNORMAL HIGH (ref 11.5–15.5)
WBC: 10.2 K/uL (ref 4.0–10.5)
nRBC: 0.7 % — ABNORMAL HIGH (ref 0.0–0.2)

## 2018-11-07 LAB — BASIC METABOLIC PANEL
Anion gap: 12 (ref 5–15)
BUN: 6 mg/dL — ABNORMAL LOW (ref 8–23)
CO2: 23 mmol/L (ref 22–32)
Calcium: 8.9 mg/dL (ref 8.9–10.3)
Chloride: 98 mmol/L (ref 98–111)
Creatinine, Ser: 1.09 mg/dL (ref 0.61–1.24)
GFR calc Af Amer: >60 mL/min (ref >60)
GFR calc non Af Amer: >60 mL/min (ref >60)
Glucose, Bld: 126 mg/dL — ABNORMAL HIGH (ref 70–99)
Potassium: 3.7 mmol/L (ref 3.5–5.1)
Sodium: 133 mmol/L — ABNORMAL LOW (ref 135–145)

## 2018-11-07 LAB — GLUCOSE, CAPILLARY
Glucose-Capillary: 142 mg/dL — ABNORMAL HIGH (ref 70–99)
Glucose-Capillary: 136 mg/dL — ABNORMAL HIGH (ref 70–99)
Glucose-Capillary: 126 mg/dL — ABNORMAL HIGH (ref 70–99)
Glucose-Capillary: 144 mg/dL — ABNORMAL HIGH (ref 70–99)

## 2018-11-07 LAB — MAGNESIUM: Magnesium: 1.6 mg/dL — ABNORMAL LOW (ref 1.7–2.4)

## 2018-11-07 MED ORDER — MAGNESIUM SULFATE 2 GM/50ML IV SOLN
2.0000 g | Freq: Once | INTRAVENOUS | Status: AC
Start: 2018-11-07 — End: 2018-11-07
  Administered 2018-11-07: 2 g via INTRAVENOUS
  Filled 2018-11-07: qty 50

## 2018-11-07 MED ORDER — POTASSIUM CHLORIDE 10 MEQ/100ML IV SOLN
10.0000 meq | INTRAVENOUS | Status: AC
Start: 2018-11-07 — End: 2018-11-07
  Administered 2018-11-07 (×4): 10 meq via INTRAVENOUS
  Filled 2018-11-07 (×4): qty 100

## 2018-11-07 MED ORDER — AMLODIPINE BESYLATE 5 MG PO TABS
5.0000 mg | Freq: Every day | ORAL | Status: DC
Start: 2018-11-07 — End: 2018-11-12
  Administered 2018-11-07 – 2018-11-12 (×6): 5 mg via ORAL
  Filled 2018-11-07 (×6): qty 1

## 2018-11-07 MED ORDER — BISACODYL 10 MG RE SUPP
10.0000 mg | Freq: Every day | RECTAL | Status: DC
Start: 2018-11-07 — End: 2018-11-12

## 2018-11-07 NOTE — Progress Notes (Signed)
[facility] Progress Note     Subjective: CC-  Lying in bed. Feels bloated, no better or worse than yesterday. He reports mild nausea earlier this morning, no emesis. States that he ate about 50% of his dinner last night. He is passing flatus and having several very small loose BMs.  CT this AM shows fluid collection gallbladder fossa decreased in size, drain in place; dilated colon.  Objective: Vital signs in last 24 hours: Temp:  [98 F (36.7 C)-98.3 F (36.8 C)] 98.1 F (36.7 C) (07/10 0841) Pulse Rate:  [77-86] 84 (07/10 0841) Resp:  [12-26] 12 (07/10 0841) BP: (148-169)/(74-87) 148/74 (07/10 0841) SpO2:  [95 %-97 %] 95 % (07/10 0445) Last BM Date: 11/06/18  Intake/Output from previous day: 07/09 0701 - 07/10 0700 In: 1200 [P.O.:1080; I.V.:5; IV Piggyback:100] Out: 12.5 [Drains:12.5] Intake/Output this shift: Total I/O In: 350 [P.O.:350] Out: -   PE: Gen:  Alert, NAD HEENT: EOM's intact, pupils equal and round Pulm:  effort normal Abd: Soft, obese, mild distension, hyperactive BS, lap chole incisions are well healed, drain with minimal sanguinous drainage, nontender Skin: warm and dry  Lab Results:  Recent Labs    11/06/18 0356 11/07/18 0312  WBC 10.3 10.2  HGB 8.0* 8.2*  HCT 24.5* 25.9*  PLT 322 376   BMET Recent Labs    11/06/18 0356 11/07/18 0312  NA 131* 133*  K 4.0 3.7  CL 97* 98  CO2 24 23  GLUCOSE 129* 126*  BUN 7* 6*  CREATININE 1.17 1.09  CALCIUM 8.9 8.9   PT/INR No results for input(s): LABPROT, INR in the last 72 hours. CMP     Component Value Date/Time   NA 133 (L) 11/07/2018 0312   NA 132 (L) 05/01/2018 0932   K 3.7 11/07/2018 0312   CL 98 11/07/2018 0312   CO2 23 11/07/2018 0312   GLUCOSE 126 (H) 11/07/2018 0312   BUN 6 (L) 11/07/2018 0312   BUN 13 05/01/2018 0932   CREATININE 1.09 11/07/2018 0312   CALCIUM 8.9 11/07/2018 0312   PROT 5.6 (L) 11/06/2018 0356   ALBUMIN 2.3 (L) 11/06/2018 0356   AST 18  11/06/2018 0356   ALT 16 11/06/2018 0356   ALKPHOS 59 11/06/2018 0356   BILITOT 0.6 11/06/2018 0356   GFRNONAA >60 11/07/2018 0312   GFRAA >60 11/07/2018 0312   Lipase     Component Value Date/Time   LIPASE 19 11/04/2018 0259       Studies/Results: Ct Abdomen Wo Contrast  Result Date: 11/07/2018 CLINICAL DATA:  73 year old male with a history of prior gallbladder fossa abscess/hematoma EXAM: CT ABDOMEN WITHOUT CONTRAST TECHNIQUE: Multidetector CT imaging of the abdomen was performed following the standard protocol without IV contrast. COMPARISON:  11/01/2018, 10/31/2018, 06/11/2018 FINDINGS: Lower chest: Atelectasis at the right lung base. Hepatobiliary: Similar appearance flocculent fluid in the gallbladder fossa, status post cholecystectomy, with pigtail drain catheter terminating within the fluid collection. The fluid collection is decreased in size compared to the pre drainage CT, though persists with the greatest diameter 9 cm transverse and 5.9 cm AP. The fluid collection is inseparable from the duodenum and pylorus of the stomach with loss of the fat planes. There is a fat plane maintained between the adjacent hepatic flexure and the fluid collection, although there is mild colonic wall thickening of the hepatic flexure. Pancreas: Unremarkable pancreas Spleen: Unremarkable spleen Adrenals/Urinary Tract: Unremarkable adrenal glands. Kidneys without hydronephrosis or nephrolithiasis. Similar appearance of cyst on the lower pole  of the left kidney. Stomach/Bowel: Transverse colon and hepatic flexure somewhat dilated. Small bowel is decompressed. Stomach is relatively decompressed. Incomplete visualization of the bowel. Vascular/Lymphatic: Atherosclerotic calcifications. No adenopathy of the retroperitoneum. Small lymph nodes at the portacaval nodal stations. Other: None Musculoskeletal: No displaced fracture. Degenerative changes of the spine. IMPRESSION: Persisting flocculent fluid collection  in the gallbladder fossa, though decreased in size status post drain placement. Inflammatory changes of the gallbladder fossa abscess are inseparable from the pylorus and descending duodenum. Associated duodenitis not excluded. A fat plane is maintained between the abscess and the hepatic flexure of the colon, however, there is mild colon wall thickening adjacent to the abscess, potentially reactive inflammation. Aortic Atherosclerosis (ICD10-I70.0). Electronically Signed   By: Corrie Mckusick D.O.   On: 11/07/2018 08:47   Dg Abd 1 View  Result Date: 11/06/2018 CLINICAL DATA:  Abdominal distension EXAM: ABDOMEN - 1 VIEW COMPARISON:  CT abdomen pelvis 11/01/2018 FINDINGS: Pigtail drainage catheter RIGHT upper quadrant, by history gallbladder fossa abscess drain. Gaseous distention of ascending colon 13.2 cm. Gas is also seen throughout the transverse colon, less within small bowel and descending sigmoid colon. No bowel wall thickening identified. Bones demineralized. IMPRESSION: Gaseous distention of colon greatest at ascending colon which measures 13.2 cm transverse. Findings called to Dr. Reesa Chew on 11/06/2018 at 1335 hours. Electronically Signed   By: Lavonia Dana M.D.   On: 11/06/2018 13:26    Anti-infectives: Anti-infectives (From admission, onward)   Start     Dose/Rate Route Frequency Ordered Stop   11/06/18 1115  amoxicillin-clavulanate (AUGMENTIN) 875-125 MG per tablet 1 tablet     1 tablet Oral Every 12 hours 11/06/18 1110     11/06/18 1115  sulfamethoxazole-trimethoprim (BACTRIM DS) 800-160 MG per tablet 1 tablet     1 tablet Oral Every 12 hours 11/06/18 1110     11/01/18 0600  ceFEPIme (MAXIPIME) 1 g in sodium chloride 0.9 % 100 mL IVPB  Status:  Discontinued     1 g 200 mL/hr over 30 Minutes Intravenous Every 8 hours 10/31/18 1957 11/01/18 0256   11/01/18 0400  piperacillin-tazobactam (ZOSYN) IVPB 3.375 g  Status:  Discontinued     3.375 g 12.5 mL/hr over 240 Minutes Intravenous Every 8 hours  11/01/18 0258 11/06/18 1110   10/31/18 1944  ceFEPIme (MAXIPIME) 2 g injection    Note to Pharmacy: Daine Gravel   : cabinet override      10/31/18 1944 11/01/18 0759   10/31/18 1915  ceFEPIme (MAXIPIME) 2 g in sodium chloride 0.9 % 100 mL IVPB     2 g 200 mL/hr over 30 Minutes Intravenous  Once 10/31/18 1911 10/31/18 2052   10/31/18 1915  metroNIDAZOLE (FLAGYL) IVPB 500 mg     500 mg 100 mL/hr over 60 Minutes Intravenous  Once 10/31/18 1911 10/31/18 2100       Assessment/Plan Gallbladder fossa abscess, admission 07/04, S/P lap chole 10/07/18  - S/p IR drain that looks like old infected hematoma. - cultures showed Klebsiella and enterococcus - transitioned to PO abx 7/9 - CT 7/10 shows fluid collection gallbladder fossa decreased in size, drain in place; dilated colon - ok to progress toward discharge with drain when medically ready. F/u info on AVS - encourage ambulation/OOB. Add daily dulcolax suppository  FEN: heart healthy/carb mod VTE: SCD's, Eliquis ID: Zosyn 07/04>>7/9, augmentin/bactrim 7/9>> Follow up: Dr. Barry Dienes    LOS: 7 days    Wellington Hampshire , [facility] 11/07/2018, 11:05 AM  Pager: [phone] Mon-Thurs 7:00 am-4:30 pm Fri 7:00 am -11:30 AM Sat-Sun 7:00 am-11:30 am

## 2018-11-07 NOTE — Progress Notes (Signed)
Occupational Therapy Treatment Patient Details Name: [name] MRN: [id number] DOB: 10-07-1942 Today's Date: 11/07/2018    History of present illness [name] is a 73 y.o. male with medical history significant of CAD status post CABG x4, type 2 diabetes, hypertension, hyperlipidemia, diverticulitis, liver abscess, cholecystitis status post percutaneous cholecystostomy tube 1/12 and eventually lap chole on 6/9 presented to [facility] with complaints of abdominal pain..  S/P drain placement for old hematoma.   OT comments  Pt progressing towards acute OT goals. Focus of session was building activity tolerance during ADLs and initiating a HEP for general UB strengthening. Pt needing some encouragement to challenge his endurance. D/c plan remains appropriate.    Follow Up Recommendations  Home health OT    Equipment Recommendations  None recommended by OT    Recommendations for Other Services      Precautions / Restrictions Precautions Precautions: Fall Restrictions Weight Bearing Restrictions: No       Mobility Bed Mobility Overal bed mobility: Modified Independent                Transfers Overall transfer level: Needs assistance Equipment used: None Transfers: Sit to/from Stand Sit to Stand: Supervision         General transfer comment: for safety and immediate standing balance    Balance Overall balance assessment: Needs assistance Sitting-balance support: No upper extremity supported;Feet supported Sitting balance-Leahy Scale: Good     Standing balance support: Single extremity supported Standing balance-Leahy Scale: Fair Standing balance comment: seeking single extremity support, declined rw use in the halls                           ADL either performed or assessed with clinical judgement   ADL Overall ADL's : Needs assistance/impaired     Grooming: Oral care;Min guard;Standing;Wash/dry hands                    Toilet Transfer: Min guard;Ambulation;Comfort height toilet;Grab bars   Toileting- Clothing Manipulation and Hygiene: Set up;Sitting/lateral lean       Functional mobility during ADLs: Minimal assistance;Min guard General ADL Comments: Pt completed functional mobility in the room then in the hall, toilet transfer, and grooming tasks standing at sink.      Vision       Perception     Praxis      Cognition Arousal/Alertness: Awake/alert Behavior During Therapy: WFL for tasks assessed/performed Overall Cognitive Status: Within Functional Limits for tasks assessed                                          Exercises Exercises: Other exercises Other Exercises Other Exercises: Issued level 2 theraband and instructed in use.    Shoulder Instructions       General Comments      Pertinent Vitals/ Pain       Pain Assessment: Faces Faces Pain Scale: Hurts little more Pain Location: generalized discomfort Pain Descriptors / Indicators: Discomfort Pain Intervention(s): Limited activity within patient's tolerance;Monitored during session;Repositioned  Home Living                                          Prior Functioning/Environment  Frequency  Min 2X/week        Progress Toward Goals  OT Goals(current goals can now be found in the care plan section)  Progress towards OT goals: Progressing toward goals  Acute Rehab OT Goals Patient Stated Goal: back to playing golf. OT Goal Formulation: With patient Time For Goal Achievement: 11/19/18 Potential to Achieve Goals: Good ADL Goals Pt Will Perform Lower Body Bathing: Independently;sit to/from stand Pt Will Perform Lower Body Dressing: Independently;sit to/from stand Pt Will Transfer to Toilet: Independently;ambulating Pt Will Perform Toileting - Clothing Manipulation and hygiene: Independently;sit to/from stand Pt Will Perform Tub/Shower Transfer: Shower  transfer;with supervision;ambulating  Plan Discharge plan remains appropriate    Co-evaluation                 AM-PAC OT "6 Clicks" Daily Activity     Outcome Measure   Help from another person eating meals?: None Help from another person taking care of personal grooming?: None Help from another person toileting, which includes using toliet, bedpan, or urinal?: A Little Help from another person bathing (including washing, rinsing, drying)?: A Little Help from another person to put on and taking off regular upper body clothing?: None Help from another person to put on and taking off regular lower body clothing?: A Little 6 Click Score: 21    End of Session    OT Visit Diagnosis: Other abnormalities of gait and mobility (R26.89);Muscle weakness (generalized) (M62.81);Other (comment)   Activity Tolerance Patient tolerated treatment well;Patient limited by fatigue   Patient Left in bed;with call bell/phone within reach   Nurse Communication          Time: [id number] OT Time Calculation (min): 21 min  Charges: OT General Charges $OT Visit: 1 Visit OT Treatments $Self Care/Home Management : 8-22 mins  Raynald KempKathryn Kailin Leu, OT Acute Rehabilitation Services Pager: [phone] Office: [phone]    Pilar GrammesMathews, Holman Bonsignore H 11/07/2018, 11:07 AM

## 2018-11-07 NOTE — Progress Notes (Signed)
Pt walked 353ft with rolling walker (states he uses a rolling walker at home) without difficulty on RA. Pt back in bed and call light within reach.

## 2018-11-07 NOTE — Care Management Important Message (Signed)
Important Message  Patient Details  Name: [name] MRN: [id number] Date of Birth: 01/07/1943   Medicare Important Message Given:  Yes     Shelda Altes 11/07/2018, 1:17 PM

## 2018-11-07 NOTE — Progress Notes (Signed)
PROGRESS NOTE    [name]  WUJ:[id number] DOB: Jan 31, 1943 DOA: 10/31/2018 PCP: Karle PlumberArvind, Moogali M, MD   Brief Narrative:  73 year old with a history of CAD status post CABG, diabetes mellitus type 2, essential hypertension, hyperlipidemia, liver abscess, diverticulitis, cholecystitis status post laparoscopic cholecystectomy came to the hospital with complains of abdominal pain.  Eventually was found to have 7 x 9.9X 9.4 cm fluid collection.  General surgery and iron were consulted.  Patient was started on antibiotics, IR drain was placed on 7//20.   Assessment & Plan:   Principal Problem:   Abscess, intra-abdominal, postoperative Active Problems:   Hx of CABG   Coronary artery disease   Non-insulin dependent type 2 diabetes mellitus (HCC)   AKI (acute kidney injury) (HCC)   RBBB  Abdominal pain secondary to gallbladder fossa abscess -Status post IR drain placement on 11/01/18.  Follow-up CT abdomen performed today, shows some improvement in fluid collection in the gallbladder fossa. -Blood culture-negative -Wound culture is growing Klebsiella and enterococcus.  Transitioned patient from IV antibiotics to oral Bactrim and Augmentin twice daily. -Monitor her PO intake today.   Constipation with abdominal distention, ileus -Advised ambulation, monitor electrolytes.  Will repeat KUB again tomorrow morning.  Acute on chronic anemia -Hemoglobin is around 8.0.  Stable.  Continue to monitor this.  CAD status post CABG X4 December 2019 A fib/Flutter, controlled. S/p Bypass -Does not appear to be in any chest pain.  Appears comfortable from cardiac standpoint. -On Eliquis  Diabetes mellitus type 2 -Insulin sliding scale Accu-Chek.  PT/OT evaluation- rec home HH  DVT prophylaxis: SCDs  Code Status: Full code Family Communication: None at bedside Disposition Plan: Feels quite weak and abdomen feels distended.  Consultants:   Surgery  Radiology  Procedures:   Drain  placed 7/4  Antimicrobials:   Augmentin  Bactrim   Subjective: Tells me his abdomen feels distended and has slight belly discomfort especially on the right side and the lower abdomen.  Review of Systems Otherwise negative except as per HPI, including: General = no fevers, chills, dizziness, malaise, fatigue HEENT/EYES = negative for pain, redness, loss of vision, double vision, blurred vision, loss of hearing, sore throat, hoarseness, dysphagia Cardiovascular= negative for chest pain, palpitation, murmurs, lower extremity swelling Respiratory/lungs= negative for shortness of breath, cough, hemoptysis, wheezing, mucus production Gastrointestinal= negative for nausea, vomiting, melena, hematemesis Genitourinary= negative for Dysuria, Hematuria, Change in Urinary Frequency MSK = Negative for arthralgia, myalgias, Back Pain, Joint swelling  Neurology= Negative for headache, seizures, numbness, tingling  Psychiatry= Negative for anxiety, depression, suicidal and homocidal ideation Allergy/Immunology= Medication/Food allergy as listed  Skin= Negative for Rash, lesions, ulcers, itching     Objective: Vitals:   11/07/18 0003 11/07/18 0445 11/07/18 0455 11/07/18 0841  BP: (!) 154/82 (!) 166/87 (!) 169/83 (!) 148/74  Pulse: 79   84  Resp: 15 18 12 12   Temp: 98.1 F (36.7 C) 98 F (36.7 C)  98.1 F (36.7 C)  TempSrc: Oral Oral  Oral  SpO2: 96% 95%    Weight:      Height:        Intake/Output Summary (Last 24 hours) at 11/07/2018 1003 Last data filed at 11/07/2018 0858 Gross per 24 hour  Intake 1550 ml  Output 12.5 ml  Net 1537.5 ml   Filed Weights   10/31/18 1720 11/01/18 0140  Weight: 105.7 kg 106.3 kg    Examination:  Constitutional: NAD, calm, comfortable Eyes: PERRL, lids and conjunctivae normal ENMT: Mucous membranes  are moist. Posterior pharynx clear of any exudate or lesions.Normal dentition.  Neck: normal, supple, no masses, no thyromegaly Respiratory: clear  to auscultation bilaterally, no wheezing, no crackles. Normal respiratory effort. No accessory muscle use.  Cardiovascular: Regular rate and rhythm, no murmurs / rubs / gallops. No extremity edema. 2+ pedal pulses. No carotid bruits.  Abdomen: Mildly distended abdomen, slightly tender to deep palpation.  Right upper quadrant drain in place.  Positive bowel sounds. Musculoskeletal: no clubbing / cyanosis. No joint deformity upper and lower extremities. Good ROM, no contractures. Normal muscle tone.  Skin: no rashes, lesions, ulcers. No induration Neurologic: CN 2-12 grossly intact. Sensation intact, DTR normal. Strength 4/5 in all 4.  Psychiatric: Normal judgment and insight. Alert and oriented x 3. Normal mood.   Right upper quadrant drain noted with dark blood.  Data Reviewed:   CBC: Recent Labs  Lab 10/31/18 1836  11/02/18 1223 11/02/18 1542  11/03/18 1459 11/04/18 0259 11/05/18 0421 11/06/18 0356 11/07/18 0312  WBC 24.6*   < > 9.2 15.4*   < > 10.4 12.2* 11.2* 10.3 10.2  NEUTROABS 21.5*  --  8.5* 12.6*  --  8.9*  --   --   --   --   HGB 10.0*   < > 7.6* 7.5*   < > 6.4* 8.0* 8.0* 8.0* 8.2*  HCT 32.5*   < > 24.3* 24.0*   < > 20.6* 24.6* 24.7* 24.5* 25.9*  MCV 84.6   < > 84.4 83.0   < > 82.1 83.1 83.2 84.5 84.1  PLT 430*   < > 220 226   < > 196 234 228 322 376   < > = values in this interval not displayed.   Basic Metabolic Panel: Recent Labs  Lab 11/03/18 0323 11/04/18 0259 11/05/18 0421 11/06/18 0356 11/07/18 0312  NA 133* 131* 130* 131* 133*  K 3.4* 3.9 3.4* 4.0 3.7  CL 98 98 99 97* 98  CO2 22 22 22 24 23   GLUCOSE 132* 151* 149* 129* 126*  BUN 12 8 8  7* 6*  CREATININE 1.31* 1.11 1.13 1.17 1.09  CALCIUM 8.6* 8.8* 8.7* 8.9 8.9  MG  --   --   --   --  1.6*   GFR: Estimated Creatinine Clearance: 71.5 mL/min (by C-G formula based on SCr of 1.09 mg/dL). Liver Function Tests: Recent Labs  Lab 11/02/18 0252 11/03/18 0323 11/04/18 0259 11/05/18 0421 11/06/18 0356   AST 14* 22 19 18 18   ALT 15 22 18 15 16   ALKPHOS 51 64 69 64 59  BILITOT 0.8 0.6 0.6 0.7 0.6  PROT 5.5* 5.3* 5.4* 5.3* 5.6*  ALBUMIN 2.4* 2.3* 2.3* 2.2* 2.3*   Recent Labs  Lab 10/31/18 1836 11/02/18 0252 11/03/18 0323 11/04/18 0259  LIPASE 26 24 21 19    No results for input(s): AMMONIA in the last 168 hours. Coagulation Profile: No results for input(s): INR, PROTIME in the last 168 hours. Cardiac Enzymes: No results for input(s): CKTOTAL, CKMB, CKMBINDEX, TROPONINI in the last 168 hours. BNP (last 3 results) No results for input(s): PROBNP in the last 8760 hours. HbA1C: No results for input(s): HGBA1C in the last 72 hours. CBG: Recent Labs  Lab 11/06/18 0625 11/06/18 1134 11/06/18 1623 11/06/18 2135 11/07/18 0635  GLUCAP 139* 149* 153* 158* 142*   Lipid Profile: No results for input(s): CHOL, HDL, LDLCALC, TRIG, CHOLHDL, LDLDIRECT in the last 72 hours. Thyroid Function Tests: No results for input(s): TSH, T4TOTAL, FREET4, T3FREE, THYROIDAB  in the last 72 hours. Anemia Panel: No results for input(s): VITAMINB12, FOLATE, FERRITIN, TIBC, IRON, RETICCTPCT in the last 72 hours. Sepsis Labs: Recent Labs  Lab 10/31/18 1939 10/31/18 2146 11/01/18 0516  LATICACIDVEN 3.6* 2.8* 1.5    Recent Results (from the past 240 hour(s))  Blood culture (routine x 2)     Status: None   Collection Time: 10/31/18  7:40 PM   Specimen: BLOOD  Result Value Ref Range Status   Specimen Description   Final    BLOOD LEFT HAND Performed at [facility], [address] [place], [place] [address]    Special Requests   Final    BOTTLES DRAWN AEROBIC ONLY Blood Culture results may not be optimal due to an inadequate volume of blood received in culture bottles Performed at [facility] [place], [address] [place], [place] [address]    Culture   Final    NO GROWTH 5 DAYS Performed at [facility], [address] [place], [place] [address]    Report Status 11/05/2018  FINAL  Final  SARS Coronavirus 2 (Hosp order,Performed in [facility] lab via [employer] ID)     Status: None   Collection Time: 10/31/18  8:52 PM   Specimen: Dry Nasal Swab (Abbott ID Now)  Result Value Ref Range Status   SARS Coronavirus 2 (Abbott ID Now) NEGATIVE NEGATIVE Final    Comment: (NOTE) SARS-CoV-2 target nucleic acids are NOT DETECTED. The SARS-CoV-2 RNA is generally detectable in upper and lower respiratory specimens during the acute phase of infection.  Negativeresults do not preclude SARS-CoV-2 infection, do not rule out coinfections with other pathogens, and should not be used as the  sole basis for treatment or other patient management decisions.  Negative results must be combined with clinical observations, patient history, and epidemiological information. The expected result is Negative. Fact Sheet for Patients: http://www.graves-ford.org/https://www.fda.gov/media/136524/download Fact Sheet for Healthcare Providers: EnviroConcern.sihttps://www.fda.gov/media/136523/download This test is not yet approved or cleared by the [place] FDA and  has been authorized for detection and/or diagnosis of SARS-CoV-2 by FDA under an Emergency Use Authorization (EUA).  This EUA will remain in effect (meaning this test can be used) for the duration of  the COVID19 declaration under Section 5 64(b)(1) of the Act, 21 U.S.C.  section [id number] 3(b)(1), unless the authorization is terminated or revoked sooner. Performed at [facility] [place], [address] [place], [place] [address]   Aerobic/Anaerobic Culture (surgical/deep wound)     Status: None   Collection Time: 11/01/18 11:33 AM   Specimen: Abdomen; Abscess  Result Value Ref Range Status   Specimen Description ABDOMEN  Final   Special Requests Normal  Final   Gram Stain   Final    MODERATE WBC PRESENT, PREDOMINANTLY PMN MODERATE GRAM NEGATIVE RODS FEW GRAM POSITIVE COCCI IN PAIRS    Culture   Final    ABUNDANT KLEBSIELLA PNEUMONIAE ABUNDANT ENTEROCOCCUS  FAECALIS ABUNDANT PREVOTELLA DENTICOLA BETA LACTAMASE POSITIVE Performed at [facility], [address] [place], [place] [address]    Report Status 11/06/2018 FINAL  Final   Organism ID, Bacteria ENTEROCOCCUS FAECALIS  Final   Organism ID, Bacteria KLEBSIELLA PNEUMONIAE  Final      Susceptibility   Enterococcus faecalis - MIC*    AMPICILLIN <=2 SENSITIVE Sensitive     VANCOMYCIN 1 SENSITIVE Sensitive     GENTAMICIN SYNERGY SENSITIVE Sensitive     * ABUNDANT ENTEROCOCCUS FAECALIS   Klebsiella  pneumoniae - MIC*    AMPICILLIN >=32 RESISTANT Resistant     CEFAZOLIN >=64 RESISTANT Resistant     CEFEPIME <=1 SENSITIVE Sensitive     CEFTAZIDIME 4 SENSITIVE Sensitive     CEFTRIAXONE 4 SENSITIVE Sensitive     CIPROFLOXACIN <=0.25 SENSITIVE Sensitive     GENTAMICIN <=1 SENSITIVE Sensitive     IMIPENEM <=0.25 SENSITIVE Sensitive     TRIMETH/SULFA <=20 SENSITIVE Sensitive     AMPICILLIN/SULBACTAM >=32 RESISTANT Resistant     PIP/TAZO 8 SENSITIVE Sensitive     Extended ESBL NEGATIVE Sensitive     * ABUNDANT KLEBSIELLA PNEUMONIAE         Radiology Studies: Ct Abdomen Wo Contrast  Result Date: 11/07/2018 CLINICAL DATA:  73 year old male with a history of prior gallbladder fossa abscess/hematoma EXAM: CT ABDOMEN WITHOUT CONTRAST TECHNIQUE: Multidetector CT imaging of the abdomen was performed following the standard protocol without IV contrast. COMPARISON:  11/01/2018, 10/31/2018, 06/11/2018 FINDINGS: Lower chest: Atelectasis at the right lung base. Hepatobiliary: Similar appearance flocculent fluid in the gallbladder fossa, status post cholecystectomy, with pigtail drain catheter terminating within the fluid collection. The fluid collection is decreased in size compared to the pre drainage CT, though persists with the greatest diameter 9 cm transverse and 5.9 cm AP. The fluid collection is inseparable from the duodenum and pylorus of the stomach with loss of the fat planes. There is a  fat plane maintained between the adjacent hepatic flexure and the fluid collection, although there is mild colonic wall thickening of the hepatic flexure. Pancreas: Unremarkable pancreas Spleen: Unremarkable spleen Adrenals/Urinary Tract: Unremarkable adrenal glands. Kidneys without hydronephrosis or nephrolithiasis. Similar appearance of cyst on the lower pole of the left kidney. Stomach/Bowel: Transverse colon and hepatic flexure somewhat dilated. Small bowel is decompressed. Stomach is relatively decompressed. Incomplete visualization of the bowel. Vascular/Lymphatic: Atherosclerotic calcifications. No adenopathy of the retroperitoneum. Small lymph nodes at the portacaval nodal stations. Other: None Musculoskeletal: No displaced fracture. Degenerative changes of the spine. IMPRESSION: Persisting flocculent fluid collection in the gallbladder fossa, though decreased in size status post drain placement. Inflammatory changes of the gallbladder fossa abscess are inseparable from the pylorus and descending duodenum. Associated duodenitis not excluded. A fat plane is maintained between the abscess and the hepatic flexure of the colon, however, there is mild colon wall thickening adjacent to the abscess, potentially reactive inflammation. Aortic Atherosclerosis (ICD10-I70.0). Electronically Signed   By: Gilmer Mor D.O.   On: 11/07/2018 08:47   Dg Abd 1 View  Result Date: 11/06/2018 CLINICAL DATA:  Abdominal distension EXAM: ABDOMEN - 1 VIEW COMPARISON:  CT abdomen pelvis 11/01/2018 FINDINGS: Pigtail drainage catheter RIGHT upper quadrant, by history gallbladder fossa abscess drain. Gaseous distention of ascending colon 13.2 cm. Gas is also seen throughout the transverse colon, less within small bowel and descending sigmoid colon. No bowel wall thickening identified. Bones demineralized. IMPRESSION: Gaseous distention of colon greatest at ascending colon which measures 13.2 cm transverse. Findings called to Dr.  Nelson Chimes on 11/06/2018 at 1335 hours. Electronically Signed   By: Ulyses Southward M.D.   On: 11/06/2018 13:26        Scheduled Meds: . amLODipine  5 mg Oral Daily  . amoxicillin-clavulanate  1 tablet Oral Q12H  . apixaban  5 mg Oral BID  . aspirin EC  81 mg Oral Daily  . bisacodyl  10 mg Rectal Once  . docusate sodium  100 mg Oral BID  . finasteride  5 mg Oral Daily  . insulin  aspart  0-9 Units Subcutaneous TID WC  . metoprolol tartrate  25 mg Oral BID  . polyethylene glycol  17 g Oral Daily  . sodium chloride flush  5 mL Intracatheter Q8H  . sulfamethoxazole-trimethoprim  1 tablet Oral Q12H   Continuous Infusions: . potassium chloride 10 mEq (11/07/18 0946)     LOS: 7 days   Time spent= 25 mins    Selenne Coggin Joline Maxcyhirag Wilmot Quevedo, MD [facility]  If 7PM-7AM, please contact night-coverage [email or web address] 11/07/2018, 10:03 AM

## 2018-11-07 NOTE — Progress Notes (Signed)
Referring Physician(s): Dr. Barry Dienes  Supervising Physician: Corrie Mckusick  Patient Status:  [facility] - In-pt  Chief Complaint: Kennyth Arnold bladder fossa abscess  Subjective: Patient resting in bed.  States he has been up in chair most of the day. Drain in place.   Allergies: Ciprofloxacin and Erythromycin ethylsuccinate  Medications: Prior to Admission medications   Medication Sig Start Date End Date Taking? Authorizing Provider  acetaminophen (TYLENOL) 500 MG tablet Take 1,000 mg by mouth daily as needed for moderate pain.   Yes [provider]  amiodarone (PACERONE) 200 MG tablet TAKE 1 TABLET BY MOUTH DAILY Patient taking differently: Take 200 mg by mouth daily.  07/04/18  Yes Lorretta Harp, MD  amLODipine (NORVASC) 5 MG tablet Take 5 mg by mouth daily. 02/05/18  Yes [provider]  aspirin EC 81 MG tablet Take 1 tablet (81 mg total) by mouth daily. Patient taking differently: Take 81 mg by mouth every evening.  05/01/18  Yes Kilroy, Doreene Burke, PA-C  Cholecalciferol (VITAMIN D3) 2000 units TABS Take 2,000 Units by mouth 2 (two) times daily.    Yes [provider]  docusate sodium (COLACE) 100 MG capsule Take 100 mg by mouth 2 (two) times daily.    Yes [provider]  ELIQUIS 5 MG TABS tablet TAKE ONE TABLET BY MOUTH TWICE DAILY 06/03/18  Yes Lorretta Harp, MD  finasteride (PROSCAR) 5 MG tablet Take 5 mg by mouth daily. 02/05/18  Yes [provider]  loratadine (CLARITIN) 10 MG tablet Take 10 mg by mouth daily.   Yes [provider]  metFORMIN (GLUCOPHAGE) 1000 MG tablet Take 1,000 mg by mouth 2 (two) times daily. 02/05/18  Yes [provider]  metoprolol tartrate (LOPRESSOR) 25 MG tablet Take 1 tablet (25 mg total) by mouth 2 (two) times daily. 05/20/18  Yes Lorretta Harp, MD  Multiple Vitamins-Minerals (MULTIVITAMIN ADULT PO) Take 1 tablet by mouth daily.   Yes [provider]  oxyCODONE (OXY IR/ROXICODONE) 5 MG  immediate release tablet Take 1 tablet (5 mg total) by mouth every 6 (six) hours as needed for severe pain. Patient not taking: Reported on 11/01/2018 10/07/18   [name], MD     Vital Signs: BP (!) 151/82 (BP Location: Right Arm)    Pulse 73    Temp 98 F (36.7 C) (Oral)    Resp 16    Ht 5\' 11"  (1.803 m)    Wt 234 lb 4.8 oz (106.3 kg)    SpO2 95%    BMI 32.68 kg/m   Physical Exam  NAD, alert Abdomen:  Soft.  Drain in place in RUQ. Bloody output in bulb.  Flushes easily.   Imaging: Ct Abdomen Wo Contrast  Result Date: 11/07/2018 CLINICAL DATA:  73 year old male with a history of prior gallbladder fossa abscess/hematoma EXAM: CT ABDOMEN WITHOUT CONTRAST TECHNIQUE: Multidetector CT imaging of the abdomen was performed following the standard protocol without IV contrast. COMPARISON:  11/01/2018, 10/31/2018, 06/11/2018 FINDINGS: Lower chest: Atelectasis at the right lung base. Hepatobiliary: Similar appearance flocculent fluid in the gallbladder fossa, status post cholecystectomy, with pigtail drain catheter terminating within the fluid collection. The fluid collection is decreased in size compared to the pre drainage CT, though persists with the greatest diameter 9 cm transverse and 5.9 cm AP. The fluid collection is inseparable from the duodenum and pylorus of the stomach with loss of the fat planes. There is a fat plane maintained between the adjacent hepatic flexure and  the fluid collection, although there is mild colonic wall thickening of the hepatic flexure. Pancreas: Unremarkable pancreas Spleen: Unremarkable spleen Adrenals/Urinary Tract: Unremarkable adrenal glands. Kidneys without hydronephrosis or nephrolithiasis. Similar appearance of cyst on the lower pole of the left kidney. Stomach/Bowel: Transverse colon and hepatic flexure somewhat dilated. Small bowel is decompressed. Stomach is relatively decompressed. Incomplete visualization of the bowel. Vascular/Lymphatic: Atherosclerotic  calcifications. No adenopathy of the retroperitoneum. Small lymph nodes at the portacaval nodal stations. Other: None Musculoskeletal: No displaced fracture. Degenerative changes of the spine. IMPRESSION: Persisting flocculent fluid collection in the gallbladder fossa, though decreased in size status post drain placement. Inflammatory changes of the gallbladder fossa abscess are inseparable from the pylorus and descending duodenum. Associated duodenitis not excluded. A fat plane is maintained between the abscess and the hepatic flexure of the colon, however, there is mild colon wall thickening adjacent to the abscess, potentially reactive inflammation. Aortic Atherosclerosis (ICD10-I70.0). Electronically Signed   By: Gilmer MorJaime  Wagner D.O.   On: 11/07/2018 08:47   Dg Abd 1 View  Result Date: 11/06/2018 CLINICAL DATA:  Abdominal distension EXAM: ABDOMEN - 1 VIEW COMPARISON:  CT abdomen pelvis 11/01/2018 FINDINGS: Pigtail drainage catheter RIGHT upper quadrant, by history gallbladder fossa abscess drain. Gaseous distention of ascending colon 13.2 cm. Gas is also seen throughout the transverse colon, less within small bowel and descending sigmoid colon. No bowel wall thickening identified. Bones demineralized. IMPRESSION: Gaseous distention of colon greatest at ascending colon which measures 13.2 cm transverse. Findings called to Dr. Nelson ChimesAmin on 11/06/2018 at 1335 hours. Electronically Signed   By: Ulyses SouthwardMark  Boles M.D.   On: 11/06/2018 13:26    Labs:  CBC: Recent Labs    11/04/18 0259 11/05/18 0421 11/06/18 0356 11/07/18 0312  WBC 12.2* 11.2* 10.3 10.2  HGB 8.0* 8.0* 8.0* 8.2*  HCT 24.6* 24.7* 24.5* 25.9*  PLT 234 228 322 376    COAGS: Recent Labs    03/14/18 1610 03/26/18 1146 03/31/18 1458 04/08/18 0238 05/10/18 0907 10/06/18 0903  INR 0.96 0.96 1.37 1.06 1.42 0.9  APTT 32 31 29  --   --   --     BMP: Recent Labs    11/04/18 0259 11/05/18 0421 11/06/18 0356 11/07/18 0312  NA 131* 130*  131* 133*  K 3.9 3.4* 4.0 3.7  CL 98 99 97* 98  CO2 22 22 24 23   GLUCOSE 151* 149* 129* 126*  BUN 8 8 7* 6*  CALCIUM 8.8* 8.7* 8.9 8.9  CREATININE 1.11 1.13 1.17 1.09  GFRNONAA >60 >60 >60 >60  GFRAA >60 >60 >60 >60    LIVER FUNCTION TESTS: Recent Labs    11/03/18 0323 11/04/18 0259 11/05/18 0421 11/06/18 0356  BILITOT 0.6 0.6 0.7 0.6  AST 22 19 18 18   ALT 22 18 15 16   ALKPHOS 64 69 64 59  PROT 5.3* 5.4* 5.3* 5.6*  ALBUMIN 2.3* 2.3* 2.2* 2.3*    Assessment and Plan: Gall bladder fossa abscess s/p drain placement 7/4 Afebrile WBC 10.2 Slow drainage due to hematoma.  Continue routine flushes.  IR following.   Electronically Signed: Hoyt KochKacie Sue-Ellen Kaylon Hitz, PA 11/07/2018, 2:18 PM   I spent a total of 15 Minutes at the the patient's bedside AND on the patient's hospital floor or unit, greater than 50% of which was counseling/coordinating care for gall bladder fossa abscess.

## 2018-11-08 ENCOUNTER — Encounter

## 2018-11-08 ENCOUNTER — Inpatient Hospital Stay (HOSPITAL_COMMUNITY): Payer: Medicare HMO

## 2018-11-08 LAB — GLUCOSE, CAPILLARY
Glucose-Capillary: 106 mg/dL — ABNORMAL HIGH (ref 70–99)
Glucose-Capillary: 117 mg/dL — ABNORMAL HIGH (ref 70–99)
Glucose-Capillary: 129 mg/dL — ABNORMAL HIGH (ref 70–99)
Glucose-Capillary: 129 mg/dL — ABNORMAL HIGH (ref 70–99)

## 2018-11-08 LAB — CBC
HCT: 24.2 % — ABNORMAL LOW (ref 39.0–52.0)
Hemoglobin: 7.7 g/dL — ABNORMAL LOW (ref 13.0–17.0)
MCH: 26.8 pg (ref 26.0–34.0)
MCHC: 31.8 g/dL (ref 30.0–36.0)
MCV: 84.3 fL (ref 80.0–100.0)
Platelets: 386 K/uL (ref 150–400)
RBC: 2.87 MIL/uL — ABNORMAL LOW (ref 4.22–5.81)
RDW: 16.8 % — ABNORMAL HIGH (ref 11.5–15.5)
WBC: 6.6 K/uL (ref 4.0–10.5)
nRBC: 0.5 % — ABNORMAL HIGH (ref 0.0–0.2)

## 2018-11-08 LAB — BASIC METABOLIC PANEL
Anion gap: 10 (ref 5–15)
BUN: 10 mg/dL (ref 8–23)
CO2: 24 mmol/L (ref 22–32)
Calcium: 8.6 mg/dL — ABNORMAL LOW (ref 8.9–10.3)
Chloride: 101 mmol/L (ref 98–111)
Creatinine, Ser: 1.24 mg/dL (ref 0.61–1.24)
GFR calc Af Amer: >60 mL/min (ref >60)
GFR calc non Af Amer: 56 mL/min — ABNORMAL LOW (ref >60)
Glucose, Bld: 106 mg/dL — ABNORMAL HIGH (ref 70–99)
Potassium: 3.6 mmol/L (ref 3.5–5.1)
Sodium: 135 mmol/L (ref 135–145)

## 2018-11-08 LAB — MAGNESIUM: Magnesium: 1.9 mg/dL (ref 1.7–2.4)

## 2018-11-08 MED ORDER — TECHNETIUM TC 99M MEBROFENIN IV KIT
5.0000 | Freq: Once | INTRAVENOUS | Status: DC | PRN
Start: 2018-11-08 — End: 2018-11-12

## 2018-11-08 NOTE — Progress Notes (Signed)
[facility]/Trauma Progress Note      Assessment/Plan Gallbladder fossa abscess, admission 07/04, S/P lap chole 10/07/18 -S/p IR drain  - cultures showedKlebsiella and enterococcus- transitioned to PO abx 7/9 - CT 7/10 shows fluid collection gallbladder fossa decreased in size, drain in place - output overnight increased and is bilious, pt feels better - this could have been an infected bilioma/hematoma, we will monitor output which was 300 just overnight.  - if this continues to remain high, possible a bile leak and may need ERCP Colonic ileus - AXR this am looks like dilitation is improved, final read pending - encourage ambulation/OOB. Add daily dulcolax suppository  ZOX:WRUEAFEN:heart healthy/carb mod VTE: SCD's,Eliquis VW:UJWJX:Zosyn 07/04>>7/9, augmentin/bactrim 7/9>> Follow up:Dr. Donell BeersByerly    LOS: 8 days    Subjective: CC: abdominal pain  Pt states he feels 100% better today. He states increased drain output overnight and less abdominal pain. He has been walking. He is tolerating his diet and had more flatus overnight. Loose BM's but no formed BM's.   Objective: Vital signs in last 24 hours: Temp:  [98 F (36.7 C)-99.2 F (37.3 C)] 98.8 F (37.1 C) (07/11 0757) Pulse Rate:  [73-94] 94 (07/11 0757) Resp:  [12-18] 18 (07/11 0757) BP: (139-168)/(72-87) 168/72 (07/11 0757) SpO2:  [93 %-97 %] 94 % (07/11 0433) Last BM Date: 11/07/18  Intake/Output from previous day: 07/10 0701 - 07/11 0700 In: 1641.3 [P.O.:1250; IV Piggyback:391.3] Out: 390 [Drains:390] Intake/Output this shift: No intake/output data recorded.  PE: Gen:  Alert, NAD HEENT: pupils equal and round Pulm:  rate and effort normal Abd: Soft,obese, no distension,+ BS,lap choleincisions are well healed,drain with copious dark bilious output, mild TTP of upper abdomen, worse in RUQ without guarding, no peritonitis  Skin: warm and dry  Anti-infectives: Anti-infectives (From admission, onward)    Start     Dose/Rate Route Frequency Ordered Stop   11/06/18 1115  amoxicillin-clavulanate (AUGMENTIN) 875-125 MG per tablet 1 tablet     1 tablet Oral Every 12 hours 11/06/18 1110     11/06/18 1115  sulfamethoxazole-trimethoprim (BACTRIM DS) 800-160 MG per tablet 1 tablet     1 tablet Oral Every 12 hours 11/06/18 1110     11/01/18 0600  ceFEPIme (MAXIPIME) 1 g in sodium chloride 0.9 % 100 mL IVPB  Status:  Discontinued     1 g 200 mL/hr over 30 Minutes Intravenous Every 8 hours 10/31/18 1957 11/01/18 0256   11/01/18 0400  piperacillin-tazobactam (ZOSYN) IVPB 3.375 g  Status:  Discontinued     3.375 g 12.5 mL/hr over 240 Minutes Intravenous Every 8 hours 11/01/18 0258 11/06/18 1110   10/31/18 1944  ceFEPIme (MAXIPIME) 2 g injection    Note to Pharmacy: Bartholomew Crewsavis, Jennaya   : cabinet override      10/31/18 1944 11/01/18 0759   10/31/18 1915  ceFEPIme (MAXIPIME) 2 g in sodium chloride 0.9 % 100 mL IVPB     2 g 200 mL/hr over 30 Minutes Intravenous  Once 10/31/18 1911 10/31/18 2052   10/31/18 1915  metroNIDAZOLE (FLAGYL) IVPB 500 mg     500 mg 100 mL/hr over 60 Minutes Intravenous  Once 10/31/18 1911 10/31/18 2100      Lab Results:  Recent Labs    11/07/18 0312 11/08/18 0307  WBC 10.2 6.6  HGB 8.2* 7.7*  HCT 25.9* 24.2*  PLT 376 386   BMET Recent Labs    11/07/18 0312 11/08/18 0307  NA 133* 135  K 3.7 3.6  CL 98 101  CO2 23 24  GLUCOSE 126* 106*  BUN 6* 10  CREATININE 1.09 1.24  CALCIUM 8.9 8.6*   PT/INR No results for input(s): LABPROT, INR in the last 72 hours. CMP     Component Value Date/Time   NA 135 11/08/2018 0307   NA 132 (L) 05/01/2018 0932   K 3.6 11/08/2018 0307   CL 101 11/08/2018 0307   CO2 24 11/08/2018 0307   GLUCOSE 106 (H) 11/08/2018 0307   BUN 10 11/08/2018 0307   BUN 13 05/01/2018 0932   CREATININE 1.24 11/08/2018 0307   CALCIUM 8.6 (L) 11/08/2018 0307   PROT 5.6 (L) 11/06/2018 0356   ALBUMIN 2.3 (L) 11/06/2018 0356   AST 18 11/06/2018  0356   ALT 16 11/06/2018 0356   ALKPHOS 59 11/06/2018 0356   BILITOT 0.6 11/06/2018 0356   GFRNONAA 56 (L) 11/08/2018 0307   GFRAA >60 11/08/2018 0307   Lipase     Component Value Date/Time   LIPASE 19 11/04/2018 0259    Studies/Results: Ct Abdomen Wo Contrast  Result Date: 11/07/2018 CLINICAL DATA:  73 year old male with a history of prior gallbladder fossa abscess/hematoma EXAM: CT ABDOMEN WITHOUT CONTRAST TECHNIQUE: Multidetector CT imaging of the abdomen was performed following the standard protocol without IV contrast. COMPARISON:  11/01/2018, 10/31/2018, 06/11/2018 FINDINGS: Lower chest: Atelectasis at the right lung base. Hepatobiliary: Similar appearance flocculent fluid in the gallbladder fossa, status post cholecystectomy, with pigtail drain catheter terminating within the fluid collection. The fluid collection is decreased in size compared to the pre drainage CT, though persists with the greatest diameter 9 cm transverse and 5.9 cm AP. The fluid collection is inseparable from the duodenum and pylorus of the stomach with loss of the fat planes. There is a fat plane maintained between the adjacent hepatic flexure and the fluid collection, although there is mild colonic wall thickening of the hepatic flexure. Pancreas: Unremarkable pancreas Spleen: Unremarkable spleen Adrenals/Urinary Tract: Unremarkable adrenal glands. Kidneys without hydronephrosis or nephrolithiasis. Similar appearance of cyst on the lower pole of the left kidney. Stomach/Bowel: Transverse colon and hepatic flexure somewhat dilated. Small bowel is decompressed. Stomach is relatively decompressed. Incomplete visualization of the bowel. Vascular/Lymphatic: Atherosclerotic calcifications. No adenopathy of the retroperitoneum. Small lymph nodes at the portacaval nodal stations. Other: None Musculoskeletal: No displaced fracture. Degenerative changes of the spine. IMPRESSION: Persisting flocculent fluid collection in the  gallbladder fossa, though decreased in size status post drain placement. Inflammatory changes of the gallbladder fossa abscess are inseparable from the pylorus and descending duodenum. Associated duodenitis not excluded. A fat plane is maintained between the abscess and the hepatic flexure of the colon, however, there is mild colon wall thickening adjacent to the abscess, potentially reactive inflammation. Aortic Atherosclerosis (ICD10-I70.0). Electronically Signed   By: Corrie Mckusick D.O.   On: 11/07/2018 08:47   Dg Abd 1 View  Result Date: 11/06/2018 CLINICAL DATA:  Abdominal distension EXAM: ABDOMEN - 1 VIEW COMPARISON:  CT abdomen pelvis 11/01/2018 FINDINGS: Pigtail drainage catheter RIGHT upper quadrant, by history gallbladder fossa abscess drain. Gaseous distention of ascending colon 13.2 cm. Gas is also seen throughout the transverse colon, less within small bowel and descending sigmoid colon. No bowel wall thickening identified. Bones demineralized. IMPRESSION: Gaseous distention of colon greatest at ascending colon which measures 13.2 cm transverse. Findings called to Dr. Reesa Chew on 11/06/2018 at 1335 hours. Electronically Signed   By: Lavonia Dana M.D.   On: 11/06/2018 13:26      Janett Billow  Charlene BrookeL Aleesa Sweigert , [facility] 11/08/2018, 8:12 AM  Pager: [phone] Mon-Wed, Friday 7:00am-4:30pm Thurs 7am-11:30am  Consults: [phone]

## 2018-11-08 NOTE — Progress Notes (Signed)
PROGRESS NOTE    [name]  ZOX:[id number] DOB: 01-Jun-1942 DOA: 10/31/2018 PCP: Karle PlumberArvind, Calvin M, MD   Brief Narrative:  73 year old with a history of CAD status post CABG, diabetes mellitus type 2, essential hypertension, hyperlipidemia, liver abscess, diverticulitis, cholecystitis status post laparoscopic cholecystectomy came to the hospital with complains of abdominal pain.  Eventually was found to have 7 x 9.9X 9.4 cm fluid collection.  General surgery and iron were consulted.  Patient was started on antibiotics, IR drain was placed on 7//20.   Assessment & Plan:   Principal Problem:   Abscess, intra-abdominal, postoperative Active Problems:   Hx of CABG   Coronary artery disease   Non-insulin dependent type 2 diabetes mellitus (HCC)   AKI (acute kidney injury) (HCC)   RBBB  Abdominal pain secondary to gallbladder fossa abscess Concerns for Bilious drainage.  -Status post IR drain placement on 11/01/18.  Follow-up CT abdomen performed today, shows some improvement in fluid collection in the gallbladder fossa. More fluid drainage from his Drain overnight >250cc/  -Blood culture-negative -Wound culture is growing Klebsiella and enterococcus.  Transitioned patient from IV antibiotics to oral Bactrim and Augmentin twice daily. -Monitor with PO intake.   Colonic, ileus, improving.  -Improved quit a bit. Cont Ambulation and supportive care.   Acute on chronic anemia -Hemoglobin is around 8.0.  Stable.  Continue to monitor this.  CAD status post CABG X4 December 2019 A fib/Flutter, controlled. S/p Bypass -Does not appear to be in any chest pain.  Appears comfortable from cardiac standpoint. -On Eliquis  Diabetes mellitus type 2 -Insulin sliding scale Accu-Chek.  PT/OT evaluation- rec home HH  DVT prophylaxis: SCDs  Code Status: Full code Family Communication: None at bedside Disposition Plan: Feels better but still quite a bit of discharge from his drain. Not safe for  discharge.   Consultants:   Surgery  Radiology  Procedures:   Drain placed 7/4  Antimicrobials:   Augmentin  Bactrim   Subjective: Feels better. Had BM's yesterday, passing gas.  Increase Drain output >250cc/25hrs.   Review of Systems Otherwise negative except as per HPI, including: General = no fevers, chills, dizziness, malaise, fatigue HEENT/EYES = negative for pain, redness, loss of vision, double vision, blurred vision, loss of hearing, sore throat, hoarseness, dysphagia Cardiovascular= negative for chest pain, palpitation, murmurs, lower extremity swelling Respiratory/lungs= negative for shortness of breath, cough, hemoptysis, wheezing, mucus production Gastrointestinal= negative for nausea, vomiting,, abdominal pain, melena, hematemesis Genitourinary= negative for Dysuria, Hematuria, Change in Urinary Frequency MSK = Negative for arthralgia, myalgias, Back Pain, Joint swelling  Neurology= Negative for headache, seizures, numbness, tingling  Psychiatry= Negative for anxiety, depression, suicidal and homocidal ideation Allergy/Immunology= Medication/Food allergy as listed  Skin= Negative for Rash, lesions, ulcers, itching  Objective: Vitals:   11/08/18 0117 11/08/18 0433 11/08/18 0757 11/08/18 0900  BP: 139/78 (!) 152/87 (!) 168/72 (!) 154/85  Pulse: 73  94 85  Resp: 18 16 18    Temp: 99.2 F (37.3 C) 98.2 F (36.8 C) 98.8 F (37.1 C)   TempSrc: Oral Oral Oral   SpO2: 94% 94%    Weight:      Height:        Intake/Output Summary (Last 24 hours) at 11/08/2018 1051 Last data filed at 11/08/2018 0900 Gross per 24 hour  Intake 1051.28 ml  Output 490 ml  Net 561.28 ml   Filed Weights   10/31/18 1720 11/01/18 0140  Weight: 105.7 kg 106.3 kg    Examination: Constitutional: NAD,  calm, comfortable Eyes: PERRL, lids and conjunctivae normal ENMT: Mucous membranes are moist. Posterior pharynx clear of any exudate or lesions.Normal dentition.  Neck: normal,  supple, no masses, no thyromegaly Respiratory: clear to auscultation bilaterally, no wheezing, no crackles. Normal respiratory effort. No accessory muscle use.  Cardiovascular: Regular rate and rhythm, no murmurs / rubs / gallops. No extremity edema. 2+ pedal pulses. No carotid bruits.  Abdomen: no tenderness, no masses palpated. No hepatosplenomegaly. Bowel sounds positive.  Musculoskeletal: no clubbing / cyanosis. No joint deformity upper and lower extremities. Good ROM, no contractures. Normal muscle tone.  Skin: no rashes, lesions, ulcers. No induration Neurologic: CN 2-12 grossly intact. Sensation intact, DTR normal. Strength 5/5 in all 4.  Psychiatric: Normal judgment and insight. Alert and oriented x 3. Normal mood.   Right upper quadrant drain noted with dark blood.  Data Reviewed:   CBC: Recent Labs  Lab 11/02/18 1223 11/02/18 1542  11/03/18 1459 11/04/18 0259 11/05/18 0421 11/06/18 0356 11/07/18 0312 11/08/18 0307  WBC 9.2 15.4*   < > 10.4 12.2* 11.2* 10.3 10.2 6.6  NEUTROABS 8.5* 12.6*  --  8.9*  --   --   --   --   --   HGB 7.6* 7.5*   < > 6.4* 8.0* 8.0* 8.0* 8.2* 7.7*  HCT 24.3* 24.0*   < > 20.6* 24.6* 24.7* 24.5* 25.9* 24.2*  MCV 84.4 83.0   < > 82.1 83.1 83.2 84.5 84.1 84.3  PLT 220 226   < > 196 234 228 322 376 386   < > = values in this interval not displayed.   Basic Metabolic Panel: Recent Labs  Lab 11/04/18 0259 11/05/18 0421 11/06/18 0356 11/07/18 0312 11/08/18 0307  NA 131* 130* 131* 133* 135  K 3.9 3.4* 4.0 3.7 3.6  CL 98 99 97* 98 101  CO2 22 22 24 23 24   GLUCOSE 151* 149* 129* 126* 106*  BUN 8 8 7* 6* 10  CREATININE 1.11 1.13 1.17 1.09 1.24  CALCIUM 8.8* 8.7* 8.9 8.9 8.6*  MG  --   --   --  1.6* 1.9   GFR: Estimated Creatinine Clearance: 62.9 mL/min (by C-G formula based on SCr of 1.24 mg/dL). Liver Function Tests: Recent Labs  Lab 11/02/18 0252 11/03/18 0323 11/04/18 0259 11/05/18 0421 11/06/18 0356  AST 14* 22 19 18 18   ALT 15 22  18 15 16   ALKPHOS 51 64 69 64 59  BILITOT 0.8 0.6 0.6 0.7 0.6  PROT 5.5* 5.3* 5.4* 5.3* 5.6*  ALBUMIN 2.4* 2.3* 2.3* 2.2* 2.3*   Recent Labs  Lab 11/02/18 0252 11/03/18 0323 11/04/18 0259  LIPASE 24 21 19    No results for input(s): AMMONIA in the last 168 hours. Coagulation Profile: No results for input(s): INR, PROTIME in the last 168 hours. Cardiac Enzymes: No results for input(s): CKTOTAL, CKMB, CKMBINDEX, TROPONINI in the last 168 hours. BNP (last 3 results) No results for input(s): PROBNP in the last 8760 hours. HbA1C: No results for input(s): HGBA1C in the last 72 hours. CBG: Recent Labs  Lab 11/07/18 0635 11/07/18 1204 11/07/18 1642 11/07/18 2159 11/08/18 0635  GLUCAP 142* 136* 126* 144* 106*   Lipid Profile: No results for input(s): CHOL, HDL, LDLCALC, TRIG, CHOLHDL, LDLDIRECT in the last 72 hours. Thyroid Function Tests: No results for input(s): TSH, T4TOTAL, FREET4, T3FREE, THYROIDAB in the last 72 hours. Anemia Panel: No results for input(s): VITAMINB12, FOLATE, FERRITIN, TIBC, IRON, RETICCTPCT in the last 72 hours. Sepsis Labs:  No results for input(s): PROCALCITON, LATICACIDVEN in the last 168 hours.  Recent Results (from the past 240 hour(s))  Blood culture (routine x 2)     Status: None   Collection Time: 10/31/18  7:40 PM   Specimen: BLOOD  Result Value Ref Range Status   Specimen Description   Final    BLOOD LEFT HAND Performed at [facility], [address] [place], [place] [address]    Special Requests   Final    BOTTLES DRAWN AEROBIC ONLY Blood Culture results may not be optimal due to an inadequate volume of blood received in culture bottles Performed at [facility], [address] [place], [place] [address]    Culture   Final    NO GROWTH 5 DAYS Performed at [facility], [address], [place], [place] [address]    Report Status 11/05/2018 FINAL  Final  SARS Coronavirus 2 (Hosp order,Performed in [facility]  lab via [employer] ID)     Status: None   Collection Time: 10/31/18  8:52 PM   Specimen: Dry Nasal Swab ([employer] ID Now)  Result Value Ref Range Status   SARS Coronavirus 2 ([employer] Now) NEGATIVE NEGATIVE Final    Comment: (NOTE) SARS-CoV-2 target nucleic acids are NOT DETECTED. The SARS-CoV-2 RNA is generally detectable in upper and lower respiratory specimens during the acute phase of infection.  Negativeresults do not preclude SARS-CoV-2 infection, do not rule out coinfections with other pathogens, and should not be used as the  sole basis for treatment or other patient management decisions.  Negative results must be combined with clinical observations, patient history, and epidemiological information. The expected result is Negative. Fact Sheet for Patients: http://www.graves-ford.org/ Fact Sheet for Healthcare Providers: EnviroConcern.si This test is not yet approved or cleared by the [place] FDA and  has been authorized for detection and/or diagnosis of SARS-CoV-2 by FDA under an Emergency Use Authorization (EUA).  This EUA will remain in effect (meaning this test can be used) for the duration of  the COVID19 declaration under Section 5 64(b)(1) of the Act, 21 U.S.C.  section 3157058469 3(b)(1), unless the authorization is terminated or revoked sooner. Performed at [facility], [address] [place], [place] [address]   Aerobic/Anaerobic Culture (surgical/deep wound)     Status: None   Collection Time: 11/01/18 11:33 AM   Specimen: Abdomen; Abscess  Result Value Ref Range Status   Specimen Description ABDOMEN  Final   Special Requests Normal  Final   Gram Stain   Final    MODERATE WBC PRESENT, PREDOMINANTLY PMN MODERATE GRAM NEGATIVE RODS FEW GRAM POSITIVE COCCI IN PAIRS    Culture   Final    ABUNDANT KLEBSIELLA PNEUMONIAE ABUNDANT ENTEROCOCCUS FAECALIS ABUNDANT PREVOTELLA DENTICOLA BETA LACTAMASE  POSITIVE Performed at [facility], [address], [place], [place] [address]    Report Status 11/06/2018 FINAL  Final   Organism ID, Bacteria ENTEROCOCCUS FAECALIS  Final   Organism ID, Bacteria KLEBSIELLA PNEUMONIAE  Final      Susceptibility   Enterococcus faecalis - MIC*    AMPICILLIN <=2 SENSITIVE Sensitive     VANCOMYCIN 1 SENSITIVE Sensitive     GENTAMICIN SYNERGY SENSITIVE Sensitive     * ABUNDANT ENTEROCOCCUS FAECALIS   Klebsiella pneumoniae - MIC*    AMPICILLIN >=32 RESISTANT Resistant     CEFAZOLIN >=64 RESISTANT Resistant     CEFEPIME <=1 SENSITIVE Sensitive  CEFTAZIDIME 4 SENSITIVE Sensitive     CEFTRIAXONE 4 SENSITIVE Sensitive     CIPROFLOXACIN <=0.25 SENSITIVE Sensitive     GENTAMICIN <=1 SENSITIVE Sensitive     IMIPENEM <=0.25 SENSITIVE Sensitive     TRIMETH/SULFA <=20 SENSITIVE Sensitive     AMPICILLIN/SULBACTAM >=32 RESISTANT Resistant     PIP/TAZO 8 SENSITIVE Sensitive     Extended ESBL NEGATIVE Sensitive     * ABUNDANT KLEBSIELLA PNEUMONIAE         Radiology Studies: Ct Abdomen Wo Contrast  Result Date: 11/07/2018 CLINICAL DATA:  73 year old male with a history of prior gallbladder fossa abscess/hematoma EXAM: CT ABDOMEN WITHOUT CONTRAST TECHNIQUE: Multidetector CT imaging of the abdomen was performed following the standard protocol without IV contrast. COMPARISON:  11/01/2018, 10/31/2018, 06/11/2018 FINDINGS: Lower chest: Atelectasis at the right lung base. Hepatobiliary: Similar appearance flocculent fluid in the gallbladder fossa, status post cholecystectomy, with pigtail drain catheter terminating within the fluid collection. The fluid collection is decreased in size compared to the pre drainage CT, though persists with the greatest diameter 9 cm transverse and 5.9 cm AP. The fluid collection is inseparable from the duodenum and pylorus of the stomach with loss of the fat planes. There is a fat plane maintained between the adjacent hepatic flexure  and the fluid collection, although there is mild colonic wall thickening of the hepatic flexure. Pancreas: Unremarkable pancreas Spleen: Unremarkable spleen Adrenals/Urinary Tract: Unremarkable adrenal glands. Kidneys without hydronephrosis or nephrolithiasis. Similar appearance of cyst on the lower pole of the left kidney. Stomach/Bowel: Transverse colon and hepatic flexure somewhat dilated. Small bowel is decompressed. Stomach is relatively decompressed. Incomplete visualization of the bowel. Vascular/Lymphatic: Atherosclerotic calcifications. No adenopathy of the retroperitoneum. Small lymph nodes at the portacaval nodal stations. Other: None Musculoskeletal: No displaced fracture. Degenerative changes of the spine. IMPRESSION: Persisting flocculent fluid collection in the gallbladder fossa, though decreased in size status post drain placement. Inflammatory changes of the gallbladder fossa abscess are inseparable from the pylorus and descending duodenum. Associated duodenitis not excluded. A fat plane is maintained between the abscess and the hepatic flexure of the colon, however, there is mild colon wall thickening adjacent to the abscess, potentially reactive inflammation. Aortic Atherosclerosis (ICD10-I70.0). Electronically Signed   By: Gilmer MorJaime  Wagner D.O.   On: 11/07/2018 08:47   Dg Abd 1 View  Result Date: 11/08/2018 CLINICAL DATA:  Abdominal distention. Pt states lower abdominal pain x few days with runny stool. Hx of liver 3 abscesses, last one in June. Hx of diabetes, HTN, CAD. EXAM: ABDOMEN - 1 VIEW COMPARISON:  CT, 11/07/2018.  Abdominal radiographs, 11/06/2018. FINDINGS: No bowel dilation to suggest obstruction. There is less gas in the colon than on the prior radiographs, with no current significant colonic distention. Pigtail catheter in the right upper quadrant is stable. IMPRESSION: 1. No evidence of bowel obstruction. Decreased gaseous distention of the colon when compared to the prior  radiographs from 11/06/2018. 2. Stable right upper quadrant pigtail catheter. Electronically Signed   By: Amie Portlandavid  Ormond Price.D.   On: 11/08/2018 09:49   Dg Abd 1 View  Result Date: 11/06/2018 CLINICAL DATA:  Abdominal distension EXAM: ABDOMEN - 1 VIEW COMPARISON:  CT abdomen pelvis 11/01/2018 FINDINGS: Pigtail drainage catheter RIGHT upper quadrant, by history gallbladder fossa abscess drain. Gaseous distention of ascending colon 13.2 cm. Gas is also seen throughout the transverse colon, less within small bowel and descending sigmoid colon. No bowel wall thickening identified. Bones demineralized. IMPRESSION: Gaseous distention of colon greatest at ascending  colon which measures 13.2 cm transverse. Findings called to Dr. Nelson ChimesAmin on 11/06/2018 at 1335 hours. Electronically Signed   By: Ulyses SouthwardMark  Boles Price.D.   On: 11/06/2018 13:26        Scheduled Meds:  amLODipine  5 mg Oral Daily   amoxicillin-clavulanate  1 tablet Oral Q12H   apixaban  5 mg Oral BID   aspirin EC  81 mg Oral Daily   bisacodyl  10 mg Rectal Once   bisacodyl  10 mg Rectal Daily   docusate sodium  100 mg Oral BID   finasteride  5 mg Oral Daily   insulin aspart  0-9 Units Subcutaneous TID WC   metoprolol tartrate  25 mg Oral BID   polyethylene glycol  17 g Oral Daily   sodium chloride flush  5 mL Intracatheter Q8H   sulfamethoxazole-trimethoprim  1 tablet Oral Q12H   Continuous Infusions:    LOS: 8 days   Time spent= 25 mins    Shelbie Franken Joline Maxcyhirag Quinlan Mcfall, MD [facility]  If 7PM-7AM, please contact night-coverage [email or web address] 11/08/2018, 10:51 AM

## 2018-11-09 LAB — GLUCOSE, CAPILLARY
Glucose-Capillary: 105 mg/dL — ABNORMAL HIGH (ref 70–99)
Glucose-Capillary: 107 mg/dL — ABNORMAL HIGH (ref 70–99)
Glucose-Capillary: 113 mg/dL — ABNORMAL HIGH (ref 70–99)
Glucose-Capillary: 115 mg/dL — ABNORMAL HIGH (ref 70–99)
Glucose-Capillary: 119 mg/dL — ABNORMAL HIGH (ref 70–99)

## 2018-11-09 LAB — BASIC METABOLIC PANEL
Anion gap: 12 (ref 5–15)
BUN: 10 mg/dL (ref 8–23)
CO2: 23 mmol/L (ref 22–32)
Calcium: 8.9 mg/dL (ref 8.9–10.3)
Chloride: 100 mmol/L (ref 98–111)
Creatinine, Ser: 1.29 mg/dL — ABNORMAL HIGH (ref 0.61–1.24)
GFR calc Af Amer: >60 mL/min (ref >60)
GFR calc non Af Amer: 53 mL/min — ABNORMAL LOW (ref >60)
Glucose, Bld: 120 mg/dL — ABNORMAL HIGH (ref 70–99)
Potassium: 3.8 mmol/L (ref 3.5–5.1)
Sodium: 135 mmol/L (ref 135–145)

## 2018-11-09 LAB — CBC
HCT: 26.6 % — ABNORMAL LOW (ref 39.0–52.0)
Hemoglobin: 8.4 g/dL — ABNORMAL LOW (ref 13.0–17.0)
MCH: 26.8 pg (ref 26.0–34.0)
MCHC: 31.6 g/dL (ref 30.0–36.0)
MCV: 85 fL (ref 80.0–100.0)
Platelets: 459 K/uL — ABNORMAL HIGH (ref 150–400)
RBC: 3.13 MIL/uL — ABNORMAL LOW (ref 4.22–5.81)
RDW: 17.3 % — ABNORMAL HIGH (ref 11.5–15.5)
WBC: 7.6 K/uL (ref 4.0–10.5)
nRBC: 0 % (ref 0.0–0.2)

## 2018-11-09 LAB — MAGNESIUM: Magnesium: 1.9 mg/dL (ref 1.7–2.4)

## 2018-11-09 MED ORDER — PANTOPRAZOLE SODIUM 40 MG PO TBEC
40.0000 mg | Freq: Two times a day (BID) | ORAL | Status: DC
Start: 2018-11-09 — End: 2018-11-12
  Administered 2018-11-09 – 2018-11-12 (×7): 40 mg via ORAL
  Filled 2018-11-09 (×7): qty 1

## 2018-11-09 MED ORDER — GUAIFENESIN-DM 100-10 MG/5ML PO SYRP
5.0000 mL | ORAL | Status: DC | PRN
Start: 2018-11-09 — End: 2018-11-12
  Administered 2018-11-09 – 2018-11-12 (×6): 5 mL via ORAL
  Filled 2018-11-09 (×6): qty 5

## 2018-11-09 MED ORDER — GUAIFENESIN 100 MG/5ML PO SOLN
5.0000 mL | ORAL | Status: DC | PRN
Start: 2018-11-09 — End: 2018-11-09

## 2018-11-09 NOTE — Progress Notes (Signed)
[facility]/Trauma Progress Note      Assessment/Plan Gallbladder fossa abscess, admission 07/04, S/P lap chole 10/07/18 -S/p IR drain  - cultures showedKlebsiella and enterococcus-transitioned to PO abx 7/9 - CT 7/10 shows fluid collection gallbladder fossa decreased in size, drain in place - this could have been an infected bilioma/hematoma, we will monitor output which has been high the last 2 days.  - HIDA neg for bile leak, suspect this is old bile Colonic ileus - AXR 07/11 showed dilitation is improved - encourage ambulation/OOB. Add daily dulcolax suppository Black stools: - Hgb is up and stable, added protonix, monitor, if they continue may need GI consult.   UVO:ZDGUY healthy/carb mod, protonix VTE: SCD's,Eliquis QI:HKVQQ 07/04>>7/9, augmentin/bactrim 7/9>> Follow up:Dr. Barry Dienes   LOS: 9 days    Subjective: CC: abdominal pain  Pain is improved. He is having intermittent lower abdominal pain. Copious flatus overnight. Hx of GERD per pt. He is having coffee like loose stools that is new over the last few days. No nausea or vomiting.   Objective: Vital signs in last 24 hours: Temp:  [98 F (36.7 C)-99 F (37.2 C)] 98 F (36.7 C) (07/12 0434) Pulse Rate:  [73-85] 73 (07/12 0434) Resp:  [16-18] 16 (07/12 0434) BP: (140-156)/(68-85) 140/68 (07/12 0434) SpO2:  [95 %-97 %] 96 % (07/12 0434) Last BM Date: 11/08/18  Intake/Output from previous day: 07/11 0701 - 07/12 0700 In: 850 [P.O.:840] Out: 440 [Drains:440] Intake/Output this shift: No intake/output data recorded.  PE: Gen: Alert, NAD HEENT: pupils equal and round Pulm: rate and effort normal VZD:GLOV,FIEPP, no distension,+BS,lap choleincisions are well healed,drain with copious dark bilious output,mild TTP of lower abdomen without guarding, no peritonitis  Skin: warm and dry   Anti-infectives: Anti-infectives (From admission, onward)   Start     Dose/Rate Route Frequency  Ordered Stop   11/06/18 1115  amoxicillin-clavulanate (AUGMENTIN) 875-125 MG per tablet 1 tablet     1 tablet Oral Every 12 hours 11/06/18 1110     11/06/18 1115  sulfamethoxazole-trimethoprim (BACTRIM DS) 800-160 MG per tablet 1 tablet     1 tablet Oral Every 12 hours 11/06/18 1110     11/01/18 0600  ceFEPIme (MAXIPIME) 1 g in sodium chloride 0.9 % 100 mL IVPB  Status:  Discontinued     1 g 200 mL/hr over 30 Minutes Intravenous Every 8 hours 10/31/18 1957 11/01/18 0256   11/01/18 0400  piperacillin-tazobactam (ZOSYN) IVPB 3.375 g  Status:  Discontinued     3.375 g 12.5 mL/hr over 240 Minutes Intravenous Every 8 hours 11/01/18 0258 11/06/18 1110   10/31/18 1944  ceFEPIme (MAXIPIME) 2 g injection    Note to Pharmacy: Daine Gravel   : cabinet override      10/31/18 1944 11/01/18 0759   10/31/18 1915  ceFEPIme (MAXIPIME) 2 g in sodium chloride 0.9 % 100 mL IVPB     2 g 200 mL/hr over 30 Minutes Intravenous  Once 10/31/18 1911 10/31/18 2052   10/31/18 1915  metroNIDAZOLE (FLAGYL) IVPB 500 mg     500 mg 100 mL/hr over 60 Minutes Intravenous  Once 10/31/18 1911 10/31/18 2100      Lab Results:  Recent Labs    11/08/18 0307 11/09/18 0330  WBC 6.6 7.6  HGB 7.7* 8.4*  HCT 24.2* 26.6*  PLT 386 459*   BMET Recent Labs    11/08/18 0307 11/09/18 0330  NA 135 135  K 3.6 3.8  CL 101 100  CO2 24 23  GLUCOSE 106* 120*  BUN 10 10  CREATININE 1.24 1.29*  CALCIUM 8.6* 8.9   PT/INR No results for input(s): LABPROT, INR in the last 72 hours. CMP     Component Value Date/Time   NA 135 11/09/2018 0330   NA 132 (L) 05/01/2018 0932   K 3.8 11/09/2018 0330   CL 100 11/09/2018 0330   CO2 23 11/09/2018 0330   GLUCOSE 120 (H) 11/09/2018 0330   BUN 10 11/09/2018 0330   BUN 13 05/01/2018 0932   CREATININE 1.29 (H) 11/09/2018 0330   CALCIUM 8.9 11/09/2018 0330   PROT 5.6 (L) 11/06/2018 0356   ALBUMIN 2.3 (L) 11/06/2018 0356   AST 18 11/06/2018 0356   ALT 16 11/06/2018 0356   ALKPHOS  59 11/06/2018 0356   BILITOT 0.6 11/06/2018 0356   GFRNONAA 53 (L) 11/09/2018 0330   GFRAA >60 11/09/2018 0330   Lipase     Component Value Date/Time   LIPASE 19 11/04/2018 0259    Studies/Results: Dg Abd 1 View  Result Date: 11/08/2018 CLINICAL DATA:  Abdominal distention. Pt states lower abdominal pain x few days with runny stool. Hx of liver 3 abscesses, last one in June. Hx of diabetes, HTN, CAD. EXAM: ABDOMEN - 1 VIEW COMPARISON:  CT, 11/07/2018.  Abdominal radiographs, 11/06/2018. FINDINGS: No bowel dilation to suggest obstruction. There is less gas in the colon than on the prior radiographs, with no current significant colonic distention. Pigtail catheter in the right upper quadrant is stable. IMPRESSION: 1. No evidence of bowel obstruction. Decreased gaseous distention of the colon when compared to the prior radiographs from 11/06/2018. 2. Stable right upper quadrant pigtail catheter. Electronically Signed   By: Amie Portlandavid  Ormond M.D.   On: 11/08/2018 09:49   Nm Hepato Biliary Leak  Result Date: 11/08/2018 CLINICAL DATA:  One month status post cholecystectomy. Abdominal pain and bloating. Evaluate for bile leak. EXAM: NUCLEAR MEDICINE HEPATOBILIARY IMAGING TECHNIQUE: Sequential images of the abdomen were obtained out to 60 minutes following intravenous administration of radiopharmaceutical. RADIOPHARMACEUTICALS:  5.2 mCi Tc-5857m  Choletec IV COMPARISON:  None. FINDINGS: Prompt uptake and biliary excretion of activity by the liver is seen. No gallbladder activity is seen, consistent with prior cholecystectomy. Biliary activity passes into small bowel, consistent with patent common bile duct. No extravasation of radiopharmaceutical activity is seen to suggest the presence of bile leak. IMPRESSION: Status post cholecystectomy. No evidence of postoperative bile leak or biliary obstruction. Electronically Signed   By: Danae OrleansJohn A Stahl M.D.   On: 11/08/2018 14:52      Jerre SimonJessica L Yani Coventry , [facility] 11/09/2018, 8:23 AM  Pager: [phone] Mon-Wed, Friday 7:00am-4:30pm Thurs 7am-11:30am  Consults: [phone]

## 2018-11-09 NOTE — Progress Notes (Signed)
PROGRESS NOTE    [name]  ZOX:[id number] DOB: 1942/10/26 DOA: 10/31/2018 PCP: Karle PlumberArvind, Moogali M, MD   Brief Narrative:  73 year old with a history of CAD status post CABG, diabetes mellitus type 2, essential hypertension, hyperlipidemia, liver abscess, diverticulitis, cholecystitis status post laparoscopic cholecystectomy came to the hospital with complains of abdominal pain.  Eventually was found to have 7 x 9.9X 9.4 cm fluid collection.  General surgery and iron were consulted.  Patient was started on antibiotics, IR drain was placed on 7//20.   Assessment & Plan:   Principal Problem:   Abscess, intra-abdominal, postoperative Active Problems:   Hx of CABG   Coronary artery disease   Non-insulin dependent type 2 diabetes mellitus (HCC)   AKI (acute kidney injury) (HCC)   RBBB  Abdominal pain secondary to gallbladder fossa abscess Status post laparoscopic cholecystectomy -Status post IR drain placement on 11/01/18.  Follow-up CT abdomen performed today, shows some improvement in fluid collection in the gallbladder fossa.  Continues to have increased drainage but HIDA scan is negative for any leak. -Blood culture-negative -Wound culture is growing Klebsiella and enterococcus.  Transitioned patient from IV antibiotics to oral Bactrim and Augmentin twice daily.  Hopefully we can stop this in next 3-5 days -Monitor with PO intake.   Melanotic stool -Hemoglobin is stable.  He is on Eliquis.  PPI added.  We will monitor.  Colonic, ileus, continues to improve -Improved quit a bit. Cont Ambulation and supportive care.  On Dulcolax suppository  Acute on chronic anemia -Hemoglobin is around 8.0.  Stable.  Continue to monitor this.  CAD status post CABG X4 December 2019 A fib/Flutter, controlled. S/p Bypass -Does not appear to be in any chest pain.  Appears comfortable from cardiac standpoint. -On Eliquis  Diabetes mellitus type 2 -Insulin sliding scale Accu-Chek.  PT/OT  evaluation- rec home HH  DVT prophylaxis: SCDs  Code Status: Full code Family Communication: None at bedside Disposition Plan: Continue to monitor him in the hospital.  He still having significant amount of drainage from his drain  Consultants:   Surgery  Radiology  Procedures:   Drain placed 7/4  Antimicrobials:   Augmentin  Bactrim   Subjective: Overall feels better, states he is passing quite a bit of gas.  Continues to have increased drainage from his drain. Ambulating in the hallway with minimal assistance.  Review of Systems Otherwise negative except as per HPI, including: General = no fevers, chills, dizziness, malaise, fatigue HEENT/EYES = negative for pain, redness, loss of vision, double vision, blurred vision, loss of hearing, sore throat, hoarseness, dysphagia Cardiovascular= negative for chest pain, palpitation, murmurs, lower extremity swelling Respiratory/lungs= negative for shortness of breath, cough, hemoptysis, wheezing, mucus production Gastrointestinal= negative for nausea, vomiting,, abdominal pain, melena, hematemesis Genitourinary= negative for Dysuria, Hematuria, Change in Urinary Frequency MSK = Negative for arthralgia, myalgias, Back Pain, Joint swelling  Neurology= Negative for headache, seizures, numbness, tingling  Psychiatry= Negative for anxiety, depression, suicidal and homocidal ideation Allergy/Immunology= Medication/Food allergy as listed  Skin= Negative for Rash, lesions, ulcers, itching   Objective: Vitals:   11/08/18 1628 11/08/18 2053 11/09/18 0434 11/09/18 0853  BP: (!) 156/75 (!) 149/69 140/68 (!) 147/72  Pulse: 81 79 73 (!) 26  Resp: 17 18 16 15   Temp: 99 F (37.2 C) 98.5 F (36.9 C) 98 F (36.7 C) 98 F (36.7 C)  TempSrc: Oral Oral Oral Oral  SpO2: 97% 95% 96% 94%  Weight:      Height:  Intake/Output Summary (Last 24 hours) at 11/09/2018 1153 Last data filed at 11/09/2018 0454 Gross per 24 hour  Intake 610  ml  Output 340 ml  Net 270 ml   Filed Weights   10/31/18 1720 11/01/18 0140  Weight: 105.7 kg 106.3 kg    Examination: Constitutional: NAD, calm, comfortable Eyes: PERRL, lids and conjunctivae normal ENMT: Mucous membranes are moist. Posterior pharynx clear of any exudate or lesions.Normal dentition.  Neck: normal, supple, no masses, no thyromegaly Respiratory: clear to auscultation bilaterally, no wheezing, no crackles. Normal respiratory effort. No accessory muscle use.  Cardiovascular: Regular rate and rhythm, no murmurs / rubs / gallops. No extremity edema. 2+ pedal pulses. No carotid bruits.  Abdomen: no tenderness, no masses palpated. No hepatosplenomegaly. Bowel sounds positive.  Musculoskeletal: no clubbing / cyanosis. No joint deformity upper and lower extremities. Good ROM, no contractures. Normal muscle tone.  Skin: no rashes, lesions, ulcers. No induration Neurologic: CN 2-12 grossly intact. Sensation intact, DTR normal. Strength 4+/5 in all 4.  Psychiatric: Normal judgment and insight. Alert and oriented x 3. Normal mood.  . Alert and oriented x 3. Normal mood.   Right upper quadrant drain noted with dark blood.  Data Reviewed:   CBC: Recent Labs  Lab 11/02/18 1223 11/02/18 1542  11/03/18 1459  11/05/18 0421 11/06/18 0356 11/07/18 0312 11/08/18 0307 11/09/18 0330  WBC 9.2 15.4*   < > 10.4   < > 11.2* 10.3 10.2 6.6 7.6  NEUTROABS 8.5* 12.6*  --  8.9*  --   --   --   --   --   --   HGB 7.6* 7.5*   < > 6.4*   < > 8.0* 8.0* 8.2* 7.7* 8.4*  HCT 24.3* 24.0*   < > 20.6*   < > 24.7* 24.5* 25.9* 24.2* 26.6*  MCV 84.4 83.0   < > 82.1   < > 83.2 84.5 84.1 84.3 85.0  PLT 220 226   < > 196   < > 228 322 376 386 459*   < > = values in this interval not displayed.   Basic Metabolic Panel: Recent Labs  Lab 11/05/18 0421 11/06/18 0356 11/07/18 0312 11/08/18 0307 11/09/18 0330  NA 130* 131* 133* 135 135  K 3.4* 4.0 3.7 3.6 3.8  CL 99 97* 98 101 100  CO2 22 24 23 24  23   GLUCOSE 149* 129* 126* 106* 120*  BUN 8 7* 6* 10 10  CREATININE 1.13 1.17 1.09 1.24 1.29*  CALCIUM 8.7* 8.9 8.9 8.6* 8.9  MG  --   --  1.6* 1.9 1.9   GFR: Estimated Creatinine Clearance: 60.4 mL/min (A) (by C-G formula based on SCr of 1.29 mg/dL (H)). Liver Function Tests: Recent Labs  Lab 11/03/18 0323 11/04/18 0259 11/05/18 0421 11/06/18 0356  AST 22 19 18 18   ALT 22 18 15 16   ALKPHOS 64 69 64 59  BILITOT 0.6 0.6 0.7 0.6  PROT 5.3* 5.4* 5.3* 5.6*  ALBUMIN 2.3* 2.3* 2.2* 2.3*   Recent Labs  Lab 11/03/18 0323 11/04/18 0259  LIPASE 21 19   No results for input(s): AMMONIA in the last 168 hours. Coagulation Profile: No results for input(s): INR, PROTIME in the last 168 hours. Cardiac Enzymes: No results for input(s): CKTOTAL, CKMB, CKMBINDEX, TROPONINI in the last 168 hours. BNP (last 3 results) No results for input(s): PROBNP in the last 8760 hours. HbA1C: No results for input(s): HGBA1C in the last 72 hours. CBG: Recent Labs  Lab 11/08/18 0635 11/08/18 1234 11/08/18 1626 11/08/18 2142 11/09/18 0623  GLUCAP 106* 117* 129* 129* 107*   Lipid Profile: No results for input(s): CHOL, HDL, LDLCALC, TRIG, CHOLHDL, LDLDIRECT in the last 72 hours. Thyroid Function Tests: No results for input(s): TSH, T4TOTAL, FREET4, T3FREE, THYROIDAB in the last 72 hours. Anemia Panel: No results for input(s): VITAMINB12, FOLATE, FERRITIN, TIBC, IRON, RETICCTPCT in the last 72 hours. Sepsis Labs: No results for input(s): PROCALCITON, LATICACIDVEN in the last 168 hours.  Recent Results (from the past 240 hour(s))  Blood culture (routine x 2)     Status: None   Collection Time: 10/31/18  7:40 PM   Specimen: BLOOD  Result Value Ref Range Status   Specimen Description   Final    BLOOD LEFT HAND Performed at [facility], [address] [place], [place] [address]    Special Requests   Final    BOTTLES DRAWN AEROBIC ONLY Blood Culture results may not be optimal due to an  inadequate volume of blood received in culture bottles Performed at [facility], [address] [place], [place] [address]    Culture   Final    NO GROWTH 5 DAYS Performed at [facility], [address] [place], [place] [address]    Report Status 11/05/2018 FINAL  Final  SARS Coronavirus 2 (Hosp order,Performed in [facility] lab via [employer] ID)     Status: None   Collection Time: 10/31/18  8:52 PM   Specimen: Dry Nasal Swab (Abbott ID Now)  Result Value Ref Range Status   SARS Coronavirus 2 (Abbott ID Now) NEGATIVE NEGATIVE Final    Comment: (NOTE) SARS-CoV-2 target nucleic acids are NOT DETECTED. The SARS-CoV-2 RNA is generally detectable in upper and lower respiratory specimens during the acute phase of infection.  Negativeresults do not preclude SARS-CoV-2 infection, do not rule out coinfections with other pathogens, and should not be used as the  sole basis for treatment or other patient management decisions.  Negative results must be combined with clinical observations, patient history, and epidemiological information. The expected result is Negative. Fact Sheet for Patients: http://www.graves-ford.org/https://www.fda.gov/media/136524/download Fact Sheet for Healthcare Providers: EnviroConcern.sihttps://www.fda.gov/media/136523/download This test is not yet approved or cleared by the [place] FDA and  has been authorized for detection and/or diagnosis of SARS-CoV-2 by FDA under an Emergency Use Authorization (EUA).  This EUA will remain in effect (meaning this test can be used) for the duration of  the COVID19 declaration under Section 5 64(b)(1) of the Act, 21 U.S.C.  section [id number] 3(b)(1), unless the authorization is terminated or revoked sooner. Performed at [facility] [place], [address] [place], [place] [address]   Aerobic/Anaerobic Culture (surgical/deep wound)     Status: None   Collection Time: 11/01/18 11:33 AM   Specimen: Abdomen; Abscess  Result Value Ref Range  Status   Specimen Description ABDOMEN  Final   Special Requests Normal  Final   Gram Stain   Final    MODERATE WBC PRESENT, PREDOMINANTLY PMN MODERATE GRAM NEGATIVE RODS FEW GRAM POSITIVE COCCI IN PAIRS    Culture   Final    ABUNDANT KLEBSIELLA PNEUMONIAE ABUNDANT ENTEROCOCCUS FAECALIS ABUNDANT PREVOTELLA DENTICOLA BETA LACTAMASE POSITIVE Performed at [facility], [address] [place], [place] [address]    Report Status 11/06/2018 FINAL  Final   Organism ID, Bacteria ENTEROCOCCUS FAECALIS  Final   Organism ID, Bacteria KLEBSIELLA PNEUMONIAE  Final  Susceptibility   Enterococcus faecalis - MIC*    AMPICILLIN <=2 SENSITIVE Sensitive     VANCOMYCIN 1 SENSITIVE Sensitive     GENTAMICIN SYNERGY SENSITIVE Sensitive     * ABUNDANT ENTEROCOCCUS FAECALIS   Klebsiella pneumoniae - MIC*    AMPICILLIN >=32 RESISTANT Resistant     CEFAZOLIN >=64 RESISTANT Resistant     CEFEPIME <=1 SENSITIVE Sensitive     CEFTAZIDIME 4 SENSITIVE Sensitive     CEFTRIAXONE 4 SENSITIVE Sensitive     CIPROFLOXACIN <=0.25 SENSITIVE Sensitive     GENTAMICIN <=1 SENSITIVE Sensitive     IMIPENEM <=0.25 SENSITIVE Sensitive     TRIMETH/SULFA <=20 SENSITIVE Sensitive     AMPICILLIN/SULBACTAM >=32 RESISTANT Resistant     PIP/TAZO 8 SENSITIVE Sensitive     Extended ESBL NEGATIVE Sensitive     * ABUNDANT KLEBSIELLA PNEUMONIAE         Radiology Studies: Dg Abd 1 View  Result Date: 11/08/2018 CLINICAL DATA:  Abdominal distention. Pt states lower abdominal pain x few days with runny stool. Hx of liver 3 abscesses, last one in June. Hx of diabetes, HTN, CAD. EXAM: ABDOMEN - 1 VIEW COMPARISON:  CT, 11/07/2018.  Abdominal radiographs, 11/06/2018. FINDINGS: No bowel dilation to suggest obstruction. There is less gas in the colon than on the prior radiographs, with no current significant colonic distention. Pigtail catheter in the right upper quadrant is stable. IMPRESSION: 1. No evidence of bowel  obstruction. Decreased gaseous distention of the colon when compared to the prior radiographs from 11/06/2018. 2. Stable right upper quadrant pigtail catheter. Electronically Signed   By: Amie Portlandavid  Ormond M.D.   On: 11/08/2018 09:49   Nm Hepato Biliary Leak  Result Date: 11/08/2018 CLINICAL DATA:  One month status post cholecystectomy. Abdominal pain and bloating. Evaluate for bile leak. EXAM: NUCLEAR MEDICINE HEPATOBILIARY IMAGING TECHNIQUE: Sequential images of the abdomen were obtained out to 60 minutes following intravenous administration of radiopharmaceutical. RADIOPHARMACEUTICALS:  5.2 mCi Tc-3162m  Choletec IV COMPARISON:  None. FINDINGS: Prompt uptake and biliary excretion of activity by the liver is seen. No gallbladder activity is seen, consistent with prior cholecystectomy. Biliary activity passes into small bowel, consistent with patent common bile duct. No extravasation of radiopharmaceutical activity is seen to suggest the presence of bile leak. IMPRESSION: Status post cholecystectomy. No evidence of postoperative bile leak or biliary obstruction. Electronically Signed   By: Danae OrleansJohn A Stahl M.D.   On: 11/08/2018 14:52        Scheduled Meds:  amLODipine  5 mg Oral Daily   amoxicillin-clavulanate  1 tablet Oral Q12H   apixaban  5 mg Oral BID   aspirin EC  81 mg Oral Daily   bisacodyl  10 mg Rectal Daily   docusate sodium  100 mg Oral BID   finasteride  5 mg Oral Daily   insulin aspart  0-9 Units Subcutaneous TID WC   metoprolol tartrate  25 mg Oral BID   pantoprazole  40 mg Oral BID   polyethylene glycol  17 g Oral Daily   sodium chloride flush  5 mL Intracatheter Q8H   sulfamethoxazole-trimethoprim  1 tablet Oral Q12H   Continuous Infusions:    LOS: 9 days   Time spent= 25 mins    Tranika Scholler Joline Maxcyhirag Jadalee Westcott, MD [facility]  If 7PM-7AM, please contact night-coverage [email or web address] 11/09/2018, 11:53 AM

## 2018-11-10 LAB — MAGNESIUM: Magnesium: 2 mg/dL (ref 1.7–2.4)

## 2018-11-10 LAB — BASIC METABOLIC PANEL
Anion gap: 11 (ref 5–15)
BUN: 11 mg/dL (ref 8–23)
CO2: 23 mmol/L (ref 22–32)
Calcium: 8.8 mg/dL — ABNORMAL LOW (ref 8.9–10.3)
Chloride: 100 mmol/L (ref 98–111)
Creatinine, Ser: 1.39 mg/dL — ABNORMAL HIGH (ref 0.61–1.24)
GFR calc Af Amer: 57 mL/min — ABNORMAL LOW (ref >60)
GFR calc non Af Amer: 49 mL/min — ABNORMAL LOW (ref >60)
Glucose, Bld: 117 mg/dL — ABNORMAL HIGH (ref 70–99)
Potassium: 3.8 mmol/L (ref 3.5–5.1)
Sodium: 134 mmol/L — ABNORMAL LOW (ref 135–145)

## 2018-11-10 LAB — GLUCOSE, CAPILLARY
Glucose-Capillary: 115 mg/dL — ABNORMAL HIGH (ref 70–99)
Glucose-Capillary: 96 mg/dL (ref 70–99)
Glucose-Capillary: 131 mg/dL — ABNORMAL HIGH (ref 70–99)
Glucose-Capillary: 101 mg/dL — ABNORMAL HIGH (ref 70–99)

## 2018-11-10 LAB — CBC
HCT: 27.8 % — ABNORMAL LOW (ref 39.0–52.0)
Hemoglobin: 8.6 g/dL — ABNORMAL LOW (ref 13.0–17.0)
MCH: 26.3 pg (ref 26.0–34.0)
MCHC: 30.9 g/dL (ref 30.0–36.0)
MCV: 85 fL (ref 80.0–100.0)
Platelets: 481 K/uL — ABNORMAL HIGH (ref 150–400)
RBC: 3.27 MIL/uL — ABNORMAL LOW (ref 4.22–5.81)
RDW: 17.7 % — ABNORMAL HIGH (ref 11.5–15.5)
WBC: 9.3 K/uL (ref 4.0–10.5)
nRBC: 0 % (ref 0.0–0.2)

## 2018-11-10 NOTE — Progress Notes (Signed)
Progress Note: General Surgery Service   Assessment/Plan: Principal Problem:   Abscess, intra-abdominal, postoperative Active Problems:   Hx of CABG   Coronary artery disease   Non-insulin dependent type 2 diabetes mellitus (HCC)   AKI (acute kidney injury) (HCC)   RBBB  Gallbladder fossa abscess, admission 07/04, S/P lap chole 10/07/18 -S/p IR drain  - cultures showedKlebsiella and enterococcus-transitioned to PO abx 7/9 - CT 7/10 shows fluid collection gallbladder fossa decreased in size, drain in place - this could have been an infected bilioma/hematoma, we will monitor output which has been high the last 2 days.  - HIDA neg for bile leak  Colonic ileus - AXR 07/11 showed dilitation is improved - encourage ambulation/OOB. Add daily dulcolax suppository  VOZ:DGUYQFEN:heart healthy/carb mod, protonix VTE: SCD's,Eliquis IH:KVQQV:Zosyn 07/04>>7/9, augmentin/bactrim 7/9>> Follow up:Dr. Donell BeersByerly   LOS: 10 days  Chief Complaint/Subjective: Pain controlled, tolerating diet  Objective: Vital signs in last 24 hours: Temp:  [98.1 F (36.7 C)-98.9 F (37.2 C)] 98.9 F (37.2 C) (07/13 1237) Pulse Rate:  [63-80] 63 (07/13 1237) Resp:  [15-23] 18 (07/13 1237) BP: (123-141)/(64-75) 123/64 (07/13 1237) SpO2:  [94 %-98 %] 96 % (07/13 1237) Last BM Date: 11/10/18  Intake/Output from previous day: 07/12 0701 - 07/13 0700 In: 970 [P.O.:960] Out: 105 [Drains:105] Intake/Output this shift: Total I/O In: 248 [P.O.:240; I.V.:3; Other:5] Out: 80 [Drains:80]  Lungs: nonlabored  Cardiovascular: RRR  Abd: soft, incision c/d/i, drain with green liquid  Extremities: no edema  Neuro: AOx4  Lab Results: CBC  Recent Labs    11/09/18 0330 11/10/18 0436  WBC 7.6 9.3  HGB 8.4* 8.6*  HCT 26.6* 27.8*  PLT 459* 481*   BMET Recent Labs    11/09/18 0330 11/10/18 0436  NA 135 134*  K 3.8 3.8  CL 100 100  CO2 23 23  GLUCOSE 120* 117*  BUN 10 11  CREATININE 1.29* 1.39*   CALCIUM 8.9 8.8*   PT/INR No results for input(s): LABPROT, INR in the last 72 hours. ABG No results for input(s): PHART, HCO3 in the last 72 hours.  Invalid input(s): PCO2, PO2  Studies/Results:  Anti-infectives: Anti-infectives (From admission, onward)   Start     Dose/Rate Route Frequency Ordered Stop   11/06/18 1115  amoxicillin-clavulanate (AUGMENTIN) 875-125 MG per tablet 1 tablet     1 tablet Oral Every 12 hours 11/06/18 1110     11/06/18 1115  sulfamethoxazole-trimethoprim (BACTRIM DS) 800-160 MG per tablet 1 tablet     1 tablet Oral Every 12 hours 11/06/18 1110     11/01/18 0600  ceFEPIme (MAXIPIME) 1 g in sodium chloride 0.9 % 100 mL IVPB  Status:  Discontinued     1 g 200 mL/hr over 30 Minutes Intravenous Every 8 hours 10/31/18 1957 11/01/18 0256   11/01/18 0400  piperacillin-tazobactam (ZOSYN) IVPB 3.375 g  Status:  Discontinued     3.375 g 12.5 mL/hr over 240 Minutes Intravenous Every 8 hours 11/01/18 0258 11/06/18 1110   10/31/18 1944  ceFEPIme (MAXIPIME) 2 g injection    Note to Pharmacy: Bartholomew Crewsavis, Jennaya   : cabinet override      10/31/18 1944 11/01/18 0759   10/31/18 1915  ceFEPIme (MAXIPIME) 2 g in sodium chloride 0.9 % 100 mL IVPB     2 g 200 mL/hr over 30 Minutes Intravenous  Once 10/31/18 1911 10/31/18 2052   10/31/18 1915  metroNIDAZOLE (FLAGYL) IVPB 500 mg     500 mg 100 mL/hr over 60 Minutes  Intravenous  Once 10/31/18 1911 10/31/18 2100      Medications: Scheduled Meds: . amLODipine  5 mg Oral Daily  . amoxicillin-clavulanate  1 tablet Oral Q12H  . apixaban  5 mg Oral BID  . aspirin EC  81 mg Oral Daily  . bisacodyl  10 mg Rectal Daily  . docusate sodium  100 mg Oral BID  . finasteride  5 mg Oral Daily  . insulin aspart  0-9 Units Subcutaneous TID WC  . metoprolol tartrate  25 mg Oral BID  . pantoprazole  40 mg Oral BID  . polyethylene glycol  17 g Oral Daily  . sodium chloride flush  5 mL Intracatheter Q8H  . sulfamethoxazole-trimethoprim  1  tablet Oral Q12H   Continuous Infusions: PRN Meds:.acetaminophen **OR** acetaminophen, guaiFENesin-dextromethorphan, HYDROcodone-acetaminophen, HYDROmorphone (DILAUDID) injection, metoCLOPramide (REGLAN) injection, ondansetron (ZOFRAN) IV, oxyCODONE, senna-docusate, technetium TC 48M mebrofenin  Mickeal Skinner, MD [facility]

## 2018-11-10 NOTE — Progress Notes (Signed)
PT Cancellation Note  Patient Details Name: [name] MRN: [id number] DOB: 24-Oct-1942   Cancelled Treatment:    Reason Eval/Treat Not Completed: Other (comment)(Pt had walked earlier.  Will follow up on Tuesday pm per pt request.)   [name] 11/10/2018, 12:46 PM [name],PT Acute Rehabilitation Services Pager:  [phone]  Office:  [phone]

## 2018-11-10 NOTE — Progress Notes (Signed)
Occupational Therapy Treatment Patient Details Name: [name] MRN: [id number] DOB: June 08, 1942 Today's Date: 11/10/2018    History of present illness [name] is a 73 y.o. male with medical history significant of CAD status post CABG x4, type 2 diabetes, hypertension, hyperlipidemia, diverticulitis, liver abscess, cholecystitis status post percutaneous cholecystostomy tube 1/12 and eventually lap chole on 6/9 [facility] with complaints of abdominal pain..  S/P drain placement for old hematoma.   OT comments  Patient progressing well.  Completing bed mobility and transfers in room without AD independently.  Grooming with modified independence, simulated shower transfer and bathing with supervision. Used RW for hallway mobility with supervision for safety, no losses of balance and improving activity tolerance.  Initiated education on energy conservation techniques. Updated dc plan as below.  Will follow acutely to incorporate energy conservation techniques into ADLs.    Follow Up Recommendations  No OT follow up    Equipment Recommendations  None recommended by OT    Recommendations for Other Services      Precautions / Restrictions Precautions Precautions: Fall Restrictions Weight Bearing Restrictions: No       Mobility Bed Mobility Overal bed mobility: Independent             General bed mobility comments: no assist, HOB 0 degrees  Transfers Overall transfer level: Needs assistance Equipment used: None Transfers: Sit to/from Stand Sit to Stand: Independent         General transfer comment: no assist required    Balance Overall balance assessment: Needs assistance Sitting-balance support: No upper extremity supported Sitting balance-Leahy Scale: Good     Standing balance support: No upper extremity supported;Bilateral upper extremity supported;During functional activity Standing balance-Leahy Scale: Good Standing balance  comment: pt used RW for mobility in hallway, no RW in room; no losses of balance                            ADL either performed or assessed with clinical judgement   ADL Overall ADL's : Needs assistance/impaired     Grooming: Wash/dry hands;Independent;Standing       Lower Body Bathing: Supervison/ safety;Sit to/from stand Lower Body Bathing Details (indicate cue type and reason): simulated using grabbars standing in shower  Upper Body Dressing : Set up;Standing Upper Body Dressing Details (indicate cue type and reason): to don 2nd gown Lower Body Dressing: Supervision/safety;Sit to/from stand Lower Body Dressing Details (indicate cue type and reason): simulated donning socks figure 4 technique; sit to stand without assist Toilet Transfer: Independent;Ambulation Toilet Transfer Details (indicate cue type and reason): simulated          Functional mobility during ADLs: Supervision/safety;Rolling walker General ADL Comments: pt completed grooming at sink, func mobility in hallway, then returned to room to complete shower transfer     Vision       Perception     Praxis      Cognition Arousal/Alertness: Awake/alert Behavior During [facility] for tasks assessed/performed Overall Cognitive Status: Within Functional Limits for tasks assessed                                          Exercises     Shoulder Instructions       General Comments reviewed energy conservation techniques with patient - verbalized understanding  Pertinent Vitals/ Pain       Pain Assessment: No/denies pain  Home Living                                          Prior Functioning/Environment              Frequency  Min 2X/week        Progress Toward Goals  OT Goals(current goals can now be found in the care plan section)  Progress towards OT goals: Progressing toward goals  Acute Rehab OT Goals Patient Stated Goal: back to  playing golf. OT Goal Formulation: With patient  Plan Discharge plan needs to be updated;Frequency remains appropriate    Co-evaluation                 AM-PAC OT "6 Clicks" Daily Activity     Outcome Measure   Help from another person eating meals?: None Help from another person taking care of personal grooming?: None Help from another person toileting, which includes using toliet, bedpan, or urinal?: None Help from another person bathing (including washing, rinsing, drying)?: None Help from another person to put on and taking off regular upper body clothing?: None Help from another person to put on and taking off regular lower body clothing?: None 6 Click Score: 24    End of Session Equipment Utilized During Treatment: Rolling walker  OT Visit Diagnosis: Other abnormalities of gait and mobility (R26.89);Muscle weakness (generalized) (M62.81);Other (comment)   Activity Tolerance Patient tolerated treatment well   Patient Left in chair;with call bell/phone within reach   Nurse Communication Mobility status        Time: 5726-2035 OT Time Calculation (min): 19 min  Charges: OT General Charges $OT Visit: 1 Visit OT Treatments $Self Care/Home Management : 8-22 mins  Delight Stare, [place] Pager [phone] Office [phone]    Delight Stare 11/10/2018, 4:49 PM

## 2018-11-10 NOTE — Progress Notes (Signed)
Referring Physician(s): Byerly,F  Supervising Physician: Arne Cleveland  Patient Status:  [facility] - In-pt  Chief Complaint: Abdominal pain, gallbladder fossa abscess   Subjective: Pt sitting up in chair; states he feels better; passing flatus and having loose stools, was dark but lightening up some per pt; denies N/V; some soreness at RUQ drain site   Allergies: Ciprofloxacin and Erythromycin ethylsuccinate  Medications: Prior to Admission medications   Medication Sig Start Date End Date Taking? Authorizing Provider  acetaminophen (TYLENOL) 500 MG tablet Take 1,000 mg by mouth daily as needed for moderate pain.   Yes [provider]  amiodarone (PACERONE) 200 MG tablet TAKE 1 TABLET BY MOUTH DAILY Patient taking differently: Take 200 mg by mouth daily.  07/04/18  Yes Lorretta Harp, MD  amLODipine (NORVASC) 5 MG tablet Take 5 mg by mouth daily. 02/05/18  Yes [provider]  aspirin EC 81 MG tablet Take 1 tablet (81 mg total) by mouth daily. Patient taking differently: Take 81 mg by mouth every evening.  05/01/18  Yes Kilroy, Doreene Burke, PA-C  Cholecalciferol (VITAMIN D3) 2000 units TABS Take 2,000 Units by mouth 2 (two) times daily.    Yes [provider]  docusate sodium (COLACE) 100 MG capsule Take 100 mg by mouth 2 (two) times daily.    Yes [provider]  ELIQUIS 5 MG TABS tablet TAKE ONE TABLET BY MOUTH TWICE DAILY 06/03/18  Yes Lorretta Harp, MD  finasteride (PROSCAR) 5 MG tablet Take 5 mg by mouth daily. 02/05/18  Yes [provider]  loratadine (CLARITIN) 10 MG tablet Take 10 mg by mouth daily.   Yes [provider]  metFORMIN (GLUCOPHAGE) 1000 MG tablet Take 1,000 mg by mouth 2 (two) times daily. 02/05/18  Yes [provider]  metoprolol tartrate (LOPRESSOR) 25 MG tablet Take 1 tablet (25 mg total) by mouth 2 (two) times daily. 05/20/18  Yes Lorretta Harp, MD  Multiple Vitamins-Minerals (MULTIVITAMIN ADULT PO)  Take 1 tablet by mouth daily.   Yes [provider]  oxyCODONE (OXY IR/ROXICODONE) 5 MG immediate release tablet Take 1 tablet (5 mg total) by mouth every 6 (six) hours as needed for severe pain. Patient not taking: Reported on 11/01/2018 10/07/18   [name], MD     Vital Signs: BP (!) 141/65 (BP Location: Left Arm)    Pulse 74    Temp 98.2 F (36.8 C) (Oral)    Resp 16    Ht 5\' 11"  (1.803 m)    Wt 234 lb 4.8 oz (106.3 kg)    SpO2 94%    BMI 32.68 kg/m   Physical Exam awake/alert; RUQ drain intact, insertion site ok, sl tender, output 50 cc bilious fluid  Imaging: Ct Abdomen Wo Contrast  Result Date: 11/07/2018 CLINICAL DATA:  73 year old male with a history of prior gallbladder fossa abscess/hematoma EXAM: CT ABDOMEN WITHOUT CONTRAST TECHNIQUE: Multidetector CT imaging of the abdomen was performed following the standard protocol without IV contrast. COMPARISON:  11/01/2018, 10/31/2018, 06/11/2018 FINDINGS: Lower chest: Atelectasis at the right lung base. Hepatobiliary: Similar appearance flocculent fluid in the gallbladder fossa, status post cholecystectomy, with pigtail drain catheter terminating within the fluid collection. The fluid collection is decreased in size compared to the pre drainage CT, though persists with the greatest diameter 9 cm transverse and 5.9 cm AP. The fluid collection is inseparable from the duodenum and pylorus of the stomach with loss of the fat planes. There is a fat plane  maintained between the adjacent hepatic flexure and the fluid collection, although there is mild colonic wall thickening of the hepatic flexure. Pancreas: Unremarkable pancreas Spleen: Unremarkable spleen Adrenals/Urinary Tract: Unremarkable adrenal glands. Kidneys without hydronephrosis or nephrolithiasis. Similar appearance of cyst on the lower pole of the left kidney. Stomach/Bowel: Transverse colon and hepatic flexure somewhat dilated. Small bowel is decompressed. Stomach is relatively  decompressed. Incomplete visualization of the bowel. Vascular/Lymphatic: Atherosclerotic calcifications. No adenopathy of the retroperitoneum. Small lymph nodes at the portacaval nodal stations. Other: None Musculoskeletal: No displaced fracture. Degenerative changes of the spine. IMPRESSION: Persisting flocculent fluid collection in the gallbladder fossa, though decreased in size status post drain placement. Inflammatory changes of the gallbladder fossa abscess are inseparable from the pylorus and descending duodenum. Associated duodenitis not excluded. A fat plane is maintained between the abscess and the hepatic flexure of the colon, however, there is mild colon wall thickening adjacent to the abscess, potentially reactive inflammation. Aortic Atherosclerosis (ICD10-I70.0). Electronically Signed   By: Gilmer MorJaime  Wagner D.O.   On: 11/07/2018 08:47   Dg Abd 1 View  Result Date: 11/08/2018 CLINICAL DATA:  Abdominal distention. Pt states lower abdominal pain x few days with runny stool. Hx of liver 3 abscesses, last one in June. Hx of diabetes, HTN, CAD. EXAM: ABDOMEN - 1 VIEW COMPARISON:  CT, 11/07/2018.  Abdominal radiographs, 11/06/2018. FINDINGS: No bowel dilation to suggest obstruction. There is less gas in the colon than on the prior radiographs, with no current significant colonic distention. Pigtail catheter in the right upper quadrant is stable. IMPRESSION: 1. No evidence of bowel obstruction. Decreased gaseous distention of the colon when compared to the prior radiographs from 11/06/2018. 2. Stable right upper quadrant pigtail catheter. Electronically Signed   By: Amie Portlandavid  Ormond M.D.   On: 11/08/2018 09:49   Dg Abd 1 View  Result Date: 11/06/2018 CLINICAL DATA:  Abdominal distension EXAM: ABDOMEN - 1 VIEW COMPARISON:  CT abdomen pelvis 11/01/2018 FINDINGS: Pigtail drainage catheter RIGHT upper quadrant, by history gallbladder fossa abscess drain. Gaseous distention of ascending colon 13.2 cm. Gas is also  seen throughout the transverse colon, less within small bowel and descending sigmoid colon. No bowel wall thickening identified. Bones demineralized. IMPRESSION: Gaseous distention of colon greatest at ascending colon which measures 13.2 cm transverse. Findings called to Dr. Nelson ChimesAmin on 11/06/2018 at 1335 hours. Electronically Signed   By: Ulyses SouthwardMark  Boles M.D.   On: 11/06/2018 13:26   Nm Hepato Biliary Leak  Result Date: 11/08/2018 CLINICAL DATA:  One month status post cholecystectomy. Abdominal pain and bloating. Evaluate for bile leak. EXAM: NUCLEAR MEDICINE HEPATOBILIARY IMAGING TECHNIQUE: Sequential images of the abdomen were obtained out to 60 minutes following intravenous administration of radiopharmaceutical. RADIOPHARMACEUTICALS:  5.2 mCi Tc-4234m  Choletec IV COMPARISON:  None. FINDINGS: Prompt uptake and biliary excretion of activity by the liver is seen. No gallbladder activity is seen, consistent with prior cholecystectomy. Biliary activity passes into small bowel, consistent with patent common bile duct. No extravasation of radiopharmaceutical activity is seen to suggest the presence of bile leak. IMPRESSION: Status post cholecystectomy. No evidence of postoperative bile leak or biliary obstruction. Electronically Signed   By: Danae OrleansJohn A Stahl M.D.   On: 11/08/2018 14:52    Labs:  CBC: Recent Labs    11/07/18 0312 11/08/18 0307 11/09/18 0330 11/10/18 0436  WBC 10.2 6.6 7.6 9.3  HGB 8.2* 7.7* 8.4* 8.6*  HCT 25.9* 24.2* 26.6* 27.8*  PLT 376 386 459* 481*    COAGS: Recent Labs  03/14/18 1610 03/26/18 1146 03/31/18 1458 04/08/18 0238 05/10/18 0907 10/06/18 0903  INR 0.96 0.96 1.37 1.06 1.42 0.9  APTT 32 31 29  --   --   --     BMP: Recent Labs    11/07/18 0312 11/08/18 0307 11/09/18 0330 11/10/18 0436  NA 133* 135 135 134*  K 3.7 3.6 3.8 3.8  CL 98 101 100 100  CO2 23 24 23 23   GLUCOSE 126* 106* 120* 117*  BUN 6* 10 10 11   CALCIUM 8.9 8.6* 8.9 8.8*  CREATININE 1.09 1.24  1.29* 1.39*  GFRNONAA >60 56* 53* 49*  GFRAA >60 >60 >60 57*    LIVER FUNCTION TESTS: Recent Labs    11/03/18 0323 11/04/18 0259 11/05/18 0421 11/06/18 0356  BILITOT 0.6 0.6 0.7 0.6  AST 22 19 18 18   ALT 22 18 15 16   ALKPHOS 64 69 64 59  PROT 5.3* 5.4* 5.3* 5.6*  ALBUMIN 2.3* 2.3* 2.2* 2.3*    Assessment and Plan: Pt s/p cholecystectomy 6/9 with subsequent GB fossa abscess/infected hematoma, s/p drain placement 7/4;afebrile; WBC nl; hgb 8.698.4), creat 1.39(1.29); f/u CT with persistent GB fossa collection , sl smaller- output more bilious now; inflamm changes of GB fossa/pylorus/desc duodenum; no evidence of bile leak by NM study 7/11; cont current tx; consider drain injection before removing; may need GI consult esp if bilious output continues    Electronically Signed: D. Jeananne RamaKevin Aster Eckrich, PA-C 11/10/2018, 11:42 AM   I spent a total of 15 minutes  at the the patient's bedside AND on the patient's hospital floor or unit, greater than 50% of which was counseling/coordinating care for gallbladder fossa drain    Patient ID: [name], male   DOB: 09/14/1942, 73 y.o.   MRN: [id number]

## 2018-11-10 NOTE — Progress Notes (Signed)
PROGRESS NOTE    Kizzie FantasiaLarry F Yetman  ZOX:[id number] DOB: 1942-06-05 DOA: 10/31/2018 PCP: Karle PlumberArvind, Moogali M, MD   Brief Narrative:  73 year old with a history of CAD status post CABG, diabetes mellitus type 2, essential hypertension, hyperlipidemia, liver abscess, diverticulitis, cholecystitis status post laparoscopic cholecystectomy came to the hospital with complains of abdominal pain.  Eventually was found to have 7 x 9.9X 9.4 cm fluid collection.  General surgery and iron were consulted.  Patient was started on antibiotics, IR drain was placed on 7//20.   Assessment & Plan:   Principal Problem:   Abscess, intra-abdominal, postoperative Active Problems:   Hx of CABG   Coronary artery disease   Non-insulin dependent type 2 diabetes mellitus (HCC)   AKI (acute kidney injury) (HCC)   RBBB  Abdominal pain secondary to gallbladder fossa abscess Status post laparoscopic cholecystectomy -Status post IR drain placement on 11/01/18.  Follow-up CT abdomen performed today, shows some improvement in fluid collection in the gallbladder fossa.  Continues to have increased drainage but HIDA scan is negative for any leak. -Blood culture-negative -Wound culture is growing Klebsiella and enterococcus.  Transitioned patient from IV antibiotics to oral Bactrim and Augmentin twice daily.  As of right now last day point 11/12/2018 -Monitor with PO intake.   Melanotic stool, stable -Hemoglobin is stable.  He is on Eliquis.  PPI added.  We will monitor.  Colonic, ileus, continues to improve -Significantly improved with conservative management-ambulation and supportive care.  Dulcolax suppository.  Monitor electrolytes.  Acute on chronic anemia -Hemoglobin is around 8.0.  Stable.  Continue to monitor this.  CAD status post CABG X4 December 2019 A fib/Flutter, controlled. S/p Bypass -Does not appear to be in any chest pain.  Appears comfortable from cardiac standpoint. -On Eliquis  Diabetes mellitus type  2 -Insulin sliding scale Accu-Chek.  PT/OT evaluation- rec home HH  DVT prophylaxis: SCDs  Code Status: Full code Family Communication: Spoke with Corrie DandyMary his daughter.  Disposition Plan: Continue hospital stay until cleared by general surgery and drainage has decreased from his drain.  Consultants:   Surgery  Radiology  Procedures:   Drain placed 7/4  Antimicrobials:   Augmentin  Bactrim   Subjective: Overall feels better, ambulating well with use.  Review of Systems Otherwise negative except as per HPI, including: Constitutional: NAD, calm, comfortable Eyes: PERRL, lids and conjunctivae normal ENMT: Mucous membranes are moist. Posterior pharynx clear of any exudate or lesions.Normal dentition.  Neck: normal, supple, no masses, no thyromegaly Respiratory: clear to auscultation bilaterally, no wheezing, no crackles. Normal respiratory effort. No accessory muscle use.  Cardiovascular: Regular rate and rhythm, no murmurs / rubs / gallops. No extremity edema. 2+ pedal pulses. No carotid bruits.  Abdomen: no tenderness, no masses palpated. No hepatosplenomegaly. Bowel sounds positive.  Musculoskeletal: no clubbing / cyanosis. No joint deformity upper and lower extremities. Good ROM, no contractures. Normal muscle tone.  Skin: no rashes, lesions, ulcers. No induration Neurologic: CN 2-12 grossly intact. Sensation intact, DTR normal. Strength 5/5 in all 4.  Psychiatric: Normal judgment and insight. Alert and oriented x 3. Normal mood.     Objective: Vitals:   11/09/18 1646 11/09/18 1946 11/10/18 0457 11/10/18 0733  BP: 138/75 134/71 138/70 (!) 141/65  Pulse:  80 74   Resp: 15 (!) 23 18 16   Temp: 98.1 F (36.7 C) 98.1 F (36.7 C) 98.7 F (37.1 C) 98.2 F (36.8 C)  TempSrc: Oral Oral Oral Oral  SpO2:  98% 96% 94%  Weight:  Height:        Intake/Output Summary (Last 24 hours) at 11/10/2018 1106 Last data filed at 11/10/2018 0800 Gross per 24 hour  Intake 850 ml   Output 155 ml  Net 695 ml   Filed Weights   10/31/18 1720 11/01/18 0140  Weight: 105.7 kg 106.3 kg    Examination: Constitutional: NAD, calm, comfortable Eyes: PERRL, lids and conjunctivae normal ENMT: Mucous membranes are moist. Posterior pharynx clear of any exudate or lesions.Normal dentition.  Neck: normal, supple, no masses, no thyromegaly Respiratory: clear to auscultation bilaterally, no wheezing, no crackles. Normal respiratory effort. No accessory muscle use.  Cardiovascular: Regular rate and rhythm, no murmurs / rubs / gallops. No extremity edema. 2+ pedal pulses. No carotid bruits.  Abdomen: no tenderness, no masses palpated. No hepatosplenomegaly. Bowel sounds positive.  Right upper quadrant drain noted Musculoskeletal: no clubbing / cyanosis. No joint deformity upper and lower extremities. Good ROM, no contractures. Normal muscle tone.  Skin: no rashes, lesions, ulcers. No induration Neurologic: CN 2-12 grossly intact. Sensation intact, DTR normal. Strength 5/5 in all 4.  Psychiatric: Normal judgment and insight. Alert and oriented x 3. Normal mood.    Right upper quadrant drain noted with dark blood.  Data Reviewed:   CBC: Recent Labs  Lab 11/03/18 1459  11/06/18 0356 11/07/18 0312 11/08/18 0307 11/09/18 0330 11/10/18 0436  WBC 10.4   < > 10.3 10.2 6.6 7.6 9.3  NEUTROABS 8.9*  --   --   --   --   --   --   HGB 6.4*   < > 8.0* 8.2* 7.7* 8.4* 8.6*  HCT 20.6*   < > 24.5* 25.9* 24.2* 26.6* 27.8*  MCV 82.1   < > 84.5 84.1 84.3 85.0 85.0  PLT 196   < > 322 376 386 459* 481*   < > = values in this interval not displayed.   Basic Metabolic Panel: Recent Labs  Lab 11/06/18 0356 11/07/18 0312 11/08/18 0307 11/09/18 0330 11/10/18 0436  NA 131* 133* 135 135 134*  K 4.0 3.7 3.6 3.8 3.8  CL 97* 98 101 100 100  CO2 24 23 24 23 23   GLUCOSE 129* 126* 106* 120* 117*  BUN 7* 6* 10 10 11   CREATININE 1.17 1.09 1.24 1.29* 1.39*  CALCIUM 8.9 8.9 8.6* 8.9 8.8*  MG   --  1.6* 1.9 1.9 2.0   GFR: Estimated Creatinine Clearance: 56.1 mL/min (A) (by C-G formula based on SCr of 1.39 mg/dL (H)). Liver Function Tests: Recent Labs  Lab 11/04/18 0259 11/05/18 0421 11/06/18 0356  AST 19 18 18   ALT 18 15 16   ALKPHOS 69 64 59  BILITOT 0.6 0.7 0.6  PROT 5.4* 5.3* 5.6*  ALBUMIN 2.3* 2.2* 2.3*   Recent Labs  Lab 11/04/18 0259  LIPASE 19   No results for input(s): AMMONIA in the last 168 hours. Coagulation Profile: No results for input(s): INR, PROTIME in the last 168 hours. Cardiac Enzymes: No results for input(s): CKTOTAL, CKMB, CKMBINDEX, TROPONINI in the last 168 hours. BNP (last 3 results) No results for input(s): PROBNP in the last 8760 hours. HbA1C: No results for input(s): HGBA1C in the last 72 hours. CBG: Recent Labs  Lab 11/09/18 1222 11/09/18 1642 11/09/18 2159 11/09/18 2343 11/10/18 0623  GLUCAP 119* 105* 115* 113* 115*   Lipid Profile: No results for input(s): CHOL, HDL, LDLCALC, TRIG, CHOLHDL, LDLDIRECT in the last 72 hours. Thyroid Function Tests: No results for input(s): TSH, T4TOTAL, FREET4,  T3FREE, THYROIDAB in the last 72 hours. Anemia Panel: No results for input(s): VITAMINB12, FOLATE, FERRITIN, TIBC, IRON, RETICCTPCT in the last 72 hours. Sepsis Labs: No results for input(s): PROCALCITON, LATICACIDVEN in the last 168 hours.  Recent Results (from the past 240 hour(s))  Blood culture (routine x 2)     Status: None   Collection Time: 10/31/18  7:40 PM   Specimen: BLOOD  Result Value Ref Range Status   Specimen Description   Final    BLOOD LEFT HAND Performed at [facility], [address] [place], [place] [address]    Special Requests   Final    BOTTLES DRAWN AEROBIC ONLY Blood Culture results may not be optimal due to an inadequate volume of blood received in culture bottles Performed at [facility] [place], [address] [place], [place] [address]    Culture   Final    NO GROWTH 5 DAYS Performed  at [facility], [address] [place], [place] [address]    Report Status 11/05/2018 FINAL  Final  SARS Coronavirus 2 (Hosp order,Performed in [facility] lab via [employer] ID)     Status: None   Collection Time: 10/31/18  8:52 PM   Specimen: Dry Nasal Swab (Abbott ID Now)  Result Value Ref Range Status   SARS Coronavirus 2 (Abbott ID Now) NEGATIVE NEGATIVE Final    Comment: (NOTE) SARS-CoV-2 target nucleic acids are NOT DETECTED. The SARS-CoV-2 RNA is generally detectable in upper and lower respiratory specimens during the acute phase of infection.  Negativeresults do not preclude SARS-CoV-2 infection, do not rule out coinfections with other pathogens, and should not be used as the  sole basis for treatment or other patient management decisions.  Negative results must be combined with clinical observations, patient history, and epidemiological information. The expected result is Negative. Fact Sheet for Patients: http://www.graves-ford.org/https://www.fda.gov/media/136524/download Fact Sheet for Healthcare Providers: EnviroConcern.sihttps://www.fda.gov/media/136523/download This test is not yet approved or cleared by the [place] FDA and  has been authorized for detection and/or diagnosis of SARS-CoV-2 by FDA under an Emergency Use Authorization (EUA).  This EUA will remain in effect (meaning this test can be used) for the duration of  the COVID19 declaration under Section 5 64(b)(1) of the Act, 21 U.S.C.  section 916 073 [id number] 3(b)(1), unless the authorization is terminated or revoked sooner. Performed at [facility] [place], [address] [place], [place] [address]   Aerobic/Anaerobic Culture (surgical/deep wound)     Status: None   Collection Time: 11/01/18 11:33 AM   Specimen: Abdomen; Abscess  Result Value Ref Range Status   Specimen Description ABDOMEN  Final   Special Requests Normal  Final   Gram Stain   Final    MODERATE WBC PRESENT, PREDOMINANTLY PMN MODERATE GRAM NEGATIVE RODS FEW GRAM POSITIVE  COCCI IN PAIRS    Culture   Final    ABUNDANT KLEBSIELLA PNEUMONIAE ABUNDANT ENTEROCOCCUS FAECALIS ABUNDANT PREVOTELLA DENTICOLA BETA LACTAMASE POSITIVE Performed at [facility], [address] [place], [place] [address]    Report Status 11/06/2018 FINAL  Final   Organism ID, Bacteria ENTEROCOCCUS FAECALIS  Final   Organism ID, Bacteria KLEBSIELLA PNEUMONIAE  Final      Susceptibility   Enterococcus faecalis - MIC*    AMPICILLIN <=2 SENSITIVE Sensitive     VANCOMYCIN 1 SENSITIVE Sensitive     GENTAMICIN SYNERGY SENSITIVE Sensitive     * ABUNDANT ENTEROCOCCUS FAECALIS   Klebsiella pneumoniae - MIC*  AMPICILLIN >=32 RESISTANT Resistant     CEFAZOLIN >=64 RESISTANT Resistant     CEFEPIME <=1 SENSITIVE Sensitive     CEFTAZIDIME 4 SENSITIVE Sensitive     CEFTRIAXONE 4 SENSITIVE Sensitive     CIPROFLOXACIN <=0.25 SENSITIVE Sensitive     GENTAMICIN <=1 SENSITIVE Sensitive     IMIPENEM <=0.25 SENSITIVE Sensitive     TRIMETH/SULFA <=20 SENSITIVE Sensitive     AMPICILLIN/SULBACTAM >=32 RESISTANT Resistant     PIP/TAZO 8 SENSITIVE Sensitive     Extended ESBL NEGATIVE Sensitive     * ABUNDANT KLEBSIELLA PNEUMONIAE         Radiology Studies: Nm Hepato Biliary Leak  Result Date: 11/08/2018 CLINICAL DATA:  One month status post cholecystectomy. Abdominal pain and bloating. Evaluate for bile leak. EXAM: NUCLEAR MEDICINE HEPATOBILIARY IMAGING TECHNIQUE: Sequential images of the abdomen were obtained out to 60 minutes following intravenous administration of radiopharmaceutical. RADIOPHARMACEUTICALS:  5.2 mCi Tc-54m  Choletec IV COMPARISON:  None. FINDINGS: Prompt uptake and biliary excretion of activity by the liver is seen. No gallbladder activity is seen, consistent with prior cholecystectomy. Biliary activity passes into small bowel, consistent with patent common bile duct. No extravasation of radiopharmaceutical activity is seen to suggest the presence of bile leak. IMPRESSION:  Status post cholecystectomy. No evidence of postoperative bile leak or biliary obstruction. Electronically Signed   By: Marlaine Hind M.D.   On: 11/08/2018 14:52        Scheduled Meds: . amLODipine  5 mg Oral Daily  . amoxicillin-clavulanate  1 tablet Oral Q12H  . apixaban  5 mg Oral BID  . aspirin EC  81 mg Oral Daily  . bisacodyl  10 mg Rectal Daily  . docusate sodium  100 mg Oral BID  . finasteride  5 mg Oral Daily  . insulin aspart  0-9 Units Subcutaneous TID WC  . metoprolol tartrate  25 mg Oral BID  . pantoprazole  40 mg Oral BID  . polyethylene glycol  17 g Oral Daily  . sodium chloride flush  5 mL Intracatheter Q8H  . sulfamethoxazole-trimethoprim  1 tablet Oral Q12H   Continuous Infusions:    LOS: 10 days   Time spent= 25 mins    Ankit Arsenio Loader, MD [facility]  If 7PM-7AM, please contact night-coverage [email or web address] 11/10/2018, 11:06 AM

## 2018-11-11 LAB — MAGNESIUM: Magnesium: 2.1 mg/dL (ref 1.7–2.4)

## 2018-11-11 LAB — CBC
HCT: 26.4 % — ABNORMAL LOW (ref 39.0–52.0)
Hemoglobin: 8.3 g/dL — ABNORMAL LOW (ref 13.0–17.0)
MCH: 26.8 pg (ref 26.0–34.0)
MCHC: 31.4 g/dL (ref 30.0–36.0)
MCV: 85.2 fL (ref 80.0–100.0)
Platelets: 383 K/uL (ref 150–400)
RBC: 3.1 MIL/uL — ABNORMAL LOW (ref 4.22–5.81)
RDW: 17.9 % — ABNORMAL HIGH (ref 11.5–15.5)
WBC: 7.8 K/uL (ref 4.0–10.5)
nRBC: 0 % (ref 0.0–0.2)

## 2018-11-11 LAB — BASIC METABOLIC PANEL
Anion gap: 7 (ref 5–15)
BUN: 9 mg/dL (ref 8–23)
CO2: 24 mmol/L (ref 22–32)
Calcium: 8.9 mg/dL (ref 8.9–10.3)
Chloride: 104 mmol/L (ref 98–111)
Creatinine, Ser: 1.38 mg/dL — ABNORMAL HIGH (ref 0.61–1.24)
GFR calc Af Amer: 57 mL/min — ABNORMAL LOW (ref >60)
GFR calc non Af Amer: 49 mL/min — ABNORMAL LOW (ref >60)
Glucose, Bld: 97 mg/dL (ref 70–99)
Potassium: 3.9 mmol/L (ref 3.5–5.1)
Sodium: 135 mmol/L (ref 135–145)

## 2018-11-11 LAB — GLUCOSE, CAPILLARY
Glucose-Capillary: 91 mg/dL (ref 70–99)
Glucose-Capillary: 116 mg/dL — ABNORMAL HIGH (ref 70–99)
Glucose-Capillary: 96 mg/dL (ref 70–99)
Glucose-Capillary: 126 mg/dL — ABNORMAL HIGH (ref 70–99)

## 2018-11-11 NOTE — Progress Notes (Signed)
Physical Therapy Treatment Patient Details Name: [name] MRN: [id number] DOB: 1943-04-18 Today's Date: 11/11/2018    History of Present Illness [name] is a 73 y.o. male with medical history significant of CAD status post CABG x4, type 2 diabetes, hypertension, hyperlipidemia, diverticulitis, liver abscess, cholecystitis status post percutaneous cholecystostomy tube 1/12 and eventually lap chole on 6/9 presented [facility] with complaints of abdominal pain..  S/P drain placement for old hematoma.    PT Comments    Pt agreeable to walking with therapy and happy to report that he has challenged himself to ambulate 7x/day to build endurance. Pt either ambulates 450 feet or 250 feet with stair stepping in stairwell. Pt ambulated to stairwell and was able to step up 20x before fatiguing and returning to room. Pt is very excited to d/c home tomorrow with daughter staying with him the first couple days.     Follow Up Recommendations  Home health PT;Other (comment)(working towards CRP2)     Equipment Recommendations  Rolling walker with 5" wheels       Precautions / Restrictions Precautions Precautions: Fall Restrictions Weight Bearing Restrictions: No    Mobility  Bed Mobility Overal bed mobility: Modified Independent                Transfers Overall transfer level: Needs assistance Equipment used: Rolling walker (2 wheeled) Transfers: Sit to/from Stand Sit to Stand: Supervision         General transfer comment: no assist required  Ambulation/Gait Ambulation/Gait assistance: Min guard Gait Distance (Feet): 250 Feet Assistive device: Rolling walker (2 wheeled) Gait Pattern/deviations: Step-through pattern;Decreased stride length;Trunk flexed Gait velocity: decreased Gait velocity interpretation: >2.62 ft/sec, indicative of community ambulatory General Gait Details: min guard for steady gait with RW   Stairs Stairs: Yes Stairs  assistance: Supervision Stair Management: Two rails;Alternating pattern;Backwards;Forwards   General stair comments: at foot of stairs goes up 1 step x 20 alternating push off LE           Balance Overall balance assessment: Needs assistance Sitting-balance support: No upper extremity supported;Feet supported Sitting balance-Leahy Scale: Good     Standing balance support: No upper extremity supported;During functional activity Standing balance-Leahy Scale: Good                              Cognition Arousal/Alertness: Awake/alert Behavior During Therapy: WFL for tasks assessed/performed Overall Cognitive Status: Within Functional Limits for tasks assessed                                           General Comments General comments (skin integrity, edema, etc.): max HR with ambulation 118bpm, quickly return to 60's with sitting in recliner      Pertinent Vitals/Pain Pain Assessment: Faces Faces Pain Scale: Hurts little more Pain Location: generalized discomfort Pain Descriptors / Indicators: Discomfort Pain Intervention(s): Limited activity within patient's tolerance;Monitored during session;Repositioned           PT Goals (current goals can now be found in the care plan section) Acute Rehab PT Goals Patient Stated Goal: back to playing golf. PT Goal Formulation: With patient Time For Goal Achievement: 11/18/18 Potential to Achieve Goals: Good Progress towards PT goals: Progressing toward goals    Frequency    Min 3X/week      PT  Plan Current plan remains appropriate       AM-PAC PT "6 Clicks" Mobility   Outcome Measure  Help needed turning from your back to your side while in a flat bed without using bedrails?: None Help needed moving from lying on your back to sitting on the side of a flat bed without using bedrails?: None Help needed moving to and from a bed to a chair (including a wheelchair)?: A Little Help needed  standing up from a chair using your arms (e.g., wheelchair or bedside chair)?: A Little Help needed to walk in hospital room?: A Little Help needed climbing 3-5 steps with a railing? : A Lot 6 Click Score: 19    End of Session Equipment Utilized During Treatment: Gait belt Activity Tolerance: Patient tolerated treatment well;Patient limited by fatigue Patient left: with call bell/phone within reach;in chair Nurse Communication: Mobility status PT Visit Diagnosis: History of falling (Z91.81);Other abnormalities of gait and mobility (R26.89)     Time: 7588-3254 PT Time Calculation (min) (ACUTE ONLY): 25 min  Charges:  $Gait Training: 23-37 mins                     Leverne Amrhein B. Migdalia Dk PT, DPT Acute Rehabilitation Services Pager [phone] Office [phone]    [place] 11/11/2018, 4:18 PM

## 2018-11-11 NOTE — Progress Notes (Signed)
  Progress Note: General Surgery Service   Assessment/Plan: Principal Problem:   Abscess, intra-abdominal, postoperative Active Problems:   Hx of CABG   Coronary artery disease   Non-insulin dependent type 2 diabetes mellitus (HCC)   AKI (acute kidney injury) ([place])   RBBB  Gallbladder fossa abscess, admission 07/04, S/P lap chole 10/07/18 -S/p IR drain  - cultures showedKlebsiella and enterococcus-transitioned to PO abx 7/9 - CT 7/10 shows fluid collection gallbladder fossa decreased in size, drain in place - this could have been an infected bilioma/hematoma, we will monitor output whichhas been high the last 2 days. - HIDA neg for bile leak  Colonic ileus - AXR07/11 showeddilitation is improved - encourage ambulation/OOB. Add daily dulcolax suppository  HUT:MLYYT healthy/carb mod, protonix VTE: SCD's,Eliquis KP:TWSFK 07/04>>7/9, augmentin/bactrim 7/9>> Follow up:Dr. Barry Dienes, ok to discharge from surgery standpoint   LOS: 11 days  Chief Complaint/Subjective: Pain controlled, tolerating diet, almost feels ready to go home  Objective: Vital signs in last 24 hours: Temp:  [98.3 F (36.8 C)-98.9 F (37.2 C)] 98.3 F (36.8 C) (07/14 0725) Pulse Rate:  [63] 63 (07/13 1237) Resp:  [15-19] 16 (07/14 0725) BP: (123-152)/(64-79) 152/79 (07/14 0725) SpO2:  [96 %-98 %] 98 % (07/14 0725) Last BM Date: 11/11/18  Intake/Output from previous day: 07/13 0701 - 07/14 0700 In: 248 [P.O.:240; I.V.:3] Out: 110 [Drains:110] Intake/Output this shift: No intake/output data recorded.  Gen: NAD  Lungs: nonlabored  Cardiovascular: RRR  Abd: soft, ATTP, drain with clear green drainage  Neuro: AOx4   Mickeal Skinner, MD [facility]

## 2018-11-11 NOTE — Progress Notes (Signed)
PROGRESS NOTE    ORAL REMACHE  RWE:[id number] DOB: 09-Nov-1942 DOA: 10/31/2018 PCP: Guadlupe Spanish, MD   Brief Narrative:  73 year old with a history of CAD status post CABG, diabetes mellitus type 2, essential hypertension, hyperlipidemia, liver abscess, diverticulitis, cholecystitis status post laparoscopic cholecystectomy came to the hospital with complains of abdominal pain.  Eventually was found to have 7 x 9.9X 9.4 cm fluid collection.  General surgery and iron were consulted.  Patient was started on antibiotics, IR drain was placed on 11/01/18.  Over the weekend drainage increased but has slowed down over the last 24 hours.General surgery cleared for patient to go home, awaiting IR to clear the patient.  If so we will discharge him tomorrow.  His daughter has been updated.   Assessment & Plan:   Principal Problem:   Abscess, intra-abdominal, postoperative Active Problems:   Hx of CABG   Coronary artery disease   Non-insulin dependent type 2 diabetes mellitus (HCC)   AKI (acute kidney injury) (HCC)   RBBB  Abdominal pain secondary to gallbladder fossa abscess Status post laparoscopic cholecystectomy -Status post IR drain placement on 11/01/18.  Follow-up CT abdomen performed today, shows some improvement in fluid collection in the gallbladder fossa.  Continues to have increased drainage but HIDA scan is negative for any leak. -Blood culture-negative -Wound culture is growing Klebsiella and enterococcus.  Transitioned patient from IV antibiotics to oral Bactrim and Augmentin twice daily.  Last day would be 11/12/2018. -Monitor with PO intake.   Melanotic stool, stable -This is resolved.  He is on Eliquis.  PPI.  Colonic, ileus, continues to improve -Appears to have resolved.  Advised to continue ambulation in the hallway.  Dulcolax suppository as needed..  Acute on chronic anemia -Hemoglobin is around 8.0.  Stable.  Continue to monitor this.  CAD status post CABG X4 December  2019 A fib/Flutter, controlled. S/p Bypass -Does not appear to be in any chest pain.  Appears comfortable from cardiac standpoint. -On Eliquis  Diabetes mellitus type 2 -Insulin sliding scale Accu-Chek.  PT/OT evaluation- rec home HH  DVT prophylaxis: SCDs  Code Status: Full code Family Communication: Spoke with the patient's daughter Stanton Kidney, I have updated her to anticipate his discharge tomorrow. Disposition Plan: Discharge tomorrow.  Consultants:   Surgery  Radiology  Procedures:   Drain placed 7/4  Antimicrobials:   Augmentin  Bactrim   Subjective: Overall feels well.  Ambulating in the hallway without any issues.  Had a bowel movement this morning, tells me this is the first normal bowel movement he has had in the month.  Feels like he has made good progress towards improvement but still has some lack of support at home.  His daughter can be at his house tomorrow for further assistance.  Review of Systems Otherwise negative except as per HPI, including: General = no fevers, chills, dizziness, malaise, fatigue HEENT/EYES = negative for pain, redness, loss of vision, double vision, blurred vision, loss of hearing, sore throat, hoarseness, dysphagia Cardiovascular= negative for chest pain, palpitation, murmurs, lower extremity swelling Respiratory/lungs= negative for shortness of breath, cough, hemoptysis, wheezing, mucus production Gastrointestinal= negative for nausea, vomiting,, abdominal pain, melena, hematemesis Genitourinary= negative for Dysuria, Hematuria, Change in Urinary Frequency MSK = Negative for arthralgia, myalgias, Back Pain, Joint swelling  Neurology= Negative for headache, seizures, numbness, tingling  Psychiatry= Negative for anxiety, depression, suicidal and homocidal ideation Allergy/Immunology= Medication/Food allergy as listed  Skin= Negative for Rash, lesions, ulcers, itching    Objective: Vitals:  11/10/18 1237 11/10/18 1952 11/11/18  0430 11/11/18 0725  BP: 123/64 134/70 140/69 (!) 152/79  Pulse: 63     Resp: 18 19 15 16   Temp: 98.9 F (37.2 C) 98.3 F (36.8 C) 98.8 F (37.1 C) 98.3 F (36.8 C)  TempSrc: Oral Oral Oral Oral  SpO2: 96% 96% 96% 98%  Weight:      Height:        Intake/Output Summary (Last 24 hours) at 11/11/2018 1037 Last data filed at 11/11/2018 0828 Gross per 24 hour  Intake 368 ml  Output 60 ml  Net 308 ml   Filed Weights   10/31/18 1720 11/01/18 0140  Weight: 105.7 kg 106.3 kg    Examination: Constitutional: NAD, calm, comfortable Eyes: PERRL, lids and conjunctivae normal ENMT: Mucous membranes are moist. Posterior pharynx clear of any exudate or lesions.Normal dentition.  Neck: normal, supple, no masses, no thyromegaly Respiratory: clear to auscultation bilaterally, no wheezing, no crackles. Normal respiratory effort. No accessory muscle use.  Cardiovascular: Regular rate and rhythm, no murmurs / rubs / gallops. No extremity edema. 2+ pedal pulses. No carotid bruits.  Abdomen: no tenderness, no masses palpated. No hepatosplenomegaly. Bowel sounds positive.  Musculoskeletal: no clubbing / cyanosis. No joint deformity upper and lower extremities. Good ROM, no contractures. Normal muscle tone.  Skin: no rashes, lesions, ulcers. No induration Neurologic: CN 2-12 grossly intact. Sensation intact, DTR normal. Strength 5/5 in all 4.  Psychiatric: Normal judgment and insight. Alert and oriented x 3. Normal mood.    Right upper quadrant drain noted with dark blood.  Data Reviewed:   CBC: Recent Labs  Lab 11/07/18 0312 11/08/18 0307 11/09/18 0330 11/10/18 0436 11/11/18 0703  WBC 10.2 6.6 7.6 9.3 7.8  HGB 8.2* 7.7* 8.4* 8.6* 8.3*  HCT 25.9* 24.2* 26.6* 27.8* 26.4*  MCV 84.1 84.3 85.0 85.0 85.2  PLT 376 386 459* 481* 383   Basic Metabolic Panel: Recent Labs  Lab 11/07/18 0312 11/08/18 0307 11/09/18 0330 11/10/18 0436 11/11/18 0703  NA 133* 135 135 134* 135  K 3.7 3.6 3.8  3.8 3.9  CL 98 101 100 100 104  CO2 23 24 23 23 24   GLUCOSE 126* 106* 120* 117* 97  BUN 6* 10 10 11 9   CREATININE 1.09 1.24 1.29* 1.39* 1.38*  CALCIUM 8.9 8.6* 8.9 8.8* 8.9  MG 1.6* 1.9 1.9 2.0 2.1   GFR: Estimated Creatinine Clearance: 56.5 mL/min (A) (by C-G formula based on SCr of 1.38 mg/dL (H)). Liver Function Tests: Recent Labs  Lab 11/05/18 0421 11/06/18 0356  AST 18 18  ALT 15 16  ALKPHOS 64 59  BILITOT 0.7 0.6  PROT 5.3* 5.6*  ALBUMIN 2.2* 2.3*   No results for input(s): LIPASE, AMYLASE in the last 168 hours. No results for input(s): AMMONIA in the last 168 hours. Coagulation Profile: No results for input(s): INR, PROTIME in the last 168 hours. Cardiac Enzymes: No results for input(s): CKTOTAL, CKMB, CKMBINDEX, TROPONINI in the last 168 hours. BNP (last 3 results) No results for input(s): PROBNP in the last 8760 hours. HbA1C: No results for input(s): HGBA1C in the last 72 hours. CBG: Recent Labs  Lab 11/10/18 0623 11/10/18 1137 11/10/18 1741 11/10/18 2118 11/11/18 0624  GLUCAP 115* 96 131* 101* 91   Lipid Profile: No results for input(s): CHOL, HDL, LDLCALC, TRIG, CHOLHDL, LDLDIRECT in the last 72 hours. Thyroid Function Tests: No results for input(s): TSH, T4TOTAL, FREET4, T3FREE, THYROIDAB in the last 72 hours. Anemia Panel: No results  for input(s): VITAMINB12, FOLATE, FERRITIN, TIBC, IRON, RETICCTPCT in the last 72 hours. Sepsis Labs: No results for input(s): PROCALCITON, LATICACIDVEN in the last 168 hours.  Recent Results (from the past 240 hour(s))  Aerobic/Anaerobic Culture (surgical/deep wound)     Status: None   Collection Time: 11/01/18 11:33 AM   Specimen: Abdomen; Abscess  Result Value Ref Range Status   Specimen Description ABDOMEN  Final   Special Requests Normal  Final   Gram Stain   Final    MODERATE WBC PRESENT, PREDOMINANTLY PMN MODERATE GRAM NEGATIVE RODS FEW GRAM POSITIVE COCCI IN PAIRS    Culture   Final    ABUNDANT  KLEBSIELLA PNEUMONIAE ABUNDANT ENTEROCOCCUS FAECALIS ABUNDANT PREVOTELLA DENTICOLA BETA LACTAMASE POSITIVE Performed at [facility], [address] [place], [place] [address]    Report Status 11/06/2018 FINAL  Final   Organism ID, Bacteria ENTEROCOCCUS FAECALIS  Final   Organism ID, Bacteria KLEBSIELLA PNEUMONIAE  Final      Susceptibility   Enterococcus faecalis - MIC*    AMPICILLIN <=2 SENSITIVE Sensitive     VANCOMYCIN 1 SENSITIVE Sensitive     GENTAMICIN SYNERGY SENSITIVE Sensitive     * ABUNDANT ENTEROCOCCUS FAECALIS   Klebsiella pneumoniae - MIC*    AMPICILLIN >=32 RESISTANT Resistant     CEFAZOLIN >=64 RESISTANT Resistant     CEFEPIME <=1 SENSITIVE Sensitive     CEFTAZIDIME 4 SENSITIVE Sensitive     CEFTRIAXONE 4 SENSITIVE Sensitive     CIPROFLOXACIN <=0.25 SENSITIVE Sensitive     GENTAMICIN <=1 SENSITIVE Sensitive     IMIPENEM <=0.25 SENSITIVE Sensitive     TRIMETH/SULFA <=20 SENSITIVE Sensitive     AMPICILLIN/SULBACTAM >=32 RESISTANT Resistant     PIP/TAZO 8 SENSITIVE Sensitive     Extended ESBL NEGATIVE Sensitive     * ABUNDANT KLEBSIELLA PNEUMONIAE         Radiology Studies: No results found.      Scheduled Meds: . amLODipine  5 mg Oral Daily  . amoxicillin-clavulanate  1 tablet Oral Q12H  . apixaban  5 mg Oral BID  . aspirin EC  81 mg Oral Daily  . bisacodyl  10 mg Rectal Daily  . docusate sodium  100 mg Oral BID  . finasteride  5 mg Oral Daily  . insulin aspart  0-9 Units Subcutaneous TID WC  . metoprolol tartrate  25 mg Oral BID  . pantoprazole  40 mg Oral BID  . polyethylene glycol  17 g Oral Daily  . sodium chloride flush  5 mL Intracatheter Q8H  . sulfamethoxazole-trimethoprim  1 tablet Oral Q12H   Continuous Infusions:    LOS: 11 days   Time spent= 25 mins    Ankit Joline Maxcyhirag Amin, MD [facility]  If 7PM-7AM, please contact night-coverage [email or web address] 11/11/2018, 10:37 AM

## 2018-11-11 NOTE — Progress Notes (Signed)
Referring Physician(s): Dr. Donell BeersByerly  Supervising Physician: Oley BalmHassell, Daniel  Patient Status:  [facility] - In[facility]  Chief Complaint: Merri RayGall bladder fossa abscess  Subjective: Lying in bed.  Asleep.  "I just feel weak."  Allergies: Ciprofloxacin and Erythromycin ethylsuccinate  Medications: Prior to Admission medications   Medication Sig Start Date End Date Taking? Authorizing Provider  acetaminophen (TYLENOL) 500 MG tablet Take 1,000 mg by mouth daily as needed for moderate pain.   Yes [provider]  amiodarone (PACERONE) 200 MG tablet TAKE 1 TABLET BY MOUTH DAILY Patient taking differently: Take 200 mg by mouth daily.  07/04/18  Yes Runell GessBerry, Jonathan J, MD  amLODipine (NORVASC) 5 MG tablet Take 5 mg by mouth daily. 02/05/18  Yes [provider]  aspirin EC 81 MG tablet Take 1 tablet (81 mg total) by mouth daily. Patient taking differently: Take 81 mg by mouth every evening.  05/01/18  Yes Kilroy, Eda PaschalLuke K, PA-C  Cholecalciferol (VITAMIN D3) 2000 units TABS Take 2,000 Units by mouth 2 (two) times daily.    Yes [provider]  docusate sodium (COLACE) 100 MG capsule Take 100 mg by mouth 2 (two) times daily.    Yes [provider]  ELIQUIS 5 MG TABS tablet TAKE ONE TABLET BY MOUTH TWICE DAILY 06/03/18  Yes Runell GessBerry, Jonathan J, MD  finasteride (PROSCAR) 5 MG tablet Take 5 mg by mouth daily. 02/05/18  Yes [provider]  loratadine (CLARITIN) 10 MG tablet Take 10 mg by mouth daily.   Yes [provider]  metFORMIN (GLUCOPHAGE) 1000 MG tablet Take 1,000 mg by mouth 2 (two) times daily. 02/05/18  Yes [provider]  metoprolol tartrate (LOPRESSOR) 25 MG tablet Take 1 tablet (25 mg total) by mouth 2 (two) times daily. 05/20/18  Yes Runell GessBerry, Jonathan J, MD  Multiple Vitamins-Minerals (MULTIVITAMIN ADULT PO) Take 1 tablet by mouth daily.   Yes [provider]  oxyCODONE (OXY IR/ROXICODONE) 5 MG immediate release tablet Take 1 tablet (5 mg  total) by mouth every 6 (six) hours as needed for severe pain. Patient not taking: Reported on 11/01/2018 10/07/18   Almond LintByerly, Faera, MD     Vital Signs: BP (!) 152/79 (BP Location: Left Arm)    Pulse 63    Temp 98.3 F (36.8 C) (Oral)    Resp 16    Ht 5\' 11"  (1.803 m)    Wt 234 lb 4.8 oz (106.3 kg)    SpO2 98%    BMI 32.68 kg/m   Physical Exam  NAD, alert Abdomen:  Soft.  Drain in place in RUQ. Yellow, bilious fluid in bulb,  Flushes easily.   Imaging: Dg Abd 1 View  Result Date: 11/08/2018 CLINICAL DATA:  Abdominal distention. Pt states lower abdominal pain x few days with runny stool. Hx of liver 3 abscesses, last one in June. Hx of diabetes, HTN, CAD. EXAM: ABDOMEN - 1 VIEW COMPARISON:  CT, 11/07/2018.  Abdominal radiographs, 11/06/2018. FINDINGS: No bowel dilation to suggest obstruction. There is less gas in the colon than on the prior radiographs, with no current significant colonic distention. Pigtail catheter in the right upper quadrant is stable. IMPRESSION: 1. No evidence of bowel obstruction. Decreased gaseous distention of the colon when compared to the prior radiographs from 11/06/2018. 2. Stable right upper quadrant pigtail catheter. Electronically Signed   By: Amie Portlandavid  Ormond M.D.   On: 11/08/2018 09:49   Nm Hepato Biliary Leak  Result Date: 11/08/2018 CLINICAL DATA:  One month status  post cholecystectomy. Abdominal pain and bloating. Evaluate for bile leak. EXAM: NUCLEAR MEDICINE HEPATOBILIARY IMAGING TECHNIQUE: Sequential images of the abdomen were obtained out to 60 minutes following intravenous administration of radiopharmaceutical. RADIOPHARMACEUTICALS:  5.2 mCi Tc-59m  Choletec IV COMPARISON:  None. FINDINGS: Prompt uptake and biliary excretion of activity by the liver is seen. No gallbladder activity is seen, consistent with prior cholecystectomy. Biliary activity passes into small bowel, consistent with patent common bile duct. No extravasation of radiopharmaceutical activity is  seen to suggest the presence of bile leak. IMPRESSION: Status post cholecystectomy. No evidence of postoperative bile leak or biliary obstruction. Electronically Signed   By: Marlaine Hind M.D.   On: 11/08/2018 14:52    Labs:  CBC: Recent Labs    11/08/18 0307 11/09/18 0330 11/10/18 0436 11/11/18 0703  WBC 6.6 7.6 9.3 7.8  HGB 7.7* 8.4* 8.6* 8.3*  HCT 24.2* 26.6* 27.8* 26.4*  PLT 386 459* 481* 383    COAGS: Recent Labs    03/14/18 1610 03/26/18 1146 03/31/18 1458 04/08/18 0238 05/10/18 0907 10/06/18 0903  INR 0.96 0.96 1.37 1.06 1.42 0.9  APTT 32 31 29  --   --   --     BMP: Recent Labs    11/08/18 0307 11/09/18 0330 11/10/18 0436 11/11/18 0703  NA 135 135 134* 135  K 3.6 3.8 3.8 3.9  CL 101 100 100 104  CO2 24 23 23 24   GLUCOSE 106* 120* 117* 97  BUN 10 10 11 9   CALCIUM 8.6* 8.9 8.8* 8.9  CREATININE 1.24 1.29* 1.39* 1.38*  GFRNONAA 56* 53* 49* 49*  GFRAA >60 >60 57* 57*    LIVER FUNCTION TESTS: Recent Labs    11/03/18 0323 11/04/18 0259 11/05/18 0421 11/06/18 0356  BILITOT 0.6 0.6 0.7 0.6  AST 22 19 18 18   ALT 22 18 15 16   ALKPHOS 64 69 64 59  PROT 5.3* 5.4* 5.3* 5.6*  ALBUMIN 2.3* 2.3* 2.2* 2.3*    Assessment and Plan: Gall bladder fossa abscess s/p drain placement 7/4 Afebrile WBC 7.8 Increased drainage observed today-- possible liquefaction of hematoma vs. Infected biloma.  Continue routine flushes.  IR following.   Electronically Signed: Docia Barrier, PA 11/11/2018, 9:47 AM   I spent a total of 15 Minutes at the the patient's bedside AND on the patient's hospital floor or unit, greater than 50% of which was counseling/coordinating care for gall bladder fossa abscess.

## 2018-11-11 NOTE — Care Management Important Message (Signed)
Important Message  Patient Details  Name: [name] MRN: [id number] Date of Birth: [birth year]   Medicare Important Message Given:  Yes     Shelda Altes 11/11/2018, 2:05 PM

## 2018-11-12 LAB — BASIC METABOLIC PANEL
Anion gap: 11 (ref 5–15)
BUN: 11 mg/dL (ref 8–23)
CO2: 22 mmol/L (ref 22–32)
Calcium: 9 mg/dL (ref 8.9–10.3)
Chloride: 101 mmol/L (ref 98–111)
Creatinine, Ser: 1.42 mg/dL — ABNORMAL HIGH (ref 0.61–1.24)
GFR calc Af Amer: 55 mL/min — ABNORMAL LOW (ref >60)
GFR calc non Af Amer: 48 mL/min — ABNORMAL LOW (ref >60)
Glucose, Bld: 101 mg/dL — ABNORMAL HIGH (ref 70–99)
Potassium: 4 mmol/L (ref 3.5–5.1)
Sodium: 134 mmol/L — ABNORMAL LOW (ref 135–145)

## 2018-11-12 LAB — CBC
HCT: 26.7 % — ABNORMAL LOW (ref 39.0–52.0)
Hemoglobin: 8.4 g/dL — ABNORMAL LOW (ref 13.0–17.0)
MCH: 26.7 pg (ref 26.0–34.0)
MCHC: 31.5 g/dL (ref 30.0–36.0)
MCV: 84.8 fL (ref 80.0–100.0)
Platelets: 376 K/uL (ref 150–400)
RBC: 3.15 MIL/uL — ABNORMAL LOW (ref 4.22–5.81)
RDW: 18 % — ABNORMAL HIGH (ref 11.5–15.5)
WBC: 7.5 K/uL (ref 4.0–10.5)
nRBC: 0 % (ref 0.0–0.2)

## 2018-11-12 LAB — MAGNESIUM: Magnesium: 2 mg/dL (ref 1.7–2.4)

## 2018-11-12 LAB — GLUCOSE, CAPILLARY
Glucose-Capillary: 97 mg/dL (ref 70–99)
Glucose-Capillary: 139 mg/dL — ABNORMAL HIGH (ref 70–99)

## 2018-11-12 NOTE — Progress Notes (Signed)
Patient in a stable condition, discharge education reviewed with patient he verbalized understanding, Patient's daughter Stanton Kidney at bedside for education on dressing change, dressing change done as ordered , drain flushed, signs and symptoms  of infection reviewed with them they both verbalised understanding of the process. Dressing change supply and flushes given to the patient patient belongings at bedside, patient to be transported home by his daughter

## 2018-11-12 NOTE — Plan of Care (Signed)
  Problem: Elimination: Goal: Will not experience complications related to bowel motility Outcome: Completed/Met Goal: Will not experience complications related to urinary retention Outcome: Completed/Met   Problem: Pain Managment: Goal: General experience of comfort will improve Outcome: Completed/Met   Problem: Safety: Goal: Ability to remain free from injury will improve Outcome: Completed/Met   Problem: Skin Integrity: Goal: Risk for impaired skin integrity will decrease Outcome: Completed/Met   Problem: Education: Goal: Knowledge of General Education information will improve Description: Including pain rating scale, medication(s)/side effects and non-pharmacologic comfort measures Outcome: Completed/Met

## 2018-11-12 NOTE — TOC Transition Note (Signed)
Transition of Care [facility]) - CM/SW Discharge Note Marvetta Gibbons RN, BSN Transitions of Care Unit 4E- RN Case Manager [phone]   Patient Details  Name: [name] MRN: [id number] Date of Birth: 07-01-1942  [facility] Care [facility]) CM/SW Contact:  Dawayne Patricia, RN Phone Number: 11/12/2018, 11:33 AM   Clinical Narrative:    Pt stable for transition home today, orders placed for HHRN/PT/OT- referral has been sent per previous CM to [facility]- notification sent to [facility] with [facility] of pt's transition to home today.    Final next level of care: [place] Barriers to Discharge: No Barriers Identified   Patient Goals and CMS Choice Patient states their goals for this hospitalization and ongoing recovery are:: to go home CMS Medicare.gov Compare Post Acute Care list provided to:: Patient Choice offered to / list presented to : Patient  Discharge Placement  Home with [facility]                     Discharge Plan and Services   Discharge Planning Services: CM Consult Post Acute Care Choice: Home Health          DME Arranged: N/A         HH Arranged: PT, OT, RN, Nurse's Aide [place] Agency: [place] ([facility]) Date HH Agency Contacted: 11/12/18 Time [place]: 34 Representative spoke with at [place]: Janae Sauce  Social Determinants of Health ([place]) Interventions     Readmission Risk Interventions Readmission Risk Prevention Plan 11/12/2018  Transportation Screening Complete  PCP or Specialist Appt within 3-5 Days Complete  HRI or [place] Complete  Social Work Consult for [place] Planning/Counseling Complete  Palliative Care Screening Not Applicable  Medication Review Press photographer) Complete  Some recent data might be hidden

## 2018-11-12 NOTE — Discharge Summary (Signed)
Discharge Summary  [name] ZOX:[id number] DOB: Apr 13, 1943  PCP: Karle PlumberArvind, Moogali M, MD  Admit date: 10/31/2018 Discharge date: 11/12/2018  Time spent: 35 minutes  Recommendations for Outpatient Follow-up:  1. Follow-up with interventional radiology 2. Follow-up with your primary care provider 3. Take your medications as prescribed.  Discharge Diagnoses:  Active Hospital Problems   Diagnosis Date Noted   Abscess, intra-abdominal, postoperative 10/31/2018   AKI (acute kidney injury) (HCC) 11/01/2018   RBBB 11/01/2018   Non-insulin dependent type 2 diabetes mellitus (HCC) 05/01/2018   Coronary artery disease 03/31/2018   Hx of CABG     Resolved Hospital Problems  No resolved problems to display.    Discharge Condition: Stable  Diet recommendation: Resume previous diet  Vitals:   11/12/18 0318 11/12/18 0720  BP: (!) 150/74 (!) 116/95  Pulse:  76  Resp: 12 15  Temp: 98.3 F (36.8 C) 98.7 F (37.1 C)  SpO2: 98% 97%    History of present illness:  73 year old with a history of CAD status post CABG, diabetes mellitus type 2, essential hypertension, hyperlipidemia, liver abscess, diverticulitis, cholecystitis status post laparoscopic cholecystectomy came to the hospital with complains of abdominal pain.  Eventually was found to have 7 x 9.9X 9.4 cm fluid collection.  General surgery and iron were consulted.  Patient was started on antibiotics, IR drain was placed on 11/01/18.  Over the weekend drainage increased but has slowed down over the last 24 hours.General surgery cleared for patient to go home 11/11/18.  Interventional radiology cleared patient to be discharged on 11/11/2072.   11/12/18: Patient was seen and examined at his bedside.  He reports he feels the best today he has felt in weeks.  He denies chest pain, dyspnea, abdominal pain, nausea, diarrhea.  On the day of discharge, the patient was hemodynamically stable.  He will need to follow-up with  interventional radiology and his primary care provider posthospitalization.  Patient understands and agrees to plan.  Hospital Course:  Principal Problem:   Abscess, intra-abdominal, postoperative Active Problems:   Hx of CABG   Coronary artery disease   Non-insulin dependent type 2 diabetes mellitus (HCC)   AKI (acute kidney injury) (HCC)   RBBB  Abdominal pain secondary to gallbladder fossa abscess Status post laparoscopic cholecystectomy -Status post IR drain placement on 11/01/18.  Follow-up CT abdomen performed today, shows some improvement in fluid collection in the gallbladder fossa.  Continues to have increased drainage but HIDA scan is negative for any leak. -Blood culture-negative -Wound culture is growing Klebsiella and enterococcus.  Transitioned patient from IV antibiotics to oral Bactrim and Augmentin twice daily.  Last day would be 11/12/2018. -Monitor with PO intake.   Melanotic stool, stable -This is resolved.  He is on Eliquis.  PPI.  Colonic, ileus, continues to improve -Appears to have resolved.  Advised to continue ambulation in the hallway.  Dulcolax suppository as needed..  Acute on chronic anemia -Hemoglobin is around 8.0.  Stable.  Continue to monitor this.  CAD status post CABG X4 December 2019 A fib/Flutter, controlled. S/p Bypass -Denies chest pain.  Appears comfortable from cardiac standpoint. -On Eliquis  Diabetes mellitus type 2 -Insulin sliding scale Accu-Chek.  [facility]   Code Status: Full code   Consultants:   Surgery  Radiology  Procedures:   Drain placed 11/01/18  Antimicrobials:   Augmentin  Bactrim   Discharge Exam: BP (!) 116/95 (BP Location: Left Arm)    Pulse 76  Temp 98.7 F (37.1 C) (Oral)    Resp 15    Ht  (1.803 m)    Wt 106.3 kg    SpO2 97%    BMI 32.68 kg/m   General: 73 y.o. year-old male well developed well nourished in no acute distress.  Alert and oriented  x3.  Cardiovascular: Regular rate and rhythm with no rubs or gallops.  No thyromegaly or JVD noted.    Respiratory: Clear to auscultation with no wheezes or rales. Good inspiratory effort.  Abdomen: Soft nontender nondistended with normal bowel sounds x4 quadrants.  Musculoskeletal: No lower extremity edema. 2/4 pulses in all 4 extremities.  Skin: No ulcerative lesions noted or rashes,  Psychiatry: Mood is appropriate for condition and setting  Discharge Instructions You were cared for by a hospitalist during your hospital stay. If you have any questions about your discharge medications or the care you received while you were in the hospital after you are discharged, you can call the unit and asked to speak with the hospitalist on call if the hospitalist that took care of you is not available. Once you are discharged, your primary care physician will handle any further medical issues. Please note that NO REFILLS for any discharge medications will be authorized once you are discharged, as it is imperative that you return to your primary care physician (or establish a relationship with a primary care physician if you do not have one) for your aftercare needs so that they can reassess your need for medications and monitor your lab values.   Allergies as of 11/12/2018      Reactions   Ciprofloxacin Rash   Erythromycin Ethylsuccinate Rash      Medication List    TAKE these medications   acetaminophen 500 MG tablet Commonly known as: TYLENOL Take 1,000 mg by mouth daily as needed for moderate pain.   amiodarone 200 MG tablet Commonly known as: PACERONE TAKE 1 TABLET BY MOUTH DAILY   amLODipine 5 MG tablet Commonly known as: NORVASC Take 5 mg by mouth daily.   aspirin EC 81 MG tablet Take 1 tablet (81 mg total) by mouth daily. What changed: when to take this   docusate sodium 100 MG capsule Commonly known as: COLACE Take 100 mg by mouth 2 (two) times daily.   Eliquis 5 MG Tabs  tablet Generic drug: apixaban TAKE ONE TABLET BY MOUTH TWICE DAILY   finasteride 5 MG tablet Commonly known as: PROSCAR Take 5 mg by mouth daily.   loratadine 10 MG tablet Commonly known as: CLARITIN Take 10 mg by mouth daily.   metFORMIN 1000 MG tablet Commonly known as: GLUCOPHAGE Take 1,000 mg by mouth 2 (two) times daily.   metoprolol tartrate 25 MG tablet Commonly known as: LOPRESSOR Take 1 tablet (25 mg total) by mouth 2 (two) times daily.   MULTIVITAMIN ADULT PO Take 1 tablet by mouth daily.   oxyCODONE 5 MG immediate release tablet Commonly known as: Oxy IR/ROXICODONE Take 1 tablet (5 mg total) by mouth every 6 (six) hours as needed for severe pain.   Vitamin D3 50 MCG (2000 UT) Tabs Take 2,000 Units by mouth 2 (two) times daily.      Allergies  Allergen Reactions   Ciprofloxacin Rash   Erythromycin Ethylsuccinate Rash   Follow-up Information    [name], MD. Go on 12/05/2018.   Specialty: General Surgery Why: 12:00pm  Please arrive 15 minutes early to check in. Contact information: [address]  [address] [place] [address] [phone]        [facility] Follow up.   Specialty: Home Health Services Why: [facility] RN/PT/OT arranged- they will call to set up home visits (contact # [phone])       Berdine Dance, MD Follow up.   Specialties: Interventional Radiology, Radiology Why: Schedulers will call with date and time of appointment Contact information: [address] [place] [address] [phone]            The results of significant diagnostics from this hospitalization (including imaging, microbiology, ancillary and laboratory) are listed below for reference.    Significant Diagnostic Studies: Ct Abdomen Wo Contrast  Result Date: 11/07/2018 CLINICAL DATA:  73 year old male with a history of prior gallbladder fossa abscess/hematoma EXAM: CT ABDOMEN WITHOUT CONTRAST TECHNIQUE:  Multidetector CT imaging of the abdomen was performed following the standard protocol without IV contrast. COMPARISON:  11/01/2018, 10/31/2018, 06/11/2018 FINDINGS: Lower chest: Atelectasis at the right lung base. Hepatobiliary: Similar appearance flocculent fluid in the gallbladder fossa, status post cholecystectomy, with pigtail drain catheter terminating within the fluid collection. The fluid collection is decreased in size compared to the pre drainage CT, though persists with the greatest diameter 9 cm transverse and 5.9 cm AP. The fluid collection is inseparable from the duodenum and pylorus of the stomach with loss of the fat planes. There is a fat plane maintained between the adjacent hepatic flexure and the fluid collection, although there is mild colonic wall thickening of the hepatic flexure. Pancreas: Unremarkable pancreas Spleen: Unremarkable spleen Adrenals/Urinary Tract: Unremarkable adrenal glands. Kidneys without hydronephrosis or nephrolithiasis. Similar appearance of cyst on the lower pole of the left kidney. Stomach/Bowel: Transverse colon and hepatic flexure somewhat dilated. Small bowel is decompressed. Stomach is relatively decompressed. Incomplete visualization of the bowel. Vascular/Lymphatic: Atherosclerotic calcifications. No adenopathy of the retroperitoneum. Small lymph nodes at the portacaval nodal stations. Other: None Musculoskeletal: No displaced fracture. Degenerative changes of the spine. IMPRESSION: Persisting flocculent fluid collection in the gallbladder fossa, though decreased in size status post drain placement. Inflammatory changes of the gallbladder fossa abscess are inseparable from the pylorus and descending duodenum. Associated duodenitis not excluded. A fat plane is maintained between the abscess and the hepatic flexure of the colon, however, there is mild colon wall thickening adjacent to the abscess, potentially reactive inflammation. Aortic Atherosclerosis  (ICD10-I70.0). Electronically Signed   By: Gilmer Mor D.O.   On: 11/07/2018 08:47   Dg Abd 1 View  Result Date: 11/08/2018 CLINICAL DATA:  Abdominal distention. Pt states lower abdominal pain x few days with runny stool. Hx of liver 3 abscesses, last one in June. Hx of diabetes, HTN, CAD. EXAM: ABDOMEN - 1 VIEW COMPARISON:  CT, 11/07/2018.  Abdominal radiographs, 11/06/2018. FINDINGS: No bowel dilation to suggest obstruction. There is less gas in the colon than on the prior radiographs, with no current significant colonic distention. Pigtail catheter in the right upper quadrant is stable. IMPRESSION: 1. No evidence of bowel obstruction. Decreased gaseous distention of the colon when compared to the prior radiographs from 11/06/2018. 2. Stable right upper quadrant pigtail catheter. Electronically Signed   By: Amie Portland M.D.   On: 11/08/2018 09:49   Dg Abd 1 View  Result Date: 11/06/2018 CLINICAL DATA:  Abdominal distension EXAM: ABDOMEN - 1 VIEW COMPARISON:  CT abdomen pelvis 11/01/2018 FINDINGS: Pigtail drainage catheter RIGHT upper quadrant, by history gallbladder fossa abscess drain. Gaseous distention of ascending colon 13.2 cm. Gas is  also seen throughout the transverse colon, less within small bowel and descending sigmoid colon. No bowel wall thickening identified. Bones demineralized. IMPRESSION: Gaseous distention of colon greatest at ascending colon which measures 13.2 cm transverse. Findings called to Dr. Nelson ChimesAmin on 11/06/2018 at 1335 hours. Electronically Signed   By: Ulyses SouthwardMark  Boles M.D.   On: 11/06/2018 13:26   Ct Abdomen Pelvis W Contrast  Result Date: 10/31/2018 CLINICAL DATA:  Gallbladder surgery 3 weeks ago. History of liver abscess. Fever. EXAM: CT ABDOMEN AND PELVIS WITH CONTRAST TECHNIQUE: Multidetector CT imaging of the abdomen and pelvis was performed using the standard protocol following bolus administration of intravenous contrast. CONTRAST:  80mL OMNIPAQUE IOHEXOL 300 MG/ML  SOLN  COMPARISON:  MRI of the abdomen July 01, 2018. CT scan of the abdomen and pelvis June 11, 2018. FINDINGS: Lower chest: No suspicious nodules, masses, or infiltrates in the lung bases. Lower chest is unremarkable. Hepatobiliary: Patient is status post cholecystectomy. There is a collection of debris, fluid, and gas centered in the gallbladder fossa. There is an oval region of high attenuation within the fossa, likely blood products from recent surgery. The fluid tracks superiorly along the anterior margin of the liver. Some low attenuation seen anterior to the liver on axial image 14 may represent some debris tracks superiorly. On image number 11, there is some high attenuation anterior to the liver which could represent a tiny amount of air tracking from the gallbladder fossa. The collection abuts the first and second portions of the duodenum which is thickened and likely secondarily inflamed. The greatest dimension of the abnormality in the gallbladder fossa is 7 by 9.9 by 9.4 cm. The liver parenchyma itself is normal in appearance. The portal vein is patent. Pancreas: Unremarkable. No pancreatic ductal dilatation or surrounding inflammatory changes. Spleen: Normal in size without focal abnormality. 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 Adrenal glands are otherwise normal. There are at least 3 cysts in the left kidney. No hydronephrosis or stones. No evidence of obstruction. No ureteral stones. The bladder is normal. Stomach/Bowel: The stomach is normal. There is thickening of the proximal duodenum due to the abscess in the gallbladder fossa resulting in secondary inflammation. The abscess likely arises from the recent surgery. The small bowel is otherwise normal. Colonic diverticulosis is seen without diverticulitis. The appendix is normal. Vascular/Lymphatic: Atherosclerotic changes are seen in the nonaneurysmal aorta. No adenopathy. Reproductive: Prostate is  unremarkable. Other: No abdominal wall hernia or abnormality. No abdominopelvic ascites. Musculoskeletal: No acute or significant osseous findings. IMPRESSION: 1. There is a 7 x 9.9 x 9.4 cm collection of fluid, debris, and gas centered in the gallbladder fossa with significant adjacent fat stranding. This finding is consistent with a postoperative abscess. The abscess abuts the proximal duodenum resulting in secondary wall thickening/inflammation. A small amount of debris and gas may extend anteriorly along the liver extending towards the dome. 2. Diverticulosis without diverticulitis. 3. Atherosclerotic changes in the nonaneurysmal aorta. Electronically Signed   By: Gerome Samavid  Williams III M.D   On: 10/31/2018 21:26   Ct Image Guided Drainage By Percutaneous Catheter  Result Date: 11/01/2018 INDICATION: Cholecystectomy 3 weeks ago, gallbladder fossa fluid collection EXAM: CT DRAIN GALLBLADDER FOSSA ABSCESS MEDICATIONS: The patient is currently admitted to the hospital and receiving intravenous antibiotics. The antibiotics were administered within an appropriate time frame prior to the initiation of the procedure. ANESTHESIA/SEDATION: Fentanyl 100 mcg IV; Versed 2.0 mg IV Moderate Sedation Time:  13 minutes The patient  was continuously monitored during the procedure by the interventional radiology nurse under my direct supervision. COMPLICATIONS: None immediate. PROCEDURE: Informed written consent was obtained from the patient after a thorough discussion of the procedural risks, benefits and alternatives. All questions were addressed. Maximal Sterile Barrier Technique was utilized including caps, mask, sterile gowns, sterile gloves, sterile drape, hand hygiene and skin antiseptic. A timeout was performed prior to the initiation of the procedure. Previous imaging reviewed. Patient positioned supine. Noncontrast localization CT performed. The large complex heterogeneous air-fluid collection in the gallbladder fossa  was localized. Overlying skin marked for a lateral intercostal approach. Under sterile conditions and local anesthesia, an 18 gauge 10 cm access needle was advanced into the collection. Syringe aspiration yielded dark malodorous thick blood compatible with infected hematoma. Guidewire advanced followed by tract dilatation to insert a 14 [place] drain. Drain catheter position confirmed with CT. Sample sent for culture. Catheter secured with Prolene suture and connected to external suction bulb. Sterile dressing applied. No immediate complication. Patient tolerated the procedure well. IMPRESSION: Successful CT-guided gallbladder fossa abscess drain placement. Electronically Signed   By: Judie PetitM.  Shick M.D.   On: 11/01/2018 12:09   Nm Hepato Biliary Leak  Result Date: 11/08/2018 CLINICAL DATA:  One month status post cholecystectomy. Abdominal pain and bloating. Evaluate for bile leak. EXAM: NUCLEAR MEDICINE HEPATOBILIARY IMAGING TECHNIQUE: Sequential images of the abdomen were obtained out to 60 minutes following intravenous administration of radiopharmaceutical. RADIOPHARMACEUTICALS:  5.2 mCi Tc-7617m  Choletec IV COMPARISON:  None. FINDINGS: Prompt uptake and biliary excretion of activity by the liver is seen. No gallbladder activity is seen, consistent with prior cholecystectomy. Biliary activity passes into small bowel, consistent with patent common bile duct. No extravasation of radiopharmaceutical activity is seen to suggest the presence of bile leak. IMPRESSION: Status post cholecystectomy. No evidence of postoperative bile leak or biliary obstruction. Electronically Signed   By: Danae OrleansJohn A Stahl M.D.   On: 11/08/2018 14:52    Microbiology: No results found for this or any previous visit (from the past 240 hour(s)).   Labs: Basic Metabolic Panel: Recent Labs  Lab 11/08/18 0307 11/09/18 0330 11/10/18 0436 11/11/18 0703 11/12/18 0337  NA 135 135 134* 135 134*  K 3.6 3.8 3.8 3.9 4.0  CL 101 100 100 104 101   CO2 24 23 23 24 22   GLUCOSE 106* 120* 117* 97 101*  BUN 10 10 11 9 11   CREATININE 1.24 1.29* 1.39* 1.38* 1.42*  CALCIUM 8.6* 8.9 8.8* 8.9 9.0  MG 1.9 1.9 2.0 2.1 2.0   Liver Function Tests: Recent Labs  Lab 11/06/18 0356  AST 18  ALT 16  ALKPHOS 59  BILITOT 0.6  PROT 5.6*  ALBUMIN 2.3*   No results for input(s): LIPASE, AMYLASE in the last 168 hours. No results for input(s): AMMONIA in the last 168 hours. CBC: Recent Labs  Lab 11/08/18 0307 11/09/18 0330 11/10/18 0436 11/11/18 0703 11/12/18 0337  WBC 6.6 7.6 9.3 7.8 7.5  HGB 7.7* 8.4* 8.6* 8.3* 8.4*  HCT 24.2* 26.6* 27.8* 26.4* 26.7*  MCV 84.3 85.0 85.0 85.2 84.8  PLT 386 459* 481* 383 376   Cardiac Enzymes: No results for input(s): CKTOTAL, CKMB, CKMBINDEX, TROPONINI in the last 168 hours. BNP: BNP (last 3 results) No results for input(s): BNP in the last 8760 hours.  ProBNP (last 3 results) No results for input(s): PROBNP in the last 8760 hours.  CBG: Recent Labs  Lab 11/11/18 [id number] 11/11/18 1113 11/11/18 1637 11/11/18 2108 11/12/18 [id number]  GLUCAP 91 116* 96 126* 97       Signed:  Kayleen Memos, MD [facility] 11/12/2018, 10:39 AM

## 2018-11-12 NOTE — Progress Notes (Signed)
Referring Physician(s): Dr. Donell BeersByerly  Supervising Physician: Oley BalmHassell, Daniel  [facility]  Chief Complaint: Calvin RayGall bladder fossa abscess  Subjective: "I feel better than I have in a long time."  Allergies: Ciprofloxacin and Erythromycin ethylsuccinate  Medications: Prior to Admission medications   Medication Sig Start Date End Date Taking? Authorizing Provider  acetaminophen (TYLENOL) 500 MG tablet Take 1,000 mg by mouth daily as needed for moderate pain.   Yes [provider]  amiodarone (PACERONE) 200 MG tablet TAKE 1 TABLET BY MOUTH DAILY Patient taking differently: Take 200 mg by mouth daily.  07/04/18  Yes Runell GessBerry, Jonathan J, MD  amLODipine (NORVASC) 5 MG tablet Take 5 mg by mouth daily. 02/05/18  Yes [provider]  aspirin EC 81 MG tablet Take 1 tablet (81 mg total) by mouth daily. Patient taking differently: Take 81 mg by mouth every evening.  05/01/18  Yes Kilroy, Eda PaschalLuke K, PA-C  Cholecalciferol (VITAMIN D3) 2000 units TABS Take 2,000 Units by mouth 2 (two) times daily.    Yes [provider]  docusate sodium (COLACE) 100 MG capsule Take 100 mg by mouth 2 (two) times daily.    Yes [provider]  ELIQUIS 5 MG TABS tablet TAKE ONE TABLET BY MOUTH TWICE DAILY 06/03/18  Yes Runell GessBerry, Jonathan J, MD  finasteride (PROSCAR) 5 MG tablet Take 5 mg by mouth daily. 02/05/18  Yes [provider]  loratadine (CLARITIN) 10 MG tablet Take 10 mg by mouth daily.   Yes [provider]  metFORMIN (GLUCOPHAGE) 1000 MG tablet Take 1,000 mg by mouth 2 (two) times daily. 02/05/18  Yes [provider]  metoprolol tartrate (LOPRESSOR) 25 MG tablet Take 1 tablet (25 mg total) by mouth 2 (two) times daily. 05/20/18  Yes Runell GessBerry, Jonathan J, MD  Multiple Vitamins-Minerals (MULTIVITAMIN ADULT PO) Take 1 tablet by mouth daily.   Yes [provider]  oxyCODONE (OXY IR/ROXICODONE) 5 MG immediate release tablet Take 1 tablet (5 mg  total) by mouth every 6 (six) hours as needed for severe pain. Patient not taking: Reported on 11/01/2018 10/07/18   Almond LintByerly, Faera, MD     Vital Signs: BP (!) 116/95 (BP Location: Left Arm)   Pulse 76   Temp 98.7 F (37.1 C) (Oral)   Resp 15   Ht 5\' 11"  (1.803 m)   Wt 234 lb 4.8 oz (106.3 kg)   SpO2 97%   BMI 32.68 kg/m   Physical Exam  NAD, alert Abdomen:  Soft.  Drain in place in RUQ. Yellow, bilious fluid in bulb.  Imaging: Nm Hepato Biliary Leak  Result Date: 11/08/2018 CLINICAL DATA:  One month status post cholecystectomy. Abdominal pain and bloating. Evaluate for bile leak. EXAM: NUCLEAR MEDICINE HEPATOBILIARY IMAGING TECHNIQUE: Sequential images of the abdomen were obtained out to 60 minutes following intravenous administration of radiopharmaceutical. RADIOPHARMACEUTICALS:  5.2 mCi Tc-2056m  Choletec IV COMPARISON:  None. FINDINGS: Prompt uptake and biliary excretion of activity by the liver is seen. No gallbladder activity is seen, consistent with prior cholecystectomy. Biliary activity passes into small bowel, consistent with patent common bile duct. No extravasation of radiopharmaceutical activity is seen to suggest the presence of bile leak. IMPRESSION: Status post cholecystectomy. No evidence of postoperative bile leak or biliary obstruction. Electronically Signed   By: Danae OrleansJohn A Stahl M.D.   On: 11/08/2018 14:52    Labs:  CBC: Recent Labs    11/09/18 0330 11/10/18 0436 11/11/18 0703 11/12/18 0337  WBC 7.6  9.3 7.8 7.5  HGB 8.4* 8.6* 8.3* 8.4*  HCT 26.6* 27.8* 26.4* 26.7*  PLT 459* 481* 383 376    COAGS: Recent Labs    03/14/18 1610 03/26/18 1146 03/31/18 1458 04/08/18 0238 05/10/18 0907 10/06/18 0903  INR 0.96 0.96 1.37 1.06 1.42 0.9  APTT 32 31 29  --   --   --     BMP: Recent Labs    11/09/18 0330 11/10/18 0436 11/11/18 0703 11/12/18 0337  NA 135 134* 135 134*  K 3.8 3.8 3.9 4.0  CL 100 100 104 101  CO2 23 23 24 22   GLUCOSE 120* 117* 97 101*   BUN 10 11 9 11   CALCIUM 8.9 8.8* 8.9 9.0  CREATININE 1.29* 1.39* 1.38* 1.42*  GFRNONAA 53* 49* 49* 48*  GFRAA >60 57* 57* 55*    LIVER FUNCTION TESTS: Recent Labs    11/03/18 0323 11/04/18 0259 11/05/18 0421 11/06/18 0356  BILITOT 0.6 0.6 0.7 0.6  AST 22 19 18 18   ALT 22 18 15 16   ALKPHOS 64 69 64 59  PROT 5.3* 5.4* 5.3* 5.6*  ALBUMIN 2.3* 2.3* 2.2* 2.3*    Assessment and Plan: Gall bladder fossa abscess s/p drain placement 7/4 For possible discharge with daughter today.  Provided with drain card and prescription for flushes.  Continue routine flushes.  Patient will hear from schedulers with date and time of follow-up appointment with IR.  Electronically Signed: Docia Barrier, PA 11/12/2018, 9:33 AM   I spent a total of 15 Minutes at the the patient's bedside AND on the patient's hospital floor or unit, greater than 50% of which was counseling/coordinating care for gall bladder fossa abscess.

## 2018-11-12 NOTE — Progress Notes (Signed)
Tele dc , iv removed, patient awaiting his daughter for dressing change education and transportation home.

## 2018-11-12 NOTE — Progress Notes (Signed)
Occupational Therapy Treatment Patient Details Name: [name] MRN: [id number] DOB: 01-12-1943 Today's Date: 11/12/2018    History of present illness [name] is a 73 y.o. male with medical history significant of CAD status post CABG x4, type 2 diabetes, hypertension, hyperlipidemia, diverticulitis, liver abscess, cholecystitis status post percutaneous cholecystostomy tube 1/12 and eventually lap chole on 6/9 presented to [facility] ED with complaints of abdominal pain..  S/P drain placement for old hematoma.   OT comments  Pt progressing towards OT goals. Reviewed energy conservation, able to complete UB and LB dressing at mod I level (completing figure 4 to access feet), Pt able to complete toilet transfer, sink level grooming at mod I. Pt eager for dc. Current POC appropriate.    Follow Up Recommendations  No OT follow up    Equipment Recommendations  None recommended by OT    Recommendations for Other Services      Precautions / Restrictions Precautions Precautions: Fall Restrictions Weight Bearing Restrictions: No       Mobility Bed Mobility Overal bed mobility: Modified Independent                Transfers Overall transfer level: Modified independent                    Balance Overall balance assessment: Modified Independent   Sitting balance-Leahy Scale: Normal                                     ADL either performed or assessed with clinical judgement   ADL Overall ADL's : Needs assistance/impaired     Grooming: Wash/dry face;Wash/dry hands;Oral care;Independent;Standing       Lower Body Bathing: Supervison/ safety;Sit to/from stand Lower Body Bathing Details (indicate cue type and reason): able to bring legs up     Lower Body Dressing: Modified independent;Sit to/from stand Lower Body Dressing Details (indicate cue type and reason): able to perform figure 4 Toilet Transfer: Independent;Ambulation            Functional mobility during ADLs: Modified independent;Rolling walker       Vision       Perception     Praxis      Cognition Arousal/Alertness: Awake/alert Behavior During Therapy: WFL for tasks assessed/performed Overall Cognitive Status: Within Functional Limits for tasks assessed                                          Exercises     Shoulder Instructions       General Comments      Pertinent Vitals/ Pain       Pain Assessment: Faces Faces Pain Scale: Hurts a little bit Pain Location: generalized discomfort Pain Descriptors / Indicators: Discomfort Pain Intervention(s): Limited activity within patient's tolerance;Monitored during session;Repositioned  Home Living                                          Prior Functioning/Environment              Frequency  Min 2X/week        Progress Toward Goals  OT Goals(current goals can now be found in the care plan  section)  Progress towards OT goals: Progressing toward goals  Acute Rehab OT Goals Patient Stated Goal: back to playing golf. OT Goal Formulation: With patient Time For Goal Achievement: 11/19/18 Potential to Achieve Goals: Good  Plan Discharge plan remains appropriate;Frequency remains appropriate    Co-evaluation                 AM-PAC OT "6 Clicks" Daily Activity     Outcome Measure   Help from another person eating meals?: None Help from another person taking care of personal grooming?: None Help from another person toileting, which includes using toliet, bedpan, or urinal?: None Help from another person bathing (including washing, rinsing, drying)?: None Help from another person to put on and taking off regular upper body clothing?: None Help from another person to put on and taking off regular lower body clothing?: None 6 Click Score: 24    End of Session Equipment Utilized During Treatment: Gait belt  OT Visit Diagnosis:  Other abnormalities of gait and mobility (R26.89);Muscle weakness (generalized) (M62.81);Other (comment)   Activity Tolerance Patient tolerated treatment well   Patient Left in chair;with call bell/phone within reach   Nurse Communication Mobility status        Time: 1215-1229 OT Time Calculation (min): 14 min  Charges: OT General Charges $OT Visit: 1 Visit OT Treatments $Self Care/Home Management : 8-22 mins  Sherryl MangesLaura Raylee Adamec OTR/L Acute Rehabilitation Services Pager: [phone] Office: [phone]   Evern BioLaura J Catelyn Friel 11/12/2018, 4:19 PM

## 2018-11-13 ENCOUNTER — Other Ambulatory Visit: Payer: Self-pay | Admitting: General Surgery

## 2018-11-13 DIAGNOSIS — T8143XA Infection following a procedure, organ and space surgical site, initial encounter: Secondary | ICD-10-CM

## 2018-11-18 ENCOUNTER — Other Ambulatory Visit: Payer: Self-pay | Admitting: General Surgery

## 2018-11-18 ENCOUNTER — Encounter

## 2018-11-18 ENCOUNTER — Other Ambulatory Visit: Payer: Self-pay

## 2018-11-18 ENCOUNTER — Encounter: Payer: Self-pay | Admitting: Radiology

## 2018-11-18 ENCOUNTER — Ambulatory Visit
Admission: RE | Admit: 2018-11-18 | Discharge: 2018-11-18 | Disposition: A | Discharge status: Home / Self Care | Payer: Medicare HMO | Source: Ambulatory Visit | Attending: General Surgery | Admitting: General Surgery

## 2018-11-18 ENCOUNTER — Ambulatory Visit
Admission: RE | Admit: 2018-11-18 | Discharge: 2018-11-18 | Disposition: A | Discharge status: Home / Self Care | Payer: Medicare HMO | Source: Ambulatory Visit | Attending: Student | Admitting: Student

## 2018-11-18 DIAGNOSIS — T8143XA Infection following a procedure, organ and space surgical site, initial encounter: Secondary | ICD-10-CM

## 2018-11-18 HISTORY — PX: IR RADIOLOGIST EVAL & MGMT: IMG5224

## 2018-11-18 MED ORDER — IOPAMIDOL (ISOVUE-300) INJECTION 61%
125.0000 mL | Freq: Once | INTRAVENOUS | Status: AC | PRN
Start: 2018-11-18 — End: 2018-11-18
  Administered 2018-11-18: 14:00:00 125 mL via INTRAVENOUS

## 2018-11-18 NOTE — Progress Notes (Signed)
Referring Physician(s): Dr. Barry Dienes  Chief Complaint: The patient is seen in follow up today s/p gall bladder fossa drain placement 11/01/18  History of present illness: [name] is a 73 y.o. male with past medical history of CAD, CM, HTN, on Eliquis following a CABG in December 2019. He originally presented with acute cholecystitis in January 2020 and underwent a percutaneous cholecystostomy tube placement 05/11/18 following by lap cholecystectomy 10/07/18.  He returned to [facility] ED 7/4 with RUQ pain. Found to have a large air and fluid-filled collection in the gall bladder fossa.  He underwent drain placement 11/01/18 by Dr. Annamaria Boots.  He was discharged home with drain in place and returns to IR drain clinic today for revaluation.    Patient reports ongoing weakness and poor appetite at home.  He states his recovery has been slow.  Denies fever, chills, abdominal pain, nausea, vomiting, shortness of breath. He has been flushing his catheter daily and brings a log of recorded output with him today. His output has thinned and volume has become less over time.   Past Medical History:  Diagnosis Date  . Allergies   . Anxiety   . CAD (coronary artery disease)   . Chest pain on exertion   . Diabetes ([place])   . Dyspnea    upon exertion  . Gout   . Hepatitis    at 73 years old  . Hyperlipemia   . Hypertension     Past Surgical History:  Procedure Laterality Date  . CHOLECYSTECTOMY N/A 10/07/2018   Procedure: Laparoscopic Cholecystectomy;  Surgeon: [name], MD;  Location: [place];  Service: General;  Laterality: N/A;  . CORONARY ARTERY BYPASS GRAFT N/A 03/31/2018   Procedure: CORONARY ARTERY BYPASS GRAFTING (CABG) TIMES FOUR USING LEFT INTERNAL MAMMARY ARTERY AND RIGHT GREATER SAPHENOUS VEIN HARVESTED ENDOSCOPICALLY.;  Surgeon: Ivin Poot, MD;  Location: [place];  Service: Open Heart Surgery;  Laterality: N/A;  . IR EXCHANGE BILIARY DRAIN  06/27/2018  . IR PERC CHOLECYSTOSTOMY  05/11/2018  .  IR RADIOLOGIST EVAL & MGMT  06/11/2018  . LEFT HEART CATH AND CORONARY ANGIOGRAPHY N/A 02/27/2018   Procedure: LEFT HEART CATH AND CORONARY ANGIOGRAPHY;  Surgeon: Lorretta Harp, MD;  Location: [facility];  Service: Cardiovascular;  Laterality: N/A;  . liver absess at 73 years old    . TEE WITHOUT CARDIOVERSION N/A 03/31/2018   Procedure: TRANSESOPHAGEAL ECHOCARDIOGRAM (TEE);  Surgeon: Prescott Gum, Collier Salina, MD;  Location: [place];  Service: Open Heart Surgery;  Laterality: N/A;    Allergies: Ciprofloxacin and Erythromycin ethylsuccinate  Medications: Prior to Admission medications   Medication Sig Start Date End Date Taking? Authorizing Provider  acetaminophen (TYLENOL) 500 MG tablet Take 1,000 mg by mouth daily as needed for moderate pain.    [provider]  amiodarone (PACERONE) 200 MG tablet TAKE 1 TABLET BY MOUTH DAILY Patient taking differently: Take 200 mg by mouth daily.  07/04/18   Lorretta Harp, MD  amLODipine (NORVASC) 5 MG tablet Take 5 mg by mouth daily. 02/05/18   [provider]  aspirin EC 81 MG tablet Take 1 tablet (81 mg total) by mouth daily. Patient taking differently: Take 81 mg by mouth every evening.  05/01/18   Erlene Quan, PA-C  Cholecalciferol (VITAMIN D3) 2000 units TABS Take 2,000 Units by mouth 2 (two) times daily.     [provider]  docusate sodium (COLACE) 100 MG capsule Take 100 mg by mouth 2 (two)  times daily.     [provider]  ELIQUIS 5 MG TABS tablet TAKE ONE TABLET BY MOUTH TWICE DAILY 06/03/18   Runell GessBerry, Jonathan J, MD  finasteride (PROSCAR) 5 MG tablet Take 5 mg by mouth daily. 02/05/18   [provider]  loratadine (CLARITIN) 10 MG tablet Take 10 mg by mouth daily.    [provider]  metFORMIN (GLUCOPHAGE) 1000 MG tablet Take 1,000 mg by mouth 2 (two) times daily. 02/05/18   [provider]  metoprolol tartrate (LOPRESSOR) 25 MG tablet Take 1 tablet (25 mg total) by mouth 2 (two) times  daily. 05/20/18   Runell GessBerry, Jonathan J, MD  Multiple Vitamins-Minerals (MULTIVITAMIN ADULT PO) Take 1 tablet by mouth daily.    [provider]  oxyCODONE (OXY IR/ROXICODONE) 5 MG immediate release tablet Take 1 tablet (5 mg total) by mouth every 6 (six) hours as needed for severe pain. Patient not taking: Reported on 11/01/2018 10/07/18   Almond LintByerly, Faera, MD     Family History  Problem Relation Age of Onset  . Atrial fibrillation Mother   . Stroke Mother   . Cancer Maternal Aunt     Social History   Socioeconomic History  . Marital status: Widowed    Spouse name: Not on file  . Number of children: 2  . Years of education: Not on file  . Highest education level: Not on file  Occupational History  . Occupation: retired  Engineer, productionocial Needs  . Financial resource strain: Not on file  . Food insecurity    Worry: Not on file    Inability: Not on file  . Transportation needs    Medical: Not on file    Non-medical: Not on file  Tobacco Use  . Smoking status: Former Smoker    Types: Cigarettes    Quit date: 1982    Years since quitting: 38.5  . Smokeless tobacco: Never Used  . Tobacco comment: quit 1982  Substance and Sexual Activity  . Alcohol use: Not Currently  . Drug use: Never  . Sexual activity: Not on file  Lifestyle  . Physical activity    Days per week: Not on file    Minutes per session: Not on file  . Stress: Not on file  Relationships  . Social Musicianconnections    Talks on phone: Not on file    Gets together: Not on file    Attends religious service: Not on file    Active member of club or organization: Not on file    Attends meetings of clubs or organizations: Not on file    Relationship status: Not on file  Other Topics Concern  . Not on file  Social History Narrative   [place] Retired Insurance underwritercomputer tech.     Vital Signs: There were no vitals taken for this visit.  Physical Exam  NAD, weak Abdomen:  Soft, non-tender, non-distended. RUQ  drain in place.  Yellow, cloudy output in bulb.  Insertion site intact.   Imaging: No results found.  Labs:  CBC: Recent Labs    11/09/18 0330 11/10/18 0436 11/11/18 0703 11/12/18 0337  WBC 7.6 9.3 7.8 7.5  HGB 8.4* 8.6* 8.3* 8.4*  HCT 26.6* 27.8* 26.4* 26.7*  PLT 459* 481* 383 376    COAGS: Recent Labs    03/14/18 1610 03/26/18 1146 03/31/18 1458 04/08/18 0238 05/10/18 0907 10/06/18 0903  INR 0.96 0.96 1.37 1.06 1.42 0.9  APTT 32 31 29  --   --   --  BMP: Recent Labs    11/09/18 0330 11/10/18 0436 11/11/18 0703 11/12/18 0337  NA 135 134* 135 134*  K 3.8 3.8 3.9 4.0  CL 100 100 104 101  CO2 23 23 24 22   GLUCOSE 120* 117* 97 101*  BUN 10 11 9 11   CALCIUM 8.9 8.8* 8.9 9.0  CREATININE 1.29* 1.39* 1.38* 1.42*  GFRNONAA 53* 49* 49* 48*  GFRAA >60 57* 57* 55*    LIVER FUNCTION TESTS: Recent Labs    11/03/18 0323 11/04/18 0259 11/05/18 0421 11/06/18 0356  BILITOT 0.6 0.6 0.7 0.6  AST 22 19 18 18   ALT 22 18 15 16   ALKPHOS 64 69 64 59  PROT 5.3* 5.4* 5.3* 5.6*  ALBUMIN 2.3* 2.3* 2.2* 2.3*    Assessment: Gall bladder fossa abscess s/p drain placement 7/4 Patient presents for follow-up of his gall bladder fossa drain.  He remains weak with slow recovery at home.  He endorses poor PO intake with ongoing weight loss.   Denies concerns related to drain which is currently functioning well.   CT and drain injection performed today and reviewed by Dr. Grace IsaacWatts who notes communication between the drain and the duodenum.  Patient does not have scheduled follow-up with surgery at this time. Drain to remain in place today.  No scheduled IR follow-up as intervention will direction from surgical team for best approach moving forward.  This was clearly explained in detail to the patient by Dr. Grace IsaacWatts.  He is encouraged to reach out to surgeon's office for an appointment soon.  He is connected to gravity drainage.  No flushes needed. Continue to record output. Patient  verbalizes understanding.   Signed: Hoyt KochKacie Sue-Ellen Arzell Mcgeehan, PA 11/18/2018, 2:00 PM   Please refer to Dr. Grace IsaacWatts attestation of this note for management and plan.

## 2018-11-19 ENCOUNTER — Encounter

## 2018-11-19 ENCOUNTER — Emergency Department (HOSPITAL_COMMUNITY)
Admission: EM | Admit: 2018-11-19 | Discharge: 2018-11-19 | Disposition: A | Discharge status: Home / Self Care | Payer: Medicare HMO | Attending: Emergency Medicine | Admitting: Emergency Medicine

## 2018-11-19 ENCOUNTER — Encounter (HOSPITAL_COMMUNITY): Payer: Self-pay | Admitting: Obstetrics and Gynecology

## 2018-11-19 ENCOUNTER — Other Ambulatory Visit: Payer: Self-pay

## 2018-11-19 DIAGNOSIS — Z7982 Long term (current) use of aspirin: Secondary | ICD-10-CM | POA: Insufficient documentation

## 2018-11-19 DIAGNOSIS — I10 Essential (primary) hypertension: Secondary | ICD-10-CM | POA: Insufficient documentation

## 2018-11-19 DIAGNOSIS — Z7984 Long term (current) use of oral hypoglycemic drugs: Secondary | ICD-10-CM | POA: Insufficient documentation

## 2018-11-19 DIAGNOSIS — M6281 Muscle weakness (generalized): Secondary | ICD-10-CM | POA: Insufficient documentation

## 2018-11-19 DIAGNOSIS — R531 Weakness: Secondary | ICD-10-CM

## 2018-11-19 DIAGNOSIS — E119 Type 2 diabetes mellitus without complications: Secondary | ICD-10-CM | POA: Insufficient documentation

## 2018-11-19 DIAGNOSIS — Z79899 Other long term (current) drug therapy: Secondary | ICD-10-CM | POA: Diagnosis not present

## 2018-11-19 DIAGNOSIS — Z87891 Personal history of nicotine dependence: Secondary | ICD-10-CM | POA: Diagnosis not present

## 2018-11-19 DIAGNOSIS — I2510 Atherosclerotic heart disease of native coronary artery without angina pectoris: Secondary | ICD-10-CM | POA: Insufficient documentation

## 2018-11-19 DIAGNOSIS — E860 Dehydration: Secondary | ICD-10-CM | POA: Diagnosis present

## 2018-11-19 DIAGNOSIS — Z951 Presence of aortocoronary bypass graft: Secondary | ICD-10-CM | POA: Insufficient documentation

## 2018-11-19 LAB — CBC WITH DIFFERENTIAL/PLATELET
Abs Immature Granulocytes: 0.06 K/uL (ref 0.00–0.07)
Basophils Absolute: 0.1 K/uL (ref 0.0–0.1)
Basophils Relative: 1 %
Eosinophils Absolute: 0.1 K/uL (ref 0.0–0.5)
Eosinophils Relative: 1 %
HCT: 33.7 % — ABNORMAL LOW (ref 39.0–52.0)
Hemoglobin: 10.1 g/dL — ABNORMAL LOW (ref 13.0–17.0)
Immature Granulocytes: 1 %
Lymphocytes Relative: 29 %
Lymphs Abs: 2.2 K/uL (ref 0.7–4.0)
MCH: 26.3 pg (ref 26.0–34.0)
MCHC: 30 g/dL (ref 30.0–36.0)
MCV: 87.8 fL (ref 80.0–100.0)
Monocytes Absolute: 0.7 K/uL (ref 0.1–1.0)
Monocytes Relative: 8 %
Neutro Abs: 4.7 K/uL (ref 1.7–7.7)
Neutrophils Relative %: 60 %
Platelets: 409 K/uL — ABNORMAL HIGH (ref 150–400)
RBC: 3.84 MIL/uL — ABNORMAL LOW (ref 4.22–5.81)
RDW: 17.4 % — ABNORMAL HIGH (ref 11.5–15.5)
WBC: 7.8 K/uL (ref 4.0–10.5)
nRBC: 0 % (ref 0.0–0.2)

## 2018-11-19 LAB — URINALYSIS, ROUTINE W REFLEX MICROSCOPIC
Bilirubin Urine: NEGATIVE
Glucose, UA: NEGATIVE mg/dL
Hgb urine dipstick: NEGATIVE
Ketones, ur: NEGATIVE mg/dL
Leukocytes,Ua: NEGATIVE
Nitrite: NEGATIVE
Protein, ur: NEGATIVE mg/dL
Specific Gravity, Urine: 1.006 (ref 1.005–1.030)
pH: 5 (ref 5.0–8.0)

## 2018-11-19 LAB — COMPREHENSIVE METABOLIC PANEL
ALT: 22 U/L (ref 0–44)
AST: 24 U/L (ref 15–41)
Albumin: 4 g/dL (ref 3.5–5.0)
Alkaline Phosphatase: 62 U/L (ref 38–126)
Anion gap: 13 (ref 5–15)
BUN: 22 mg/dL (ref 8–23)
CO2: 22 mmol/L (ref 22–32)
Calcium: 9.6 mg/dL (ref 8.9–10.3)
Chloride: 98 mmol/L (ref 98–111)
Creatinine, Ser: 1.4 mg/dL — ABNORMAL HIGH (ref 0.61–1.24)
GFR calc Af Amer: 56 mL/min — ABNORMAL LOW (ref >60)
GFR calc non Af Amer: 48 mL/min — ABNORMAL LOW (ref >60)
Glucose, Bld: 104 mg/dL — ABNORMAL HIGH (ref 70–99)
Potassium: 4.3 mmol/L (ref 3.5–5.1)
Sodium: 133 mmol/L — ABNORMAL LOW (ref 135–145)
Total Bilirubin: 0.6 mg/dL (ref 0.3–1.2)
Total Protein: 7.5 g/dL (ref 6.5–8.1)

## 2018-11-19 LAB — CBG MONITORING, ED: Glucose-Capillary: 92 mg/dL (ref 70–99)

## 2018-11-19 MED ORDER — SODIUM CHLORIDE 0.9 % IV BOLUS
1000.0000 mL | Freq: Once | INTRAVENOUS | Status: AC
Start: 2018-11-19 — End: 2018-11-19
  Administered 2018-11-19: 1000 mL via INTRAVENOUS

## 2018-11-19 NOTE — ED Triage Notes (Signed)
Pt reports he was sent by his PCP as he was just discharged from the hospital a few days ago and the patient reports he has a hole in his intestine. Pt reports he is suffering from dehydration and wanted him to go to the ER. Pt reports his stool is being ruled out for c-diff.

## 2018-11-19 NOTE — ED Provider Notes (Signed)
[facility]-EMERGENCY DEPT Provider Note   CSN: [id number] Arrival date & time: 11/19/18  1632    History   Chief Complaint Chief Complaint  Patient presents with   Dehydration    HPI [name] is a 73 y.o. male.     HPI  73 year old male presents with dehydration.  He feels generally weak.  He has been having this weakness since he left the hospital on 7/15.  Yesterday he had a CT scan and the plan was to take out his gallbladder drain but they had to leave it in based on a concern of communication to the descending duodenum.  The patient states he has been eating and drinking less, mostly because things do not taste well anymore.  No vomiting.  He feels diffusely weak and a little bit lightheaded.  There is no headache, chest pain.  He has some soreness at his right side where the tube is but no other abdominal pain or vomiting.  Has been having diarrhea for a while and his home health nurse collected a stool sample for C. difficile earlier this morning. Dr. Arita MissByerly's office called him and told him he should go to [facility] today.  Past Medical History:  Diagnosis Date   Allergies    Anxiety    CAD (coronary artery disease)    Chest pain on exertion    Diabetes (HCC)    Dyspnea    upon exertion   Gout    Hepatitis    at 73 years old   Hyperlipemia    Hypertension     Patient Active Problem List   Diagnosis Date Noted   AKI (acute kidney injury) (HCC) 11/01/2018   RBBB 11/01/2018   Abscess, intra-abdominal, postoperative 10/31/2018   Acute gangrenous cholecystitis 05/12/2018   Protein-calorie malnutrition, severe 05/10/2018   Liver abscess 05/09/2018   Acute diverticulitis 05/09/2018   Atrial flutter (HCC) 05/01/2018   Anemia 05/01/2018   Non-insulin dependent type 2 diabetes mellitus (HCC) 05/01/2018   Coronary artery disease 03/31/2018   Hx of CABG    Essential hypertension 02/25/2018   Hyperlipidemia 02/25/2018     Past Surgical History:  Procedure Laterality Date   CHOLECYSTECTOMY N/A 10/07/2018   Procedure: Laparoscopic Cholecystectomy;  Surgeon: Almond LintByerly, Faera, MD;  Location: [facility] OR;  Service: General;  Laterality: N/A;   CORONARY ARTERY BYPASS GRAFT N/A 03/31/2018   Procedure: CORONARY ARTERY BYPASS GRAFTING (CABG) TIMES FOUR USING LEFT INTERNAL MAMMARY ARTERY AND RIGHT GREATER SAPHENOUS VEIN HARVESTED ENDOSCOPICALLY.;  Surgeon: Kerin PernaVan Trigt, Peter, MD;  [facility];  Service: Open Heart Surgery;  Laterality: N/A;   IR EXCHANGE BILIARY DRAIN  06/27/2018   IR PERC CHOLECYSTOSTOMY  05/11/2018   IR RADIOLOGIST EVAL & MGMT  06/11/2018   IR RADIOLOGIST EVAL & MGMT  11/18/2018   LEFT HEART CATH AND CORONARY ANGIOGRAPHY N/A 02/27/2018   Procedure: LEFT HEART CATH AND CORONARY ANGIOGRAPHY;  Surgeon: Runell GessBerry, Jonathan J, MD;  Location: [facility] INVASIVE CV LAB;  Service: Cardiovascular;  Laterality: N/A;   liver absess at 73 years old     TEE WITHOUT CARDIOVERSION N/A 03/31/2018   Procedure: TRANSESOPHAGEAL ECHOCARDIOGRAM (TEE);  Surgeon: Donata ClayVan Trigt, Theron AristaPeter, MD;  [facility];  Service: Open Heart Surgery;  Laterality: N/A;        Home Medications    Prior to Admission medications   Medication Sig Start Date End Date Taking? Authorizing Provider  acetaminophen (TYLENOL) 500 MG tablet Take 1,000 mg by mouth daily as needed  for moderate pain.   Yes [provider]  amiodarone (PACERONE) 200 MG tablet TAKE 1 TABLET BY MOUTH DAILY Patient taking differently: Take 200 mg by mouth daily.  07/04/18  Yes Lorretta Harp, MD  amLODipine (NORVASC) 5 MG tablet Take 5 mg by mouth daily. 02/05/18  Yes [provider]  aspirin EC 81 MG tablet Take 1 tablet (81 mg total) by mouth daily. Patient taking differently: Take 81 mg by mouth every evening.  05/01/18  Yes Kilroy, Doreene Burke, PA-C  Cholecalciferol (VITAMIN D3) 2000 units TABS Take 2,000 Units by mouth 2 (two) times daily.    Yes [provider]  docusate sodium (COLACE) 100 MG capsule Take 100 mg by mouth 2 (two) times daily.    Yes [provider]  ELIQUIS 5 MG TABS tablet TAKE ONE TABLET BY MOUTH TWICE DAILY 06/03/18  Yes Lorretta Harp, MD  finasteride (PROSCAR) 5 MG tablet Take 5 mg by mouth daily. 02/05/18  Yes [provider]  loratadine (CLARITIN) 10 MG tablet Take 10 mg by mouth daily.   Yes [provider]  metFORMIN (GLUCOPHAGE) 1000 MG tablet Take 1,000 mg by mouth 2 (two) times daily. 02/05/18  Yes [provider]  metoprolol tartrate (LOPRESSOR) 25 MG tablet Take 1 tablet (25 mg total) by mouth 2 (two) times daily. 05/20/18  Yes Lorretta Harp, MD  Multiple Vitamins-Minerals (MULTIVITAMIN ADULT PO) Take 1 tablet by mouth daily.   Yes [provider]  oxyCODONE (OXY IR/ROXICODONE) 5 MG immediate release tablet Take 1 tablet (5 mg total) by mouth every 6 (six) hours as needed for severe pain. Patient not taking: Reported on 11/01/2018 10/07/18   [name], MD    Family History Family History  Problem Relation Age of Onset   Atrial fibrillation Mother    Stroke Mother    Cancer Maternal Aunt     Social History Social History   Tobacco Use   Smoking status: Former Smoker    Types: Cigarettes    Quit date: 1982    Years since quitting: 38.5   Smokeless tobacco: Never Used   Tobacco comment: quit 1982  Substance Use Topics   Alcohol use: Not Currently   Drug use: Never     Allergies   Ciprofloxacin and Erythromycin ethylsuccinate   Review of Systems Review of Systems  Constitutional: Positive for fatigue. Negative for fever.  Respiratory: Negative for shortness of breath.   Cardiovascular: Negative for chest pain.  Gastrointestinal: Positive for abdominal pain and diarrhea. Negative for vomiting.  Neurological: Positive for weakness.  All other systems reviewed and are negative.    Physical Exam Updated Vital Signs BP 138/71 (BP Location:  Right Arm)    Pulse 81    Temp (!) 97.5 F (36.4 C) (Oral)    Resp 17    SpO2 99%   Physical Exam Vitals signs and nursing note reviewed.  Constitutional:      General: He is not in acute distress.    Appearance: He is well-developed. He is obese. He is not ill-appearing or diaphoretic.  HENT:     Head: Normocephalic and atraumatic.     Right Ear: External ear normal.     Left Ear: External ear normal.     Nose: Nose normal.  Eyes:     General:        Right eye: No discharge.        Left eye: No discharge.  Neck:  Musculoskeletal: Neck supple.  Cardiovascular:     Rate and Rhythm: Normal rate and regular rhythm.     Heart sounds: Normal heart sounds.  Pulmonary:     Effort: Pulmonary effort is normal.     Breath sounds: Normal breath sounds.  Abdominal:     Palpations: Abdomen is soft.     Tenderness: There is abdominal tenderness (mild).  Skin:    General: Skin is warm and dry.  Neurological:     Mental Status: He is alert.     Comments: CN 3-12 grossly intact. 5/5 strength in all 4 extremities. Grossly normal sensation. Normal finger to nose.   Psychiatric:        Mood and Affect: Mood is not anxious.      ED Treatments / Results  Labs (all labs ordered are listed, but only abnormal results are displayed) Labs Reviewed  COMPREHENSIVE METABOLIC PANEL - Abnormal; Notable for the following components:      Result Value   Sodium 133 (*)    Glucose, Bld 104 (*)    Creatinine, Ser 1.40 (*)    GFR calc non Af Amer 48 (*)    GFR calc Af Amer 56 (*)    All other components within normal limits  CBC WITH DIFFERENTIAL/PLATELET - Abnormal; Notable for the following components:   RBC 3.84 (*)    Hemoglobin 10.1 (*)    HCT 33.7 (*)    RDW 17.4 (*)    Platelets 409 (*)    All other components within normal limits  URINALYSIS, ROUTINE W REFLEX MICROSCOPIC  CBG MONITORING, ED    EKG EKG Interpretation  Date/Time:  Wednesday November 19 2018 20:40:25 EDT Ventricular  Rate:  74 PR Interval:    QRS Duration: 147 QT Interval:  468 QTC Calculation: 520 R Axis:   -4 Text Interpretation:  Sinus rhythm Right bundle branch block Inferior infarct, old no significant change since October 31 2018 Confirmed by Pricilla LovelessGoldston, Bellamarie Pflug [phone]([id number]) on 11/19/2018 8:59:58 PM   Radiology Ct Abdomen Pelvis W Contrast  Result Date: 11/18/2018 CLINICAL DATA:  History perforated acute cholecystitis post percutaneous cholecystostomy tube placement 05/11/2018. Patient subsequent underwent cholecystectomy on 10/07/2018 however subsequent CT scan of the abdomen pelvis performed 10/31/2018 demonstrated development of a complex abscess within the gallbladder fossa for which the patient underwent CT-guided percutaneous drainage catheter placement on 11/01/2018. Subsequent CT scan performed 11/07/2018 demonstrated appropriate positioning of the percutaneous drainage catheter with persistent complex fluid collection within gallbladder fossa Patient presents today to the interventional radiology drain Clinic for drainage catheter evaluation and management. Patient is continuing to flush the percutaneous drainage catheter. Patient reports persistent output from the drainage catheter of at least 20 cc per day. EXAM: CT ABDOMEN AND PELVIS WITH CONTRAST TECHNIQUE: Multidetector CT imaging of the abdomen and pelvis was performed using the standard protocol following bolus administration of intravenous contrast. CONTRAST:  125mL ISOVUE-300 IOPAMIDOL (ISOVUE-300) INJECTION 61% COMPARISON:  CT abdomen pelvis-05/09/2018; 10/31/2018; 11/07/2018; image guided cholecystostomy tube placement-05/11/2018; CT-guided percutaneous drainage catheter placement-11/01/2018 FINDINGS: Lower chest: Limited visualization of the lower thorax is negative for focal airspace opacity or pleural effusion. Normal heart size. Post median sternotomy and CABG. Coronary arteries calcifications. No pericardial effusion. Hepatobiliary: Normal hepatic  contour. Unchanged positioning of percutaneous drainage catheter with end coiled and locked within the gallbladder fossa, however the medial aspect of the percutaneous catheter appears to communicate with the adjacent descending portion the duodenum (coronal image 87, series 6; axial image 34, series 2). There  has been significant reduction in size of complex fluid within the gallbladder fossa, now with small amount of surrounding predominantly air collection measuring approximately 4.6 x 2.7 cm (axial image 31, series 2), previously, the complex collection measured at least 9.0 x 5.9 cm. No intra or extrahepatic biliary duct dilatation. No ascites. Pancreas: Normal appearance of the pancreas. Spleen: Normal appearance of the spleen. Adrenals/Urinary Tract: There is symmetric enhancement of the bilateral kidneys. Note is again made of an approximately 1.9 cm hypoattenuating left-sided renal cyst (image 52, series 5) additional bilateral subcentimeter hypoattenuating renal lesions are too small to accurately characterize though favored to represent additional renal cysts. No definite renal stones this postcontrast. There is a minimal amount of grossly symmetric likely age and body habitus related perinephric stranding. No urinary obstruction. Normal appearance the bilateral adrenal glands. Normal appearance of the urinary bladder given underdistention. Stomach/Bowel: As above, the medial end of the percutaneous drainage catheter appears to communicate with the descending portion the duodenum. Rather extensive colonic diverticulosis without evidence of superimposed acute diverticulitis. Normal appearance of the terminal ileum and the retrocecal appendix. No pneumoperitoneum, pneumatosis or portal venous gas. Vascular/Lymphatic: Atherosclerotic plaque within a normal caliber abdominal aorta. The major branch vessels of the abdominal aorta appear patent on this non CTA examination. No bulky retroperitoneal, mesenteric,  pelvic or inguinal lymphadenopathy. Reproductive: Normal appearance the prostate gland. No free fluid the pelvic cul-de-sac. Other: Regional soft tissues appear normal. Musculoskeletal: No acute or aggressive osseous abnormalities. Moderate severe multilevel lumbar spine DDD, worse at L4-L5 and to a lesser extent, L2-L3, L3-L4-L5-S1 with disc space height loss, endplate irregularity and sclerosis. 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 IMPRESSION: 1. Significant reduction size of complex fluid collection with the gallbladder fossa with dominant residual predominantly air containing component now measuring 4.6 cm, previously, 9.0 cm, however the medial end of the percutaneous drainage catheter appears to communicate with the adjacent descending portion of the duodenum. 2. Colonic diverticulosis without evidence of superimposed acute diverticulitis. 3.  Aortic Atherosclerosis (ICD10-I70.0). PLAN: Patient subsequently underwent percutaneous drainage catheter injection. Electronically Signed   By: Simonne ComeJohn  Watts M.D.   On: 11/18/2018 15:21   Dg Sinus/fist Tube Chk-non Gi  Result Date: 11/18/2018 CLINICAL DATA:  History perforated acute cholecystitis post percutaneous cholecystostomy tube placement 05/11/2018. Patient subsequent underwent cholecystectomy on 10/07/2018 however subsequent CT scan of the abdomen pelvis performed 10/31/2018 demonstrated development of a complex abscess within the gallbladder fossa for which the patient underwent CT-guided percutaneous drainage catheter placement on 11/01/2018. Subsequent CT scan performed 11/07/2018 demonstrated appropriate positioning of the percutaneous drainage catheter with persistent complex fluid collection within the gallbladder fossa. The patient presents today to the interventional radiology drain Clinic for drainage catheter evaluation and management. Patient is continuing to flush the percutaneous drainage catheter. He reports persistent output from the  drainage catheter of at least 20 cc per day. CT scan of the abdomen and pelvis performed earlier today demonstrates significant reduction in the complex fluid collection with the gallbladder fossa however the medial aspect of the percutaneous drainage catheter appears to communicate with the descending duodenum. EXAM: ABSCESS INJECTION COMPARISON:  CT abdomen and pelvis - 05/09/2018; 10/31/2018; 11/07/2018; image guided cholecystostomy tube placement - 05/11/2018; CT-guided percutaneous drainage catheter placement - 11/01/2018 CONTRAST:  10 cc Omnipaque 300, administered via the existing percutaneous drainage catheter FLUOROSCOPY TIME:  42 seconds (65 mGy) TECHNIQUE: The patient was positioned supine on the fluoroscopy table. A preprocedural spot fluoroscopic image was obtained of the right  upper abdominal quadrant and existing percutaneous drainage catheter. Multiple spot fluoroscopic and radiographic images were obtained following the injection of a small amount of contrast via the existing percutaneous drainage catheter. Images reviewed in the procedure was terminated. Drainage catheter was flushed with a small amount of saline and converted from a JP bulb to a gravity bag. A dressing was placed. The patient tolerated the procedure well without immediate postprocedural complication. FINDINGS: Preprocedural spot fluoroscopic image demonstrates unchanged positioning of the right upper abdominal percutaneous drainage catheter with end coiled and locked overlying the expected location of the gallbladder fossa. Excreted contrast from recent CT scan is seen within the right renal collecting system. Contrast injection demonstrates opacification of the decompressed abscess cavity with brisk communication to the adjacent descending duodenum. IMPRESSION: Drainage catheter injection confirms brisk communication between the decompressed abscess cavity within the gallbladder fossa and the descending duodenum as was suggested  on preceding CT scan of the abdomen and pelvis. Electronically Signed   By: Simonne Come M.D.   On: 11/18/2018 15:38   Ir Radiologist Eval & Mgmt  Result Date: 11/18/2018 Please refer to notes tab for details about interventional procedure. (Op Note)   Procedures Procedures (including critical care time)  Medications Ordered in ED Medications  sodium chloride 0.9 % bolus 1,000 mL (0 mLs Intravenous Stopped 11/19/18 2217)     Initial Impression / Assessment and Plan / ED Course  I have reviewed the triage vital signs and the nursing notes.  Pertinent labs & imaging results that were available during my care of the patient were reviewed by me and considered in my medical decision making (see chart for details).        Patient is overall well appearing. I think his weakness is overall progressive from his recent hospitalizations and comorbidities, but no acute findings today. Benign neuro exam. VSS. I don't think he needs admission. I d/w Dr. Carolynne Edouard, who notes he was told to come to [facility] because his surgeon is here tomorrow in case he were admitted, but there are no acute surgical issues. D/c home with return precautions.  Final Clinical Impressions(s) / ED Diagnoses   Final diagnoses:  Generalized weakness    ED Discharge Orders    None       Pricilla Loveless, MD 11/19/18 2309

## 2018-11-27 ENCOUNTER — Other Ambulatory Visit: Payer: Self-pay | Admitting: General Surgery

## 2018-11-27 DIAGNOSIS — K810 Acute cholecystitis: Secondary | ICD-10-CM

## 2018-11-28 ENCOUNTER — Telehealth: Payer: Self-pay | Admitting: *Deleted

## 2018-11-28 NOTE — Telephone Encounter (Signed)
Spoke with patient and he said he wanted to reschedule his apointment for a later date as he had a lot going on.

## 2018-12-01 ENCOUNTER — Other Ambulatory Visit: Payer: Self-pay | Admitting: General Surgery

## 2018-12-01 DIAGNOSIS — K810 Acute cholecystitis: Secondary | ICD-10-CM

## 2018-12-02 ENCOUNTER — Encounter

## 2018-12-02 ENCOUNTER — Other Ambulatory Visit: Payer: Medicare HMO

## 2018-12-03 ENCOUNTER — Encounter

## 2018-12-03 ENCOUNTER — Ambulatory Visit: Payer: Medicare HMO | Admitting: Cardiovascular Disease

## 2018-12-03 ENCOUNTER — Ambulatory Visit
Admission: RE | Admit: 2018-12-03 | Discharge: 2018-12-03 | Disposition: A | Discharge status: Home / Self Care | Payer: Medicare HMO | Source: Ambulatory Visit | Attending: General Surgery | Admitting: General Surgery

## 2018-12-03 ENCOUNTER — Encounter: Payer: Self-pay | Admitting: Radiology

## 2018-12-03 DIAGNOSIS — K810 Acute cholecystitis: Secondary | ICD-10-CM

## 2018-12-03 HISTORY — PX: IR RADIOLOGIST EVAL & MGMT: IMG5224

## 2018-12-03 MED ORDER — IOPAMIDOL (ISOVUE-300) INJECTION 61%
100.0000 mL | Freq: Once | INTRAVENOUS | Status: AC | PRN
Start: 2018-12-03 — End: 2018-12-03
  Administered 2018-12-03: 100 mL via INTRAVENOUS

## 2018-12-03 NOTE — Progress Notes (Signed)
Referring Physician(s): Byerly,Faera  Chief Complaint: The patient is seen in follow up today s/p 11/18/18:  Drainage catheter injection confirms brisk communication between the decompressed abscess cavity within the gallbladder fossa and the descending duodenum as was suggested on preceding CT scan of the abdomen and pelvis.  History of present illness:  He originally presented with acute cholecystitis in January 2020 and underwent a percutaneous cholecystostomy tube placement 05/11/18 following by lap cholecystectomy 10/07/18. [facility] with RUQ pain. Found to have a large air and fluid-filled collection in the gall bladder fossa.  He underwent drain placement 11/01/18 by Dr. Miles CostainShick.  He was discharged home with drain in place. 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  Here today for re CT and drain injection He has stopped flushing drain Now to bag instead of JP OP is much less per pt OP in bag today is a collection of several days: approx 30 cc in bag  Complains of general weakness; loss of appetite Denies pain; N/V Denies fever chills  Past Medical History:  Diagnosis Date  . Allergies   . Anxiety   . CAD (coronary artery disease)   . Chest pain on exertion   . Diabetes (HCC)   . Dyspnea    upon exertion  . Gout   . Hepatitis    at 73 years old  . Hyperlipemia   . Hypertension     Past Surgical History:  Procedure Laterality Date  . CHOLECYSTECTOMY N/A 10/07/2018   Procedure: Laparoscopic Cholecystectomy;  Surgeon: Almond LintByerly, Faera, MD;  Location: [facility] OR;  Service: General;  Laterality: N/A;  . CORONARY ARTERY BYPASS GRAFT N/A 03/31/2018   Procedure: CORONARY ARTERY BYPASS GRAFTING (CABG) TIMES FOUR USING LEFT INTERNAL MAMMARY ARTERY AND RIGHT GREATER SAPHENOUS VEIN HARVESTED ENDOSCOPICALLY.;  Surgeon: Kerin PernaVan Trigt, Peter, MD;  [facility];  Service: Open Heart Surgery;  Laterality: N/A;  . IR EXCHANGE  BILIARY DRAIN  06/27/2018  . IR PERC CHOLECYSTOSTOMY  05/11/2018  . IR RADIOLOGIST EVAL & MGMT  06/11/2018  . IR RADIOLOGIST EVAL & MGMT  11/18/2018  . IR RADIOLOGIST EVAL & MGMT  12/03/2018  . LEFT HEART CATH AND CORONARY ANGIOGRAPHY N/A 02/27/2018   Procedure: LEFT HEART CATH AND CORONARY ANGIOGRAPHY;  Surgeon: Runell GessBerry, Jonathan J, MD;  Location: [facility] INVASIVE CV LAB;  Service: Cardiovascular;  Laterality: N/A;  . liver absess at 73 years old    . TEE WITHOUT CARDIOVERSION N/A 03/31/2018   Procedure: TRANSESOPHAGEAL ECHOCARDIOGRAM (TEE);  Surgeon: Donata ClayVan Trigt, Theron AristaPeter, MD;  [facility];  Service: Open Heart Surgery;  Laterality: N/A;    Allergies: Ciprofloxacin and Erythromycin ethylsuccinate  Medications: Prior to Admission medications   Medication Sig Start Date End Date Taking? Authorizing Provider  acetaminophen (TYLENOL) 500 MG tablet Take 1,000 mg by mouth daily as needed for moderate pain.    [provider]  amiodarone (PACERONE) 200 MG tablet TAKE 1 TABLET BY MOUTH DAILY Patient taking differently: Take 200 mg by mouth daily.  07/04/18   Runell GessBerry, Jonathan J, MD  amLODipine (NORVASC) 5 MG tablet Take 5 mg by mouth daily. 02/05/18   [provider]  aspirin EC 81 MG tablet Take 1 tablet (81 mg total) by mouth daily. Patient taking differently: Take 81 mg by mouth every evening.  05/01/18   Abelino DerrickKilroy, Luke K, PA-C  Cholecalciferol (VITAMIN D3) 2000 units TABS Take 2,000 Units by mouth 2 (two)  times daily.     [provider]  docusate sodium (COLACE) 100 MG capsule Take 100 mg by mouth 2 (two) times daily.     [provider]  ELIQUIS 5 MG TABS tablet TAKE ONE TABLET BY MOUTH TWICE DAILY 06/03/18   Lorretta Harp, MD  finasteride (PROSCAR) 5 MG tablet Take 5 mg by mouth daily. 02/05/18   [provider]  loratadine (CLARITIN) 10 MG tablet Take 10 mg by mouth daily.    [provider]  metFORMIN (GLUCOPHAGE) 1000 MG tablet Take 1,000 mg by mouth 2  (two) times daily. 02/05/18   [provider]  metoprolol tartrate (LOPRESSOR) 25 MG tablet Take 1 tablet (25 mg total) by mouth 2 (two) times daily. 05/20/18   Lorretta Harp, MD  Multiple Vitamins-Minerals (MULTIVITAMIN ADULT PO) Take 1 tablet by mouth daily.    [provider]  oxyCODONE (OXY IR/ROXICODONE) 5 MG immediate release tablet Take 1 tablet (5 mg total) by mouth every 6 (six) hours as needed for severe pain. Patient not taking: Reported on 11/01/2018 10/07/18   [name], MD     Family History  Problem Relation Age of Onset  . Atrial fibrillation Mother   . Stroke Mother   . Cancer Maternal Aunt     Social History   Socioeconomic History  . Marital status: Widowed    Spouse name: Not on file  . Number of children: 2  . Years of education: Not on file  . Highest education level: Not on file  Occupational History  . Occupation: retired  Scientific laboratory technician  . Financial resource strain: Not on file  . Food insecurity    Worry: Not on file    Inability: Not on file  . Transportation needs    Medical: Not on file    Non-medical: Not on file  Tobacco Use  . Smoking status: Former Smoker    Types: Cigarettes    Quit date: 1982    Years since quitting: 38.6  . Smokeless tobacco: Never Used  . Tobacco comment: quit 1982  Substance and Sexual Activity  . Alcohol use: Not Currently  . Drug use: Never  . Sexual activity: Not on file  Lifestyle  . Physical activity    Days per week: Not on file    Minutes per session: Not on file  . Stress: Not on file  Relationships  . Social Herbalist on phone: Not on file    Gets together: Not on file    Attends religious service: Not on file    Active member of club or organization: Not on file    Attends meetings of clubs or organizations: Not on file    Relationship status: Not on file  Other Topics Concern  . Not on file  Social History Narrative   Lives alone in [place]/ Retired Field seismologist.     Vital Signs: BP (!) 151/75   Pulse 83   Temp 98.1 F (36.7 C)   SpO2 99%   Physical Exam Vitals signs reviewed.  Skin:    General: Skin is warm and dry.     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     Imaging: Ir Radiologist Eval & Mgmt  Result Date: 12/03/2018 Please refer to notes  tab for details about interventional procedure. (Op Note)   Labs:  CBC: Recent Labs    11/10/18 0436 11/11/18 0703 11/12/18 0337 11/19/18 2010  WBC 9.3 7.8 7.5 7.8  HGB 8.6* 8.3* 8.4* 10.1*  HCT 27.8* 26.4* 26.7* 33.7*  PLT 481* 383 376 409*    COAGS: Recent Labs    03/14/18 1610 03/26/18 1146 03/31/18 1458 04/08/18 0238 05/10/18 0907 10/06/18 0903  INR 0.96 0.96 1.37 1.06 1.42 0.9  APTT 32 31 29  --   --   --     BMP: Recent Labs    11/10/18 0436 11/11/18 0703 11/12/18 0337 11/19/18 2010  NA 134* 135 134* 133*  K 3.8 3.9 4.0 4.3  CL 100 104 101 98  CO2 23 24 22 22   GLUCOSE 117* 97 101* 104*  BUN 11 9 11 22   CALCIUM 8.8* 8.9 9.0 9.6  CREATININE 1.39* 1.38* 1.42* 1.40*  GFRNONAA 49* 49* 48* 48*  GFRAA 57* 57* 55* 56*    LIVER FUNCTION TESTS: Recent Labs    11/04/18 0259 11/05/18 0421 11/06/18 0356 11/19/18 2010  BILITOT 0.6 0.7 0.6 0.6  AST 19 18 18 24   ALT 18 15 16 22   ALKPHOS 69 64 59 62  PROT 5.4* 5.3* 5.6* 7.5  ALBUMIN 2.3* 2.2* 2.3* 4.0    Assessment:  GB fossa abscess drain intact 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 ?surgical candidate? Gradual downsizing drain? These options were touched on with pt Plan per Dr Donell BeersByerly Pt has good understanding of plan  Signed: Robet Leuamela A Shelisa Fern, PA-C 12/03/2018, 1:27 PM   Please refer  to Dr. Grace IsaacWatts attestation of this note for management and plan.

## 2018-12-05 NOTE — Progress Notes (Signed)
Interventional Radiology Note  Patient has perc chole tube that has fistulized to duodenum.  Discussed with Dr. Barry Dienes today. He has damaged tube at the skin site.   Replacement vs observation.  He has scant output.  Reasonable to observe him now and maintain the damaged drain at the skin.   If worsens, VIR can exchange.  If he does ok, drain may be removed.   Signed,  Dulcy Fanny. Earleen Newport, DO

## 2018-12-08 ENCOUNTER — Other Ambulatory Visit (HOSPITAL_COMMUNITY): Payer: Self-pay | Admitting: General Surgery

## 2018-12-08 DIAGNOSIS — K8012 Calculus of gallbladder with acute and chronic cholecystitis without obstruction: Secondary | ICD-10-CM

## 2018-12-09 ENCOUNTER — Encounter (HOSPITAL_COMMUNITY): Payer: Self-pay | Admitting: Interventional Radiology

## 2018-12-09 ENCOUNTER — Encounter

## 2018-12-09 ENCOUNTER — Other Ambulatory Visit: Payer: Self-pay

## 2018-12-09 ENCOUNTER — Ambulatory Visit (HOSPITAL_COMMUNITY)
Admission: RE | Admit: 2018-12-09 | Discharge: 2018-12-09 | Disposition: A | Discharge status: Home / Self Care | Payer: Medicare HMO | Source: Ambulatory Visit | Attending: General Surgery | Admitting: General Surgery

## 2018-12-09 DIAGNOSIS — K8012 Calculus of gallbladder with acute and chronic cholecystitis without obstruction: Secondary | ICD-10-CM | POA: Insufficient documentation

## 2018-12-09 DIAGNOSIS — Z4803 Encounter for change or removal of drains: Secondary | ICD-10-CM | POA: Insufficient documentation

## 2018-12-09 HISTORY — PX: IR CATHETER TUBE CHANGE: IMG717

## 2018-12-09 MED ORDER — LIDOCAINE HCL 1 % IJ SOLN
INTRAMUSCULAR | Status: DC
Start: 2018-12-09 — End: 2018-12-10
  Filled 2018-12-09: qty 20

## 2018-12-09 MED ORDER — LIDOCAINE HCL 1 % IJ SOLN
INTRAMUSCULAR | Status: DC | PRN
Start: 2018-12-09 — End: 2018-12-10
  Administered 2018-12-09: 5 mL

## 2018-12-09 MED ORDER — IOHEXOL 300 MG/ML  SOLN
50.0000 mL | Freq: Once | INTRAMUSCULAR | Status: AC | PRN
Start: 2018-12-09 — End: 2018-12-09
  Administered 2018-12-09: 10 mL

## 2018-12-23 ENCOUNTER — Telehealth: Payer: Self-pay | Admitting: Cardiovascular Disease

## 2018-12-23 NOTE — Telephone Encounter (Signed)
1. What dental office are you calling from? Dr. Josph Macho  2. What is your office phone number? [phone]   3. What is your fax number?  4. What type of procedure is the patient having performed? Patient doesn't know   5. What date is procedure scheduled or is the patient there now? 12/24/18 (if the patient is at the dentist's office question goes to their cardiologist if he/she is in the office.  If not, question should go to the DOD).   6. What is your question (ex. Antibiotics prior to procedure, holding medication-we need to know how long dentist wants pt to hold med)? Patient doesn't know

## 2018-12-23 NOTE — Telephone Encounter (Signed)
Spoke to Dr. Danella Sensing office. They stated pt needs clearance for dental work. He has been having a toothache and having an exam tomorrow (8/26) to evaluate. They need clearance in case of needing to pull a tooth or pre-med prior to dental work.

## 2018-12-23 NOTE — Telephone Encounter (Signed)
   Primary Cardiologist: Quay Burow, MD  Chart reviewed as part of pre-operative protocol coverage. Simple dental extractions are considered low risk procedures per guidelines and generally do not require any specific cardiac clearance. It is also generally accepted that for simple extractions and dental cleanings, there is no need to interrupt blood thinner therapy.   SBE prophylaxis is not required for the patient.  I will route this recommendation to the requesting party via Epic fax function and remove from pre-op pool.  Please call with questions.  Tami Lin Duke, PA 12/23/2018, 5:02 PM

## 2018-12-23 NOTE — Telephone Encounter (Signed)
I need clarification on clearance form to move forward.

## 2018-12-24 NOTE — Telephone Encounter (Signed)
Received call stating Dr. Danella Sensing office never received clearance via Epic. Per scheduler, Dr. Josph Macho is requesting clearance faxed to office manually.  Nurse faxed clearance as requested.

## 2018-12-24 NOTE — Telephone Encounter (Signed)
Follow up    Office is calling to advise they did not receive fax please resend to [phone].

## 2018-12-24 NOTE — Telephone Encounter (Signed)
Faxed to Dr [employer] office via [employer].

## 2019-01-20 ENCOUNTER — Encounter

## 2019-01-20 ENCOUNTER — Other Ambulatory Visit: Payer: Self-pay

## 2019-01-20 ENCOUNTER — Encounter: Payer: Self-pay | Admitting: Cardiovascular Disease

## 2019-01-20 ENCOUNTER — Ambulatory Visit (INDEPENDENT_AMBULATORY_CARE_PROVIDER_SITE_OTHER): Payer: Medicare HMO | Admitting: Cardiovascular Disease

## 2019-01-20 DIAGNOSIS — Z951 Presence of aortocoronary bypass graft: Secondary | ICD-10-CM | POA: Diagnosis not present

## 2019-01-20 DIAGNOSIS — I483 Typical atrial flutter: Secondary | ICD-10-CM

## 2019-01-20 DIAGNOSIS — E782 Mixed hyperlipidemia: Secondary | ICD-10-CM | POA: Diagnosis not present

## 2019-01-20 DIAGNOSIS — I10 Essential (primary) hypertension: Secondary | ICD-10-CM | POA: Diagnosis not present

## 2019-01-20 MED ORDER — AMIODARONE HCL 100 MG PO TABS
100.0000 mg | Freq: Every day | ORAL | 3 refills | Status: DC
Start: 2019-01-20 — End: 2019-05-26

## 2019-01-20 NOTE — Assessment & Plan Note (Signed)
History of postop atrial flutter on amiodarone and Eliquis.  He is in sinus rhythm today with right bundle branch block.  I am going to decrease his amiodarone from 2 to 100 mg a day.  I will have him see a midlevel provider back in 3 months me back in 6 months.  If in 3 months he is maintaining sinus rhythm and he has had his GI problem resolved will consider stopping his Eliquis and amiodarone completely.Marland Kitchen

## 2019-01-20 NOTE — Assessment & Plan Note (Signed)
History of essential hypertension with blood pressure measured today at 137/73.  He is on amlodipine and metoprolol.

## 2019-01-20 NOTE — Assessment & Plan Note (Signed)
History of CAD status post CABG x4 by Dr. Darcey Nora 04/01/2018 LIMA to the LAD, vein to ramus branch, obtuse marginal branch and PLA.  His postop course was complicated.  He is in the ICU for 11 days and transfer to the floor and ultimately to rehab.  He did develop a liver abscess and underwent percutaneous drainage and subsequent cholecystectomy by Dr. Barry Dienes which she is still having issues with.  He denies chest pain or shortness of breath.

## 2019-01-20 NOTE — Patient Instructions (Signed)
Medication Instructions:  Your physician has recommended you make the following change in your medication:   DECREASE YOUR AMIODARONE TO 100 MG BY MOUTH DAILY  If you need a refill on your cardiac medications before your next appointment, please call your pharmacy.   Lab work: NONE If you have labs (blood work) drawn today and your tests are completely normal, you will receive your results only by: Marland Kitchen MyChart Message (if you have MyChart) OR . A paper copy in the mail If you have any lab test that is abnormal or we need to change your treatment, we will call you to review the results.  Testing/Procedures: NONE  Follow-Up: At [facility], you and your health needs are our priority.  As part of our continuing mission to provide you with exceptional heart care, we have created designated Provider Care Teams.  These Care Teams include your primary Cardiologist (physician) and Advanced Practice Providers (APPs -  Physician Assistants and Nurse Practitioners) who all work together to provide you with the care you need, when you need it. . You will need a follow up appointment in 3 months with an APP and in 6 months with Dr. Quay Burow.  Please call our office 2 months in advance to schedule each appointment.  You may see one of the following Advanced Practice Providers on your designated Care Team:   . Kerin Ransom, PA-C . Daleen Snook Kroeger, PA-C . Sande Rives, PA-C ____________________ . Almyra Deforest, PA-C . Fabian Sharp, PA-C . Jory Sims, DNP . Rosaria Ferries, PA-C

## 2019-01-20 NOTE — Assessment & Plan Note (Signed)
History of hyperlipidemia not on statin therapy followed by his PCP 

## 2019-01-20 NOTE — Progress Notes (Signed)
01/20/2019 [name]   01-23-1943  [id number]  Primary Physician Karle Plumber, MD Primary Cardiologist: Runell Gess MD Nicholes Calamity, [place]  HPI:  [name] is a 73 y.o.  moderately overweight widowed Caucasian male father of 2, grandfather of 6 granddaughters referred by Dr. Hanley Hays for cardiac catheterization because of new onset exertional chest pain and an abnormal Myoview stress test.I last saw him in the office  07/15/2018.His risk factors include treated hypertension, diabetes and hyperlipidemia. He is never had a heart attack or stroke. There is no family history of heart disease. 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 He smoked remotely and stopped in 1982. He is fairly active and golfs several times a week. He gets chest pain after the first hole. Recent 2D echo was essentially normal and a Myoview stress test did show inferior nontransmural scar with moderate lateral ischemic changes.   He underwent outpatient radial diagnostic cath by myself on 02/27/2018 revealing significant two-vessel disease and a left dominant system in the LAD and circumflex. I referred him to Dr. Maren Beach who ultimately performed CABG x4 on 04/01/2018 with a LIMA to the LAD, vein to a ramus branch, obtuse marginal branch and PLA. Was delayed 2 weeks because of dental issue that needed to be addressed. His postop course was complicated. He was in the ICU for 11 days then transferred to the floor and ultimately to rehab. He was readmitted to the hospital with a liver abscess which underwent percutaneous drainage.   Since I saw him 6 months ago he has since had a cholecystectomy.  He had his liver abscess drainage tube removed and then replaced.  He apparently still has a communication between his liver and his duodenum.  His EKG today shows sinus rhythm with right bundle branch block.    Current Meds  Medication Sig  . acetaminophen (TYLENOL) 500  MG tablet Take 1,000 mg by mouth daily as needed for moderate pain.  Marland Kitchen amiodarone (PACERONE) 200 MG tablet Take 200 mg by mouth daily.  Marland Kitchen amLODipine (NORVASC) 5 MG tablet Take 5 mg by mouth daily.  Marland Kitchen aspirin EC 81 MG tablet Take 81 mg by mouth daily.  . Cholecalciferol (VITAMIN D3) 2000 units TABS Take 2,000 Units by mouth 2 (two) times daily.   Marland Kitchen ELIQUIS 5 MG TABS tablet TAKE ONE TABLET BY MOUTH TWICE DAILY  . Ferrous Sulfate (IRON PO) Take 1 tablet by mouth 2 (two) times daily.  . finasteride (PROSCAR) 5 MG tablet Take 5 mg by mouth daily.  Marland Kitchen loratadine (CLARITIN) 10 MG tablet Take 10 mg by mouth daily.  . metFORMIN (GLUCOPHAGE) 1000 MG tablet Take 1,000 mg by mouth 2 (two) times daily.  . metoprolol tartrate (LOPRESSOR) 25 MG tablet Take 1 tablet (25 mg total) by mouth 2 (two) times daily.  . Multiple Vitamins-Minerals (MULTIVITAMIN ADULT PO) Take 1 tablet by mouth daily.  Marland Kitchen oxyCODONE (OXY IR/ROXICODONE) 5 MG immediate release tablet Take 1 tablet (5 mg total) by mouth every 6 (six) hours as needed for severe pain.     Allergies  Allergen Reactions  . Ciprofloxacin Rash  . Erythromycin Ethylsuccinate Rash    Social History   Socioeconomic History  . Marital status: Widowed    Spouse name: Not on file  . Number of children: 2  . Years of education: Not on file  . Highest education level: Not on file  Occupational History  . Occupation:  retired  Engineer, production  . Financial resource strain: Not on file  . Food insecurity    Worry: Not on file    Inability: Not on file  . Transportation needs    Medical: Not on file    Non-medical: Not on file  Tobacco Use  . Smoking status: Former Smoker    Types: Cigarettes    Quit date: 1982    Years since quitting: 38.7  . Smokeless tobacco: Never Used  . Tobacco comment: quit 1982  Substance and Sexual Activity  . Alcohol use: Not Currently  . Drug use: Never  . Sexual activity: Not on file  Lifestyle  . Physical activity    Days  per week: Not on file    Minutes per session: Not on file  . Stress: Not on file  Relationships  . Social Musician on phone: Not on file    Gets together: Not on file    Attends religious service: Not on file    Active member of club or organization: Not on file    Attends meetings of clubs or organizations: Not on file    Relationship status: Not on file  . Intimate partner violence    Fear of current or ex partner: Not on file    Emotionally abused: Not on file    Physically abused: Not on file    Forced sexual activity: Not on file  Other Topics Concern  . 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     Review of Systems: General: negative for chills, fever, night sweats or weight changes.  Cardiovascular: negative for chest pain, dyspnea on exertion, edema, orthopnea, palpitations, paroxysmal nocturnal dyspnea or shortness of breath Dermatological: negative for rash Respiratory: negative for cough or wheezing Urologic: negative for hematuria Abdominal: negative for nausea, vomiting, diarrhea, bright red blood per rectum, melena, or hematemesis Neurologic: negative for visual changes, syncope, or dizziness All other systems reviewed and are otherwise negative except as noted above.    Blood pressure 137/73, pulse 63, height 5\' 11"  (1.803 m), weight 243 lb 12.8 oz (110.6 kg), SpO2 99 %.  General appearance: alert and no distress Neck: no adenopathy, no carotid bruit, no JVD, supple, symmetrical, trachea midline and thyroid not enlarged, symmetric, no tenderness/mass/nodules Lungs: clear to auscultation bilaterally Heart: regular rate and rhythm, S1, S2 normal, no murmur, click, rub or gallop Extremities: extremities normal, atraumatic, no cyanosis or edema Pulses: 2+ and symmetric Skin: Skin color, texture, turgor normal. No rashes or lesions Neurologic: Alert and oriented X 3, normal strength and tone. Normal  symmetric reflexes. Normal coordination and gait  EKG sinus rhythm at a rate of 61 with right bundle branch block, and inferior Q waves.  I personally reviewed this EKG.  ASSESSMENT AND PLAN:   Essential hypertension History of essential hypertension with blood pressure measured today at 137/73.  He is on amlodipine and metoprolol.  Hyperlipidemia History of hyperlipidemia not on statin therapy followed by his PCP  Hx of CABG History of CAD status post CABG x4 by Dr. 04/01/2018 LIMA to the LAD, vein to ramus branch, obtuse marginal branch and PLA.  His postop course was complicated.  He is in the ICU for 11 days and transfer to the floor and ultimately to rehab.  He did develop a liver abscess and underwent percutaneous drainage and subsequent cholecystectomy by Dr. 14/06/2017 which she is still having  issues with.  He denies chest pain or shortness of breath.      Lorretta Harp MD FACP,FACC,FAHA, [facility] 01/20/2019 4:55 PM

## 2019-02-03 ENCOUNTER — Other Ambulatory Visit (HOSPITAL_COMMUNITY): Payer: Self-pay | Admitting: Interventional Radiology

## 2019-02-03 DIAGNOSIS — K8012 Calculus of gallbladder with acute and chronic cholecystitis without obstruction: Secondary | ICD-10-CM

## 2019-02-06 ENCOUNTER — Other Ambulatory Visit (HOSPITAL_COMMUNITY): Payer: Self-pay | Admitting: Interventional Radiology

## 2019-02-06 ENCOUNTER — Other Ambulatory Visit: Payer: Self-pay

## 2019-02-06 ENCOUNTER — Encounter (HOSPITAL_COMMUNITY): Payer: Self-pay | Admitting: Interventional Radiology

## 2019-02-06 ENCOUNTER — Ambulatory Visit (HOSPITAL_COMMUNITY)
Admission: RE | Admit: 2019-02-06 | Discharge: 2019-02-06 | Disposition: A | Discharge status: Home / Self Care | Payer: Medicare HMO | Source: Ambulatory Visit | Attending: Interventional Radiology | Admitting: Interventional Radiology

## 2019-02-06 ENCOUNTER — Encounter

## 2019-02-06 DIAGNOSIS — Z4803 Encounter for change or removal of drains: Secondary | ICD-10-CM | POA: Insufficient documentation

## 2019-02-06 DIAGNOSIS — K8012 Calculus of gallbladder with acute and chronic cholecystitis without obstruction: Secondary | ICD-10-CM

## 2019-02-06 HISTORY — PX: IR CATHETER TUBE CHANGE: IMG717

## 2019-02-06 MED ORDER — LIDOCAINE HCL 1 % IJ SOLN
INTRAMUSCULAR | Status: DC | PRN
Start: 2019-02-06 — End: 2019-02-07
  Administered 2019-02-06: 5 mL

## 2019-02-06 MED ORDER — LIDOCAINE HCL 1 % IJ SOLN
INTRAMUSCULAR | Status: DC
Start: 2019-02-06 — End: 2019-02-07
  Filled 2019-02-06: qty 20

## 2019-02-06 MED ORDER — IOHEXOL 300 MG/ML  SOLN
50.0000 mL | Freq: Once | INTRAMUSCULAR | Status: AC | PRN
Start: 2019-02-06 — End: 2019-02-06
  Administered 2019-02-06: 10 mL

## 2019-02-06 NOTE — Procedures (Signed)
Interventional Radiology Procedure Note  Procedure: Image guided exchange of existing 55F drainage catheter, with sinogram.  Findings:  The cavity at the drain site appears to have a broad direct connection to the first portion of the duodenum on the anti-mesenteric border.  .  Complications: None  Recommendations:  - To gravity - Do not submerge - Routine drain care   Signed,  Dulcy Fanny. Earleen Newport, DO

## 2019-03-02 ENCOUNTER — Other Ambulatory Visit: Payer: Self-pay | Admitting: Cardiovascular Disease

## 2019-03-09 ENCOUNTER — Other Ambulatory Visit: Payer: Self-pay | Admitting: General Surgery

## 2019-03-11 ENCOUNTER — Encounter

## 2019-03-12 ENCOUNTER — Other Ambulatory Visit: Payer: Self-pay | Admitting: General Surgery

## 2019-03-12 DIAGNOSIS — K316 Fistula of stomach and duodenum: Secondary | ICD-10-CM

## 2019-03-30 ENCOUNTER — Encounter

## 2019-03-30 ENCOUNTER — Ambulatory Visit
Admission: RE | Admit: 2019-03-30 | Discharge: 2019-03-30 | Disposition: A | Discharge status: Home / Self Care | Payer: Medicare HMO | Source: Ambulatory Visit | Attending: General Surgery | Admitting: General Surgery

## 2019-03-30 DIAGNOSIS — K316 Fistula of stomach and duodenum: Secondary | ICD-10-CM

## 2019-03-30 MED ORDER — IOPAMIDOL (ISOVUE-300) INJECTION 61%
125.0000 mL | Freq: Once | INTRAVENOUS | Status: AC | PRN
Start: 2019-03-30 — End: 2019-03-30
  Administered 2019-03-30: 125 mL via INTRAVENOUS

## 2019-03-31 NOTE — Progress Notes (Signed)
[facility] - [place], [place] - [address] [place] [id number] Phone: [phone] Fax: [phone]      Your procedure is scheduled on December 8th.  Report to [facility] Main [facility]tr[facility] "A" at 10:30 A.M., and check in at the Admitting office.  Call this number if you have problems the morning of surgery:  [phone]   Call [phone] if you have any questions prior to your surgery date Monday-Friday 8am-4pm    Remember:  Do not eat after midnight the night before your surgery  You may drink clear liquids until 9:30 AM the morning of your surgery.   Clear liquids allowed are: Water, Non-Citrus Juices (without pulp), Carbonated Beverages, Clear Tea, Black Coffee Only, and Gatorade   Enhanced Recovery after Surgery for Orthopedics Enhanced Recovery after Surgery is a protocol used to improve the stress on your body and your recovery after surgery.  Patient Instructions   The night before surgery:  o No food after midnight. ONLY clear liquids after midnight o Finish your G2 by 9:30 AM the morning of surgery.    The day of surgery (if you have diabetes): o  o Drink ONE (1) Gatorade 2 (G2) as directed two nights before surgery. Drink (1) G2 the morning of surgery. o This drink was given to you during your hospital  pre-op appointment visit.  o The pre-op nurse will instruct you on the time to drink the   Gatorade 2 (G2) depending on your surgery time. o Color of the Gatorade may vary. Red is not allowed. o Nothing else to drink after completing the  Gatorade 2 (G2).         If you have questions, please contact your surgeons office.     Take these medicines the morning of surgery with A SIP OF WATER   Tylenol - if needed  Amiodarone  Amlodipine (Norvasc)  Metoprolol (lopressor)  Oxycodone - if needed  Follow your surgeon's instructions on when to stop Eliquis & Aspirin.  If no instructions were given by your surgeon then  you will need to call the office to get those instructions.    7 days prior to surgery STOP taking any Aleve, Naproxen, Ibuprofen, Motrin, Advil, Goody's, BC's, all herbal medications, fish oil, and all vitamins.   WHAT DO I DO ABOUT MY DIABETES MEDICATION?    Do not take oral diabetes medicines (pills) the morning of surgery. - Metformin    HOW TO MANAGE YOUR DIABETES BEFORE AND AFTER SURGERY  Why is it important to control my blood sugar before and after surgery?  Improving blood sugar levels before and after surgery helps healing and can limit problems.  A way of improving blood sugar control is eating a healthy diet by: o  Eating less sugar and carbohydrates o  Increasing activity/exercise o  Talking with your doctor about reaching your blood sugar goals  High blood sugars (greater than 180 mg/dL) can raise your risk of infections and slow your recovery, so you will need to focus on controlling your diabetes during the weeks before surgery.  Make sure that the doctor who takes care of your diabetes knows about your planned surgery including the date and location.  How do I manage my blood sugar before surgery?  Check your blood sugar at least 4 times a day, starting 2 days before surgery, to make sure that the level is not too high or low.  Check your blood sugar the  morning of your surgery when you wake up and every 2 hours until you get to the Short Stay unit. o If your blood sugar is less than 70 mg/dL, you will need to treat for low blood sugar: - Do not take insulin. - Treat a low blood sugar (less than 70 mg/dL) with  cup of clear juice (cranberry or apple), 4 glucose tablets, OR glucose gel. - Recheck blood sugar in 15 minutes after treatment (to make sure it is greater than 70 mg/dL). If your blood sugar is not greater than 70 mg/dL on recheck, call [phone] for further instructions.  Report your blood sugar to the short stay nurse when you get to Short  Stay.   If you are admitted to the hospital after surgery: o Your blood sugar will be checked by the staff and you will probably be given insulin after surgery (instead of oral diabetes medicines) to make sure you have good blood sugar levels. o The goal for blood sugar control after surgery is 80-180 mg/dL.    The Morning of Surgery  Do not wear jewelry.  Do not wear lotions, powders, colognes, or deodorant  Men may shave face and neck.  Do not bring valuables to the hospital.  [facility] is not responsible for any belongings or valuables.  If you are a smoker, DO NOT Smoke 24 hours prior to surgery  If you wear a CPAP at night please bring your mask, tubing, and machine the morning of surgery   Remember that you must have someone to transport you home after your surgery, and remain with you for 24 hours if you are discharged the same day.   Please bring cases for contacts, glasses, hearing aids, dentures or bridgework because it cannot be worn into surgery.    Leave your suitcase in the car.  After surgery it may be brought to your room.  For patients admitted to the hospital, discharge time will be determined by your treatment team.  Patients discharged the day of surgery will not be allowed to drive home.    Special instructions:   [place]- Preparing For Surgery  Before surgery, you can play an important role. Because skin is not sterile, your skin needs to be as free of germs as possible. You can reduce the number of germs on your skin by washing with CHG (chlorahexidine gluconate) Soap before surgery.  CHG is an antiseptic cleaner which kills germs and bonds with the skin to continue killing germs even after washing.    Oral Hygiene is also important to reduce your risk of infection.  Remember - BRUSH YOUR TEETH THE MORNING OF SURGERY WITH YOUR REGULAR TOOTHPASTE  Please do not use if you have an allergy to CHG or antibacterial soaps. If your skin becomes  reddened/irritated stop using the CHG.  Do not shave (including legs and underarms) for at least 48 hours prior to first CHG shower. It is OK to shave your face.  Please follow these instructions carefully.   1. Shower the NIGHT BEFORE SURGERY and the MORNING OF SURGERY with CHG Soap.   2. If you chose to wash your hair, wash your hair first as usual with your normal shampoo.  3. After you shampoo, rinse your hair and body thoroughly to remove the shampoo.  4. Use CHG as you would any other liquid soap. You can apply CHG directly to the skin and wash gently with a scrungie or a clean washcloth.   5. Apply  the CHG Soap to your body ONLY FROM THE NECK DOWN.  Do not use on open wounds or open sores. Avoid contact with your eyes, ears, mouth and genitals (private parts). Wash Face and genitals (private parts)  with your normal soap.   6. Wash thoroughly, paying special attention to the area where your surgery will be performed.  7. Thoroughly rinse your body with warm water from the neck down.  8. DO NOT shower/wash with your normal soap after using and rinsing off the CHG Soap.  9. Pat yourself dry with a CLEAN TOWEL.  10. Wear CLEAN PAJAMAS to bed the night before surgery, wear comfortable clothes the morning of surgery  11. Place CLEAN SHEETS on your bed the night of your first shower and DO NOT SLEEP WITH PETS.    Day of Surgery:  Please shower the morning of surgery with the CHG soap Do not apply any deodorants/lotions. Please wear clean clothes to the hospital/surgery center.   Remember to brush your teeth WITH YOUR REGULAR TOOTHPASTE.   Please read over the following fact sheets that you were given.

## 2019-04-01 ENCOUNTER — Encounter (HOSPITAL_COMMUNITY): Payer: Self-pay

## 2019-04-01 ENCOUNTER — Other Ambulatory Visit: Payer: Self-pay

## 2019-04-01 ENCOUNTER — Encounter

## 2019-04-01 ENCOUNTER — Encounter (HOSPITAL_COMMUNITY)
Admission: RE | Admit: 2019-04-01 | Discharge: 2019-04-01 | Disposition: A | Discharge status: Home / Self Care | Payer: Medicare HMO | Source: Ambulatory Visit | Attending: General Surgery | Admitting: General Surgery

## 2019-04-01 DIAGNOSIS — Z79899 Other long term (current) drug therapy: Secondary | ICD-10-CM | POA: Insufficient documentation

## 2019-04-01 DIAGNOSIS — E785 Hyperlipidemia, unspecified: Secondary | ICD-10-CM | POA: Diagnosis not present

## 2019-04-01 DIAGNOSIS — E669 Obesity, unspecified: Secondary | ICD-10-CM | POA: Insufficient documentation

## 2019-04-01 DIAGNOSIS — M109 Gout, unspecified: Secondary | ICD-10-CM | POA: Insufficient documentation

## 2019-04-01 DIAGNOSIS — K316 Fistula of stomach and duodenum: Secondary | ICD-10-CM | POA: Insufficient documentation

## 2019-04-01 DIAGNOSIS — Z7982 Long term (current) use of aspirin: Secondary | ICD-10-CM | POA: Insufficient documentation

## 2019-04-01 DIAGNOSIS — D649 Anemia, unspecified: Secondary | ICD-10-CM | POA: Insufficient documentation

## 2019-04-01 DIAGNOSIS — Z7984 Long term (current) use of oral hypoglycemic drugs: Secondary | ICD-10-CM | POA: Insufficient documentation

## 2019-04-01 DIAGNOSIS — Z7901 Long term (current) use of anticoagulants: Secondary | ICD-10-CM | POA: Insufficient documentation

## 2019-04-01 DIAGNOSIS — I2510 Atherosclerotic heart disease of native coronary artery without angina pectoris: Secondary | ICD-10-CM | POA: Insufficient documentation

## 2019-04-01 DIAGNOSIS — Z951 Presence of aortocoronary bypass graft: Secondary | ICD-10-CM | POA: Insufficient documentation

## 2019-04-01 DIAGNOSIS — I10 Essential (primary) hypertension: Secondary | ICD-10-CM | POA: Diagnosis not present

## 2019-04-01 DIAGNOSIS — Z6834 Body mass index (BMI) 34.0-34.9, adult: Secondary | ICD-10-CM | POA: Diagnosis not present

## 2019-04-01 DIAGNOSIS — Z87891 Personal history of nicotine dependence: Secondary | ICD-10-CM | POA: Insufficient documentation

## 2019-04-01 DIAGNOSIS — Z01812 Encounter for preprocedural laboratory examination: Secondary | ICD-10-CM | POA: Diagnosis present

## 2019-04-01 HISTORY — DX: Anemia, unspecified: D64.9

## 2019-04-01 LAB — COMPREHENSIVE METABOLIC PANEL
ALT: 23 U/L (ref 0–44)
AST: 27 U/L (ref 15–41)
Albumin: 3.8 g/dL (ref 3.5–5.0)
Alkaline Phosphatase: 63 U/L (ref 38–126)
Anion gap: 13 (ref 5–15)
BUN: 14 mg/dL (ref 8–23)
CO2: 20 mmol/L — ABNORMAL LOW (ref 22–32)
Calcium: 9.3 mg/dL (ref 8.9–10.3)
Chloride: 105 mmol/L (ref 98–111)
Creatinine, Ser: 1.44 mg/dL — ABNORMAL HIGH (ref 0.61–1.24)
GFR calc Af Amer: 54 mL/min — ABNORMAL LOW (ref >60)
GFR calc non Af Amer: 47 mL/min — ABNORMAL LOW (ref >60)
Glucose, Bld: 98 mg/dL (ref 70–99)
Potassium: 4.4 mmol/L (ref 3.5–5.1)
Sodium: 138 mmol/L (ref 135–145)
Total Bilirubin: 0.3 mg/dL (ref 0.3–1.2)
Total Protein: 6.8 g/dL (ref 6.5–8.1)

## 2019-04-01 LAB — CBC WITH DIFFERENTIAL/PLATELET
Abs Immature Granulocytes: 0.09 K/uL — ABNORMAL HIGH (ref 0.00–0.07)
Basophils Absolute: 0.1 K/uL (ref 0.0–0.1)
Basophils Relative: 1 %
Eosinophils Absolute: 0.3 K/uL (ref 0.0–0.5)
Eosinophils Relative: 3 %
HCT: 41.8 % (ref 39.0–52.0)
Hemoglobin: 13.5 g/dL (ref 13.0–17.0)
Immature Granulocytes: 1 %
Lymphocytes Relative: 23 %
Lymphs Abs: 2.3 K/uL (ref 0.7–4.0)
MCH: 28.8 pg (ref 26.0–34.0)
MCHC: 32.3 g/dL (ref 30.0–36.0)
MCV: 89.1 fL (ref 80.0–100.0)
Monocytes Absolute: 0.9 K/uL (ref 0.1–1.0)
Monocytes Relative: 9 %
Neutro Abs: 6.4 K/uL (ref 1.7–7.7)
Neutrophils Relative %: 63 %
Platelets: 250 K/uL (ref 150–400)
RBC: 4.69 MIL/uL (ref 4.22–5.81)
RDW: 17.7 % — ABNORMAL HIGH (ref 11.5–15.5)
WBC: 10.1 K/uL (ref 4.0–10.5)
nRBC: 0 % (ref 0.0–0.2)

## 2019-04-01 LAB — URINALYSIS, ROUTINE W REFLEX MICROSCOPIC
Bilirubin Urine: NEGATIVE
Glucose, UA: NEGATIVE mg/dL
Hgb urine dipstick: NEGATIVE
Ketones, ur: NEGATIVE mg/dL
Leukocytes,Ua: NEGATIVE
Nitrite: NEGATIVE
Protein, ur: NEGATIVE mg/dL
Specific Gravity, Urine: 1.026 (ref 1.005–1.030)
pH: 5 (ref 5.0–8.0)

## 2019-04-01 LAB — TYPE AND SCREEN
ABO/RH(D): O POS
Antibody Screen: NEGATIVE

## 2019-04-01 LAB — HEMOGLOBIN A1C
Hgb A1c MFr Bld: 6 % — ABNORMAL HIGH (ref 4.8–5.6)
Mean Plasma Glucose: 125.5 mg/dL

## 2019-04-01 LAB — GLUCOSE, CAPILLARY: Glucose-Capillary: 123 mg/dL — ABNORMAL HIGH (ref 70–99)

## 2019-04-01 NOTE — Progress Notes (Signed)
PCP - Moogali Arvind Cardiologist - Dr. Gwenlyn Found  Chest x-ray - 06-04-18 EKG - 01-20-19 ECHO - 02-21-18 Cardiac Cath - 02-27-18  SA - yes, pt stated he does not wear CPAP  DM - Type 2 Fasting Blood Sugar - 90-100  Blood Thinner Instructions: pt instructed to stop Eliquis on Saturday, Dec. 5th. Aspirin Instructions: follow surgeon's instructions  ERAS Protcol -yes, G2 given for (2) nights before surgery and for morning of surgery (total of 2)  COVID TEST- Friday, Dec. 4th   Anesthesia review: yes, heart history  Patient denies shortness of breath, fever, cough and chest pain at PAT appointment   All instructions explained to the patient, with a verbal understanding of the material. Patient agrees to go over the instructions while at home for a better understanding. Patient also instructed to self quarantine after being tested for COVID-19. The opportunity to ask questions was provided.

## 2019-04-02 ENCOUNTER — Encounter (HOSPITAL_COMMUNITY): Payer: Self-pay

## 2019-04-02 ENCOUNTER — Encounter

## 2019-04-02 NOTE — Progress Notes (Signed)
Anesthesia Chart Review:  Case: [id number] Date/Time: 04/07/19 1215   Procedure: REPAIR OF DUODENAL FISTULA (N/A )   Anesthesia type: General   Pre-op diagnosis: DUODENAL FISTULA   [facility]: [facility] ROOM 02 / [facility] OR   Surgeon: [name], MD      DISCUSSION: Patient is a 73 year old male scheduled for the above procedure.  History includes former smoker (quit 1982), CAD (s/p CABG: LIMA-LAD, SVG-RI, SVG-OM, SVG-dCX/PL 04/01/18; post-operative course complicated by AKI, afib/flutter, anemia requiring transfusion), complicated cholecystitis/liver abscess (05/09/18, s/p percutaneous drainage 05/11/18; s/p cholecystectomy 6//9/20, complicated by post-operative abscess, s/p percutaneous drain 11/01/18, last exchange seen 02/06/19; previous percutaneous drain for liver abscess 08/2009), HTN, HLD, DM2, exertional dyspnea (02/2018). BMI is consistent with obesity.  Last seen by cardiologist Dr. Allyson Sabal 12/2018 and was maintaining SR. Denied chest pain and SOB.  Patient reported instructions to hold Eliquis starting 04/04/19. Presurgical COVID-19 test is scheduled for 04/03/19.  If negative and otherwise no acute changes and I would anticipate that he could proceed as planned.  He is scheduled for PT/INR on the day of surgery.   VS: BP (!) 157/88   Pulse 62   Temp 37 C (Oral)   Resp 18   Ht 5\' 11"  (1.803 m)   Wt 112.3 kg   SpO2 99%   BMI 34.52 kg/m    PROVIDERS: , MD is PCP  Karle Plumber, MD is cardiologist. Last visit 01/20/19. Six month follow-up planned and if maintaining SR and GI problem resolved then would consider stopping Eliqius and amiodarone completely. Next visit 05/20/19.   LABS: Labs reviewed: Acceptable for surgery. Cr 1.44, consistent with prior results since mid July 2020.   (all labs ordered are listed, but only abnormal results are displayed)  Labs Reviewed  GLUCOSE, CAPILLARY - Abnormal; Notable for the following components:      Result Value   Glucose-Capillary  123 (*)    All other components within normal limits  CBC WITH DIFFERENTIAL/PLATELET - Abnormal; Notable for the following components:   RDW 17.7 (*)    Abs Immature Granulocytes 0.09 (*)    All other components within normal limits  COMPREHENSIVE METABOLIC PANEL - Abnormal; Notable for the following components:   CO2 20 (*)    Creatinine, Ser 1.44 (*)    GFR calc non Af Amer 47 (*)    GFR calc Af Amer 54 (*)    All other components within normal limits  URINALYSIS, ROUTINE W REFLEX MICROSCOPIC - Abnormal; Notable for the following components:   APPearance HAZY (*)    All other components within normal limits  HEMOGLOBIN A1C - Abnormal; Notable for the following components:   Hgb A1c MFr Bld 6.0 (*)    All other components within normal limits  TYPE AND SCREEN     IMAGES: CT abd/pelvis 03/30/19 (ordered by Dr 04/01/19): IMPRESSION: 1. Prior cholecystectomy, with percutaneous drainage catheter in gallbladder fossa. No evidence of residual abscess. 2. 1.3 cm right renal lesion with evidence of contrast enhancement, suspicious for small renal cell carcinoma. Abdomen MRI without and with contrast recommended for further characterization. 3. Colonic diverticulosis. No radiographic evidence of diverticulitis. 4. Tiny benign left adrenal myelolipoma. Aortic Atherosclerosis (ICD10-I70.0).   EKG: 01/20/19 ([facility]): Sinus rhythm with wide QRS rate of 61 bpm Right bundle branch block Inferior infarct, age undetermined   CV: Intra-op TEE for CABG 03/31/18: Result status: Final result   Left ventricle: Normal LV cavity size. Concentric LVH of mild severity. Normal LV  systolic function with EF 55-60%. No obvious wall motion abnormalities.   Left atrium: Cavity is mildly to moderately dilated.  Aortic valve: Mild valve thickening present.  Mitral valve: Trace regurgitation.  Right ventricle: Normal cavity size, wall thickness and ejection fraction.      TTE 02/21/18  [facility]): Conclusions: 1.  There is mild concentric LVH. 2.  Overall left ventricular systolic function is normal with EF between 55 and 60%. 3.  The left atrium is moderately dilated. 4.  There is mild aortic valve sclerosis without stenosis. 5.  Other than LAE normal cardiac chamber sizes and function; normal valve function; no pericardial effusion or intracardiac mass.  No intracardiac shunts by 2D and color-flow imaging.  Normal thoracic aorta and aortic arch.   Carotid US 03/14/18: Summary: - Right Carotid: Velocities in the right ICA are consistent with a 1-39% stenosis. - Left Carotid: Velocities in the left ICA are consistent with a 1-39% stenosis. Vertebrals: Bilateral vertebral arteries demonstrate antegrade flow.   Last LHC was 02/27/18 prior to CABG.   Past Medical History:  Diagnosis Date  . Allergies   . Anemia   . Anxiety   . CAD (coronary artery disease)   . Diabetes ([place])   . Dyspnea    upon exertion  . Gout   . Hepatitis    at 73 years old  . Hyperlipemia   . Hypertension     Past Surgical History:  Procedure Laterality Date  . CHOLECYSTECTOMY N/A 10/07/2018   Procedure: Laparoscopic Cholecystectomy;  Surgeon: [name], MD;  Location: [place];  Service: General;  Laterality: N/A;  . CORONARY ARTERY BYPASS GRAFT N/A 03/31/2018   Procedure: CORONARY ARTERY BYPASS GRAFTING (CABG) TIMES FOUR USING LEFT INTERNAL MAMMARY ARTERY AND RIGHT GREATER SAPHENOUS VEIN HARVESTED ENDOSCOPICALLY.;  Surgeon: Ivin Poot, MD;  Location: [place];  Service: Open Heart Surgery;  Laterality: N/A;  . IR CATHETER TUBE CHANGE  12/09/2018  . IR CATHETER TUBE CHANGE  02/06/2019  . IR EXCHANGE BILIARY DRAIN  06/27/2018  . IR PERC CHOLECYSTOSTOMY  05/11/2018  . IR RADIOLOGIST EVAL & MGMT  06/11/2018  . IR RADIOLOGIST EVAL & MGMT  11/18/2018  . IR RADIOLOGIST EVAL & MGMT  12/03/2018  . LEFT HEART CATH AND CORONARY ANGIOGRAPHY N/A 02/27/2018   Procedure: LEFT HEART CATH  AND CORONARY ANGIOGRAPHY;  Surgeon: Lorretta Harp, MD;  Location: [place] [facility];  Service: Cardiovascular;  Laterality: N/A;  . liver absess  2010  . TEE WITHOUT CARDIOVERSION N/A 03/31/2018   Procedure: TRANSESOPHAGEAL ECHOCARDIOGRAM (TEE);  Surgeon: Prescott Gum, Collier Salina, MD;  Location: [place];  Service: Open Heart Surgery;  Laterality: N/A;    MEDICATIONS: . acetaminophen (TYLENOL) 500 MG tablet  . amiodarone (PACERONE) 100 MG tablet  . amiodarone (PACERONE) 200 MG tablet  . amLODipine (NORVASC) 5 MG tablet  . aspirin EC 81 MG tablet  . cholecalciferol (VITAMIN D) 25 MCG (1000 UT) tablet  . ELIQUIS 5 MG TABS tablet  . Ferrous Sulfate (IRON PO)  . finasteride (PROSCAR) 5 MG tablet  . loratadine (CLARITIN) 10 MG tablet  . metFORMIN (GLUCOPHAGE) 1000 MG tablet  . metoprolol tartrate (LOPRESSOR) 25 MG tablet  . Multiple Vitamins-Minerals (MULTIVITAMIN ADULT PO)  . oxyCODONE (OXY IR/ROXICODONE) 5 MG immediate release tablet   No current facility-administered medications for this encounter.     Myra Gianotti, PA-C Surgical Short Stay/Anesthesiology [facility] Phone [phone] [facility] Phone [phone] 04/02/2019 3:06 PM

## 2019-04-02 NOTE — Anesthesia Preprocedure Evaluation (Addendum)
Anesthesia Evaluation  Patient identified by MRN, date of birth, ID band Patient awake    Reviewed: Allergy & Precautions, NPO status , Patient's Chart, lab work & pertinent test results, reviewed documented beta blocker date and time   Airway Mallampati: II  TM Distance: >3 FB Neck ROM: Full    Dental  (+) Teeth Intact, Dental Advisory Given   Pulmonary shortness of breath, former smoker,    Pulmonary exam normal breath sounds clear to auscultation       Cardiovascular hypertension, Pt. on medications and Pt. on home beta blockers + CAD and + CABG  Normal cardiovascular exam+ dysrhythmias  Rhythm:Regular Rate:Normal     Neuro/Psych PSYCHIATRIC DISORDERS Anxiety negative neurological ROS     GI/Hepatic Neg liver ROS,  DUODENAL FISTULA   Endo/Other  diabetes, Well Controlled, Type 2, Oral Hypoglycemic AgentsObesity   Renal/GU Renal InsufficiencyRenal disease     Musculoskeletal negative musculoskeletal ROS (+)   Abdominal   Peds  Hematology  (+) Blood dyscrasia (Eliquis), ,   Anesthesia Other Findings Day of surgery medications reviewed with the patient.  Reproductive/Obstetrics                          Anesthesia Physical Anesthesia Plan  ASA: III  Anesthesia Plan: General   Post-op Pain Management:    Induction: Intravenous  PONV Risk Score and Plan: 2 and Dexamethasone and Ondansetron  Airway Management Planned: Oral ETT  Additional Equipment:   Intra-op Plan:   Post-operative Plan: Extubation in OR  Informed Consent: I have reviewed the patients History and Physical, chart, labs and discussed the procedure including the risks, benefits and alternatives for the proposed anesthesia with the patient or authorized representative who has indicated his/her understanding and acceptance.     Dental advisory given  Plan Discussed with: CRNA  Anesthesia Plan Comments: (PAT note  written by Myra Gianotti, PA-C. )      Anesthesia Quick Evaluation

## 2019-04-03 ENCOUNTER — Encounter

## 2019-04-03 ENCOUNTER — Other Ambulatory Visit (HOSPITAL_COMMUNITY)
Admission: RE | Admit: 2019-04-03 | Discharge: 2019-04-03 | Disposition: A | Discharge status: Home / Self Care | Payer: Medicare HMO | Source: Ambulatory Visit | Attending: General Surgery | Admitting: General Surgery

## 2019-04-03 DIAGNOSIS — Z01812 Encounter for preprocedural laboratory examination: Secondary | ICD-10-CM | POA: Diagnosis present

## 2019-04-03 DIAGNOSIS — Z20828 Contact with and (suspected) exposure to other viral communicable diseases: Secondary | ICD-10-CM | POA: Diagnosis not present

## 2019-04-04 LAB — NOVEL CORONAVIRUS, NAA (HOSP ORDER, SEND-OUT TO REF LAB; TAT 18-24 HRS): SARS-CoV-2, NAA: NOT DETECTED

## 2019-04-06 NOTE — H&P (Signed)
[name] Documented: 03/09/2019 1:35 PM Location: [facility] Patient #: [id number] DOB: 09/09/1942 Single / Language: [name] / Race: White Male   History of Present Illness Calvin Klein MD; 03/23/2019 5:40 PM) The patient is a 73 year old male who presents with an unspecified infection. Patient is a 73 year old male who our service saw in the hospital for cholecystitis and adjacent liver abscess. He received a percutaneous cholecystostomy tube 1/12. Patient's history is notable for having a cardiac cath in the fall demonstrating 4 blockages. He was scheduled for a CABG and then was found to have a tooth abscess. He eventually had CABG x4 on December 2. His postop course was complicated by atrial fibrillation and by aspiration pneumonia. He went home for short period of time but came back quite ill. He was seen to have a large liver abscess as well as an inflamed appearing gallbladder with a thickened gallbladder wall. Neoplasm was not excluded. He required IV antibiotics via PICC line at home for a while after discharge. He has been seen by infectious disease and just got his PICC line and IV antibiotics discontinued. Also of note, he was admitted to [facility] around 8 years ago with significant gallbladder infection as well. MR was performed and did not show any suspicion for gallbladder mass He got perc drain capped. He was set up for lap chole in march 2020, but it got delayed until june due to Iron. He required readmission for gallbladder fossa abscess and got perc drain. Since then he has been readmitted several times and has had to go to the ED for dehydration once. 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   His drain output came down quite a bit and he stopped flushing it. He was doing well. He accidentally cut through it so we were doing a trial of seeing what happened just dressing the drain terminus, but he started having lots of drainage  around the tube so it was switched out for a new drain 12/09/2018. At first, it put out over 100 ml/day for several days, but for the last 1-2 weeks it was 20-25 ml/day. It still is murky. He denies fever/chills. He has had another dental procedure and is back on amoxicillin. The amoxicillin gives him some diarrhea.   Pt is continuing to do better from a nutrition standpoint. His energy level is back up. He is still having 50-100 mL drain output per day.     Allergies [name], CMA; 03/09/2019 1:41 PM) Cipro *FLUOROQUINOLONES*  Difficulty breathing. Erythromycin *MACROLIDES*  Allergies Reconciled   Medication History [name], CMA; 03/09/2019 1:41 PM) metFORMIN HCl (1000MG  Tablet, Oral) Active. Losartan Potassium (50MG  Tablet, Oral) Active. Eliquis (5MG  Tablet, Oral) Active. Nitroglycerin (0.4MG  Tab Sublingual, Sublingual) Active. Furosemide (40MG  Tablet, Oral) Active. Metoprolol Tartrate (25MG  Tablet, Oral) Active. Amiodarone HCl (200MG  Tablet, Oral) Active. Finasteride (5MG  Tablet, Oral) Active. Iron (325 (65 Fe)MG Tablet, 1 tab Oral two times daily) Active. amLODIPine Besylate (2.5MG  Tablet, Oral) Active. Medications Reconciled    Review of Systems Calvin Klein MD; 03/23/2019 5:40 PM) All other systems negative  Vitals [name] CMA; 03/09/2019 1:42 PM) 03/09/2019 1:41 PM Weight: 245.4 lb Height: 72in Body Surface Area: 2.32 m Body Mass Index: 33.28 kg/m  Temp.: 97.12F  Pulse: 70 (Regular)  BP: 163/98 (Sitting, Left Arm, Standard)       Physical Exam Calvin Klein MD; 03/23/2019 5:40 PM) General Mental Status-Alert. General Appearance-Consistent with stated age. Hydration-Well hydrated. Voice-Normal.  Head and Neck Head-normocephalic, atraumatic with no lesions or palpable masses.  Eye Sclera/Conjunctiva - Bilateral-No scleral icterus.  Chest and Lung Exam Chest and lung exam reveals -quiet, even and  easy respiratory effort with no use of accessory muscles. Inspection Chest Wall - Normal. Back - normal.  Breast - Did not examine.  Cardiovascular Cardiovascular examination reveals -normal pedal pulses bilaterally. Note: regular rate and rhythm  Abdomen Inspection-Inspection Normal. Palpation/Percussion Palpation and Percussion of the abdomen reveal - Soft, Non Tender, No Rebound tenderness, No Rigidity (guarding) and No hepatosplenomegaly. Note: bilious RUQ drain   Peripheral Vascular Upper Extremity Inspection - Bilateral - Normal - No Clubbing, No Cyanosis, No Edema, Pulses Intact. Lower Extremity Palpation - Edema - Bilateral - No edema - Bilateral.  Neurologic Neurologic evaluation reveals -alert and oriented x 3 with no impairment of recent or remote memory. Mental Status-Normal.  Musculoskeletal Global Assessment -Note: no gross deformities.  Normal Exam - Left-Upper Extremity Strength Normal and Lower Extremity Strength Normal. Normal Exam - Right-Upper Extremity Strength Normal and Lower Extremity Strength Normal.  Lymphatic Head & Neck  General Head & Neck Lymphatics: Bilateral - Description - Normal. Axillary  General Axillary Region: Bilateral - Description - Normal. Tenderness - Non Tender.    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 Have sent to scheduling and written epic orders.  Discussed risks and benefits of surgeyr. Current Plans You are being scheduled for surgery- Our schedulers will call you.  You should hear from our office's scheduling department within 5 working days about the location, date, and time of surgery. We try to make accommodations for patient's preferences in scheduling surgery, but sometimes the OR schedule or the surgeon's schedule prevents [place] from making those accommodations.  If you have not heard from our office [phone]) in 5  working days, call the office and ask for your surgeon's nurse.  If you have other questions about your diagnosis, plan, or surgery, call the office and ask for your surgeon's nurse.  Advised patient to stop ASA, anticoagulant, blood thinners, and NSAIDs three days prior to surgery. LIVER ABSCESS (K75.0) Impression: resolved. 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

## 2019-04-07 ENCOUNTER — Encounter

## 2019-04-07 ENCOUNTER — Encounter (HOSPITAL_COMMUNITY): Payer: Self-pay | Admitting: Certified Registered"

## 2019-04-07 ENCOUNTER — Encounter (HOSPITAL_COMMUNITY)
Admission: RE | Disposition: A | Discharge status: Home / Self Care | Payer: Self-pay | Source: Home / Self Care | Attending: General Surgery

## 2019-04-07 ENCOUNTER — Inpatient Hospital Stay (HOSPITAL_COMMUNITY): Payer: Medicare HMO | Admitting: Vascular Surgery

## 2019-04-07 ENCOUNTER — Inpatient Hospital Stay (HOSPITAL_COMMUNITY)
Admission: RE | Admit: 2019-04-07 | Discharge: 2019-04-13 | DRG: 327 | Disposition: A | Discharge status: Home / Self Care | Payer: Medicare HMO | Attending: General Surgery | Admitting: General Surgery

## 2019-04-07 ENCOUNTER — Other Ambulatory Visit: Payer: Self-pay

## 2019-04-07 ENCOUNTER — Inpatient Hospital Stay (HOSPITAL_COMMUNITY): Payer: Medicare HMO | Admitting: Certified Registered"

## 2019-04-07 DIAGNOSIS — I129 Hypertensive chronic kidney disease with stage 1 through stage 4 chronic kidney disease, or unspecified chronic kidney disease: Secondary | ICD-10-CM | POA: Diagnosis present

## 2019-04-07 DIAGNOSIS — Z881 Allergy status to other antibiotic agents status: Secondary | ICD-10-CM | POA: Diagnosis not present

## 2019-04-07 DIAGNOSIS — K660 Peritoneal adhesions (postprocedural) (postinfection): Secondary | ICD-10-CM | POA: Diagnosis present

## 2019-04-07 DIAGNOSIS — D62 Acute posthemorrhagic anemia: Secondary | ICD-10-CM | POA: Diagnosis not present

## 2019-04-07 DIAGNOSIS — K316 Fistula of stomach and duodenum: Secondary | ICD-10-CM | POA: Diagnosis present

## 2019-04-07 DIAGNOSIS — N179 Acute kidney failure, unspecified: Secondary | ICD-10-CM | POA: Diagnosis not present

## 2019-04-07 DIAGNOSIS — I4891 Unspecified atrial fibrillation: Secondary | ICD-10-CM | POA: Diagnosis not present

## 2019-04-07 DIAGNOSIS — Z87891 Personal history of nicotine dependence: Secondary | ICD-10-CM

## 2019-04-07 DIAGNOSIS — I4892 Unspecified atrial flutter: Secondary | ICD-10-CM | POA: Diagnosis not present

## 2019-04-07 DIAGNOSIS — E1122 Type 2 diabetes mellitus with diabetic chronic kidney disease: Secondary | ICD-10-CM | POA: Diagnosis present

## 2019-04-07 DIAGNOSIS — I2510 Atherosclerotic heart disease of native coronary artery without angina pectoris: Secondary | ICD-10-CM | POA: Diagnosis present

## 2019-04-07 DIAGNOSIS — N1831 Chronic kidney disease, stage 3a: Secondary | ICD-10-CM | POA: Diagnosis present

## 2019-04-07 DIAGNOSIS — Z951 Presence of aortocoronary bypass graft: Secondary | ICD-10-CM

## 2019-04-07 DIAGNOSIS — E875 Hyperkalemia: Secondary | ICD-10-CM | POA: Diagnosis not present

## 2019-04-07 DIAGNOSIS — Z7984 Long term (current) use of oral hypoglycemic drugs: Secondary | ICD-10-CM | POA: Diagnosis not present

## 2019-04-07 HISTORY — PX: OTHER SURGICAL HISTORY: SHX169

## 2019-04-07 LAB — PROTIME-INR
INR: 1 (ref 0.8–1.2)
Prothrombin Time: 13.4 seconds (ref 11.4–15.2)

## 2019-04-07 LAB — GLUCOSE, CAPILLARY
Glucose-Capillary: 111 mg/dL — ABNORMAL HIGH (ref 70–99)
Glucose-Capillary: 166 mg/dL — ABNORMAL HIGH (ref 70–99)
Glucose-Capillary: 173 mg/dL — ABNORMAL HIGH (ref 70–99)
Glucose-Capillary: 180 mg/dL — ABNORMAL HIGH (ref 70–99)
Glucose-Capillary: 185 mg/dL — ABNORMAL HIGH (ref 70–99)
Glucose-Capillary: 205 mg/dL — ABNORMAL HIGH (ref 70–99)

## 2019-04-07 SURGERY — REPAIR OF GASTROCUTANEOUS FISTULA
Anesthesia: General | Site: Abdomen

## 2019-04-07 MED ORDER — METOPROLOL TARTRATE 5 MG/5ML IV SOLN
5.0000 mg | Freq: Four times a day (QID) | INTRAVENOUS | Status: DC
Start: 2019-04-07 — End: 2019-04-07

## 2019-04-07 MED ORDER — ROCURONIUM BROMIDE 10 MG/ML (PF) SYRINGE
INTRAVENOUS | Status: AC
Start: 2019-04-07 — End: ?
  Filled 2019-04-07: qty 10

## 2019-04-07 MED ORDER — CHLORHEXIDINE GLUCONATE CLOTH 2 % EX PADS
6.0000 | Freq: Once | CUTANEOUS | Status: DC
Start: 2019-04-07 — End: 2019-04-07

## 2019-04-07 MED ORDER — DEXAMETHASONE SODIUM PHOSPHATE 10 MG/ML IJ SOLN
INTRAMUSCULAR | Status: AC
Start: 2019-04-07 — End: ?
  Filled 2019-04-07: qty 1

## 2019-04-07 MED ORDER — MIDAZOLAM HCL 2 MG/2ML IJ SOLN
INTRAMUSCULAR | Status: AC
Start: 2019-04-07 — End: ?
  Filled 2019-04-07: qty 2

## 2019-04-07 MED ORDER — PANTOPRAZOLE SODIUM 40 MG IV SOLR
40.0000 mg | Freq: Every day | INTRAVENOUS | Status: DC
Start: 2019-04-07 — End: 2019-04-09
  Administered 2019-04-07 – 2019-04-08 (×2): 40 mg via INTRAVENOUS
  Filled 2019-04-07 (×2): qty 40

## 2019-04-07 MED ORDER — ACETAMINOPHEN 500 MG PO TABS
1000.0000 mg | ORAL | Status: AC
Start: 2019-04-07 — End: 2019-04-07
  Administered 2019-04-07: 08:00:00 1000 mg via ORAL
  Filled 2019-04-07: qty 2

## 2019-04-07 MED ORDER — PROCHLORPERAZINE EDISYLATE 10 MG/2ML IJ SOLN
5.0000 mg | Freq: Four times a day (QID) | INTRAMUSCULAR | Status: DC | PRN
Start: 2019-04-07 — End: 2019-04-13

## 2019-04-07 MED ORDER — FENTANYL CITRATE (PF) 250 MCG/5ML IJ SOLN
INTRAMUSCULAR | Status: AC
Start: 2019-04-07 — End: ?
  Filled 2019-04-07: qty 5

## 2019-04-07 MED ORDER — METOPROLOL TARTRATE 5 MG/5ML IV SOLN
5.0000 mg | Freq: Four times a day (QID) | INTRAVENOUS | Status: DC
Start: 2019-04-07 — End: 2019-04-10
  Administered 2019-04-08 – 2019-04-10 (×11): 5 mg via INTRAVENOUS
  Filled 2019-04-07 (×12): qty 5

## 2019-04-07 MED ORDER — DIPHENHYDRAMINE HCL 50 MG/ML IJ SOLN
12.5000 mg | Freq: Four times a day (QID) | INTRAMUSCULAR | Status: DC | PRN
Start: 2019-04-07 — End: 2019-04-07

## 2019-04-07 MED ORDER — MIDAZOLAM HCL 5 MG/5ML IJ SOLN
INTRAMUSCULAR | Status: DC | PRN
Start: 2019-04-07 — End: 2019-04-07
  Administered 2019-04-07: 2 mg via INTRAVENOUS

## 2019-04-07 MED ORDER — FENTANYL CITRATE (PF) 100 MCG/2ML IJ SOLN
INTRAMUSCULAR | Status: DC
Start: 2019-04-07 — End: 2019-04-07
  Filled 2019-04-07: qty 2

## 2019-04-07 MED ORDER — PROCHLORPERAZINE MALEATE 10 MG PO TABS
10.0000 mg | Freq: Four times a day (QID) | ORAL | Status: DC | PRN
Start: 2019-04-07 — End: 2019-04-13
  Filled 2019-04-07: qty 1

## 2019-04-07 MED ORDER — METHOCARBAMOL 1000 MG/10ML IJ SOLN
500.0000 mg | Freq: Three times a day (TID) | INTRAMUSCULAR | Status: DC | PRN
Start: 2019-04-07 — End: 2019-04-13
  Filled 2019-04-07: qty 5

## 2019-04-07 MED ORDER — ONDANSETRON HCL 4 MG/2ML IJ SOLN
INTRAMUSCULAR | Status: AC
Start: 2019-04-07 — End: ?
  Filled 2019-04-07: qty 2

## 2019-04-07 MED ORDER — ONDANSETRON HCL 4 MG/2ML IJ SOLN
4.0000 mg | Freq: Four times a day (QID) | INTRAMUSCULAR | Status: DC | PRN
Start: 2019-04-07 — End: 2019-04-09

## 2019-04-07 MED ORDER — PHENYLEPHRINE 40 MCG/ML (10ML) SYRINGE FOR IV PUSH (FOR BLOOD PRESSURE SUPPORT)
INTRAVENOUS | Status: DC | PRN
Start: 2019-04-07 — End: 2019-04-07
  Administered 2019-04-07 (×4): 80 ug via INTRAVENOUS
  Administered 2019-04-07 (×2): 120 ug via INTRAVENOUS

## 2019-04-07 MED ORDER — MORPHINE SULFATE 2 MG/ML IV SOLN
INTRAVENOUS | Status: AC
Start: 2019-04-07 — End: 2019-04-08
  Filled 2019-04-07: qty 30

## 2019-04-07 MED ORDER — CEFAZOLIN SODIUM-DEXTROSE 2-4 GM/100ML-% IV SOLN
2.0000 g | INTRAVENOUS | Status: AC
Start: 2019-04-07 — End: 2019-04-07
  Administered 2019-04-07: 2 g via INTRAVENOUS
  Filled 2019-04-07: qty 100

## 2019-04-07 MED ORDER — FENTANYL CITRATE (PF) 100 MCG/2ML IJ SOLN
25.0000 ug | INTRAMUSCULAR | Status: DC | PRN
Start: 2019-04-07 — End: 2019-04-07

## 2019-04-07 MED ORDER — PROPOFOL 10 MG/ML IV BOLUS
INTRAVENOUS | Status: DC | PRN
Start: 2019-04-07 — End: 2019-04-07
  Administered 2019-04-07: 140 mg via INTRAVENOUS

## 2019-04-07 MED ORDER — 0.9 % SODIUM CHLORIDE (POUR BTL) OPTIME
TOPICAL | Status: DC | PRN
Start: 2019-04-07 — End: 2019-04-07
  Administered 2019-04-07: 2000 mL
  Administered 2019-04-07 (×2): 1000 mL

## 2019-04-07 MED ORDER — FENTANYL CITRATE (PF) 100 MCG/2ML IJ SOLN
INTRAMUSCULAR | Status: DC | PRN
Start: 2019-04-07 — End: 2019-04-07
  Administered 2019-04-07: 100 ug via INTRAVENOUS
  Administered 2019-04-07 (×2): 50 ug via INTRAVENOUS

## 2019-04-07 MED ORDER — PROPOFOL 10 MG/ML IV BOLUS
INTRAVENOUS | Status: AC
Start: 2019-04-07 — End: ?
  Filled 2019-04-07: qty 20

## 2019-04-07 MED ORDER — ROCURONIUM BROMIDE 10 MG/ML (PF) SYRINGE
INTRAVENOUS | Status: DC | PRN
Start: 2019-04-07 — End: 2019-04-07
  Administered 2019-04-07: 50 mg via INTRAVENOUS
  Administered 2019-04-07: 10 mg via INTRAVENOUS
  Administered 2019-04-07: 20 mg via INTRAVENOUS
  Administered 2019-04-07 (×2): 10 mg via INTRAVENOUS

## 2019-04-07 MED ORDER — SODIUM CHLORIDE 0.9% FLUSH
5.0000 mL | Freq: Three times a day (TID) | INTRAVENOUS | Status: DC
Start: 2019-04-07 — End: 2019-04-13
  Administered 2019-04-09 – 2019-04-12 (×8): 5 mL

## 2019-04-07 MED ORDER — ACETAMINOPHEN 10 MG/ML IV SOLN
1000.0000 mg | Freq: Four times a day (QID) | INTRAVENOUS | Status: AC
Start: 2019-04-07 — End: 2019-04-08
  Administered 2019-04-07 – 2019-04-08 (×4): 1000 mg via INTRAVENOUS
  Filled 2019-04-07 (×4): qty 100

## 2019-04-07 MED ORDER — STERILE WATER FOR IRRIGATION IR SOLN
Status: DC | PRN
Start: 2019-04-07 — End: 2019-04-07
  Administered 2019-04-07: 1000 mL

## 2019-04-07 MED ORDER — NALOXONE HCL 0.4 MG/ML IJ SOLN
0.4000 mg | INTRAMUSCULAR | Status: DC | PRN
Start: 2019-04-07 — End: 2019-04-09

## 2019-04-07 MED ORDER — CEFAZOLIN SODIUM-DEXTROSE 2-4 GM/100ML-% IV SOLN
2.0000 g | Freq: Three times a day (TID) | INTRAVENOUS | Status: AC
Start: 2019-04-07 — End: 2019-04-07
  Administered 2019-04-07: 18:00:00 2 g via INTRAVENOUS
  Filled 2019-04-07: qty 100

## 2019-04-07 MED ORDER — LACTATED RINGERS IV SOLN
INTRAVENOUS | Status: DC
Start: 2019-04-07 — End: 2019-04-07
  Administered 2019-04-07 (×3): via INTRAVENOUS

## 2019-04-07 MED ORDER — ONDANSETRON HCL 4 MG/2ML IJ SOLN
INTRAMUSCULAR | Status: DC | PRN
Start: 2019-04-07 — End: 2019-04-07
  Administered 2019-04-07: 4 mg via INTRAVENOUS

## 2019-04-07 MED ORDER — LIDOCAINE 2% (20 MG/ML) 5 ML SYRINGE
INTRAMUSCULAR | Status: AC
Start: 2019-04-07 — End: ?
  Filled 2019-04-07: qty 5

## 2019-04-07 MED ORDER — KCL IN DEXTROSE-NACL 20-5-0.45 MEQ/L-%-% IV SOLN
INTRAVENOUS | Status: DC
Start: 2019-04-07 — End: 2019-04-08
  Administered 2019-04-07 – 2019-04-08 (×2): via INTRAVENOUS
  Filled 2019-04-07 (×2): qty 1000

## 2019-04-07 MED ORDER — LIDOCAINE 2% (20 MG/ML) 5 ML SYRINGE
INTRAMUSCULAR | Status: DC | PRN
Start: 2019-04-07 — End: 2019-04-07
  Administered 2019-04-07: 100 mg via INTRAVENOUS

## 2019-04-07 MED ORDER — PHENYLEPHRINE HCL (PRESSORS) 10 MG/ML IV SOLN
INTRAVENOUS | Status: DC | PRN
Start: 2019-04-07 — End: 2019-04-07

## 2019-04-07 MED ORDER — DIPHENHYDRAMINE HCL 50 MG/ML IJ SOLN
12.5000 mg | Freq: Four times a day (QID) | INTRAMUSCULAR | Status: DC | PRN
Start: 2019-04-07 — End: 2019-04-09

## 2019-04-07 MED ORDER — SODIUM CHLORIDE 0.9% FLUSH
9.0000 mL | INTRAVENOUS | Status: DC | PRN
Start: 2019-04-07 — End: 2019-04-09

## 2019-04-07 MED ORDER — ENOXAPARIN SODIUM 40 MG/0.4ML ~~LOC~~ SOLN
40.0000 mg | SUBCUTANEOUS | Status: DC
Start: 2019-04-08 — End: 2019-04-09
  Administered 2019-04-08 – 2019-04-09 (×2): 40 mg via SUBCUTANEOUS
  Filled 2019-04-07 (×2): qty 0.4

## 2019-04-07 MED ORDER — DIPHENHYDRAMINE HCL 12.5 MG/5ML PO ELIX
12.5000 mg | Freq: Four times a day (QID) | ORAL | Status: DC | PRN
Start: 2019-04-07 — End: 2019-04-09

## 2019-04-07 MED ORDER — DEXAMETHASONE SODIUM PHOSPHATE 10 MG/ML IJ SOLN
INTRAMUSCULAR | Status: DC | PRN
Start: 2019-04-07 — End: 2019-04-07
  Administered 2019-04-07: 4 mg via INTRAVENOUS

## 2019-04-07 MED ORDER — MORPHINE SULFATE 2 MG/ML IV SOLN
INTRAVENOUS | Status: DC
Start: 2019-04-07 — End: 2019-04-09
  Administered 2019-04-07: 13:00:00 via INTRAVENOUS
  Administered 2019-04-07: 7 mg via INTRAVENOUS
  Administered 2019-04-07: 18 mg via INTRAVENOUS
  Administered 2019-04-08: 8 mg via INTRAVENOUS
  Administered 2019-04-08: 14:00:00 via INTRAVENOUS
  Administered 2019-04-08: 10 mg via INTRAVENOUS
  Administered 2019-04-08: 12 mg via INTRAVENOUS
  Administered 2019-04-08 (×2): 6 mg via INTRAVENOUS
  Administered 2019-04-09: 3 mg via INTRAVENOUS
  Administered 2019-04-09: 19 mg via INTRAVENOUS
  Administered 2019-04-09: 5 mg via INTRAVENOUS
  Administered 2019-04-09: 10 mg via INTRAVENOUS
  Filled 2019-04-07: qty 30

## 2019-04-07 MED ORDER — ONDANSETRON HCL 4 MG/2ML IJ SOLN
4.0000 mg | Freq: Once | INTRAMUSCULAR | Status: DC | PRN
Start: 2019-04-07 — End: 2019-04-07

## 2019-04-07 MED ORDER — DIPHENHYDRAMINE HCL 12.5 MG/5ML PO ELIX
12.5000 mg | Freq: Four times a day (QID) | ORAL | Status: DC | PRN
Start: 2019-04-07 — End: 2019-04-07

## 2019-04-07 MED ORDER — PHENYLEPHRINE HCL-NACL 10-0.9 MG/250ML-% IV SOLN
INTRAVENOUS | Status: DC | PRN
Start: 2019-04-07 — End: 2019-04-07
  Administered 2019-04-07: 50 ug/min via INTRAVENOUS

## 2019-04-07 MED ORDER — INSULIN ASPART 100 UNIT/ML ~~LOC~~ SOLN
0.0000 [IU] | SUBCUTANEOUS | Status: DC
Start: 2019-04-07 — End: 2019-04-13
  Administered 2019-04-07 (×3): 3 [IU] via SUBCUTANEOUS
  Administered 2019-04-08 (×5): 2 [IU] via SUBCUTANEOUS
  Administered 2019-04-08: 01:00:00 3 [IU] via SUBCUTANEOUS
  Administered 2019-04-09 (×2): 2 [IU] via SUBCUTANEOUS
  Administered 2019-04-09: 3 [IU] via SUBCUTANEOUS
  Administered 2019-04-09 (×2): 2 [IU] via SUBCUTANEOUS
  Administered 2019-04-10: 3 [IU] via SUBCUTANEOUS
  Administered 2019-04-10 – 2019-04-11 (×5): 2 [IU] via SUBCUTANEOUS
  Administered 2019-04-11: 3 [IU] via SUBCUTANEOUS
  Administered 2019-04-12 (×3): 2 [IU] via SUBCUTANEOUS
  Administered 2019-04-12: 05:00:00 4 [IU] via SUBCUTANEOUS

## 2019-04-07 MED ORDER — SUGAMMADEX SODIUM 200 MG/2ML IV SOLN
INTRAVENOUS | Status: DC | PRN
Start: 2019-04-07 — End: 2019-04-07
  Administered 2019-04-07: 200 mg via INTRAVENOUS

## 2019-04-07 SURGICAL SUPPLY — 52 items
APL PRP STRL LF DISP 70% ISPRP (MISCELLANEOUS) ×1
BIOPATCH RED 1 DISK 7.0 (GAUZE/BANDAGES/DRESSINGS) ×2
BIOPATCH RED 1IN DISK 7.0MM (GAUZE/BANDAGES/DRESSINGS) ×1
BLADE CLIPPER SURG (BLADE)
CANISTER SUCT 3000ML PPV (MISCELLANEOUS) ×3
CHLORAPREP W/TINT 26 (MISCELLANEOUS) ×3
COVER SURGICAL LIGHT HANDLE (MISCELLANEOUS) ×3
COVER WAND RF STERILE (DRAPES) ×3
DRAIN CHANNEL 19F RND (DRAIN) ×3
DRAPE LAPAROSCOPIC ABDOMINAL (DRAPES) ×3
DRAPE WARM FLUID 44X44 (DRAPES) ×3
DRSG COVADERM 4X14 (GAUZE/BANDAGES/DRESSINGS) ×3
DRSG COVADERM 4X6 (GAUZE/BANDAGES/DRESSINGS) ×3
DRSG OPSITE POSTOP 4X12 (GAUZE/BANDAGES/DRESSINGS) ×3
DRSG TEGADERM 4X4.75 (GAUZE/BANDAGES/DRESSINGS) ×3
ELECT BLADE 6.5 EXT (BLADE) ×3
ELECT CAUTERY BLADE 6.4 (BLADE) ×3
ELECT REM PT RETURN 9FT ADLT (ELECTROSURGICAL) ×3
EVACUATOR SILICONE 100CC (DRAIN) ×3
GLOVE BIO SURGEON STRL SZ 6 (GLOVE) ×6
GLOVE INDICATOR 6.5 STRL GRN (GLOVE) ×3
GLOVE SS BIOGEL STRL SZ 7 (GLOVE) ×1
GLOVE SUPERSENSE BIOGEL SZ 7 (GLOVE) ×2
GOWN STRL REUS W/ TWL LRG LVL3 (GOWN DISPOSABLE) ×1
GOWN STRL REUS W/TWL 2XL LVL3 (GOWN DISPOSABLE) ×3
GOWN STRL REUS W/TWL LRG LVL3 (GOWN DISPOSABLE) ×3
GOWN STRL REUS W/TWL XL LVL3 (GOWN DISPOSABLE) ×3
KIT BASIN OR (CUSTOM PROCEDURE TRAY) ×3
KIT TURNOVER KIT B (KITS) ×3
NS IRRIG 1000ML POUR BTL (IV SOLUTION) ×12
PACK GENERAL/GYN (CUSTOM PROCEDURE TRAY) ×3
PAD ARMBOARD 7.5X6 YLW CONV (MISCELLANEOUS) ×3
PENCIL SMOKE EVACUATOR (MISCELLANEOUS) ×3
SHEARS FOC LG CVD HARMONIC 17C (MISCELLANEOUS) ×3
SLEEVE SUCTION CATH 165 (SLEEVE) ×3
SPECIMEN JAR LARGE (MISCELLANEOUS)
SPONGE LAP 18X18 RF (DISPOSABLE) ×6
STAPLER VISISTAT 35W (STAPLE) ×3
SUCTION POOLE HANDLE (INSTRUMENTS) ×3
SUT ETHILON 2 0 FS 18 (SUTURE) ×3
SUT PDS AB 1 TP1 96 (SUTURE) ×6
SUT SILK 2 0 TIES 10X30 (SUTURE) ×3
SUT SILK 2 0SH CR/8 30 (SUTURE) ×9
SUT SILK 3 0 SH CR/8 (SUTURE) ×3
SUT SILK 3 0 TIES 10X30 (SUTURE) ×3
SUT VIC AB 2-0 SH 18 (SUTURE) ×3
SUT VIC AB 3-0 SH 18 (SUTURE) ×3
SUT VICRYL AB 2 0 TIES (SUTURE) ×3
SUT VICRYL AB 3 0 TIES (SUTURE) ×3
TOWEL GREEN STERILE (TOWEL DISPOSABLE) ×3
TRAY FOLEY MTR SLVR 16FR STAT (SET/KITS/TRAYS/PACK) ×3
YANKAUER SUCT BULB TIP NO VENT (SUCTIONS) ×3

## 2019-04-07 NOTE — Anesthesia Postprocedure Evaluation (Signed)
Anesthesia Post Note  Patient: IZEK CORVINO  Procedure(s) Performed: REPAIR OF DUODENAL FISTULA (N/A Abdomen)     Patient location during evaluation: PACU Anesthesia Type: General Level of consciousness: awake and alert Pain management: pain level controlled Vital Signs Assessment: post-procedure vital signs reviewed and stable Respiratory status: spontaneous breathing, nonlabored ventilation, respiratory function stable and patient connected to nasal cannula oxygen Cardiovascular status: blood pressure returned to baseline and stable Postop Assessment: no apparent nausea or vomiting Anesthetic complications: no    Last Vitals:  Vitals:   04/07/19 1305 04/07/19 1332  BP:  102/76  Pulse: 67 71  Resp: 14 14  Temp: 36.5 C 36.4 C  SpO2: 90% 96%    Last Pain:  Vitals:   04/07/19 1332  TempSrc: Oral  PainSc:                  Catalina Gravel

## 2019-04-07 NOTE — Interval H&P Note (Signed)
History and Physical Interval Note:  04/07/2019 9:08 AM  [name]  has presented today for surgery, with the diagnosis of DUODENAL FISTULA.  The various methods of treatment have been discussed with the patient and family. After consideration of risks, benefits and other options for treatment, the patient has consented to  Procedure(s): REPAIR OF DUODENAL FISTULA (N/A) as a surgical intervention.  The patient's history has been reviewed, patient examined, no change in status, stable for surgery.  I have reviewed the patient's chart and labs.  Questions were answered to the patient's satisfaction.     [name]

## 2019-04-07 NOTE — Op Note (Signed)
PRE-OPERATIVE DIAGNOSIS: Duodenal fistula  POST-OPERATIVE DIAGNOSIS:  Same  PROCEDURE:  Procedure(s): Repair of duodenal fistula with omental flap  SURGEON:  Surgeon(s): [name], MD  Assistant: Fanny Skates, MD  ANESTHESIA:   general  DRAINS: (69 Fr) Blake drain(s) in the RUQ   LOCAL MEDICATIONS USED:  NONE  SPECIMEN:  No Specimen  DISPOSITION OF SPECIMEN:  N/A  COUNTS:  YES  DICTATION: .Dragon Dictation  PLAN OF CARE: Admit to inpatient   PATIENT DISPOSITION:  PACU - hemodynamically stable.  FINDINGS: Fistula in the proximal second portion of the duodenum  EBL: 150 mL  Indication for procedure:  Pt had post CABG cholecystitis and received perc chole drain.  We were unable to get drain clamped without symptoms recurring.  He was scheduled for lap chole twice and got delayed due to [place].  He finally was able to get lap chole in June 2020.  He developed a post op abscess and received a perc drain.  He drained for quite some time.  He developed a bile leak and it was seen to be from the duodenum.  Conservative management did not allow the fistula to heal.  He gained strength and nutritional status and fistula was scheduled electively.     PROCEDURE:  Patient was identified in the holding room and taken to the operating room where he was placed supine on the operating room table.  General anesthesia was induced.  A Foley catheter was placed.  The abdomen was prepped and draped in sterile fashion.  The right upper abdomen drain was prepped into the field.  A timeout was performed according to the surgical safety checklist.  When all was correct, we continued.  A upper midline incision was made with a #10 blade as a continuation of his chest incision.  The subtenons tissues were divided with cautery.  The fascia was entered with the cautery as well, while elevating the abdominal wall.  The peritoneum was opened and then the fascial incision was carried out the length of the  skin incision.  The omental adhesions were taken down off of the abdominal wall.  The Bookwalter self-retaining retractor was placed to assist with visualization.  At this point the lesser sac was opened and the transverse colon and right colon were retracted inferiorly.  The hepatic flexure was taken down.  The right upper quadrant adhesions were also taken down.  The omentum was adherent to the gallbladder fossa where the gallbladder used to be.  This was carefully taken down.  The drain tract was identified at the lower border of the right hepatic lobe.  The drain was then identified on the undersurface of the liver.  The fistula was identified.  The drain was then removed.  The duodenum was then assessed at the site of the fistula.  The portal was identified and was medial to the fistula.  The second portion of the duodenum had folded up and adhered to the drain.  The drain had eroded into the duodenum at that point.    The duodenum was separated from the porta hepatis and then the site where it was adherent to the liver was divided sharply with the scissors.  The fistula was then completely exposed.  Fortunately, the edges of the fistula were very soft.  The duodenum was fully kocherized to make sure there was good mobility.  The fistula was closed transversely with interrupted 2-0 silk sutures as the edges were in good condition.  Care was taken to avoid  gaps between the sutures.  This was far enough from the ampulla that it should not cause any injury.  Stay sutures were then placed posteriorly and anteriorly.  A small segment of adjacent omentum was secured in the sutures.  There was a large resection of omentum attached to the stomach that was able to nicely fold over the repair as well.  This was secured down to the retroperitoneal tissues over the kidney.  A drain was passed through the abdominal wall through one of his previous lap chole incisions.  This was placed longitudinally adjacent to the  omental flap over the fistula.  The abdomen was irrigated.  The fascia was then closed using running #1 PDS suture x2.  The skin was irrigated and closed with skin staples.  The old drain site was dressed with a small island dressing.  The new drain site was dressed with soft sterile dressing.  The midline was dressed with a island dressing.    The patient was allowed to emerge from anesthesia and was taken to the PACU in stable condition.  Needle, sponge, and instrument counts were correct x2.

## 2019-04-07 NOTE — Anesthesia Procedure Notes (Signed)
Procedure Name: Intubation Date/Time: 04/07/2019 9:25 AM Performed by: Moshe Salisbury, CRNA Pre-anesthesia Checklist: Patient identified, Emergency Drugs available, Suction available and Patient being monitored Patient Re-evaluated:Patient Re-evaluated prior to induction Oxygen Delivery Method: Circle System Utilized Preoxygenation: Pre-oxygenation with 100% oxygen Induction Type: IV induction Ventilation: Mask ventilation without difficulty Laryngoscope Size: Mac and 4 Grade View: Grade I Tube type: Oral Tube size: 8.0 mm Number of attempts: 1 Airway Equipment and Method: Stylet Placement Confirmation: ETT inserted through vocal cords under direct vision,  positive ETCO2 and breath sounds checked- equal and bilateral Secured at: 23 cm Tube secured with: Tape Dental Injury: Teeth and Oropharynx as per pre-operative assessment

## 2019-04-07 NOTE — Transfer of Care (Signed)
Immediate Anesthesia Transfer of Care Note  Patient: [name]  Procedure(s) Performed: REPAIR OF DUODENAL FISTULA (N/A Abdomen)  Patient Location: PACU  Anesthesia Type:General  Level of Consciousness: drowsy and patient cooperative  Airway & Oxygen Therapy: Patient Spontanous Breathing and Patient connected to nasal cannula oxygen  Post-op Assessment: Report given to RN, Post -op Vital signs reviewed and stable and Patient moving all extremities  Post vital signs: Reviewed and stable  Last Vitals:  Vitals Value Taken Time  BP    Temp    Pulse    Resp    SpO2      Last Pain:  Vitals:   04/07/19 0757  TempSrc:   PainSc: 0-No pain         Complications: No apparent anesthesia complications

## 2019-04-08 ENCOUNTER — Encounter (HOSPITAL_COMMUNITY): Payer: Self-pay | Admitting: General Practice

## 2019-04-08 LAB — COMPREHENSIVE METABOLIC PANEL
ALT: 54 U/L — ABNORMAL HIGH (ref 0–44)
AST: 61 U/L — ABNORMAL HIGH (ref 15–41)
Albumin: 2.9 g/dL — ABNORMAL LOW (ref 3.5–5.0)
Alkaline Phosphatase: 47 U/L (ref 38–126)
Anion gap: 12 (ref 5–15)
BUN: 22 mg/dL (ref 8–23)
CO2: 21 mmol/L — ABNORMAL LOW (ref 22–32)
Calcium: 8.7 mg/dL — ABNORMAL LOW (ref 8.9–10.3)
Chloride: 101 mmol/L (ref 98–111)
Creatinine, Ser: 1.87 mg/dL — ABNORMAL HIGH (ref 0.61–1.24)
GFR calc Af Amer: 39 mL/min — ABNORMAL LOW (ref >60)
GFR calc non Af Amer: 34 mL/min — ABNORMAL LOW (ref >60)
Glucose, Bld: 156 mg/dL — ABNORMAL HIGH (ref 70–99)
Potassium: 5.5 mmol/L — ABNORMAL HIGH (ref 3.5–5.1)
Sodium: 134 mmol/L — ABNORMAL LOW (ref 135–145)
Total Bilirubin: 0.8 mg/dL (ref 0.3–1.2)
Total Protein: 5.9 g/dL — ABNORMAL LOW (ref 6.5–8.1)

## 2019-04-08 LAB — GLUCOSE, CAPILLARY
Glucose-Capillary: 129 mg/dL — ABNORMAL HIGH (ref 70–99)
Glucose-Capillary: 131 mg/dL — ABNORMAL HIGH (ref 70–99)
Glucose-Capillary: 134 mg/dL — ABNORMAL HIGH (ref 70–99)
Glucose-Capillary: 134 mg/dL — ABNORMAL HIGH (ref 70–99)
Glucose-Capillary: 140 mg/dL — ABNORMAL HIGH (ref 70–99)
Glucose-Capillary: 157 mg/dL — ABNORMAL HIGH (ref 70–99)

## 2019-04-08 LAB — CBC
HCT: 33.3 % — ABNORMAL LOW (ref 39.0–52.0)
Hemoglobin: 11.1 g/dL — ABNORMAL LOW (ref 13.0–17.0)
MCH: 29.3 pg (ref 26.0–34.0)
MCHC: 33.3 g/dL (ref 30.0–36.0)
MCV: 87.9 fL (ref 80.0–100.0)
Platelets: 210 K/uL (ref 150–400)
RBC: 3.79 MIL/uL — ABNORMAL LOW (ref 4.22–5.81)
RDW: 17.1 % — ABNORMAL HIGH (ref 11.5–15.5)
WBC: 14.5 K/uL — ABNORMAL HIGH (ref 4.0–10.5)
nRBC: 0 % (ref 0.0–0.2)

## 2019-04-08 LAB — PROTIME-INR
INR: 1.1 (ref 0.8–1.2)
Prothrombin Time: 13.6 seconds (ref 11.4–15.2)

## 2019-04-08 LAB — PHOSPHORUS: Phosphorus: 4.7 mg/dL — ABNORMAL HIGH (ref 2.5–4.6)

## 2019-04-08 LAB — MAGNESIUM: Magnesium: 1.3 mg/dL — ABNORMAL LOW (ref 1.7–2.4)

## 2019-04-08 MED ORDER — PHENOL 1.4 % MT LIQD
1.0000 | OROMUCOSAL | Status: DC | PRN
Start: 2019-04-08 — End: 2019-04-13
  Filled 2019-04-08: qty 177

## 2019-04-08 MED ORDER — CHLORHEXIDINE GLUCONATE CLOTH 2 % EX PADS
6.0000 | Freq: Every day | CUTANEOUS | Status: DC
Start: 2019-04-08 — End: 2019-04-13
  Administered 2019-04-08 – 2019-04-13 (×6): 6 via TOPICAL

## 2019-04-08 MED ORDER — SODIUM CHLORIDE 0.9 % IV BOLUS
500.0000 mL | Freq: Once | INTRAVENOUS | Status: AC
Start: 2019-04-08 — End: 2019-04-08
  Administered 2019-04-08: 500 mL via INTRAVENOUS

## 2019-04-08 MED ORDER — MAGNESIUM SULFATE 2 GM/50ML IV SOLN
2.0000 g | Freq: Once | INTRAVENOUS | Status: AC
Start: 2019-04-08 — End: 2019-04-08
  Administered 2019-04-08: 2 g via INTRAVENOUS
  Filled 2019-04-08: qty 50

## 2019-04-08 MED ORDER — MENTHOL 3 MG MT LOZG
1.0000 | OROMUCOSAL | Status: DC | PRN
Start: 2019-04-08 — End: 2019-04-13
  Filled 2019-04-08: qty 9

## 2019-04-08 MED ORDER — DEXTROSE-NACL 5-0.45 % IV SOLN
INTRAVENOUS | Status: DC
Start: 2019-04-08 — End: 2019-04-13
  Administered 2019-04-08 – 2019-04-13 (×6): via INTRAVENOUS

## 2019-04-08 NOTE — Progress Notes (Signed)
1 Day Post-Op   Subjective/Chief Complaint: Pain tolerable.  No n/v.     Objective: Vital signs in last 24 hours: Temp:  [97.4 F (36.3 C)-98 F (36.7 C)] 97.4 F (36.3 C) (12/09 9381) Pulse Rate:  [66-90] 80 (12/09 0633) Resp:  [8-18] 15 (12/09 0829) BP: (91-137)/(48-76) 129/64 (12/09 0175) SpO2:  [90 %-98 %] 95 % (12/09 0829)    Intake/Output from previous day: 12/08 0701 - 12/09 0700 In: 1300 [I.V.:1300] Out: 875 [Urine:600; Emesis/NG output:50; Drains:125; Blood:100] Intake/Output this shift: No intake/output data recorded.  General appearance: alert, cooperative and mild distress Resp: breathing comfortably GI: soft, approp tender.  some dried blood around drain site.  sanguinous output in right blake drain.   Extremities: extremities normal, atraumatic, no cyanosis or edema  Lab Results:  Recent Labs    04/07/19 1412 04/08/19 0240  WBC 14.7* 14.5*  HGB 12.7* 11.1*  HCT 38.7* 33.3*  PLT 198 210   BMET Recent Labs    04/07/19 1412 04/08/19 0240  NA  --  134*  K  --  5.5*  CL  --  101  CO2  --  21*  GLUCOSE  --  156*  BUN  --  22  CREATININE 1.61* 1.87*  CALCIUM  --  8.7*   PT/INR Recent Labs    04/07/19 0849 04/08/19 0240  LABPROT 13.4 13.6  INR 1.0 1.1   ABG No results for input(s): PHART, HCO3 in the last 72 hours.  Invalid input(s): PCO2, PO2  Studies/Results: No results found.  Anti-infectives: Anti-infectives (From admission, onward)   Start     Dose/Rate Route Frequency Ordered Stop   04/07/19 1700  ceFAZolin (ANCEF) IVPB 2g/100 mL premix     2 g 200 mL/hr over 30 Minutes Intravenous Every 8 hours 04/07/19 1330 04/07/19 1819   04/07/19 0745  ceFAZolin (ANCEF) IVPB 2g/100 mL premix     2 g 200 mL/hr over 30 Minutes Intravenous On call to O.R. 04/07/19 1025 04/07/19 0912      Assessment/Plan: s/p Procedure(s): REPAIR OF DUODENAL FISTULA (N/A) NGT at least until tomorrow  NPO, strict A fib/flutter - discussed with pharmacy.   He received dose yesterday.  Will hold today and plan restart tomorrow.  Metoprolol IV On eliquis at home.  Giving ppx lovenox today and will plan full dose lovenox tomorrow if HCT reasonably stable.  Wound plan restarting eliquis closer to discharge AKI on CKD - will give bolus today. Hyperkalemia - switch out IV fluids DM - holding metformin, ssi while in house.   HTN - metoprolol IV.  Will plan to restart norvasc once ngt out.   CAD - restart ASA later.  On beta blockade.   ABL anemia - will follow.     LOS: 1 day    [name] 04/08/2019

## 2019-04-09 LAB — CBC
HCT: 29.2 % — ABNORMAL LOW (ref 39.0–52.0)
Hemoglobin: 9.4 g/dL — ABNORMAL LOW (ref 13.0–17.0)
MCH: 28.7 pg (ref 26.0–34.0)
MCHC: 32.2 g/dL (ref 30.0–36.0)
MCV: 89 fL (ref 80.0–100.0)
Platelets: 156 K/uL (ref 150–400)
RBC: 3.28 MIL/uL — ABNORMAL LOW (ref 4.22–5.81)
RDW: 17.2 % — ABNORMAL HIGH (ref 11.5–15.5)
WBC: 12 K/uL — ABNORMAL HIGH (ref 4.0–10.5)
nRBC: 0 % (ref 0.0–0.2)

## 2019-04-09 LAB — BASIC METABOLIC PANEL
Anion gap: 8 (ref 5–15)
BUN: 19 mg/dL (ref 8–23)
CO2: 22 mmol/L (ref 22–32)
Calcium: 8.6 mg/dL — ABNORMAL LOW (ref 8.9–10.3)
Chloride: 103 mmol/L (ref 98–111)
Creatinine, Ser: 1.31 mg/dL — ABNORMAL HIGH (ref 0.61–1.24)
GFR calc Af Amer: >60 mL/min (ref >60)
GFR calc non Af Amer: 52 mL/min — ABNORMAL LOW (ref >60)
Glucose, Bld: 151 mg/dL — ABNORMAL HIGH (ref 70–99)
Potassium: 4.7 mmol/L (ref 3.5–5.1)
Sodium: 133 mmol/L — ABNORMAL LOW (ref 135–145)

## 2019-04-09 LAB — GLUCOSE, CAPILLARY
Glucose-Capillary: 143 mg/dL — ABNORMAL HIGH (ref 70–99)
Glucose-Capillary: 144 mg/dL — ABNORMAL HIGH (ref 70–99)
Glucose-Capillary: 131 mg/dL — ABNORMAL HIGH (ref 70–99)
Glucose-Capillary: 137 mg/dL — ABNORMAL HIGH (ref 70–99)
Glucose-Capillary: 116 mg/dL — ABNORMAL HIGH (ref 70–99)

## 2019-04-09 MED ORDER — ENOXAPARIN SODIUM 120 MG/0.8ML ~~LOC~~ SOLN
105.0000 mg | Freq: Two times a day (BID) | SUBCUTANEOUS | Status: DC
Start: 2019-04-09 — End: 2019-04-13
  Administered 2019-04-09 – 2019-04-13 (×8): 105 mg via SUBCUTANEOUS
  Filled 2019-04-09 (×12): qty 0.7

## 2019-04-09 MED ORDER — OXYCODONE HCL 5 MG PO TABS
5.0000 mg | ORAL | Status: DC | PRN
Start: 2019-04-09 — End: 2019-04-09

## 2019-04-09 MED ORDER — OXYCODONE HCL 5 MG PO TABS
5.0000 mg | ORAL | Status: DC | PRN
Start: 2019-04-09 — End: 2019-04-13
  Administered 2019-04-09 – 2019-04-13 (×10): 10 mg via ORAL
  Filled 2019-04-09 (×10): qty 2

## 2019-04-09 MED ORDER — AMIODARONE HCL 200 MG PO TABS
200.0000 mg | Freq: Every day | ORAL | Status: DC
Start: 2019-04-09 — End: 2019-04-13
  Administered 2019-04-09 – 2019-04-13 (×5): 200 mg via ORAL
  Filled 2019-04-09 (×6): qty 1

## 2019-04-09 MED ORDER — FINASTERIDE 5 MG PO TABS
5.0000 mg | Freq: Every day | ORAL | Status: DC
Start: 2019-04-09 — End: 2019-04-13
  Administered 2019-04-09 – 2019-04-13 (×5): 5 mg via ORAL
  Filled 2019-04-09 (×5): qty 1

## 2019-04-09 MED ORDER — ACETAMINOPHEN 325 MG PO TABS
650.0000 mg | Freq: Four times a day (QID) | ORAL | Status: DC | PRN
Start: 2019-04-09 — End: 2019-04-13
  Administered 2019-04-09 – 2019-04-11 (×4): 650 mg via ORAL
  Filled 2019-04-09 (×4): qty 2

## 2019-04-09 MED ORDER — PANTOPRAZOLE SODIUM 40 MG PO TBEC
40.0000 mg | Freq: Every day | ORAL | Status: DC
Start: 2019-04-09 — End: 2019-04-13
  Administered 2019-04-09 – 2019-04-12 (×4): 40 mg via ORAL
  Filled 2019-04-09 (×4): qty 1

## 2019-04-09 MED ORDER — MORPHINE SULFATE (PF) 2 MG/ML IV SOLN
2.0000 mg | INTRAVENOUS | Status: DC | PRN
Start: 2019-04-09 — End: 2019-04-13

## 2019-04-09 NOTE — Progress Notes (Signed)
Foley discontinued per order. Pt tolerated well. Gave him a urinal and instructed him to call for help when he needs to get up. Pt able to use the call bell successfully.

## 2019-04-09 NOTE — Progress Notes (Signed)
ANTICOAGULATION CONSULT NOTE - Follow Up Consult  Pharmacy Consult for Lovenox Indication: atrial fibrillation  Allergies  Allergen Reactions  . Ciprofloxacin Rash  . Erythromycin Ethylsuccinate Rash    Patient Measurements: Height: 5\' 11"  (180.3 cm) Weight: 240 lb (108.9 kg) IBW/kg (Calculated) : 75.3   Vital Signs: Temp: 98 F (36.7 C) (12/10 0428) Temp Source: Oral (12/10 0428) BP: 134/77 (12/10 0428) Pulse Rate: 97 (12/10 0428)  Labs: Recent Labs    04/07/19 0849 04/08/19 0240 04/09/19 0250 04/09/19 0957  HGB  --  11.1* 9.4*  --   HCT  --  33.3* 29.2*  --   PLT  --  210 156  --   LABPROT 13.4 13.6  --   --   INR 1.0 1.1  --   --   CREATININE  --  1.87*  --  1.31*    Estimated Creatinine Clearance: 59.2 mL/min (A) (by C-G formula based on SCr of 1.31 mg/dL (H)).  Assessment: 73 year old to begin Lovenox for Afib while Eliquis on hold post surgery  Goal of Therapy:  Anti-Xa level 0.6-1 units/ml 4hrs after LMWH dose given Monitor platelets by anticoagulation protocol: Yes   Plan:  Lovenox 105 mg sq Q 12 hours starting tonight Watch CBC  Thank you Anette Guarneri, PharmD  04/09/2019,12:49 PM

## 2019-04-09 NOTE — Progress Notes (Signed)
2 Days Post-Op   Subjective/Chief Complaint: Feeling a bit better today.  Not as sore.  No n/v.     Objective: Vital signs in last 24 hours: Temp:  [98 F (36.7 C)-98.6 F (37 C)] 98 F (36.7 C) (12/10 0428) Pulse Rate:  [74-100] 97 (12/10 0428) Resp:  [11-19] 14 (12/10 0844) BP: (126-146)/(72-77) 134/77 (12/10 0428) SpO2:  [93 %-98 %] 98 % (12/10 0844)    Intake/Output from previous day: 12/09 0701 - 12/10 0700 In: 450.4 [I.V.:450.4] Out: 1240 [Urine:1200; Drains:40] Intake/Output this shift: Total I/O In: -  Out: 10 [Drains:10]  General appearance: alert, cooperative and no distress Resp: breathing comfortably GI: soft, approp tender.  some dried blood around drain site.  serosanguinous output in right blake drain.   Extremities: extremities normal, atraumatic, no cyanosis or edema  Lab Results:  Recent Labs    04/08/19 0240 04/09/19 0250  WBC 14.5* 12.0*  HGB 11.1* 9.4*  HCT 33.3* 29.2*  PLT 210 156   BMET Recent Labs    04/08/19 0240 04/09/19 0957  NA 134* 133*  K 5.5* 4.7  CL 101 103  CO2 21* 22  GLUCOSE 156* 151*  BUN 22 19  CREATININE 1.87* 1.31*  CALCIUM 8.7* 8.6*   PT/INR Recent Labs    04/07/19 0849 04/08/19 0240  LABPROT 13.4 13.6  INR 1.0 1.1   ABG No results for input(s): PHART, HCO3 in the last 72 hours.  Invalid input(s): PCO2, PO2  Studies/Results: No results found.  Anti-infectives: Anti-infectives (From admission, onward)   Start     Dose/Rate Route Frequency Ordered Stop   04/07/19 1700  ceFAZolin (ANCEF) IVPB 2g/100 mL premix     2 g 200 mL/hr over 30 Minutes Intravenous Every 8 hours 04/07/19 1330 04/07/19 1819   04/07/19 0745  ceFAZolin (ANCEF) IVPB 2g/100 mL premix     2 g 200 mL/hr over 30 Minutes Intravenous On call to O.R. 04/07/19 2426 04/07/19 0912      Assessment/Plan: s/p Procedure(s): REPAIR OF DUODENAL FISTULA (N/A)  D/c NGT, sips of clears today.   A fib/flutter - restart amiodarone.  On eliquis  at home.  Full dose lovenox today.  Would plan restarting eliquis closer to discharge AKI on CKD - creatinine improved. Hyperkalemia - resolved.   DM - holding metformin, ssi while in house.   HTN - metoprolol IV.  Will plan to restart norvasc once ngt out.   CAD - restart ASA later.  On beta blockade.   ABL anemia - will follow.     LOS: 2 days    [name] 04/09/2019

## 2019-04-10 LAB — GLUCOSE, CAPILLARY
Glucose-Capillary: 113 mg/dL — ABNORMAL HIGH (ref 70–99)
Glucose-Capillary: 116 mg/dL — ABNORMAL HIGH (ref 70–99)
Glucose-Capillary: 120 mg/dL — ABNORMAL HIGH (ref 70–99)
Glucose-Capillary: 142 mg/dL — ABNORMAL HIGH (ref 70–99)
Glucose-Capillary: 144 mg/dL — ABNORMAL HIGH (ref 70–99)
Glucose-Capillary: 165 mg/dL — ABNORMAL HIGH (ref 70–99)

## 2019-04-10 MED ORDER — METOPROLOL TARTRATE 5 MG/5ML IV SOLN
5.0000 mg | Freq: Four times a day (QID) | INTRAVENOUS | Status: DC | PRN
Start: 2019-04-10 — End: 2019-04-13

## 2019-04-10 MED ORDER — POLYETHYLENE GLYCOL 3350 17 G PO PACK
17.0000 g | Freq: Every day | ORAL | Status: DC
Start: 2019-04-10 — End: 2019-04-13
  Administered 2019-04-10 – 2019-04-13 (×4): 17 g via ORAL
  Filled 2019-04-10 (×3): qty 1

## 2019-04-10 MED ORDER — POLYETHYLENE GLYCOL 3350 17 G PO PACK
17.0000 g | Freq: Every day | ORAL | Status: DC
Start: 2019-04-11 — End: 2019-04-10
  Filled 2019-04-10: qty 1

## 2019-04-10 MED ORDER — METOPROLOL TARTRATE 25 MG PO TABS
25.0000 mg | Freq: Two times a day (BID) | ORAL | Status: DC
Start: 2019-04-10 — End: 2019-04-13
  Administered 2019-04-10 – 2019-04-13 (×6): 25 mg via ORAL
  Filled 2019-04-10 (×6): qty 1

## 2019-04-10 MED ORDER — AMLODIPINE BESYLATE 5 MG PO TABS
5.0000 mg | Freq: Every day | ORAL | Status: DC
Start: 2019-04-10 — End: 2019-04-13
  Administered 2019-04-11 – 2019-04-13 (×3): 5 mg via ORAL
  Filled 2019-04-10 (×3): qty 1

## 2019-04-10 NOTE — Plan of Care (Signed)

## 2019-04-10 NOTE — Progress Notes (Signed)
3 Days Post-Op   Subjective/Chief Complaint: Sore, but just got back from walk.  No n/v.  Feels better with NGT out.  Working with IS.   Objective: Vital signs in last 24 hours: Temp:  [98 F (36.7 C)-98.5 F (36.9 C)] 98.3 F (36.8 C) (12/11 0420) Pulse Rate:  [79-90] 79 (12/11 0420) Resp:  [7-18] 16 (12/11 0420) BP: (115-153)/(60-77) 115/60 (12/11 0420) SpO2:  [95 %-99 %] 99 % (12/11 0420) Last BM Date: 04/08/19  Intake/Output from previous day: 12/10 0701 - 12/11 0700 In: 2761.1 [P.O.:50; I.V.:2711.1] Out: 395 [Urine:350; Drains:45] Intake/Output this shift: No intake/output data recorded.  General appearance: alert, cooperative and no distress.  OOB in chair. Resp: breathing comfortably GI: soft, approp tender.  some dried blood around drain site.  serosanguinous output in right blake drain.   Extremities: extremities normal, atraumatic, no cyanosis or edema  Lab Results:  Recent Labs    04/08/19 0240 04/09/19 0250  WBC 14.5* 12.0*  HGB 11.1* 9.4*  HCT 33.3* 29.2*  PLT 210 156   BMET Recent Labs    04/08/19 0240 04/09/19 0957  NA 134* 133*  K 5.5* 4.7  CL 101 103  CO2 21* 22  GLUCOSE 156* 151*  BUN 22 19  CREATININE 1.87* 1.31*  CALCIUM 8.7* 8.6*   PT/INR Recent Labs    04/08/19 0240  LABPROT 13.6  INR 1.1   ABG No results for input(s): PHART, HCO3 in the last 72 hours.  Invalid input(s): PCO2, PO2  Studies/Results: No results found.  Anti-infectives: Anti-infectives (From admission, onward)   Start     Dose/Rate Route Frequency Ordered Stop   04/07/19 1700  ceFAZolin (ANCEF) IVPB 2g/100 mL premix     2 g 200 mL/hr over 30 Minutes Intravenous Every 8 hours 04/07/19 1330 04/07/19 1819   04/07/19 0745  ceFAZolin (ANCEF) IVPB 2g/100 mL premix     2 g 200 mL/hr over 30 Minutes Intravenous On call to O.R. 04/07/19 5993 04/07/19 0912      Assessment/Plan: s/p Procedure(s): REPAIR OF DUODENAL FISTULA (N/A)  Clears today, fulls  tomorrow.    A fib/flutter - amiodarone.  On eliquis at home.  Full dose lovenox. Would plan restarting eliquis closer to discharge AKI on CKD - creatinine improved. Hyperkalemia - resolved.   DM - holding metformin, ssi while in house.   HTN - restart metoprolol orally and norvasc.   CAD - restart ASA later.  On beta blockade.   ABL anemia - will follow.   Plan with eventual discharge to go home with drain unless it is putting out nothing.     LOS: 3 days    [name] 04/10/2019

## 2019-04-11 LAB — CBC
HCT: 25.8 % — ABNORMAL LOW (ref 39.0–52.0)
Hemoglobin: 8.6 g/dL — ABNORMAL LOW (ref 13.0–17.0)
MCH: 29.7 pg (ref 26.0–34.0)
MCHC: 33.3 g/dL (ref 30.0–36.0)
MCV: 89 fL (ref 80.0–100.0)
Platelets: 190 K/uL (ref 150–400)
RBC: 2.9 MIL/uL — ABNORMAL LOW (ref 4.22–5.81)
RDW: 16.2 % — ABNORMAL HIGH (ref 11.5–15.5)
WBC: 6.7 K/uL (ref 4.0–10.5)
nRBC: 0 % (ref 0.0–0.2)

## 2019-04-11 LAB — GLUCOSE, CAPILLARY
Glucose-Capillary: 103 mg/dL — ABNORMAL HIGH (ref 70–99)
Glucose-Capillary: 110 mg/dL — ABNORMAL HIGH (ref 70–99)
Glucose-Capillary: 117 mg/dL — ABNORMAL HIGH (ref 70–99)
Glucose-Capillary: 130 mg/dL — ABNORMAL HIGH (ref 70–99)
Glucose-Capillary: 141 mg/dL — ABNORMAL HIGH (ref 70–99)
Glucose-Capillary: 141 mg/dL — ABNORMAL HIGH (ref 70–99)
Glucose-Capillary: 154 mg/dL — ABNORMAL HIGH (ref 70–99)

## 2019-04-11 LAB — COMPREHENSIVE METABOLIC PANEL
ALT: 19 U/L (ref 0–44)
AST: 18 U/L (ref 15–41)
Albumin: 2.4 g/dL — ABNORMAL LOW (ref 3.5–5.0)
Alkaline Phosphatase: 53 U/L (ref 38–126)
Anion gap: 11 (ref 5–15)
BUN: 9 mg/dL (ref 8–23)
CO2: 24 mmol/L (ref 22–32)
Calcium: 8.4 mg/dL — ABNORMAL LOW (ref 8.9–10.3)
Chloride: 100 mmol/L (ref 98–111)
Creatinine, Ser: 1.13 mg/dL (ref 0.61–1.24)
GFR calc Af Amer: >60 mL/min (ref >60)
GFR calc non Af Amer: >60 mL/min (ref >60)
Glucose, Bld: 128 mg/dL — ABNORMAL HIGH (ref 70–99)
Potassium: 3.7 mmol/L (ref 3.5–5.1)
Sodium: 135 mmol/L (ref 135–145)
Total Bilirubin: 1.4 mg/dL — ABNORMAL HIGH (ref 0.3–1.2)
Total Protein: 5.2 g/dL — ABNORMAL LOW (ref 6.5–8.1)

## 2019-04-11 NOTE — Progress Notes (Signed)
Patient ID: [name], male   DOB: March 04, 1943, 73 y.o.   MRN: [id number] [facility] Progress Note:   4 Days Post-Op  Subjective: Mental status is mildly confused but he gave me his entire history Objective: Vital signs in last 24 hours: Temp:  [97.6 F (36.4 C)-98.5 F (36.9 C)] 97.6 F (36.4 C) (12/12 0521) Pulse Rate:  [73-81] 73 (12/12 0521) BP: (118)/(106) 118/106 (12/12 0521) SpO2:  [97 %] 97 % (12/12 0521)  Intake/Output from previous day: 12/11 0701 - 12/12 0700 In: 926 [P.O.:240; I.V.:686] Out: 505 [Urine:450; Drains:55] Intake/Output this shift: No intake/output data recorded.  Physical Exam: Work of breathing is not labored.  Midline incision is not red-staples in place.  JP is mostly bloodly  Lab Results:  Results for orders placed or performed during the hospital encounter of 04/07/19 (from the past 48 hour(s))  Basic metabolic panel     Status: Abnormal   Collection Time: 04/09/19  9:57 AM  Result Value Ref Range   Sodium 133 (L) 135 - 145 mmol/L   Potassium 4.7 3.5 - 5.1 mmol/L   Chloride 103 98 - 111 mmol/L   CO2 22 22 - 32 mmol/L   Glucose, Bld 151 (H) 70 - 99 mg/dL   BUN 19 8 - 23 mg/dL   Creatinine, Ser 1.31 (H) 0.61 - 1.24 mg/dL   Calcium 8.6 (L) 8.9 - 10.3 mg/dL   GFR calc non Af Amer 52 (L) >60 mL/min   GFR calc Af Amer >60 >60 mL/min   Anion gap 8 5 - 15    Comment: Performed at [facility], [address], [place], [place] [address]  Glucose, capillary     Status: Abnormal   Collection Time: 04/09/19 12:01 PM  Result Value Ref Range   Glucose-Capillary 131 (H) 70 - 99 mg/dL  Glucose, capillary     Status: Abnormal   Collection Time: 04/09/19  4:45 PM  Result Value Ref Range   Glucose-Capillary 137 (H) 70 - 99 mg/dL  Glucose, capillary     Status: Abnormal   Collection Time: 04/09/19  8:51 PM  Result Value Ref Range   Glucose-Capillary 116 (H) 70 - 99 mg/dL  Glucose, capillary     Status: Abnormal   Collection Time:  04/10/19 12:37 AM  Result Value Ref Range   Glucose-Capillary 116 (H) 70 - 99 mg/dL  Glucose, capillary     Status: Abnormal   Collection Time: 04/10/19  4:17 AM  Result Value Ref Range   Glucose-Capillary 144 (H) 70 - 99 mg/dL  Glucose, capillary     Status: Abnormal   Collection Time: 04/10/19  8:09 AM  Result Value Ref Range   Glucose-Capillary 120 (H) 70 - 99 mg/dL  Glucose, capillary     Status: Abnormal   Collection Time: 04/10/19 12:43 PM  Result Value Ref Range   Glucose-Capillary 142 (H) 70 - 99 mg/dL  Glucose, capillary     Status: Abnormal   Collection Time: 04/10/19  4:34 PM  Result Value Ref Range   Glucose-Capillary 113 (H) 70 - 99 mg/dL  Glucose, capillary     Status: Abnormal   Collection Time: 04/10/19  8:07 PM  Result Value Ref Range   Glucose-Capillary 165 (H) 70 - 99 mg/dL  Glucose, capillary     Status: Abnormal   Collection Time: 04/11/19 12:16 AM  Result Value Ref Range   Glucose-Capillary 117 (H) 70 - 99 mg/dL  CBC  Status: Abnormal   Collection Time: 04/11/19  3:26 AM  Result Value Ref Range   WBC 6.7 4.0 - 10.5 K/uL   RBC 2.90 (L) 4.22 - 5.81 MIL/uL   Hemoglobin 8.6 (L) 13.0 - 17.0 g/dL   HCT 19.125.8 (L) 47.839.0 - 29.552.0 %   MCV 89.0 80.0 - 100.0 fL   MCH 29.7 26.0 - 34.0 pg   MCHC 33.3 30.0 - 36.0 g/dL   RDW 62.116.2 (H) 30.811.5 - 65.715.5 %   Platelets 190 150 - 400 K/uL   nRBC 0.0 0.0 - 0.2 %    Comment: Performed at [facility], [address] [place], [place] [address]  Comprehensive metabolic panel     Status: Abnormal   Collection Time: 04/11/19  3:26 AM  Result Value Ref Range   Sodium 135 135 - 145 mmol/L   Potassium 3.7 3.5 - 5.1 mmol/L   Chloride 100 98 - 111 mmol/L   CO2 24 22 - 32 mmol/L   Glucose, Bld 128 (H) 70 - 99 mg/dL   BUN 9 8 - 23 mg/dL   Creatinine, Ser 2.951.13 0.61 - 1.24 mg/dL   Calcium 8.4 (L) 8.9 - 10.3 mg/dL   Total Protein 5.2 (L) 6.5 - 8.1 g/dL   Albumin 2.4 (L) 3.5 - 5.0 g/dL   AST 18 15 - 41 U/L   ALT 19 0 - 44 U/L    Alkaline Phosphatase 53 38 - 126 U/L   Total Bilirubin 1.4 (H) 0.3 - 1.2 mg/dL   GFR calc non Af Amer >60 >60 mL/min   GFR calc Af Amer >60 >60 mL/min   Anion gap 11 5 - 15    Comment: Performed at [facility], [address] [place], [place] [address]  Glucose, capillary     Status: Abnormal   Collection Time: 04/11/19  4:37 AM  Result Value Ref Range   Glucose-Capillary 130 (H) 70 - 99 mg/dL  Glucose, capillary     Status: Abnormal   Collection Time: 04/11/19  8:04 AM  Result Value Ref Range   Glucose-Capillary 154 (H) 70 - 99 mg/dL    Radiology/Results: No results found.  Anti-infectives: Anti-infectives (From admission, onward)   Start     Dose/Rate Route Frequency Ordered Stop   04/07/19 1700  ceFAZolin (ANCEF) IVPB 2g/100 mL premix     2 g 200 mL/hr over 30 Minutes Intravenous Every 8 hours 04/07/19 1330 04/07/19 1819   04/07/19 0745  ceFAZolin (ANCEF) IVPB 2g/100 mL premix     2 g 200 mL/hr over 30 Minutes Intravenous On call to O.R. 04/07/19 0743 04/07/19 0912      Assessment/Plan: Problem List: Patient Active Problem List   Diagnosis Date Noted  . 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 AKI (acute kidney injury) (HCC) 11/01/2018  . RBBB 11/01/2018  . Abscess, intra-abdominal, postoperative 10/31/2018  . Acute gangrenous cholecystitis 05/12/2018  . Protein-calorie malnutrition, severe 05/10/2018  . Liver abscess 05/09/2018  . Acute diverticulitis 05/09/2018  . Atrial flutter (HCC) 05/01/2018  . Anemia 05/01/2018  . Non-insulin dependent type 2 diabetes mellitus (HCC) 05/01/2018  . Coronary artery disease 03/31/2018  . Hx of CABG   . Essential hypertension 02/25/2018  . Hyperlipidemia 02/25/2018    Post repair duodenal fistula;  Has progressed to full liquids.  Not ready for discharge yet.   4 Days Post-Op    LOS: 4 days   Matt B. Daphine DeutscherMartin, MD, [facility]  [facility] [phone]  beeper [phone]  04/11/2019 8:06 AM

## 2019-04-12 LAB — CBC
HCT: 25.7 % — ABNORMAL LOW (ref 39.0–52.0)
Hemoglobin: 8.4 g/dL — ABNORMAL LOW (ref 13.0–17.0)
MCH: 29.2 pg (ref 26.0–34.0)
MCHC: 32.7 g/dL (ref 30.0–36.0)
MCV: 89.2 fL (ref 80.0–100.0)
Platelets: 206 K/uL (ref 150–400)
RBC: 2.88 MIL/uL — ABNORMAL LOW (ref 4.22–5.81)
RDW: 16 % — ABNORMAL HIGH (ref 11.5–15.5)
WBC: 5.9 K/uL (ref 4.0–10.5)
nRBC: 0 % (ref 0.0–0.2)

## 2019-04-12 LAB — GLUCOSE, CAPILLARY
Glucose-Capillary: 124 mg/dL — ABNORMAL HIGH (ref 70–99)
Glucose-Capillary: 148 mg/dL — ABNORMAL HIGH (ref 70–99)
Glucose-Capillary: 110 mg/dL — ABNORMAL HIGH (ref 70–99)
Glucose-Capillary: 133 mg/dL — ABNORMAL HIGH (ref 70–99)
Glucose-Capillary: 144 mg/dL — ABNORMAL HIGH (ref 70–99)

## 2019-04-12 NOTE — Progress Notes (Signed)
ANTICOAGULATION CONSULT NOTE - Follow Up Consult  Pharmacy Consult for Lovenox Indication: atrial fibrillation  Allergies  Allergen Reactions  . Ciprofloxacin Rash  . Erythromycin Ethylsuccinate Rash    Patient Measurements: Height: 5\' 11"  (180.3 cm) Weight: 240 lb (108.9 kg) IBW/kg (Calculated) : 75.3   Vital Signs: Temp: 98.4 F (36.9 C) (12/13 0434) Temp Source: Oral (12/13 0434) BP: 148/79 (12/13 0434) Pulse Rate: 81 (12/13 0434)  Labs: Recent Labs    04/09/19 0957 04/11/19 0326 04/12/19 0242  HGB  --  8.6* 8.4*  HCT  --  25.8* 25.7*  PLT  --  190 206  CREATININE 1.31* 1.13  --     Estimated Creatinine Clearance: 68.7 mL/min (by C-G formula based on SCr of 1.13 mg/dL).  Assessment: 39 yoM admitted for duodenal fistula repair. Pharmacy consulted to dose Lovenox for Afib while Eliquis on hold post surgery.  CBC is low but stable, Hgb 8.4, Plt 206. No bleeding noted.  Goal of Therapy:  Monitor platelets by anticoagulation protocol: Yes   Plan:  Continue enoxaparin 105 mg subq q12h Watch CBC F/u resuming Eliquis prior to discharge  Berenice Bouton, PharmD PGY1 Pharmacy Resident Phone until 3:30 pm: [phone] 04/12/2019,9:05 AM

## 2019-04-12 NOTE — Progress Notes (Signed)
Patient ID: [name], male   DOB: Mar 31, 1943, 73 y.o.   MRN: [id number] [facility] Progress Note:   5 Days Post-Op  Subjective: Mental status is fairly clear;  Not ready to go home today Objective: Vital signs in last 24 hours: Temp:  [98.4 F (36.9 C)-98.9 F (37.2 C)] 98.4 F (36.9 C) (12/13 0434) Pulse Rate:  [71-84] 81 (12/13 0434) Resp:  [17-18] 18 (12/13 0434) BP: (143-158)/(78-80) 148/79 (12/13 0434) SpO2:  [90 %-97 %] 97 % (12/13 0434)  Intake/Output from previous day: 12/12 0701 - 12/13 0700 In: 400 [I.V.:400] Out: 110 [Drains:110] Intake/Output this shift: No intake/output data recorded.  Physical Exam: Work of breathing is normal at rest;  JP minimal bloody.  Incision intact and bland with staples  Lab Results:  Results for orders placed or performed during the hospital encounter of 04/07/19 (from the past 48 hour(s))  Glucose, capillary     Status: Abnormal   Collection Time: 04/10/19 12:43 PM  Result Value Ref Range   Glucose-Capillary 142 (H) 70 - 99 mg/dL  Glucose, capillary     Status: Abnormal   Collection Time: 04/10/19  4:34 PM  Result Value Ref Range   Glucose-Capillary 113 (H) 70 - 99 mg/dL  Glucose, capillary     Status: Abnormal   Collection Time: 04/10/19  8:07 PM  Result Value Ref Range   Glucose-Capillary 165 (H) 70 - 99 mg/dL  Glucose, capillary     Status: Abnormal   Collection Time: 04/11/19 12:16 AM  Result Value Ref Range   Glucose-Capillary 117 (H) 70 - 99 mg/dL  CBC     Status: Abnormal   Collection Time: 04/11/19  3:26 AM  Result Value Ref Range   WBC 6.7 4.0 - 10.5 K/uL   RBC 2.90 (L) 4.22 - 5.81 MIL/uL   Hemoglobin 8.6 (L) 13.0 - 17.0 g/dL   HCT 25.8 (L) 39.0 - 52.0 %   MCV 89.0 80.0 - 100.0 fL   MCH 29.7 26.0 - 34.0 pg   MCHC 33.3 30.0 - 36.0 g/dL   RDW 16.2 (H) 11.5 - 15.5 %   Platelets 190 150 - 400 K/uL   nRBC 0.0 0.0 - 0.2 %    Comment: Performed at [facility], [address], [place],  [place] [address]  Comprehensive metabolic panel     Status: Abnormal   Collection Time: 04/11/19  3:26 AM  Result Value Ref Range   Sodium 135 135 - 145 mmol/L   Potassium 3.7 3.5 - 5.1 mmol/L   Chloride 100 98 - 111 mmol/L   CO2 24 22 - 32 mmol/L   Glucose, Bld 128 (H) 70 - 99 mg/dL   BUN 9 8 - 23 mg/dL   Creatinine, Ser 1.13 0.61 - 1.24 mg/dL   Calcium 8.4 (L) 8.9 - 10.3 mg/dL   Total Protein 5.2 (L) 6.5 - 8.1 g/dL   Albumin 2.4 (L) 3.5 - 5.0 g/dL   AST 18 15 - 41 U/L   ALT 19 0 - 44 U/L   Alkaline Phosphatase 53 38 - 126 U/L   Total Bilirubin 1.4 (H) 0.3 - 1.2 mg/dL   GFR calc non Af Amer >60 >60 mL/min   GFR calc Af Amer >60 >60 mL/min   Anion gap 11 5 - 15    Comment: Performed at [facility], [place] [address] [place], [place] [address]  Glucose, capillary     Status: Abnormal   Collection  Time: 04/11/19  4:37 AM  Result Value Ref Range   Glucose-Capillary 130 (H) 70 - 99 mg/dL  Glucose, capillary     Status: Abnormal   Collection Time: 04/11/19  8:04 AM  Result Value Ref Range   Glucose-Capillary 154 (H) 70 - 99 mg/dL  Glucose, capillary     Status: Abnormal   Collection Time: 04/11/19 11:46 AM  Result Value Ref Range   Glucose-Capillary 110 (H) 70 - 99 mg/dL  Glucose, capillary     Status: Abnormal   Collection Time: 04/11/19  4:20 PM  Result Value Ref Range   Glucose-Capillary 141 (H) 70 - 99 mg/dL  Glucose, capillary     Status: Abnormal   Collection Time: 04/11/19  8:06 PM  Result Value Ref Range   Glucose-Capillary 141 (H) 70 - 99 mg/dL  Glucose, capillary     Status: Abnormal   Collection Time: 04/11/19 11:53 PM  Result Value Ref Range   Glucose-Capillary 103 (H) 70 - 99 mg/dL  CBC     Status: Abnormal   Collection Time: 04/12/19  2:42 AM  Result Value Ref Range   WBC 5.9 4.0 - 10.5 K/uL   RBC 2.88 (L) 4.22 - 5.81 MIL/uL   Hemoglobin 8.4 (L) 13.0 - 17.0 g/dL   HCT 22.6 (L) 33.3 - 54.5 %   MCV 89.2 80.0 - 100.0 fL   MCH 29.2 26.0 - 34.0 pg   MCHC 32.7  30.0 - 36.0 g/dL   RDW 62.5 (H) 63.8 - 93.7 %   Platelets 206 150 - 400 K/uL   nRBC 0.0 0.0 - 0.2 %    Comment: Performed at [facility], [address], [place], [place] [address]  Glucose, capillary     Status: Abnormal   Collection Time: 04/12/19  4:31 AM  Result Value Ref Range   Glucose-Capillary 124 (H) 70 - 99 mg/dL  Glucose, capillary     Status: Abnormal   Collection Time: 04/12/19  7:35 AM  Result Value Ref Range   Glucose-Capillary 148 (H) 70 - 99 mg/dL    Radiology/Results: No results found.  Anti-infectives: Anti-infectives (From admission, onward)   Start     Dose/Rate Route Frequency Ordered Stop   04/07/19 1700  ceFAZolin (ANCEF) IVPB 2g/100 mL premix     2 g 200 mL/hr over 30 Minutes Intravenous Every 8 hours 04/07/19 1330 04/07/19 1819   04/07/19 0745  ceFAZolin (ANCEF) IVPB 2g/100 mL premix     2 g 200 mL/hr over 30 Minutes Intravenous On call to O.R. 04/07/19 0743 04/07/19 0912      Assessment/Plan: Problem List: Patient Active Problem List   Diagnosis Date Noted  . 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 AKI (acute kidney injury) (HCC) 11/01/2018  . RBBB 11/01/2018  . Abscess, intra-abdominal, postoperative 10/31/2018  . Acute gangrenous cholecystitis 05/12/2018  . Protein-calorie malnutrition, severe 05/10/2018  . Liver abscess 05/09/2018  . Acute diverticulitis 05/09/2018  . Atrial flutter (HCC) 05/01/2018  . Anemia 05/01/2018  . Non-insulin dependent type 2 diabetes mellitus (HCC) 05/01/2018  . Coronary artery disease 03/31/2018  . Hx of CABG   . Essential hypertension 02/25/2018  . Hyperlipidemia 02/25/2018    Taking liquids and could probably go home on liquids tomorrow.   5 Days Post-Op    LOS: 5 days   Matt B. Daphine Deutscher, MD, [facility] [phone] beeper [phone]  04/12/2019 8:09 AM

## 2019-04-13 LAB — CBC
HCT: 25.7 % — ABNORMAL LOW (ref 39.0–52.0)
Hemoglobin: 8.4 g/dL — ABNORMAL LOW (ref 13.0–17.0)
MCH: 29.5 pg (ref 26.0–34.0)
MCHC: 32.7 g/dL (ref 30.0–36.0)
MCV: 90.2 fL (ref 80.0–100.0)
Platelets: 245 K/uL (ref 150–400)
RBC: 2.85 MIL/uL — ABNORMAL LOW (ref 4.22–5.81)
RDW: 16.3 % — ABNORMAL HIGH (ref 11.5–15.5)
WBC: 6.1 K/uL (ref 4.0–10.5)
nRBC: 0 % (ref 0.0–0.2)

## 2019-04-13 LAB — GLUCOSE, CAPILLARY
Glucose-Capillary: 104 mg/dL — ABNORMAL HIGH (ref 70–99)
Glucose-Capillary: 105 mg/dL — ABNORMAL HIGH (ref 70–99)
Glucose-Capillary: 116 mg/dL — ABNORMAL HIGH (ref 70–99)
Glucose-Capillary: 117 mg/dL — ABNORMAL HIGH (ref 70–99)

## 2019-04-13 MED ORDER — OXYCODONE HCL 5 MG PO TABS
5.0000 mg | Freq: Four times a day (QID) | ORAL | 0 refills | Status: DC | PRN
Start: 2019-04-13 — End: 2019-05-06

## 2019-04-13 NOTE — Discharge Summary (Signed)
Physician Discharge Summary  Patient ID: DEMARCUS THIELKE MRN: [id number] DOB/AGE: 11/26/1942 73 y.o.  Admit date: 04/07/2019 Discharge date: 04/15/2019  Admission Diagnoses: Duodenal fistula. HTN CAD Atrial flutter NIDDM H/o severe acute cholecystitis    Discharge Diagnoses:  Active Problems:   Duodenal fistula and same as above  Discharged Condition: stable  Hospital Course:  Pt was admitted to the floor post op with telemetry.  He had reasonable pain control on POD 1.  He had NGT/NPO for 48 hours.  He had foley d/c'd on POD 2.  His amiodarone was started once NGT removed.  He required full dose lovenox while in house.  He also was placed on SSI while an inpatient.  His diet was slowly advanced.  His drain output was minimal and non-bilious.  The drain was pulled.  Cr 1.13  Consults: None  Significant Diagnostic Studies: labs: HCT prior to d/c 25.7, Cr   Treatments: surgery: see above  Discharge Exam: Blood pressure 137/64, pulse 76, temperature 98.5 F (36.9 C), temperature source Oral, resp. rate 20, height 5\' 11"  (1.803 m), weight 108.9 kg, SpO2 96 %. General appearance: alert and no distress Resp: breathing comfortably Cardio: regular rate and rhythm GI: soft, non distended.  drain old blood Extremities: extremities normal, atraumatic, no cyanosis or edema  Disposition: Discharge disposition: 01-Home or Self Care       Discharge Instructions    Call MD for:  difficulty breathing, headache or visual disturbances   Complete by: As directed    Call MD for:  hives   Complete by: As directed    Call MD for:  persistant nausea and vomiting   Complete by: As directed    Call MD for:  redness, tenderness, or signs of infection (pain, swelling, redness, odor or green/yellow discharge around incision site)   Complete by: As directed    Call MD for:  severe uncontrolled pain   Complete by: As directed    Call MD for:  temperature >100.4   Complete by: As  directed    Diet - low sodium heart healthy   Complete by: As directed    Increase activity slowly   Complete by: As directed      Allergies as of 04/13/2019      Reactions   Ciprofloxacin Rash   Erythromycin Ethylsuccinate Rash      Medication List    TAKE these medications   acetaminophen 500 MG tablet Commonly known as: TYLENOL Take 1,000 mg by mouth daily as needed for moderate pain.   amiodarone 200 MG tablet Commonly known as: PACERONE Take 200 mg by mouth daily.   amiodarone 100 MG tablet Commonly known as: PACERONE Take 1 tablet (100 mg total) by mouth daily.   amLODipine 5 MG tablet Commonly known as: NORVASC Take 5 mg by mouth daily.   aspirin EC 81 MG tablet Take 81 mg by mouth every evening.   cholecalciferol 25 MCG (1000 UT) tablet Commonly known as: VITAMIN D Take 1,000 Units by mouth 2 (two) times daily.   Eliquis 5 MG Tabs tablet Generic drug: apixaban TAKE ONE TABLET BY MOUTH TWICE DAILY What changed: how much to take   finasteride 5 MG tablet Commonly known as: PROSCAR Take 5 mg by mouth daily.   IRON PO Take 325 mg by mouth 2 (two) times daily.   loratadine 10 MG tablet Commonly known as: CLARITIN Take 10 mg by mouth daily.   metFORMIN 1000 MG tablet Commonly known as: GLUCOPHAGE  Take 1,000 mg by mouth 2 (two) times daily.   metoprolol tartrate 25 MG tablet Commonly known as: LOPRESSOR TAKE ONE TABLET BY MOUTH TWICE DAILY   MULTIVITAMIN ADULT PO Take 1 tablet by mouth daily.   oxyCODONE 5 MG immediate release tablet Commonly known as: Oxy IR/ROXICODONE Take 1 tablet (5 mg total) by mouth every 6 (six) hours as needed for severe pain. What changed: Another medication with the same name was added. Make sure you understand how and when to take each.   oxyCODONE 5 MG immediate release tablet Commonly known as: Oxy IR/ROXICODONE Take 1-2 tablets (5-10 mg total) by mouth every 6 (six) hours as needed for moderate pain or severe  pain (5mg  moderate, 10mg  severe). What changed: You were already taking a medication with the same name, and this prescription was added. Make sure you understand how and when to take each.      Follow-up Information    [facility] [place] Follow up in 1 week(s).   Specialty: General Surgery Why: for staple removal Contact information: [address] [place] [address] [phone]        [name], MD Follow up in 2 week(s).   Specialty: General Surgery Why: 2-3 weeks Contact information: [address] [place] [address] [phone]           Signed: [name] 04/15/2019, 3:15 PM

## 2019-04-13 NOTE — Plan of Care (Signed)

## 2019-04-13 NOTE — Progress Notes (Signed)
JP drain removed and dry dressing applied as ordered. Pt tolerated well.  AVS given and reviewed with pt. Medications discussed. All questions answered to satisfaction. Pt verbalized understanding of information given. Pt to be escorted off the unit with all belongings via wheelchair by staff member.

## 2019-04-13 NOTE — Discharge Instructions (Signed)
[facility]      [facility] °[phone] ° °ABDOMINAL SURGERY: POST OP INSTRUCTIONS ° °Always review your discharge instruction sheet given to you by the facility where your surgery was performed. ° °IF YOU HAVE DISABILITY OR FAMILY LEAVE FORMS, YOU MUST BRING THEM TO THE OFFICE FOR PROCESSING.  PLEASE DO NOT GIVE THEM TO YOUR DOCTOR. ° °1. A prescription for pain medication may be given to you upon discharge.  Take your pain medication as prescribed, if needed.  If narcotic pain medicine is not needed, then you may take acetaminophen (Tylenol) or ibuprofen (Advil) as needed. °2. Take your usually prescribed medications unless otherwise directed. °3. If you need a refill on your pain medication, please contact your pharmacy. They will contact our office to request authorization.  Prescriptions will not be filled after 5pm or on week-ends. °4. You should follow a light diet the first few days after arrival home, such as soup and crackers, pudding, etc.unless your doctor has advised otherwise. A high-fiber, low fat diet can be resumed as tolerated.   Be sure to include lots of fluids daily. Most patients will experience some swelling and bruising on the chest and neck area.  Ice packs will help.  Swelling and bruising can take several days to resolve °5. Most patients will experience some swelling and bruising in the area of the incision. Ice pack will help. Swelling and bruising can take several days to resolve..  °6. It is common to experience some constipation if taking pain medication after surgery.  Increasing fluid intake and taking a stool softener will usually help or prevent this problem from occurring.  A mild laxative (Milk of Magnesia or Miralax) should be taken according to package directions if there are no bowel movements after 48 hours. °7.  You may have steri-strips (small skin tapes) in place directly over the incision.  These strips should be left on the skin for 10-14 days.  If your  surgeon used skin glue on the incision, you may shower in 48 hours.  The glue will flake off over the next 2-3 weeks.  Any sutures or staples will be removed at the office during your follow-up visit. You may find that a light gauze bandage over your incision may keep your staples from being rubbed or pulled. You may shower and replace the bandage daily. °8. ACTIVITIES:  You may resume regular (light) daily activities beginning the next day--such as daily self-care, walking, climbing stairs--gradually increasing activities as tolerated.  You may have sexual intercourse when it is comfortable.  Refrain from any heavy lifting or straining until approved by your doctor. °a. You may drive when you no longer are taking prescription pain medication, you can comfortably wear a seatbelt, and you can safely maneuver your car and apply brakes °b. Return to Work: __________8 weeks if applicable_________________________ °9. You should see your doctor in the office for a follow-up appointment approximately two weeks after your surgery.  Make sure that you call for this appointment within a day or two after you arrive home to insure a convenient appointment time. °OTHER INSTRUCTIONS:  °_____________________________________________________________ °_____________________________________________________________ ° °WHEN TO CALL YOUR DOCTOR: °1. Fever over 101.0 °2. Inability to urinate °3. Nausea and/or vomiting °4. Extreme swelling or bruising °5. Continued bleeding from incision. °6. Increased pain, redness, or drainage from the incision. °7. Difficulty swallowing or breathing °8. Muscle cramping or spasms. °9. Numbness or tingling in hands or feet or around lips. ° °The clinic staff is   available to answer your questions during regular business hours.  Please don’t hesitate to call and ask to speak to one of the nurses if you have concerns. ° °For further questions, please visit [email or web address] ° ° ° °

## 2019-04-17 ENCOUNTER — Telehealth: Payer: Self-pay | Admitting: Cardiovascular Disease

## 2019-04-17 NOTE — Telephone Encounter (Signed)
Pt c/o medication issue:  1. Name of Medication: ELIQUIS 5 MG TABS tablet  2. How are you currently taking this medication (dosage and times per day)? Yes, twice a day  3. Are you having a reaction (difficulty breathing--STAT)? no  4. What is your medication issue? Patient would like our office to call his insurance company and ask for a tier reduction for his Eliquis. He says they will reduce the cost of his Eliquis if they are told he needs the medication. The number for the insurance company is: [phone]

## 2019-04-20 NOTE — Telephone Encounter (Addendum)
Tier exception process started with [employer] Await fax

## 2019-04-30 ENCOUNTER — Inpatient Hospital Stay (HOSPITAL_COMMUNITY)
Admission: AD | Admit: 2019-04-30 | Discharge: 2019-05-06 | DRG: 862 | Disposition: A | Discharge status: Home Health | Payer: Medicare HMO | Source: Ambulatory Visit | Attending: General Surgery | Admitting: General Surgery

## 2019-04-30 ENCOUNTER — Other Ambulatory Visit (HOSPITAL_COMMUNITY): Payer: Self-pay | Admitting: General Surgery

## 2019-04-30 ENCOUNTER — Encounter

## 2019-04-30 ENCOUNTER — Other Ambulatory Visit: Payer: Self-pay | Admitting: General Surgery

## 2019-04-30 ENCOUNTER — Other Ambulatory Visit (HOSPITAL_COMMUNITY): Payer: Self-pay

## 2019-04-30 ENCOUNTER — Ambulatory Visit (HOSPITAL_COMMUNITY)
Admission: RE | Admit: 2019-04-30 | Discharge: 2019-04-30 | Disposition: A | Discharge status: Home / Self Care | Payer: Medicare HMO | Source: Ambulatory Visit | Attending: General Surgery | Admitting: General Surgery

## 2019-04-30 ENCOUNTER — Emergency Department (HOSPITAL_COMMUNITY): Admission: EM | Admit: 2019-04-30 | Discharge: 2019-04-30 | Discharge status: Absent without leave | Payer: Medicare HMO

## 2019-04-30 ENCOUNTER — Other Ambulatory Visit: Payer: Self-pay

## 2019-04-30 DIAGNOSIS — I2510 Atherosclerotic heart disease of native coronary artery without angina pectoris: Secondary | ICD-10-CM | POA: Diagnosis present

## 2019-04-30 DIAGNOSIS — B9620 Unspecified Escherichia coli [E. coli] as the cause of diseases classified elsewhere: Secondary | ICD-10-CM | POA: Diagnosis not present

## 2019-04-30 DIAGNOSIS — Z881 Allergy status to other antibiotic agents status: Secondary | ICD-10-CM

## 2019-04-30 DIAGNOSIS — K316 Fistula of stomach and duodenum: Secondary | ICD-10-CM

## 2019-04-30 DIAGNOSIS — Z7984 Long term (current) use of oral hypoglycemic drugs: Secondary | ICD-10-CM | POA: Diagnosis not present

## 2019-04-30 DIAGNOSIS — Z79899 Other long term (current) drug therapy: Secondary | ICD-10-CM | POA: Diagnosis not present

## 2019-04-30 DIAGNOSIS — Z951 Presence of aortocoronary bypass graft: Secondary | ICD-10-CM

## 2019-04-30 DIAGNOSIS — Z9049 Acquired absence of other specified parts of digestive tract: Secondary | ICD-10-CM

## 2019-04-30 DIAGNOSIS — Z87891 Personal history of nicotine dependence: Secondary | ICD-10-CM | POA: Diagnosis not present

## 2019-04-30 DIAGNOSIS — E785 Hyperlipidemia, unspecified: Secondary | ICD-10-CM | POA: Diagnosis present

## 2019-04-30 DIAGNOSIS — N179 Acute kidney failure, unspecified: Secondary | ICD-10-CM | POA: Diagnosis present

## 2019-04-30 DIAGNOSIS — Z7901 Long term (current) use of anticoagulants: Secondary | ICD-10-CM

## 2019-04-30 DIAGNOSIS — Z978 Presence of other specified devices: Secondary | ICD-10-CM | POA: Diagnosis not present

## 2019-04-30 DIAGNOSIS — K651 Peritoneal abscess: Secondary | ICD-10-CM | POA: Insufficient documentation

## 2019-04-30 DIAGNOSIS — Y836 Removal of other organ (partial) (total) as the cause of abnormal reaction of the patient, or of later complication, without mention of misadventure at the time of the procedure: Secondary | ICD-10-CM | POA: Diagnosis present

## 2019-04-30 DIAGNOSIS — Z6832 Body mass index (BMI) 32.0-32.9, adult: Secondary | ICD-10-CM

## 2019-04-30 DIAGNOSIS — I10 Essential (primary) hypertension: Secondary | ICD-10-CM | POA: Diagnosis present

## 2019-04-30 DIAGNOSIS — B961 Klebsiella pneumoniae [K. pneumoniae] as the cause of diseases classified elsewhere: Secondary | ICD-10-CM | POA: Diagnosis not present

## 2019-04-30 DIAGNOSIS — E119 Type 2 diabetes mellitus without complications: Secondary | ICD-10-CM | POA: Diagnosis present

## 2019-04-30 DIAGNOSIS — T8143XA Infection following a procedure, organ and space surgical site, initial encounter: Principal | ICD-10-CM | POA: Diagnosis present

## 2019-04-30 DIAGNOSIS — E43 Unspecified severe protein-calorie malnutrition: Secondary | ICD-10-CM | POA: Diagnosis present

## 2019-04-30 DIAGNOSIS — Z20822 Contact with and (suspected) exposure to covid-19: Secondary | ICD-10-CM | POA: Diagnosis present

## 2019-04-30 DIAGNOSIS — Z823 Family history of stroke: Secondary | ICD-10-CM | POA: Diagnosis not present

## 2019-04-30 DIAGNOSIS — K810 Acute cholecystitis: Secondary | ICD-10-CM | POA: Diagnosis not present

## 2019-04-30 DIAGNOSIS — B957 Other staphylococcus as the cause of diseases classified elsewhere: Secondary | ICD-10-CM | POA: Diagnosis not present

## 2019-04-30 DIAGNOSIS — D649 Anemia, unspecified: Secondary | ICD-10-CM | POA: Diagnosis present

## 2019-04-30 DIAGNOSIS — Z7982 Long term (current) use of aspirin: Secondary | ICD-10-CM | POA: Diagnosis not present

## 2019-04-30 MED ORDER — ONDANSETRON 4 MG PO TBDP
4.0000 mg | Freq: Four times a day (QID) | ORAL | Status: DC | PRN
Start: 2019-04-30 — End: 2019-05-06

## 2019-04-30 MED ORDER — AMIODARONE HCL 200 MG PO TABS
200.0000 mg | Freq: Every day | ORAL | Status: DC
Start: 2019-04-30 — End: 2019-05-06
  Administered 2019-04-30 – 2019-05-06 (×7): 200 mg via ORAL
  Filled 2019-04-30 (×7): qty 1

## 2019-04-30 MED ORDER — PIPERACILLIN-TAZOBACTAM 3.375 G IVPB
3.3750 g | Freq: Three times a day (TID) | INTRAVENOUS | Status: DC
Start: 2019-04-30 — End: 2019-05-06
  Administered 2019-04-30 – 2019-05-06 (×18): 3.375 g via INTRAVENOUS
  Filled 2019-04-30 (×18): qty 50

## 2019-04-30 MED ORDER — IOHEXOL 9 MG/ML PO SOLN
ORAL | Status: AC
Start: 2019-04-30 — End: 2019-04-30
  Filled 2019-04-30: qty 500

## 2019-04-30 MED ORDER — DIPHENHYDRAMINE HCL 50 MG/ML IJ SOLN
12.5000 mg | Freq: Four times a day (QID) | INTRAMUSCULAR | Status: DC | PRN
Start: 2019-04-30 — End: 2019-05-06

## 2019-04-30 MED ORDER — OXYCODONE HCL 5 MG PO TABS
5.0000 mg | Freq: Four times a day (QID) | ORAL | Status: DC | PRN
Start: 2019-04-30 — End: 2019-05-06
  Administered 2019-04-30 – 2019-05-06 (×13): 10 mg via ORAL
  Filled 2019-04-30 (×13): qty 2

## 2019-04-30 MED ORDER — ZOLPIDEM TARTRATE 5 MG PO TABS
5.0000 mg | Freq: Every evening | ORAL | Status: DC | PRN
Start: 2019-04-30 — End: 2019-05-06
  Filled 2019-04-30: qty 1

## 2019-04-30 MED ORDER — HYDROMORPHONE HCL 1 MG/ML IJ SOLN
1.0000 mg | INTRAMUSCULAR | Status: DC | PRN
Start: 2019-04-30 — End: 2019-05-06
  Administered 2019-05-01 – 2019-05-05 (×7): 1 mg via INTRAVENOUS
  Filled 2019-04-30 (×7): qty 1

## 2019-04-30 MED ORDER — PANTOPRAZOLE SODIUM 40 MG IV SOLR
40.0000 mg | Freq: Every day | INTRAVENOUS | Status: DC
Start: 2019-04-30 — End: 2019-05-02
  Administered 2019-04-30 – 2019-05-01 (×2): 40 mg via INTRAVENOUS
  Filled 2019-04-30 (×2): qty 40

## 2019-04-30 MED ORDER — SODIUM CHLORIDE (PF) 0.9 % IJ SOLN
INTRAMUSCULAR | Status: AC
Start: 2019-04-30 — End: 2019-04-30
  Filled 2019-04-30: qty 50

## 2019-04-30 MED ORDER — METOPROLOL TARTRATE 25 MG PO TABS
25.0000 mg | Freq: Two times a day (BID) | ORAL | Status: DC
Start: 2019-04-30 — End: 2019-05-06
  Administered 2019-04-30 – 2019-05-06 (×13): 25 mg via ORAL
  Filled 2019-04-30 (×13): qty 1

## 2019-04-30 MED ORDER — DIPHENHYDRAMINE HCL 12.5 MG/5ML PO ELIX
12.5000 mg | Freq: Four times a day (QID) | ORAL | Status: DC | PRN
Start: 2019-04-30 — End: 2019-05-06

## 2019-04-30 MED ORDER — AMLODIPINE BESYLATE 5 MG PO TABS
5.0000 mg | Freq: Every day | ORAL | Status: DC
Start: 2019-04-30 — End: 2019-05-06
  Administered 2019-04-30 – 2019-05-06 (×7): 5 mg via ORAL
  Filled 2019-04-30 (×7): qty 1

## 2019-04-30 MED ORDER — ONDANSETRON HCL 4 MG/2ML IJ SOLN
4.0000 mg | Freq: Four times a day (QID) | INTRAMUSCULAR | Status: DC | PRN
Start: 2019-04-30 — End: 2019-05-06
  Administered 2019-05-01: 11:00:00 4 mg via INTRAVENOUS
  Filled 2019-04-30: qty 2

## 2019-04-30 MED ORDER — IOHEXOL 300 MG/ML  SOLN
100.0000 mL | Freq: Once | INTRAMUSCULAR | Status: AC | PRN
Start: 2019-04-30 — End: 2019-04-30
  Administered 2019-04-30: 13:00:00 100 mL via INTRAVENOUS

## 2019-04-30 MED ORDER — KCL IN DEXTROSE-NACL 20-5-0.45 MEQ/L-%-% IV SOLN
INTRAVENOUS | Status: DC
Start: 2019-04-30 — End: 2019-05-06
  Filled 2019-04-30 (×8): qty 1000

## 2019-04-30 MED ORDER — ACETAMINOPHEN 500 MG PO TABS
1000.0000 mg | Freq: Every day | ORAL | Status: DC | PRN
Start: 2019-04-30 — End: 2019-05-06

## 2019-04-30 MED ORDER — LORATADINE 10 MG PO TABS
10.0000 mg | Freq: Every day | ORAL | Status: DC
Start: 2019-04-30 — End: 2019-05-06
  Administered 2019-04-30 – 2019-05-06 (×7): 10 mg via ORAL
  Filled 2019-04-30 (×7): qty 1

## 2019-04-30 NOTE — Progress Notes (Signed)
Patient direct admit to 6N28, arrived via wheelchair, AOx4, VSS, ambulatory, oriented to room, call bell, use of bed controls. No complaints of pain at the moment. Seen by Dr Grandville Silos. Will continue to monitor.

## 2019-04-30 NOTE — H&P (Signed)
[name] is an 73 y.o. male.   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 Donell Beers on 12/8.  He was discharged from the hospital on 12/16.  Drains were taken out at that time.  At home, he is having difficulty eating much.  He is drinking water and eating Jell-O but many foods give him nausea and do not taste right.  He started to feel worse and contacted our office yesterday and was sent for labs.  This showed a leukocytosis of 16,000 and evidence of dehydration.  He was sent for CT scanning of the abdomen and pelvis today by Dr. Dwain Sarna in light of the laboratory studies.  He was found to have a right upper quadrant abscess and direct admitted to [facility].  Past Medical History:  Diagnosis Date  . Allergies   . Anemia   . Anxiety   . CAD (coronary artery disease)   . Diabetes (HCC)   . Dyspnea    upon exertion  . Gout   . Hepatitis    at 73 years old  . Hyperlipemia   . Hypertension     Past Surgical History:  Procedure Laterality Date  . CHOLECYSTECTOMY N/A 10/07/2018   Procedure: Laparoscopic Cholecystectomy;  Surgeon: [name], MD;  Location: [facility] OR;  Service: General;  Laterality: N/A;  . CORONARY ARTERY BYPASS GRAFT N/A 03/31/2018   Procedure: CORONARY ARTERY BYPASS GRAFTING (CABG) TIMES FOUR USING LEFT INTERNAL MAMMARY ARTERY AND RIGHT GREATER SAPHENOUS VEIN HARVESTED ENDOSCOPICALLY.;  Surgeon: Kerin Perna, MD;  Location: [facility] OR;  Service: Open Heart Surgery;  Laterality: N/A;  . DUODENAL FISTULA REPAIR  04/07/2019   REPAIR OF DUODENAL FISTULA (N/A Abdomen)  . IR CATHETER TUBE CHANGE  12/09/2018  . IR CATHETER TUBE CHANGE  02/06/2019  . IR EXCHANGE BILIARY DRAIN  06/27/2018  . IR PERC CHOLECYSTOSTOMY  05/11/2018  . IR RADIOLOGIST EVAL & MGMT  06/11/2018  . IR RADIOLOGIST EVAL & MGMT  11/18/2018  . IR RADIOLOGIST EVAL & MGMT  12/03/2018  . LEFT HEART CATH AND CORONARY ANGIOGRAPHY N/A 02/27/2018   Procedure: LEFT  HEART CATH AND CORONARY ANGIOGRAPHY;  Surgeon: Runell Gess, MD;  Location: [facility] INVASIVE CV LAB;  Service: Cardiovascular;  Laterality: N/A;  . liver absess  2010  . TEE WITHOUT CARDIOVERSION N/A 03/31/2018   Procedure: TRANSESOPHAGEAL ECHOCARDIOGRAM (TEE);  Surgeon: Donata Clay, Theron Arista, MD;  Location: [facility] OR;  Service: Open Heart Surgery;  Laterality: N/A;    Family History  Problem Relation Age of Onset  . Atrial fibrillation Mother   . Stroke Mother   . Cancer Maternal Aunt    Social History:  reports that he quit smoking about 39 years ago. His smoking use included cigarettes. He has never used smokeless tobacco. He reports previous alcohol use. He reports that he does not use drugs.  Allergies:  Allergies  Allergen Reactions  . Ciprofloxacin Rash  . Erythromycin Ethylsuccinate Rash    Medications Prior to Admission  Medication Sig Dispense Refill  . acetaminophen (TYLENOL) 500 MG tablet Take 1,000 mg by mouth daily as needed for moderate pain.    Marland Kitchen amiodarone (PACERONE) 100 MG tablet Take 1 tablet (100 mg total) by mouth daily. (Patient not taking: Reported on 03/30/2019) 90 tablet 3  . amiodarone (PACERONE) 200 MG tablet Take 200 mg by mouth daily.    Marland Kitchen amLODipine (NORVASC) 5 MG tablet Take 5 mg by mouth daily.    Marland Kitchen  aspirin EC 81 MG tablet Take 81 mg by mouth every evening.     . cholecalciferol (VITAMIN D) 25 MCG (1000 UT) tablet Take 1,000 Units by mouth 2 (two) times daily.     Marland Kitchen. ELIQUIS 5 MG TABS tablet TAKE ONE TABLET BY MOUTH TWICE DAILY (Patient taking differently: Take 5 mg by mouth 2 (two) times daily. ) 60 tablet 6  . Ferrous Sulfate (IRON PO) Take 325 mg by mouth 2 (two) times daily.     . finasteride (PROSCAR) 5 MG tablet Take 5 mg by mouth daily.    Marland Kitchen. loratadine (CLARITIN) 10 MG tablet Take 10 mg by mouth daily.    . metFORMIN (GLUCOPHAGE) 1000 MG tablet Take 1,000 mg by mouth 2 (two) times daily.    . metoprolol tartrate (LOPRESSOR) 25 MG tablet TAKE ONE TABLET BY  MOUTH TWICE DAILY (Patient taking differently: Take 25 mg by mouth 2 (two) times daily. ) 180 tablet 3  . Multiple Vitamins-Minerals (MULTIVITAMIN ADULT PO) Take 1 tablet by mouth daily.    Marland Kitchen. oxyCODONE (OXY IR/ROXICODONE) 5 MG immediate release tablet Take 1 tablet (5 mg total) by mouth every 6 (six) hours as needed for severe pain. (Patient not taking: Reported on 03/30/2019) 20 tablet 0  . oxyCODONE (OXY IR/ROXICODONE) 5 MG immediate release tablet Take 1-2 tablets (5-10 mg total) by mouth every 6 (six) hours as needed for moderate pain or severe pain (5mg  moderate, 10mg  severe). 40 tablet 0    No results found for this or any previous visit (from the past 48 hour(s)). CT ABDOMEN PELVIS W CONTRAST  Result Date: 04/30/2019 CLINICAL DATA:  History of duodenal fistula and hepatic abscess status post drainage procedures. The drainage catheter was recently removed and patient head duodenal fistula repair 04/07/2019. EXAM: CT ABDOMEN AND PELVIS WITH CONTRAST TECHNIQUE: Multidetector CT imaging of the abdomen and pelvis was performed using the standard protocol following bolus administration of intravenous contrast. CONTRAST:  100mL OMNIPAQUE IOHEXOL 300 MG/ML  SOLN COMPARISON:  CT scan 03/30/2019 FINDINGS: Lower chest: The lung bases are clear of an acute process. No pleural effusions or pulmonary infiltrates. The heart is normal in size. No pericardial effusion. 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 Stable advanced three-vessel coronary artery calcifications. Hepatobiliary: No focal hepatic lesions are identified. No intrahepatic biliary dilatation. Gallbladder is surgically absent. No common bile duct dilatation. Pancreas: No mass, inflammation or ductal dilatation. Spleen: Normal size.  No focal lesions. Adrenals/Urinary Tract: The adrenal glands and kidneys are unremarkable. Small left renal cysts are noted. No worrisome renal lesions or hydronephrosis. There is a small amount of gas  in the bladder possibly related to recent catheterization. Stomach/Bowel: There is moderate lateral wall thickening in the antropyloric region and also involving the proximal duodenum. This may be some type of surgical patch material. There is fairly significant surrounding inflammatory change and unfortunately recurrent abscesses in the gallbladder fossa region. The largest of which measures approximately 5.4 x 4.7 cm. I do not see any definite leaking oral contrast. The small bowel and colon are unremarkable. No obstructive findings. Stable colonic diverticulosis without findings for acute diverticulitis. Vascular/Lymphatic: Stable aortic and branch vessel atherosclerotic calcifications but no aneurysm or dissection. The major venous structures are patent. Enlarged celiac axis and paraduodenal lymph nodes likely inflammatory/infectious. Reproductive: Prostate gland and seminal vesicles are unremarkable. Other: No pelvic mass or free pelvic fluid collections. There is a masslike area of inflammatory change in the omental and peritoneal fat in the  central anterior abdomen. I assume this is related to the right upper quadrant inflammatory process and abscesses but I do not see an obvious connection. Musculoskeletal: No significant bony findings. IMPRESSION: 1. 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 There appears to be significant inflammatory change involving the antropyloric region of the stomach and proximal duodenum but I do not see any obvious leaking oral contrast. 2. 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 3. Diffuse colonic diverticulosis without findings for acute diverticulitis. These results will be called to the ordering clinician or representative by the Radiologist Assistant, and communication documented in the PACS or zVision Dashboard. Electronically  Signed   By: Marijo Sanes M.D.   On: 04/30/2019 13:27    Review of Systems  Constitutional: Positive for appetite change. Negative for fever.  HENT: Negative.   Eyes: Negative.   Respiratory: Negative.   Cardiovascular: Negative.   Gastrointestinal:       See HPI  Endocrine: Negative.   Genitourinary: Negative.   Musculoskeletal: Negative.   Allergic/Immunologic: Negative.   Neurological: Negative.   Hematological: Bruises/bleeds easily.  Psychiatric/Behavioral: Negative.     There were no vitals taken for this visit. Physical Exam  Constitutional: He is oriented to person, place, and time. He appears well-developed and well-nourished.  HENT:  Head: Normocephalic.  Right Ear: External ear normal.  Left Ear: External ear normal.  Nose: Nose normal.  Eyes: Pupils are equal, round, and reactive to light.  Cardiovascular: Normal rate, normal heart sounds and intact distal pulses.  Irregular rhythm  Respiratory: Effort normal and breath sounds normal. No respiratory distress. He has no wheezes.  GI: Soft. He exhibits no distension. There is abdominal tenderness. There is no rebound and no guarding.  Very mild tenderness right upper quadrant, midline incision is healed, old drain site with bandage  Musculoskeletal:        General: No edema. Normal range of motion.     Cervical back: Neck supple.  Neurological: He is alert and oriented to person, place, and time.  Skin: Skin is warm.  Psychiatric: He has a normal mood and affect.     Assessment/Plan Status post repair of duodenal fistula 12/8 now with right upper quadrant abscess - admit to inpatient, start IV Zosyn, IV fluids for dehydration, hold Eliquis, will ask interventional radiology to place a drain.  Allow clears and n.p.o. at midnight.  Zenovia Jarred, MD 04/30/2019, 3:36 PM

## 2019-04-30 NOTE — Progress Notes (Signed)
Referring Physician(s): ZOXWRU,EAVWUByerly,Faera  Supervising Physician: Ruel FavorsShick, Trevor  [facility]  Chief Complaint: Malaise, intermittent abdominal discomfort  Subjective: Patient familiar to IR service from percutaneous cholecystostomy on 05/11/2018 secondary to  perforated acute cholecystitis with associated hepatic abscess.  He is status post cholecystectomy on 10/07/2018.  He underwent gallbladder fossa abscess drain placement on 11/01/2018.  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  He is now admitted with history of malaise, nausea, some intermittent abdominal discomfort.  Follow-up CT abdomen pelvis today reveals recurrent right upper quadrant abscess in the gallbladder fossa region.  He is currently afebrile, labs pending.  Request now received from surgery for repeat image guided drainage of the current gallbladder fossa abscess.  He denies fever, headache, chest pain, worsening dyspnea, cough, back pain, nausea, vomiting or bleeding.  Past Medical History:  Diagnosis Date  . Allergies   . Anemia   . Anxiety   . CAD (coronary artery disease)   . Diabetes (HCC)   . Dyspnea    upon exertion  . Gout   . Hepatitis    at 73 years old  . Hyperlipemia   . Hypertension        Allergies: Ciprofloxacin and Erythromycin ethylsuccinate  Medications: Prior to Admission medications   Medication Sig Start Date End Date Taking? Authorizing Provider  acetaminophen (TYLENOL) 500 MG tablet Take 1,000 mg by mouth daily as needed for moderate pain.    [provider]  amiodarone (PACERONE) 100 MG tablet Take 1 tablet (100 mg total) by mouth daily. Patient not taking: Reported on 03/30/2019 01/20/19   Runell GessBerry, Jonathan J, MD  amiodarone (PACERONE) 200 MG tablet Take 200 mg by mouth daily.    [provider]  amLODipine (NORVASC) 5 MG tablet Take 5 mg by  mouth daily. 02/05/18   [provider]  aspirin EC 81 MG tablet Take 81 mg by mouth every evening.     [provider]  cholecalciferol (VITAMIN D) 25 MCG (1000 UT) tablet Take 1,000 Units by mouth 2 (two) times daily.     [provider]  ELIQUIS 5 MG TABS tablet TAKE ONE TABLET BY MOUTH TWICE DAILY Patient taking differently: Take 5 mg by mouth 2 (two) times daily.  06/03/18   Runell GessBerry, Jonathan J, MD  Ferrous Sulfate (IRON PO) Take 325 mg by mouth 2 (two) times daily.     [provider]  finasteride (PROSCAR) 5 MG tablet Take 5 mg by mouth daily. 02/05/18   [provider]  loratadine (CLARITIN) 10 MG tablet Take 10 mg by mouth daily.    [provider]  metFORMIN (GLUCOPHAGE) 1000 MG tablet Take 1,000 mg by mouth 2 (two) times daily. 02/05/18   [provider]  metoprolol tartrate (LOPRESSOR) 25 MG tablet TAKE ONE TABLET BY MOUTH TWICE DAILY Patient taking differently: Take 25 mg by mouth 2 (two) times daily.  03/02/19   Runell GessBerry, Jonathan J, MD  Multiple Vitamins-Minerals (MULTIVITAMIN ADULT PO) Take 1 tablet by mouth daily.    [provider]  oxyCODONE (OXY IR/ROXICODONE) 5 MG immediate release tablet Take 1 tablet (5 mg total) by mouth every 6 (six) hours as needed for severe pain. Patient not taking: Reported on 03/30/2019 10/07/18   Almond LintByerly, Faera, MD  oxyCODONE (OXY IR/ROXICODONE) 5 MG immediate release tablet Take 1-2 tablets (5-10 mg total) by  mouth every 6 (six) hours as needed for moderate pain or severe pain (5mg  moderate, 10mg  severe). 04/13/19   , MD     Vital Signs: BP 138/67 (BP Location: Left Arm)   Pulse 89   Temp 98.6 F (37 C) (Oral)   Resp 18   Ht 5\' 11"  (1.803 m)   Wt 230 lb (104.3 kg)   SpO2 100%   BMI 32.08 kg/m   Physical Exam awake, alert.  Chest clear to auscultation bilaterally anteriorly.  Heart with regular rate and rhythm at present.  Abdomen soft, positive bowel sounds, mildly  tender right upper quadrant to palpation.  Extremities with full range of motion, not significantly edematous.  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 The drainage catheter was recently removed and patient head duodenal fistula repair 04/07/2019. EXAM: CT ABDOMEN AND PELVIS WITH CONTRAST TECHNIQUE: Multidetector CT imaging of the abdomen and pelvis was performed using the standard protocol following bolus administration of intravenous contrast. CONTRAST:  OMNIPAQUE IOHEXOL 300 MG/ML  SOLN COMPARISON:  CT scan 03/30/2019 FINDINGS: Lower chest: The lung bases are clear of an acute process. No pleural effusions or pulmonary infiltrates. The heart is normal in size. No pericardial effusion. 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 Stable advanced three-vessel coronary artery calcifications. Hepatobiliary: No focal hepatic lesions are identified. No intrahepatic biliary dilatation. Gallbladder is surgically absent. No common bile duct dilatation. Pancreas: No mass, inflammation or ductal dilatation. Spleen: Normal size.  No focal lesions. Adrenals/Urinary Tract: The adrenal glands and kidneys are unremarkable. Small left renal cysts are noted. No worrisome renal lesions or hydronephrosis. There is a small amount of gas in the bladder possibly related to recent catheterization. Stomach/Bowel: There is moderate lateral wall thickening in the antropyloric region and also involving the proximal duodenum. This may be some type of surgical patch material. There is fairly significant surrounding inflammatory change and unfortunately recurrent abscesses in the gallbladder fossa region. The largest of which measures approximately 5.4 x 4.7 cm. I do not see any definite leaking oral contrast. The small bowel and colon are unremarkable. No obstructive findings. Stable colonic  diverticulosis without findings for acute diverticulitis. Vascular/Lymphatic: Stable aortic and branch vessel atherosclerotic calcifications but no aneurysm or dissection. The major venous structures are patent. Enlarged celiac axis and paraduodenal lymph nodes likely inflammatory/infectious. Reproductive: Prostate gland and seminal vesicles are unremarkable. Other: No pelvic mass or free pelvic fluid collections. There is a masslike area of inflammatory change in the omental and peritoneal fat in the central anterior abdomen. I assume this is related to the right upper quadrant inflammatory process and abscesses but I do not see an obvious connection. Musculoskeletal: No significant bony findings. IMPRESSION: 1. 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 There appears to be significant inflammatory change involving the antropyloric region of the stomach and proximal duodenum but I do not see any obvious leaking oral contrast. 2. 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 3. Diffuse colonic diverticulosis without findings for acute diverticulitis. These results will be called to the ordering clinician or representative by the Radiologist Assistant, and communication documented in the PACS or zVision Dashboard. Electronically Signed   By: 14/11/2018 M.D.   On: 04/30/2019 13:27    Labs:  CBC: Recent Labs  04/09/19 0250 04/11/19 0326 04/12/19 0242 04/13/19 0122  WBC 12.0* 6.7 5.9 6.1  HGB 9.4* 8.6* 8.4* 8.4*  HCT 29.2* 25.8* 25.7* 25.7*  PLT 156 190 206 245    COAGS: Recent Labs    05/10/18 0907 10/06/18 0903 04/07/19 0849 04/08/19 0240  INR 1.42 0.9 1.0 1.1    BMP: Recent Labs    04/01/19 1155 04/08/19 0240 04/09/19 0957 04/11/19 0326  NA 138 134* 133* 135  K 4.4 5.5* 4.7 3.7  CL 105 101 103 100  CO2 20* 21* 22 24  GLUCOSE 98  156* 151* 128*  BUN 14 22 19 9   CALCIUM 9.3 8.7* 8.6* 8.4*  CREATININE 1.44* 1.87* 1.31* 1.13  GFRNONAA 47* 34* 52* >60  GFRAA 54* 39* >60 >60    LIVER FUNCTION TESTS: Recent Labs    11/19/18 2010 04/01/19 1155 04/08/19 0240 04/11/19 0326  BILITOT 0.6 0.3 0.8 1.4*  AST 24 27 61* 18  ALT 22 23 54* 19  ALKPHOS 62 63 47 53  PROT 7.5 6.8 5.9* 5.2*  ALBUMIN 4.0 3.8 2.9* 2.4*    Assessment and Plan: Pt s/p percutaneous cholecystostomy on 05/11/2018 secondary to  perforated acute cholecystitis with associated hepatic abscess.  He is status post cholecystectomy on 10/07/2018.  He underwent gallbladder fossa abscess drain placement on 11/01/2018.  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  He is now admitted with history of malaise, nausea, some intermittent abdominal discomfort.  Follow-up CT abdomen pelvis today reveals recurrent right upper quadrant abscess in the gallbladder fossa region.  He is currently afebrile, labs pending.  Request now received from surgery for repeat image guided drainage of the current gallbladder fossa abscess.  Latest imaging studies were reviewed by Dr. Annamaria Boots. Risks and benefits discussed with the patient including bleeding, infection, damage to adjacent structures, bowel perforation/fistula connection, and sepsis.  All of the patient's questions were answered, patient is agreeable to proceed. Consent signed and in chart.  Procedure scheduled for 1/1.  Continue to hold Eliquis until after above procedure.  LABS PENDING  Electronically Signed: D. Rowe Robert, PA-C 04/30/2019, 4:57 PM   I spent a total of 25 minutes at the the patient's bedside AND on the patient's hospital floor or unit, greater than 50% of which was counseling/coordinating care for CT-guided gallbladder fossa abscess drain    Patient ID: [name], male    DOB: February 28, 1943, 73 y.o.   MRN: [id number]

## 2019-05-01 ENCOUNTER — Encounter

## 2019-05-01 ENCOUNTER — Inpatient Hospital Stay (HOSPITAL_COMMUNITY): Payer: Medicare HMO

## 2019-05-01 LAB — BASIC METABOLIC PANEL
Anion gap: 14 (ref 5–15)
BUN: 11 mg/dL (ref 8–23)
CO2: 24 mmol/L (ref 22–32)
Calcium: 9.5 mg/dL (ref 8.9–10.3)
Chloride: 94 mmol/L — ABNORMAL LOW (ref 98–111)
Creatinine, Ser: 1.51 mg/dL — ABNORMAL HIGH (ref 0.61–1.24)
GFR calc Af Amer: 51 mL/min — ABNORMAL LOW (ref >60)
GFR calc non Af Amer: 44 mL/min — ABNORMAL LOW (ref >60)
Glucose, Bld: 125 mg/dL — ABNORMAL HIGH (ref 70–99)
Potassium: 4.5 mmol/L (ref 3.5–5.1)
Sodium: 132 mmol/L — ABNORMAL LOW (ref 135–145)

## 2019-05-01 LAB — CBC
HCT: 28.5 % — ABNORMAL LOW (ref 39.0–52.0)
Hemoglobin: 9.2 g/dL — ABNORMAL LOW (ref 13.0–17.0)
MCH: 27.9 pg (ref 26.0–34.0)
MCHC: 32.3 g/dL (ref 30.0–36.0)
MCV: 86.4 fL (ref 80.0–100.0)
Platelets: 390 K/uL (ref 150–400)
RBC: 3.3 MIL/uL — ABNORMAL LOW (ref 4.22–5.81)
RDW: 14.5 % (ref 11.5–15.5)
WBC: 11.7 K/uL — ABNORMAL HIGH (ref 4.0–10.5)
nRBC: 0 % (ref 0.0–0.2)

## 2019-05-01 LAB — PROTIME-INR
INR: 1.2 (ref 0.8–1.2)
Prothrombin Time: 15.5 seconds — ABNORMAL HIGH (ref 11.4–15.2)

## 2019-05-01 LAB — SARS CORONAVIRUS 2 (TAT 6-24 HRS): SARS Coronavirus 2: NEGATIVE

## 2019-05-01 MED ORDER — MIDAZOLAM HCL 2 MG/2ML IJ SOLN
INTRAMUSCULAR | Status: AC
Start: 2019-05-01 — End: 2019-05-01
  Filled 2019-05-01: qty 6

## 2019-05-01 MED ORDER — SODIUM CHLORIDE 0.9% FLUSH
5.0000 mL | Freq: Three times a day (TID) | INTRAVENOUS | Status: DC
Start: 2019-05-01 — End: 2019-05-06
  Administered 2019-05-01 – 2019-05-06 (×16): 5 mL

## 2019-05-01 MED ORDER — MIDAZOLAM HCL 2 MG/2ML IJ SOLN
INTRAMUSCULAR | Status: AC | PRN
Start: 2019-05-01 — End: 2019-05-01
  Administered 2019-05-01 (×3): 1 mg via INTRAVENOUS

## 2019-05-01 MED ORDER — FENTANYL CITRATE (PF) 100 MCG/2ML IJ SOLN
INTRAMUSCULAR | Status: AC
Start: 2019-05-01 — End: 2019-05-01
  Filled 2019-05-01: qty 4

## 2019-05-01 MED ORDER — FENTANYL CITRATE (PF) 100 MCG/2ML IJ SOLN
INTRAMUSCULAR | Status: AC | PRN
Start: 2019-05-01 — End: 2019-05-01
  Administered 2019-05-01 (×2): 50 ug via INTRAVENOUS

## 2019-05-01 NOTE — Plan of Care (Signed)
  Problem: Health Behavior/Discharge Planning: Goal: Ability to manage health-related needs will improve Outcome: Progressing   

## 2019-05-01 NOTE — Progress Notes (Signed)
Abscess, intra-abdominal, postoperative  Subjective: S/p repair of duodenal fistula, presented yesterday after CT showed RUQ abscess.  No changes overnight  Objective: Vital signs in last 24 hours: Temp:  [98.4 F (36.9 C)-98.9 F (37.2 C)] 98.9 F (37.2 C) (01/01 0540) Pulse Rate:  [74-89] 79 (01/01 0540) Resp:  [18] 18 (12/31 1500) BP: (131-138)/(64-67) 131/65 (01/01 0540) SpO2:  [97 %-100 %] 97 % (01/01 0540) Weight:  [104.3 kg] 104.3 kg (12/31 1500) Last BM Date: 04/30/19  Intake/Output from previous day: 12/31 0701 - 01/01 0700 In: 1697.5 [P.O.:340; I.V.:1307.5; IV Piggyback:50] Out: -  Intake/Output this shift: No intake/output data recorded.  General appearance: alert and cooperative GI: soft, non-distended  Lab Results:  Results for orders placed or performed during the hospital encounter of 04/30/19 (from the past 24 hour(s))  SARS CORONAVIRUS 2 (TAT 6-24 HRS) Nasopharyngeal Nasopharyngeal Swab     Status: None   Collection Time: 04/30/19  8:17 PM   Specimen: Nasopharyngeal Swab  Result Value Ref Range   SARS Coronavirus 2 NEGATIVE NEGATIVE  Basic metabolic panel     Status: Abnormal   Collection Time: 05/01/19  2:22 AM  Result Value Ref Range   Sodium 132 (L) 135 - 145 mmol/L   Potassium 4.5 3.5 - 5.1 mmol/L   Chloride 94 (L) 98 - 111 mmol/L   CO2 24 22 - 32 mmol/L   Glucose, Bld 125 (H) 70 - 99 mg/dL   BUN 11 8 - 23 mg/dL   Creatinine, Ser 5.83 (H) 0.61 - 1.24 mg/dL   Calcium 9.5 8.9 - 09.4 mg/dL   GFR calc non Af Amer 44 (L) >60 mL/min   GFR calc Af Amer 51 (L) >60 mL/min   Anion gap 14 5 - 15  CBC     Status: Abnormal   Collection Time: 05/01/19  2:22 AM  Result Value Ref Range   WBC 11.7 (H) 4.0 - 10.5 K/uL   RBC 3.30 (L) 4.22 - 5.81 MIL/uL   Hemoglobin 9.2 (L) 13.0 - 17.0 g/dL   HCT 07.6 (L) 80.8 - 81.1 %   MCV 86.4 80.0 - 100.0 fL   MCH 27.9 26.0 - 34.0 pg   MCHC 32.3 30.0 - 36.0 g/dL   RDW 03.1 59.4 - 58.5 %   Platelets 390 150 - 400 K/uL    nRBC 0.0 0.0 - 0.2 %  Protime-INR     Status: Abnormal   Collection Time: 05/01/19  2:22 AM  Result Value Ref Range   Prothrombin Time 15.5 (H) 11.4 - 15.2 seconds   INR 1.2 0.8 - 1.2     Studies/Results Radiology     MEDS, Scheduled . amiodarone  200 mg Oral Daily  . amLODipine  5 mg Oral Daily  . loratadine  10 mg Oral Daily  . metoprolol tartrate  25 mg Oral BID  . pantoprazole (PROTONIX) IV  40 mg Intravenous QHS     Assessment: Abscess, intra-abdominal, postoperative   Plan: IR drain today   LOS: 1 day    Vanita Panda, MD [facility], [place]    05/01/2019 8:29 AM

## 2019-05-01 NOTE — Procedures (Signed)
GB fossa abscess  S/p CT drain  150cc pus asp, cx sent No comp Stable ebl min Full report in pacs

## 2019-05-02 LAB — GLUCOSE, CAPILLARY: Glucose-Capillary: 140 mg/dL — ABNORMAL HIGH (ref 70–99)

## 2019-05-02 MED ORDER — PANTOPRAZOLE SODIUM 40 MG PO TBEC
40.0000 mg | Freq: Every day | ORAL | Status: DC
Start: 2019-05-02 — End: 2019-05-06
  Administered 2019-05-02 – 2019-05-05 (×4): 40 mg via ORAL
  Filled 2019-05-02 (×4): qty 1

## 2019-05-02 NOTE — Progress Notes (Signed)
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   IR drain placed 1/1 with purulent return.  Cxs pending.  No issues overnight, feels dehydrated, tolerating clears  Objective: Vital signs in last 24 hours: Temp:  [97.4 F (36.3 C)-98.2 F (36.8 C)] 97.4 F (36.3 C) (01/02 0600) Pulse Rate:  [64-88] 64 (01/02 0600) Resp:  [11-17] 17 (01/02 0600) BP: (117-143)/(56-73) 128/61 (01/02 0600) SpO2:  [93 %-100 %] 96 % (01/02 0600) Last BM Date: 04/30/19  Intake/Output from previous day: 01/01 0701 - 01/02 0700 In: 2996.9 [P.O.:940; I.V.:1946.6; IV Piggyback:100.3] Out: 153 [Urine:1; Drains:152] Intake/Output this shift: No intake/output data recorded.  General appearance: alert and cooperative GI: soft, non-distended Drain with serous fluid  Lab Results:  Results for orders placed or performed during the hospital encounter of 04/30/19 (from the past 24 hour(s))  Aerobic/Anaerobic Culture (surgical/deep wound)     Status: None (Preliminary result)   Collection Time: 05/01/19 10:14 AM   Specimen: Abscess  Result Value Ref Range   Specimen Description ABSCESS    Special Requests Normal    Gram Stain      FEW WBC PRESENT, PREDOMINANTLY PMN ABUNDANT GRAM NEGATIVE RODS ABUNDANT GRAM VARIABLE ROD RARE GRAM POSITIVE COCCI Performed at [facility], [address], [place], [place] [address]    Culture PENDING    Report Status PENDING      Studies/Results Radiology     MEDS, Scheduled . amiodarone  200 mg Oral Daily  . amLODipine  5 mg Oral Daily  . loratadine  10 mg Oral Daily  . metoprolol tartrate  25 mg Oral BID  . pantoprazole (PROTONIX) IV  40 mg Intravenous QHS  . sodium chloride flush  5 mL Intracatheter Q8H     Assessment: Abscess, intra-abdominal, postoperative S/p IR drain placement 1/1  Plan: Cont IV abx and IVF's Advance to full liquids Recheck labs  in AM   LOS: 2 days    Vanita Panda, MD [facility], [place]    05/02/2019 7:56 AM

## 2019-05-02 NOTE — Plan of Care (Signed)
  Problem: Health Behavior/Discharge Planning: Goal: Ability to manage health-related needs will improve Outcome: Progressing   

## 2019-05-02 NOTE — Progress Notes (Signed)
Referring Physician(s): [id number],ELFYB  Supervising Physician: Simonne Come  Patient Status:  [facility] - In-pt  Chief Complaint: None  Subjective:  Gallbladder fossa abscess s/p drain placement in IR 05/01/2019 by Dr. Miles Costain. Patient awake and alert sitting in chair with no complaints at this time. Gallbladder fossa drain site c/d/i.   Allergies: Ciprofloxacin and Erythromycin ethylsuccinate  Medications: Prior to Admission medications   Medication Sig Start Date End Date Taking? Authorizing Provider  acetaminophen (TYLENOL) 500 MG tablet Take 1,000 mg by mouth daily as needed for moderate pain.   Yes [provider]  amiodarone (PACERONE) 200 MG tablet Take 200 mg by mouth daily.   Yes [provider]  amLODipine (NORVASC) 5 MG tablet Take 5 mg by mouth daily. 02/05/18  Yes [provider]  aspirin EC 81 MG tablet Take 81 mg by mouth every evening.    Yes [provider]  cholecalciferol (VITAMIN D) 25 MCG (1000 UT) tablet Take 1,000 Units by mouth 2 (two) times daily.    Yes [provider]  ELIQUIS 5 MG TABS tablet TAKE ONE TABLET BY MOUTH TWICE DAILY Patient taking differently: Take 5 mg by mouth 2 (two) times daily.  06/03/18  Yes Runell Gess, MD  Ferrous Sulfate (IRON PO) Take 325 mg by mouth 2 (two) times daily.    Yes [provider]  finasteride (PROSCAR) 5 MG tablet Take 5 mg by mouth daily. 02/05/18  Yes [provider]  loratadine (CLARITIN) 10 MG tablet Take 10 mg by mouth daily.   Yes [provider]  metFORMIN (GLUCOPHAGE) 1000 MG tablet Take 1,000 mg by mouth 2 (two) times daily. 02/05/18  Yes [provider]  metoprolol tartrate (LOPRESSOR) 25 MG tablet TAKE ONE TABLET BY MOUTH TWICE DAILY Patient taking differently: Take 25 mg by mouth 2 (two) times daily.  03/02/19  Yes Runell Gess, MD  Multiple Vitamins-Minerals (MULTIVITAMIN ADULT PO) Take 1 tablet by mouth daily.   Yes [provider]  oxyCODONE (OXY IR/ROXICODONE) 5 MG immediate release tablet Take 1-2 tablets (5-10 mg total) by mouth every 6 (six) hours as needed for moderate pain or severe pain (5mg  moderate, 10mg  severe). 04/13/19  Yes , MD  amiodarone (PACERONE) 100 MG tablet Take 1 tablet (100 mg total) by mouth daily. Patient not taking: Reported on 03/30/2019 01/20/19   04/01/2019, MD  oxyCODONE (OXY IR/ROXICODONE) 5 MG immediate release tablet Take 1 tablet (5 mg total) by mouth every 6 (six) hours as needed for severe pain. Patient not taking: Reported on 03/30/2019 10/07/18   04/01/2019, MD     Vital Signs: BP 120/63 (BP Location: Left Arm)   Pulse 69   Temp 98.1 F (36.7 C) (Oral)   Resp 17   Ht 5\' 11"  (1.803 m)   Wt 230 lb (104.3 kg)   SpO2 93%   BMI 32.08 kg/m   Physical Exam Vitals and nursing note reviewed.  Constitutional:      General: He is not in acute distress.    Appearance: Normal appearance.  Pulmonary:     Effort: Pulmonary effort is normal. No respiratory distress.  Abdominal:     Comments: Gallbladder fossa drain site without tenderness, erythema, drainage or active bleeding.  Approximately 25 cc of blood-tinged fluid and suction bulb.  Drain flushes/aspirates without resistance.  Skin:    General: Skin is warm and dry.  Neurological:     Mental Status: He is alert  and oriented to person, place, and time.  Psychiatric:        Mood and Affect: Mood normal.        Behavior: Behavior normal.     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 The drainage catheter was recently removed and patient head duodenal fistula repair 04/07/2019. EXAM: CT ABDOMEN AND PELVIS WITH CONTRAST TECHNIQUE: Multidetector CT imaging of the abdomen and pelvis was performed using the standard protocol following bolus administration of intravenous contrast.  CONTRAST:  112mL OMNIPAQUE IOHEXOL 300 MG/ML  SOLN COMPARISON:  CT scan 03/30/2019 FINDINGS: Lower chest: The lung bases are clear of an acute process. No pleural effusions or pulmonary infiltrates. The heart is normal in size. No pericardial effusion. 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 Stable advanced three-vessel coronary artery calcifications. Hepatobiliary: No focal hepatic lesions are identified. No intrahepatic biliary dilatation. Gallbladder is surgically absent. No common bile duct dilatation. Pancreas: No mass, inflammation or ductal dilatation. Spleen: Normal size.  No focal lesions. Adrenals/Urinary Tract: The adrenal glands and kidneys are unremarkable. Small left renal cysts are noted. No worrisome renal lesions or hydronephrosis. There is a small amount of gas in the bladder possibly related to recent catheterization. Stomach/Bowel: There is moderate lateral wall thickening in the antropyloric region and also involving the proximal duodenum. This may be some type of surgical patch material. There is fairly significant surrounding inflammatory change and unfortunately recurrent abscesses in the gallbladder fossa region. The largest of which measures approximately 5.4 x 4.7 cm. I do not see any definite leaking oral contrast. The small bowel and colon are unremarkable. No obstructive findings. Stable colonic diverticulosis without findings for acute diverticulitis. Vascular/Lymphatic: Stable aortic and branch vessel atherosclerotic calcifications but no aneurysm or dissection. The major venous structures are patent. Enlarged celiac axis and paraduodenal lymph nodes likely inflammatory/infectious. Reproductive: Prostate gland and seminal vesicles are unremarkable. Other: No pelvic mass or free pelvic fluid collections. There is a masslike area of inflammatory change in the omental and peritoneal fat in the central anterior abdomen. I assume this is related to the right upper  quadrant inflammatory process and abscesses but I do not see an obvious connection. Musculoskeletal: No significant bony findings. IMPRESSION: 1. 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 There appears to be significant inflammatory change involving the antropyloric region of the stomach and proximal duodenum but I do not see any obvious leaking oral contrast. 2. 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 3. Diffuse colonic diverticulosis without findings for acute diverticulitis. These results will be called to the ordering clinician or representative by the Radiologist Assistant, and communication documented in the PACS or zVision Dashboard. Electronically Signed   By: Marijo Sanes M.D.   On: 04/30/2019 13:27   CT IMAGE GUIDED DRAINAGE BY PERCUTANEOUS CATHETER  Result Date: 05/01/2019 INDICATION: Recurrent gallbladder fossa abscess EXAM: CT-guided gallbladder fossa abscess drain MEDICATIONS: The patient is currently admitted to the hospital and receiving intravenous antibiotics. The antibiotics were administered within an appropriate time frame prior to the initiation of the procedure. ANESTHESIA/SEDATION: Fentanyl 100 mcg IV; Versed 3.0 mg IV Moderate Sedation Time:  13 minutes The patient was continuously monitored during the procedure by the interventional radiology nurse under my direct supervision. COMPLICATIONS: None immediate. PROCEDURE: Informed written consent was obtained from the  patient after a thorough discussion of the procedural risks, benefits and alternatives. All questions were addressed. Maximal Sterile Barrier Technique was utilized including caps, mask, sterile gowns, sterile gloves, sterile drape, hand hygiene and skin antiseptic. A timeout was performed prior to the initiation of the procedure. Previous imaging reviewed. Patient positioned  supine. Noncontrast localization CT performed. The complex gallbladder fossa fluid collection was localized and marked. Under sterile conditions and local anesthesia, an 18 gauge 10 cm access needle was advanced percutaneously through a lower intercostal space into the gallbladder fossa fluid collection. Needle position confirmed with CT. Syringe aspiration yielded purulent fluid. Sample culture. Guidewire inserted followed by tract dilatation to insert a 10 [place] drain. Drain catheter position advanced with the retention loop formed in abscess. Syringe aspiration yielded 100 cc of purulent fluid. Catheter secured with Prolene suture and connected to external suction bulb. Sterile dressing applied. No immediate complication. Patient tolerated the procedure well. IMPRESSION: Successful CT-guided gallbladder fossa abscess drain placement. Electronically Signed   By: Judie Petit.  Shick M.D.   On: 05/01/2019 10:57    Labs:  CBC: Recent Labs    04/11/19 0326 04/12/19 0242 04/13/19 0122 05/01/19 0222  WBC 6.7 5.9 6.1 11.7*  HGB 8.6* 8.4* 8.4* 9.2*  HCT 25.8* 25.7* 25.7* 28.5*  PLT 190 206 245 390    COAGS: Recent Labs    10/06/18 0903 04/07/19 0849 04/08/19 0240 05/01/19 0222  INR 0.9 1.0 1.1 1.2    BMP: Recent Labs    04/08/19 0240 04/09/19 0957 04/11/19 0326 05/01/19 0222  NA 134* 133* 135 132*  K 5.5* 4.7 3.7 4.5  CL 101 103 100 94*  CO2 21* 22 24 24   GLUCOSE 156* 151* 128* 125*  BUN 22 19 9 11   CALCIUM 8.7* 8.6* 8.4* 9.5  CREATININE 1.87* 1.31* 1.13 1.51*  GFRNONAA 34* 52* >60 44*  GFRAA 39* >60 >60 51*    LIVER FUNCTION TESTS: Recent Labs    11/19/18 2010 04/01/19 1155 04/08/19 0240 04/11/19 0326  BILITOT 0.6 0.3 0.8 1.4*  AST 24 27 61* 18  ALT 22 23 54* 19  ALKPHOS 62 63 47 53  PROT 7.5 6.8 5.9* 5.2*  ALBUMIN 4.0 3.8 2.9* 2.4*    Assessment and Plan:  Gallbladder fossa abscess s/p drain placement in IR 05/01/2019 by Dr. 14/12/20. Drain stable. Continue current  drain management. Further plans per [facility]- appreciate and agree with management. IR to follow.  Electronically Signed: 06/29/2019, PA-C 05/02/2019, 2:26 PM   I spent a total of 25 Minutes at the the patient's bedside AND on the patient's hospital floor or unit, greater than 50% of which was counseling/coordinating care for gallbladder fossa abscess s/p drain placement.

## 2019-05-03 LAB — BASIC METABOLIC PANEL
Anion gap: 15 (ref 5–15)
BUN: 7 mg/dL — ABNORMAL LOW (ref 8–23)
CO2: 20 mmol/L — ABNORMAL LOW (ref 22–32)
Calcium: 9 mg/dL (ref 8.9–10.3)
Chloride: 97 mmol/L — ABNORMAL LOW (ref 98–111)
Creatinine, Ser: 1.33 mg/dL — ABNORMAL HIGH (ref 0.61–1.24)
GFR calc Af Amer: 59 mL/min — ABNORMAL LOW (ref >60)
GFR calc non Af Amer: 51 mL/min — ABNORMAL LOW (ref >60)
Glucose, Bld: 134 mg/dL — ABNORMAL HIGH (ref 70–99)
Potassium: 4.9 mmol/L (ref 3.5–5.1)
Sodium: 132 mmol/L — ABNORMAL LOW (ref 135–145)

## 2019-05-03 LAB — CBC
HCT: 29.4 % — ABNORMAL LOW (ref 39.0–52.0)
Hemoglobin: 9 g/dL — ABNORMAL LOW (ref 13.0–17.0)
MCH: 28 pg (ref 26.0–34.0)
MCHC: 30.6 g/dL (ref 30.0–36.0)
MCV: 91.6 fL (ref 80.0–100.0)
Platelets: 419 K/uL — ABNORMAL HIGH (ref 150–400)
RBC: 3.21 MIL/uL — ABNORMAL LOW (ref 4.22–5.81)
RDW: 14.6 % (ref 11.5–15.5)
WBC: 9.2 K/uL (ref 4.0–10.5)
nRBC: 0 % (ref 0.0–0.2)

## 2019-05-03 MED ORDER — ALUM & MAG HYDROXIDE-SIMETH 200-200-20 MG/5ML PO SUSP
30.0000 mL | Freq: Four times a day (QID) | ORAL | Status: DC | PRN
Start: 2019-05-03 — End: 2019-05-06
  Administered 2019-05-03 – 2019-05-05 (×5): 30 mL via ORAL
  Filled 2019-05-03 (×5): qty 30

## 2019-05-03 MED ORDER — APIXABAN 5 MG PO TABS
5.0000 mg | Freq: Two times a day (BID) | ORAL | Status: DC
Start: 2019-05-03 — End: 2019-05-06
  Administered 2019-05-03 – 2019-05-06 (×6): 5 mg via ORAL
  Filled 2019-05-03 (×6): qty 1

## 2019-05-03 NOTE — Progress Notes (Signed)
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   IR drain placed 1/1 with purulent return.  Cxs pending.  No issues overnight, tolerating fulls, passing flatus, c/o rib pain at the drain site  Objective: Vital signs in last 24 hours: Temp:  [98 F (36.7 C)-98.2 F (36.8 C)] 98.2 F (36.8 C) (01/03 0526) Pulse Rate:  [57-63] 57 (01/03 0526) Resp:  [16-17] 16 (01/03 0526) BP: (134-138)/(67-77) 138/77 (01/03 0526) SpO2:  [97 %] 97 % (01/03 0526) Last BM Date: 04/30/19  Intake/Output from previous day: 01/02 0701 - 01/03 0700 In: 1735.4 [P.O.:240; I.V.:1373.9; IV Piggyback:116.5] Out: 30 [Drains:30] Intake/Output this shift: No intake/output data recorded.  General appearance: alert and cooperative GI: soft, non-distended Drain with slightly cloudy SS fluid  Lab Results:  Results for orders placed or performed during the hospital encounter of 04/30/19 (from the past 24 hour(s))  Glucose, capillary     Status: Abnormal   Collection Time: 05/02/19  9:44 PM  Result Value Ref Range   Glucose-Capillary 140 (H) 70 - 99 mg/dL  CBC in AM     Status: Abnormal   Collection Time: 05/03/19  2:15 AM  Result Value Ref Range   WBC 9.2 4.0 - 10.5 K/uL   RBC 3.21 (L) 4.22 - 5.81 MIL/uL   Hemoglobin 9.0 (L) 13.0 - 17.0 g/dL   HCT 09.8 (L) 11.9 - 14.7 %   MCV 91.6 80.0 - 100.0 fL   MCH 28.0 26.0 - 34.0 pg   MCHC 30.6 30.0 - 36.0 g/dL   RDW 82.9 56.2 - 13.0 %   Platelets 419 (H) 150 - 400 K/uL   nRBC 0.0 0.0 - 0.2 %  BMET in AM     Status: Abnormal   Collection Time: 05/03/19  2:15 AM  Result Value Ref Range   Sodium 132 (L) 135 - 145 mmol/L   Potassium 4.9 3.5 - 5.1 mmol/L   Chloride 97 (L) 98 - 111 mmol/L   CO2 20 (L) 22 - 32 mmol/L   Glucose, Bld 134 (H) 70 - 99 mg/dL   BUN 7 (L) 8 - 23 mg/dL   Creatinine, Ser 8.65 (H) 0.61 - 1.24 mg/dL   Calcium 9.0 8.9 - 78.4 mg/dL   GFR calc non Af  Amer 51 (L) >60 mL/min   GFR calc Af Amer 59 (L) >60 mL/min   Anion gap 15 5 - 15     Studies/Results Radiology     MEDS, Scheduled . amiodarone  200 mg Oral Daily  . amLODipine  5 mg Oral Daily  . loratadine  10 mg Oral Daily  . metoprolol tartrate  25 mg Oral BID  . pantoprazole  40 mg Oral QHS  . sodium chloride flush  5 mL Intracatheter Q8H     Assessment: Abscess, intra-abdominal, postoperative S/p IR drain placement 1/1  Plan: Cont IV abx Decrease IVF's Reg diet as tolerated   LOS: 3 days    Vanita Panda, MD [facility], [place]    05/03/2019 8:01 AM

## 2019-05-03 NOTE — Plan of Care (Signed)
  Problem: Health Behavior/Discharge Planning: Goal: Ability to manage health-related needs will improve Outcome: Progressing   

## 2019-05-04 ENCOUNTER — Inpatient Hospital Stay: Payer: Self-pay

## 2019-05-04 ENCOUNTER — Ambulatory Visit (HOSPITAL_COMMUNITY): Payer: Medicare HMO

## 2019-05-04 ENCOUNTER — Encounter

## 2019-05-04 DIAGNOSIS — K651 Peritoneal abscess: Secondary | ICD-10-CM

## 2019-05-04 DIAGNOSIS — B957 Other staphylococcus as the cause of diseases classified elsewhere: Secondary | ICD-10-CM

## 2019-05-04 DIAGNOSIS — T8143XA Infection following a procedure, organ and space surgical site, initial encounter: Principal | ICD-10-CM

## 2019-05-04 DIAGNOSIS — B9620 Unspecified Escherichia coli [E. coli] as the cause of diseases classified elsewhere: Secondary | ICD-10-CM

## 2019-05-04 DIAGNOSIS — Z978 Presence of other specified devices: Secondary | ICD-10-CM

## 2019-05-04 DIAGNOSIS — Z881 Allergy status to other antibiotic agents status: Secondary | ICD-10-CM

## 2019-05-04 DIAGNOSIS — B961 Klebsiella pneumoniae [K. pneumoniae] as the cause of diseases classified elsewhere: Secondary | ICD-10-CM

## 2019-05-04 DIAGNOSIS — Z9049 Acquired absence of other specified parts of digestive tract: Secondary | ICD-10-CM

## 2019-05-04 DIAGNOSIS — Z87891 Personal history of nicotine dependence: Secondary | ICD-10-CM

## 2019-05-04 DIAGNOSIS — Z951 Presence of aortocoronary bypass graft: Secondary | ICD-10-CM

## 2019-05-04 NOTE — Progress Notes (Signed)
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   Subjective:  Pt up in room Gets to restroom and back easily Says drain still sore bur manageable OP blood/tinged serous color  Allergies: Ciprofloxacin and Erythromycin ethylsuccinate  Medications: Prior to Admission medications   Medication Sig Start Date End Date Taking? Authorizing Provider  acetaminophen (TYLENOL) 500 MG tablet Take 1,000 mg by mouth daily as needed for moderate pain.   Yes [provider]  amiodarone (PACERONE) 200 MG tablet Take 200 mg by mouth daily.   Yes [provider]  amLODipine (NORVASC) 5 MG tablet Take 5 mg by mouth daily. 02/05/18  Yes [provider]  aspirin EC 81 MG tablet Take 81 mg by mouth every evening.    Yes [provider]  cholecalciferol (VITAMIN D) 25 MCG (1000 UT) tablet Take 1,000 Units by mouth 2 (two) times daily.    Yes [provider]  ELIQUIS 5 MG TABS tablet TAKE ONE TABLET BY MOUTH TWICE DAILY Patient taking differently: Take 5 mg by mouth 2 (two) times daily.  06/03/18  Yes Lorretta Harp, MD  Ferrous Sulfate (IRON PO) Take 325 mg by mouth 2 (two) times daily.    Yes [provider]  finasteride (PROSCAR) 5 MG tablet Take 5 mg by mouth daily. 02/05/18  Yes [provider]  loratadine (CLARITIN) 10 MG tablet Take 10 mg by mouth daily.   Yes [provider]  metFORMIN (GLUCOPHAGE) 1000 MG tablet Take 1,000 mg by mouth 2 (two) times daily. 02/05/18  Yes [provider]  metoprolol tartrate (LOPRESSOR) 25 MG tablet TAKE ONE TABLET BY MOUTH TWICE DAILY Patient taking differently: Take 25 mg by mouth 2 (two) times daily.  03/02/19  Yes Lorretta Harp, MD  Multiple Vitamins-Minerals (MULTIVITAMIN ADULT PO) Take 1 tablet by mouth daily.    Yes [provider]  oxyCODONE (OXY IR/ROXICODONE) 5 MG immediate release tablet Take 1-2 tablets (5-10 mg total) by mouth every 6 (six) hours as needed for moderate pain or severe pain (5mg  moderate, 10mg  severe). 04/13/19  Yes [name], MD  amiodarone (PACERONE) 100 MG tablet Take 1 tablet (100 mg total) by mouth daily. Patient not taking: Reported on 03/30/2019 01/20/19   Lorretta Harp, MD  oxyCODONE (OXY IR/ROXICODONE) 5 MG immediate release tablet Take 1 tablet (5 mg total) by mouth every 6 (six) hours as needed for severe pain. Patient not taking: Reported on 03/30/2019 10/07/18   [name], MD     Vital Signs: BP 120/63 (BP Location: Left Arm)   Pulse 69   Temp 98.1 F (36.7 C) (Oral)   Resp 17   Ht 5\' 11"  (1.803 m)   Wt 230 lb (104.3 kg)   SpO2 93%   BMI 32.08 kg/m   Physical Exam Vitals reviewed.  Skin:    General: Skin is warm and dry.     Comments: Site is clean and dry' NT no bleeding OP blood tinged serous color 30 cc recorded yesterday 20 cc in JP  Gram Stain FEW WBC PRESENT, PREDOMINANTLY PMN  ABUNDANT GRAM NEGATIVE RODS  ABUNDANT GRAM VARIABLE ROD  RARE GRAM POSITIVE COCCI  Culture NO GROWTH 2 DAYS NO ANAEROBES ISOLATED; CULTURE IN PROGRESS FOR 5 DAYS     Neurological:     Mental Status: He is  alert and oriented to person, place, and time.  Psychiatric:        Behavior: Behavior normal.     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 The drainage catheter was recently removed and patient head duodenal fistula repair 04/07/2019. EXAM: CT ABDOMEN AND PELVIS WITH CONTRAST TECHNIQUE: Multidetector CT imaging of the abdomen and pelvis was performed using the standard protocol following bolus administration of intravenous contrast. CONTRAST:  OMNIPAQUE IOHEXOL 300 MG/ML  SOLN COMPARISON:  CT scan 03/30/2019 FINDINGS:  Lower chest: The lung bases are clear of an acute process. No pleural effusions or pulmonary infiltrates. The heart is normal in size. No pericardial effusion. 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 Stable advanced three-vessel coronary artery calcifications. Hepatobiliary: No focal hepatic lesions are identified. No intrahepatic biliary dilatation. Gallbladder is surgically absent. No common bile duct dilatation. Pancreas: No mass, inflammation or ductal dilatation. Spleen: Normal size.  No focal lesions. Adrenals/Urinary Tract: The adrenal glands and kidneys are unremarkable. Small left renal cysts are noted. No worrisome renal lesions or hydronephrosis. There is a small amount of gas in the bladder possibly related to recent catheterization. Stomach/Bowel: There is moderate lateral wall thickening in the antropyloric region and also involving the proximal duodenum. This may be some type of surgical patch material. There is fairly significant surrounding inflammatory change and unfortunately recurrent abscesses in the gallbladder fossa region. The largest of which measures approximately 5.4 x 4.7 cm. I do not see any definite leaking oral contrast. The small bowel and colon are unremarkable. No obstructive findings. Stable colonic diverticulosis without findings for acute diverticulitis. Vascular/Lymphatic: Stable aortic and branch vessel atherosclerotic calcifications but no aneurysm or dissection. The major venous structures are patent. Enlarged celiac axis and paraduodenal lymph nodes likely inflammatory/infectious. Reproductive: Prostate gland and seminal vesicles are unremarkable. Other: No pelvic mass or free pelvic fluid collections. There is a masslike area of inflammatory change in the omental and peritoneal fat in the central anterior abdomen. I assume this is related to the right upper quadrant inflammatory process and abscesses but I do not see an obvious connection.  Musculoskeletal: No significant bony findings. IMPRESSION: 1. 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 There appears to be significant inflammatory change involving the antropyloric region of the stomach and proximal duodenum but I do not see any obvious leaking oral contrast. 2. 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 3. Diffuse colonic diverticulosis without findings for acute diverticulitis. These results will be called to the ordering clinician or representative by the Radiologist Assistant, and communication documented in the PACS or zVision Dashboard. Electronically Signed   By: Rudie Meyer M.D.   On: 04/30/2019 13:27   CT IMAGE GUIDED DRAINAGE BY PERCUTANEOUS CATHETER  Result Date: 05/01/2019 INDICATION: Recurrent gallbladder fossa abscess EXAM: CT-guided gallbladder fossa abscess drain MEDICATIONS: The patient is currently admitted to the hospital and receiving intravenous antibiotics. The antibiotics were administered within an appropriate time frame prior to the initiation of the procedure. ANESTHESIA/SEDATION: Fentanyl 100 mcg IV; Versed 3.0 mg IV Moderate Sedation Time:  13 minutes The patient was continuously monitored during the procedure by the interventional radiology nurse under my direct supervision. COMPLICATIONS: None immediate. PROCEDURE: Informed written consent was obtained from the patient after a thorough discussion of the procedural risks, benefits and  alternatives. All questions were addressed. Maximal Sterile Barrier Technique was utilized including caps, mask, sterile gowns, sterile gloves, sterile drape, hand hygiene and skin antiseptic. A timeout was performed prior to the initiation of the procedure. Previous imaging reviewed. Patient positioned supine. Noncontrast localization CT performed. The complex gallbladder fossa fluid  collection was localized and marked. Under sterile conditions and local anesthesia, an 18 gauge 10 cm access needle was advanced percutaneously through a lower intercostal space into the gallbladder fossa fluid collection. Needle position confirmed with CT. Syringe aspiration yielded purulent fluid. Sample culture. Guidewire inserted followed by tract dilatation to insert a 10 [place] drain. Drain catheter position advanced with the retention loop formed in abscess. Syringe aspiration yielded 100 cc of purulent fluid. Catheter secured with Prolene suture and connected to external suction bulb. Sterile dressing applied. No immediate complication. Patient tolerated the procedure well. IMPRESSION: Successful CT-guided gallbladder fossa abscess drain placement. Electronically Signed   By: Judie Petit.  Shick M.D.   On: 05/01/2019 10:57    Labs:  CBC: Recent Labs    04/12/19 0242 04/13/19 0122 05/01/19 0222 05/03/19 0215  WBC 5.9 6.1 11.7* 9.2  HGB 8.4* 8.4* 9.2* 9.0*  HCT 25.7* 25.7* 28.5* 29.4*  PLT 206 245 390 419*    COAGS: Recent Labs    10/06/18 0903 04/07/19 0849 04/08/19 0240 05/01/19 0222  INR 0.9 1.0 1.1 1.2    BMP: Recent Labs    04/09/19 0957 04/11/19 0326 05/01/19 0222 05/03/19 0215  NA 133* 135 132* 132*  K 4.7 3.7 4.5 4.9  CL 103 100 94* 97*  CO2 22 24 24  20*  GLUCOSE 151* 128* 125* 134*  BUN 19 9 11  7*  CALCIUM 8.6* 8.4* 9.5 9.0  CREATININE 1.31* 1.13 1.51* 1.33*  GFRNONAA 52* >60 44* 51*  GFRAA >60 >60 51* 59*    LIVER FUNCTION TESTS: Recent Labs    11/19/18 2010 04/01/19 1155 04/08/19 0240 04/11/19 0326  BILITOT 0.6 0.3 0.8 1.4*  AST 24 27 61* 18  ALT 22 23 54* 19  ALKPHOS 62 63 47 53  PROT 7.5 6.8 5.9* 5.2*  ALBUMIN 4.0 3.8 2.9* 2.4*    Assessment and Plan:  GB fossa drain intact 30 cc OP yesterday Blood tinged serous color Will follow Will need re CT when OP less than 10 cc daily   Electronically Signed: 14/09/20, PA-C 05/04/2019,  11:26 AM   I spent a total of 15 Minutes at the the patient's bedside AND on the patient's hospital floor or unit, greater than 50% of which was counseling/coordinating care for GB fossa abscess drain

## 2019-05-04 NOTE — Care Management Important Message (Signed)
Important Message  Patient Details  Name: [name] MRN: [id number] Date of Birth: July 04, 1942   Medicare Important Message Given:  Yes     Mardene Sayer 05/04/2019, 3:29 PM   Patient  Has Signed IM

## 2019-05-04 NOTE — Consult Note (Signed)
[facility]    Date of Admission:  04/30/2019           Day 5 piperacillin tazobactam       Reason for Consult: Recurrent right upper quadrant abscess    Referring Provider: Dr. [name]  Assessment: It is unclear if his current gallbladder fossa abscess is a late relapse of the abscess he had last year or any complication of his recent duodenal fistula repair.  He is improving so I will continue piperacillin tazobactam pending final cultures.  He will need another course of IV antibiotic therapy and is in agreement with PICC placement.  Plan: 1. Continue piperacillin tazobactam pending final cultures 2. PICC placement  Principal Problem:   Abscess, intra-abdominal, postoperative Active Problems:   Essential hypertension   Hyperlipidemia   Hx of CABG   Coronary artery disease   Anemia   Non-insulin dependent type 2 diabetes mellitus (HCC)   Protein-calorie malnutrition, severe   Scheduled Meds: . amiodarone  200 mg Oral Daily  . amLODipine  5 mg Oral Daily  . apixaban  5 mg Oral BID  . loratadine  10 mg Oral Daily  . metoprolol tartrate  25 mg Oral BID  . pantoprazole  40 mg Oral QHS  . sodium chloride flush  5 mL Intracatheter Q8H   Continuous Infusions: . dextrose 5 % and 0.45 % NaCl with KCl 20 mEq/L 10 mL/hr at 05/03/19 0818  . piperacillin-tazobactam (ZOSYN)  IV 3.375 g (05/04/19 1306)   PRN Meds:.acetaminophen, alum & mag hydroxide-simeth, diphenhydrAMINE **OR** diphenhydrAMINE, HYDROmorphone (DILAUDID) injection, ondansetron **OR** ondansetron (ZOFRAN) IV, oxyCODONE, zolpidem  HPI: [name] is a 74 y.o. male who underwent CABG in December 2018.  He was recovering uneventfully until he developed acute cholecystitis leading to readmission in January of last year.  Abscesses were noted in the gallbladder fossa and liver.  Abscess cultures grew E. coli, Klebsiella and staph capitis.  He was discharged with a percutaneous  drain on IV ceftriaxone and metronidazole.  He transition to oral amoxicillin clavulanate.  He improved, his drain was removed and my partner, Dr. Merceda Elks, stopped his antibiotic on 06/24/2018.  COVID-19 pandemic delayed to surgery but he eventually underwent laparoscopic cholecystectomy on 10/07/2018.  He was readmitted on 10/31/2018 with recurrent liver abscess.  New percutaneous drain was placed and drain cultures grew Klebsiella, Enterococcus and Prevotella.  He was discharged on oral trimethoprim sulfamethoxazole and amoxicillin clavulanate.  He is not sure when he stopped antibiotics but he says that he did improve.  His drain was left in.  He was readmitted again on 04/07/2019 with an acute duodenal fistula.  He underwent exploratory laparotomy and omental flap repair.  His drains were removed on 04/15/2019 and he was discharged home off of antibiotics.  He began to develop malaise, fatigue and abdominal pain leading to readmission on 04/30/2019.  CT scan revealed a recurrent abscess in the gallbladder fossa measuring 5.4 x 4.7 cm.  100 cc of pus was aspirated and a another percutaneous drain was placed.  He was started on empiric piperacillin tazobactam.  Abscess Gram stain shows gram-negative rods, gram variable rods and gram-positive cocci.  Cultures are still pending.  He is feeling better.  Of note he was hospitalized at an outside hospital in June 2011 with a liver abscess.  Abscess cultures at that time grew group D strep and Klebsiella.   Review of Systems: Review of Systems  Constitutional: Positive for malaise/fatigue and weight loss. Negative for chills, diaphoresis and fever.  Respiratory: Negative for cough and shortness of breath.   Cardiovascular: Negative for chest pain.  Gastrointestinal: Positive for abdominal pain, constipation and diarrhea. Negative for nausea and vomiting.  Skin: Negative for rash.    Past Medical History:  Diagnosis Date  . Allergies   . Anemia   . Anxiety    . CAD (coronary artery disease)   . Diabetes (HCC)   . Dyspnea    upon exertion  . Gout   . Hepatitis    at 74 years old  . Hyperlipemia   . Hypertension     Social History   Tobacco Use  . Smoking status: Former Smoker    Types: Cigarettes    Quit date: 1982    Years since quitting: 39.0  . Smokeless tobacco: Never Used  . Tobacco comment: quit 1982  Substance Use Topics  . Alcohol use: Not Currently  . Drug use: Never    Family History  Problem Relation Age of Onset  . Atrial fibrillation Mother   . Stroke Mother   . Cancer Maternal Aunt    Allergies  Allergen Reactions  . Ciprofloxacin Rash  . Erythromycin Ethylsuccinate Rash    OBJECTIVE: Blood pressure 120/63, pulse 69, temperature 98.1 F (36.7 C), temperature source Oral, resp. rate 17, height 5\' 11"  (1.803 m), weight 104.3 kg, SpO2 93 %.  Physical Exam Constitutional:      Comments: He is sitting up in a chair.  He is pleasant and talkative.  Cardiovascular:     Rate and Rhythm: Normal rate and regular rhythm.     Heart sounds: No murmur.  Pulmonary:     Effort: Pulmonary effort is normal.     Breath sounds: Normal breath sounds.  Abdominal:     Palpations: Abdomen is soft.     Tenderness: There is abdominal tenderness.     Comments: He has a right upper quadrant drain in place with thin brown drainage.  He has a healing midline incision without evidence of infection.     Lab Results Lab Results  Component Value Date   WBC 9.2 05/03/2019   HGB 9.0 (L) 05/03/2019   HCT 29.4 (L) 05/03/2019   MCV 91.6 05/03/2019   PLT 419 (H) 05/03/2019    Lab Results  Component Value Date   CREATININE 1.33 (H) 05/03/2019   BUN 7 (L) 05/03/2019   NA 132 (L) 05/03/2019   K 4.9 05/03/2019   CL 97 (L) 05/03/2019   CO2 20 (L) 05/03/2019    Lab Results  Component Value Date   ALT 19 04/11/2019   AST 18 04/11/2019   ALKPHOS 53 04/11/2019   BILITOT 1.4 (H) 04/11/2019     Microbiology: Recent Results  (from the past 240 hour(s))  SARS CORONAVIRUS 2 (TAT 6-24 HRS) Nasopharyngeal Nasopharyngeal Swab     Status: None   Collection Time: 04/30/19  8:17 PM   Specimen: Nasopharyngeal Swab  Result Value Ref Range Status   SARS Coronavirus 2 NEGATIVE NEGATIVE Final    Comment: (NOTE) SARS-CoV-2 target nucleic acids are NOT DETECTED. The SARS-CoV-2 RNA is generally detectable in upper and lower respiratory specimens during the acute phase of infection. Negative results do not preclude SARS-CoV-2 infection, do not rule out co-infections with other pathogens, and should not be used as the sole basis for treatment or other patient management decisions. Negative results must be combined with clinical observations, patient history,  and epidemiological information. The expected result is Negative. Fact Sheet for Patients: SugarRoll.be Fact Sheet for Healthcare Providers: https://www.woods-mathews.com/ This test is not yet approved or cleared by the [place] FDA and  has been authorized for detection and/or diagnosis of SARS-CoV-2 by FDA under an Emergency Use Authorization (EUA). This EUA will remain  in effect (meaning this test can be used) for the duration of the COVID-19 declaration under Section 56 4(b)(1) of the Act, 21 U.S.C. section 360bbb-3(b)(1), unless the authorization is terminated or revoked sooner. Performed at [facility], [place] [address] [place], [place] [address]   Aerobic/Anaerobic Culture (surgical/deep wound)     Status: None (Preliminary result)   Collection Time: 05/01/19 10:14 AM   Specimen: Abscess  Result Value Ref Range Status   Specimen Description ABSCESS  Final   Special Requests Normal  Final   Gram Stain   Final    FEW WBC PRESENT, PREDOMINANTLY PMN ABUNDANT GRAM NEGATIVE RODS ABUNDANT GRAM VARIABLE ROD RARE GRAM POSITIVE COCCI    Culture   Final    NO GROWTH 3 DAYS NO ANAEROBES ISOLATED; CULTURE IN  PROGRESS FOR 5 DAYS Performed at [facility], [place] [address] [place], [place] [address]    Report Status PENDING  Incomplete    Michel Bickers, MD [facility] for Infectious [place] [facility] [phone] pager   [phone] cell 05/04/2019, 3:20 PM

## 2019-05-05 ENCOUNTER — Inpatient Hospital Stay: Payer: Self-pay

## 2019-05-05 ENCOUNTER — Encounter

## 2019-05-05 MED ORDER — SODIUM CHLORIDE 0.9% FLUSH
10.0000 mL | INTRAVENOUS | Status: DC | PRN
Start: 2019-05-05 — End: 2019-05-06
  Administered 2019-05-06: 09:00:00 10 mL

## 2019-05-05 MED ORDER — CHLORHEXIDINE GLUCONATE CLOTH 2 % EX PADS
6.0000 | Freq: Every day | CUTANEOUS | Status: DC
Start: 2019-05-05 — End: 2019-05-06
  Administered 2019-05-05 – 2019-05-06 (×2): 6 via TOPICAL

## 2019-05-05 MED ORDER — SODIUM CHLORIDE 0.9% FLUSH
10.0000 mL | Freq: Two times a day (BID) | INTRAVENOUS | Status: DC
Start: 2019-05-05 — End: 2019-05-06

## 2019-05-05 NOTE — Plan of Care (Signed)
  Problem: Pain Managment: Goal: General experience of comfort will improve Outcome: Progressing   Problem: Safety: Goal: Ability to remain free from injury will improve Outcome: Progressing   Problem: Skin Integrity: Goal: Risk for impaired skin integrity will decrease Outcome: Progressing   

## 2019-05-05 NOTE — Progress Notes (Signed)
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   Passing plenty of gas, eating better, no n/v.  Not having significant substantial bowel movements.    Objective: Vital signs in last 24 hours: Temp:  [98.1 F (36.7 C)-98.5 F (36.9 C)] 98.1 F (36.7 C) (01/05 0420) Pulse Rate:  [59-74] 74 (01/05 0420) Resp:  [16-19] 16 (01/05 0420) BP: (130-147)/(64-70) 147/70 (01/05 0420) SpO2:  [97 %-98 %] 98 % (01/05 0420) Last BM Date: 05/04/19  Intake/Output from previous day: 01/04 0701 - 01/05 0700 In: 135 [P.O.:120] Out: 35 [Drains:35] Intake/Output this shift: No intake/output data recorded.  General appearance: alert and cooperative, OOB eating breakfast.   GI: soft, non-distended Drain with serosang fluid  Lab Results:  No results found for this or any previous visit (from the past 24 hour(s)).   Studies/Results Radiology     MEDS, Scheduled . amiodarone  200 mg Oral Daily  . amLODipine  5 mg Oral Daily  . apixaban  5 mg Oral BID  . loratadine  10 mg Oral Daily  . metoprolol tartrate  25 mg Oral BID  . pantoprazole  40 mg Oral QHS  . sodium chloride flush  5 mL Intracatheter Q8H     Assessment: Abscess, intra-abdominal, postoperative S/p IR drain placement 1/1  Plan: Diet as tolerated. PICC today.   Hopefully home tomorrow with outpatient antibiotics.     LOS: 5 days    Maudry Diego, MD FACS Surgical Oncology, General Surgery, Trauma and Critical [facility], [place] [phone] for weekday/non holidays Check [email or web address] for coverage night/weekend/holidays  Do not use SecureChat as we are not always in the epic system to get the message.  Also, we may be off.  It is not reliable for patient care.

## 2019-05-05 NOTE — TOC Initial Note (Signed)
Transition of Care [facility]) - Initial/Assessment Note    Patient Details  Name: [name] MRN: [id number] Date of Birth: [birth year]  [facility] CM/SW Contact:    Kingsley Plan, RN Phone Number: 05/05/2019, 5:07 PM  Clinical Narrative:                 Confirmed face sheet information with patient. Patient from home alone, has been at home before with IV ABX.   Patient requested [facility] Patty, called Valerie with [facility], due to staffing they can not accept patient. Cory with [facility] has accepted.   Left Pam with [facility] a message.   Expected Discharge Plan: Home w Home Health Services Barriers to Discharge: Continued Medical Work up   Patient Goals and CMS Choice Patient states their goals for this hospitalization and ongoing recovery are:: to return to home CMS Medicare.gov Compare Post Acute Care list provided to:: Patient Choice offered to / list presented to : Patient  Expected Discharge Plan and Services Expected Discharge Plan: Home w Home Health Services   Discharge Planning Services: CM Consult Post Acute Care Choice: Home Health Living arrangements for the past 2 months: Single Family Home                 DME Arranged: N/A         HH Arranged: RN HH Agency: [facility] Date [facility] Agency Contacted: 05/05/19 Time HH Agency Contacted: 1706 Representative spoke with at [facility] Agency: Kandee Keen  Prior Living Arrangements/Services Living arrangements for the past 2 months: Single Family Home Lives with:: Self Patient language and need for interpreter reviewed:: Yes Do you feel safe going back to the place where you live?: Yes      Need for Family Participation in Patient Care: No (Comment) Care giver support system in place?: Yes (comment)   Criminal Activity/Legal Involvement Pertinent to Current Situation/Hospitalization: No - Comment as needed  Activities of Daily Living      Permission Sought/Granted    Permission granted to share information with : No              Emotional Assessment Appearance:: Appears stated age     Orientation: : Oriented to Situation, Oriented to  Time, Oriented to Place, Oriented to Self Alcohol / Substance Use: Not Applicable Psych Involvement: No (comment)  Admission diagnosis:  Postoperative intra-abdominal abscess [T81.43XA] Patient Active Problem List   Diagnosis Date Noted  . 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 AKI (acute kidney injury) (HCC) 11/01/2018  . RBBB 11/01/2018  . Abscess, intra-abdominal, postoperative 10/31/2018  . Protein-calorie malnutrition, severe 05/10/2018  . Acute diverticulitis 05/09/2018  . Atrial flutter (HCC) 05/01/2018  . Anemia 05/01/2018  . Non-insulin dependent type 2 diabetes mellitus (HCC) 05/01/2018  . Coronary artery disease 03/31/2018  . Hx of CABG   . Essential hypertension 02/25/2018  . Hyperlipidemia 02/25/2018   PCP:  Karle Plumber, MD Pharmacy:   [facility] [place], [place] - [address] [place] [address] Phone: [phone] Fax: [phone]     Social Determinants of Health (SDOH) Interventions    Readmission Risk Interventions Readmission Risk Prevention Plan 11/12/2018  Transportation Screening Complete  PCP or Specialist Appt within 3-5 Days Complete  HRI or Home Care Consult Complete  Social Work Consult for Recovery Care Planning/Counseling Complete  Palliative Care Screening Not Applicable  Medication Review [employer]) Complete  Some recent data  might be hidden

## 2019-05-05 NOTE — Progress Notes (Signed)
Patient ID: BRITTIN JANIK, male   DOB: Jan 08, 1943, 74 y.o.   MRN: [id number]          [facility] for Infectious Disease    Date of Admission:  04/30/2019   Day 6 piperacillin tazobactam         He is currently having a PICC placed so I have not examined him today.  He has a recurrent gallbladder fossa abscess.  Abscess cultures remain negative at 4 days.  Previous abscess cultures have grown Klebsiella, E. coli, Enterococcus, methicillin-resistant coagulase-negative staph and Prevotella.  If his current abscess cultures remain negative tomorrow morning change his antibiotic therapy to carefully monitored vancomycin and ertapenem.         Cliffton Asters, MD [facility] for Infectious Disease [facility] [phone] pager   [phone] cell 05/05/2019, 12:13 PM

## 2019-05-05 NOTE — Progress Notes (Signed)
Peripherally Inserted Central Catheter/Midline Placement  The IV Nurse has discussed with the patient and/or persons authorized to consent for the patient, the purpose of this procedure and the potential benefits and risks involved with this procedure.  The benefits include less needle sticks, lab draws from the catheter, and the patient may be discharged home with the catheter. Risks include, but not limited to, infection, bleeding, blood clot (thrombus formation), and puncture of an artery; nerve damage and irregular heartbeat and possibility to perform a PICC exchange if needed/ordered by physician.  Alternatives to this procedure were also discussed.  Bard Power PICC patient education guide, fact sheet on infection prevention and patient information card has been provided to patient /or left at bedside.    PICC/Midline Placement Documentation  PICC Double Lumen 05/05/19 PICC Right Basilic 42 cm (Active)  Indication for Insertion or Continuance of Line Home intravenous therapies (PICC only) 05/05/19 1200  Exposed Catheter (cm) 0 cm 05/05/19 1200  Site Assessment Clean;Dry;Intact 05/05/19 1200  Lumen #1 Status Flushed;Blood return noted 05/05/19 1200  Lumen #2 Status Flushed;Blood return noted 05/05/19 1200  Dressing Type Transparent 05/05/19 1200  Dressing Status Clean;Dry;Intact;Antimicrobial disc in place 05/05/19 1200  Dressing Change Due 05/12/19 05/05/19 1200       Stacie Glaze Horton 05/05/2019, 12:15 PM

## 2019-05-05 NOTE — Progress Notes (Signed)
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   Doing better.  Nausea resolved.  Pt getting out of bed.  Objective: Vital signs in last 24 hours: Temp:  [98.1 F (36.7 C)-98.5 F (36.9 C)] 98.1 F (36.7 C) (01/05 0420) Pulse Rate:  [59-74] 74 (01/05 0420) Resp:  [16-19] 16 (01/05 0420) BP: (130-147)/(64-70) 147/70 (01/05 0420) SpO2:  [97 %-98 %] 98 % (01/05 0420) Last BM Date: 05/04/19  Intake/Output from previous day: 01/04 0701 - 01/05 0700 In: 135 [P.O.:120] Out: 35 [Drains:35] Intake/Output this shift: No intake/output data recorded.  General appearance: alert and cooperative GI: soft, non-distended Drain with serosang fluid  Lab Results:  No results found for this or any previous visit (from the past 24 hour(s)).   Studies/Results Radiology     MEDS, Scheduled . amiodarone  200 mg Oral Daily  . amLODipine  5 mg Oral Daily  . apixaban  5 mg Oral BID  . loratadine  10 mg Oral Daily  . metoprolol tartrate  25 mg Oral BID  . pantoprazole  40 mg Oral QHS  . sodium chloride flush  5 mL Intracatheter Q8H     Assessment: Abscess, intra-abdominal, postoperative S/p IR drain placement 1/1  Plan: Diet as tolerated. Consult ID for length of antibiotic tx and whether or not outpt IV would be beneficial.     LOS: 5 days    Maudry Diego, MD FACS Surgical Oncology, General Surgery, Trauma and Critical [facility], [place] [phone] for weekday/non holidays Check [email or web address] for coverage night/weekend/holidays  Do not use SecureChat as we are not always in the epic system to get the message.  Also, we may be off.  It is not reliable for patient care.

## 2019-05-05 NOTE — Progress Notes (Addendum)
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 Authorizing Provider  acetaminophen (TYLENOL) 500 MG tablet Take 1,000 mg by mouth daily as needed for moderate pain.   Yes [provider]  amiodarone (PACERONE) 200 MG tablet Take 200 mg by mouth daily.   Yes [provider]  amLODipine (NORVASC) 5 MG tablet Take 5 mg by mouth daily. 02/05/18  Yes [provider]  aspirin EC 81 MG tablet Take 81 mg by mouth every evening.    Yes [provider]  cholecalciferol (VITAMIN D) 25 MCG (1000 UT) tablet Take 1,000 Units by mouth 2 (two) times daily.    Yes [provider]  ELIQUIS 5 MG TABS tablet TAKE ONE TABLET BY MOUTH TWICE DAILY Patient taking differently: Take 5 mg by mouth 2 (two) times daily.  06/03/18  Yes Runell Gess, MD  Ferrous Sulfate (IRON PO) Take 325 mg by mouth 2 (two) times daily.    Yes [provider]  finasteride (PROSCAR) 5 MG tablet Take 5 mg by mouth daily. 02/05/18  Yes [provider]  loratadine (CLARITIN) 10 MG tablet Take 10 mg by mouth daily.   Yes [provider]  metFORMIN (GLUCOPHAGE) 1000 MG tablet Take 1,000 mg by mouth 2 (two) times daily. 02/05/18  Yes [provider]  metoprolol tartrate (LOPRESSOR) 25 MG tablet TAKE ONE TABLET BY MOUTH TWICE DAILY Patient taking differently: Take 25 mg by mouth 2 (two) times daily.  03/02/19  Yes Runell Gess, MD  Multiple Vitamins-Minerals (MULTIVITAMIN ADULT PO) Take 1 tablet by  mouth daily.   Yes [provider]  oxyCODONE (OXY IR/ROXICODONE) 5 MG immediate release tablet Take 1-2 tablets (5-10 mg total) by mouth every 6 (six) hours as needed for moderate pain or severe pain (5mg  moderate, 10mg  severe). 04/13/19  Yes , MD  amiodarone (PACERONE) 100 MG tablet Take 1 tablet (100 mg total) by mouth daily. Patient not taking: Reported on 03/30/2019 01/20/19   04/01/2019, MD  oxyCODONE (OXY IR/ROXICODONE) 5 MG immediate release tablet Take 1 tablet (5 mg total) by mouth every 6 (six) hours as needed for severe pain. Patient not taking: Reported on 03/30/2019 10/07/18   04/01/2019, MD     Vital Signs: BP 120/63 (BP Location: Left Arm)   Pulse 69   Temp 98.1 F (36.7 C) (Oral)   Resp 17   Ht 5\' 11"  (1.803 m)   Wt 230 lb (104.3 kg)   SpO2 93%   BMI 32.08 kg/m   Physical Exam Vitals reviewed.  Skin:    General: Skin is warm and dry.     Comments: Site is clean and dry NT No bleeding OP serous 10 cc in JP Changed to gravity bag  Neurological:     Mental Status: He is alert and oriented to person, place, and time.  Psychiatric:        Behavior: Behavior normal.     Imaging: CT IMAGE GUIDED DRAINAGE BY PERCUTANEOUS CATHETER  Result Date: 05/01/2019 INDICATION: Recurrent gallbladder fossa  abscess EXAM: CT-guided gallbladder fossa abscess drain MEDICATIONS: The patient is currently admitted to the hospital and receiving intravenous antibiotics. The antibiotics were administered within an appropriate time frame prior to the initiation of the procedure. ANESTHESIA/SEDATION: Fentanyl 100 mcg IV; Versed 3.0 mg IV Moderate Sedation Time:  13 minutes The patient was continuously monitored during the procedure by the interventional radiology nurse under my direct supervision. COMPLICATIONS: None immediate. PROCEDURE: Informed written consent was obtained from the patient after a thorough discussion of the procedural risks, benefits and  alternatives. All questions were addressed. Maximal Sterile Barrier Technique was utilized including caps, mask, sterile gowns, sterile gloves, sterile drape, hand hygiene and skin antiseptic. A timeout was performed prior to the initiation of the procedure. Previous imaging reviewed. Patient positioned supine. Noncontrast localization CT performed. The complex gallbladder fossa fluid collection was localized and marked. Under sterile conditions and local anesthesia, an 18 gauge 10 cm access needle was advanced percutaneously through a lower intercostal space into the gallbladder fossa fluid collection. Needle position confirmed with CT. Syringe aspiration yielded purulent fluid. Sample culture. Guidewire inserted followed by tract dilatation to insert a 10 [place] drain. Drain catheter position advanced with the retention loop formed in abscess. Syringe aspiration yielded 100 cc of purulent fluid. Catheter secured with Prolene suture and connected to external suction bulb. Sterile dressing applied. No immediate complication. Patient tolerated the procedure well. IMPRESSION: Successful CT-guided gallbladder fossa abscess drain placement. Electronically Signed   By: Judie Petit.  Shick M.D.   On: 05/01/2019 10:57   [place] EKG SITE RITE  Result Date: 05/05/2019 If Site Rite image not attached, placement could not be confirmed due to current cardiac rhythm.  [place] EKG SITE RITE  Result Date: 05/04/2019 If Site Rite image not attached, placement could not be confirmed due to current cardiac rhythm.   Labs:  CBC: Recent Labs    04/12/19 0242 04/13/19 0122 05/01/19 0222 05/03/19 0215  WBC 5.9 6.1 11.7* 9.2  HGB 8.4* 8.4* 9.2* 9.0*  HCT 25.7* 25.7* 28.5* 29.4*  PLT 206 245 390 419*    COAGS: Recent Labs    10/06/18 0903 04/07/19 0849 04/08/19 0240 05/01/19 0222  INR 0.9 1.0 1.1 1.2    BMP: Recent Labs    04/09/19 0957 04/11/19 0326 05/01/19 0222 05/03/19 0215  NA 133* 135 132* 132*  K 4.7 3.7 4.5  4.9  CL 103 100 94* 97*  CO2 22 24 24  20*  GLUCOSE 151* 128* 125* 134*  BUN 19 9 11  7*  CALCIUM 8.6* 8.4* 9.5 9.0  CREATININE 1.31* 1.13 1.51* 1.33*  GFRNONAA 52* >60 44* 51*  GFRAA >60 >60 51* 59*    LIVER FUNCTION TESTS: Recent Labs    11/19/18 2010 04/01/19 1155 04/08/19 0240 04/11/19 0326  BILITOT 0.6 0.3 0.8 1.4*  AST 24 27 61* 18  ALT 22 23 54* 19  ALKPHOS 62 63 47 53  PROT 7.5 6.8 5.9* 5.2*  ALBUMIN 4.0 3.8 2.9* 2.4*    Assessment and Plan:  GB fossa drain Intact-- to remain at DC Will need to flush daily - 10 cc sterile saline and record OP Need Rx for 10 cc flushes IR will call pt with time and date of drain follow up  Electronically Signed: 14/09/20, PA-C 05/05/2019, 9:05 AM   I spent a total of 15 Minutes at the the patient's bedside AND on the patient's hospital floor or unit, greater than 50% of which was counseling/coordinating care for GB fossa abscess  drain

## 2019-05-06 ENCOUNTER — Other Ambulatory Visit: Payer: Self-pay | Admitting: Internal Medicine

## 2019-05-06 DIAGNOSIS — K810 Acute cholecystitis: Secondary | ICD-10-CM

## 2019-05-06 DIAGNOSIS — T8143XA Infection following a procedure, organ and space surgical site, initial encounter: Secondary | ICD-10-CM

## 2019-05-06 MED ORDER — HEPARIN SOD (PORK) LOCK FLUSH 100 UNIT/ML IV SOLN
250.0000 [IU] | INTRAVENOUS | Status: DC | PRN
Start: 2019-05-06 — End: 2019-05-06
  Filled 2019-05-06: qty 2.5

## 2019-05-06 MED ORDER — SODIUM CHLORIDE 0.9 % IV SOLN
1.0000 g | INTRAVENOUS | Status: DC
Start: 2019-05-06 — End: 2019-05-06
  Administered 2019-05-06: 12:00:00 1000 mg via INTRAVENOUS
  Filled 2019-05-06 (×2): qty 1

## 2019-05-06 MED ORDER — METRONIDAZOLE 500 MG PO TABS
500.0000 mg | Freq: Three times a day (TID) | ORAL | 0 refills | Status: DC
Start: 2019-05-06 — End: 2019-05-26

## 2019-05-06 MED ORDER — VANCOMYCIN HCL 1500 MG/300ML IV SOLN
1500.0000 mg | INTRAVENOUS | Status: DC
Start: 2019-05-06 — End: 2019-05-06
  Administered 2019-05-06: 10:00:00 1500 mg via INTRAVENOUS
  Filled 2019-05-06: qty 300

## 2019-05-06 MED ORDER — METRONIDAZOLE 500 MG PO TABS
500.0000 mg | Freq: Three times a day (TID) | ORAL | 1 refills | Status: DC
Start: 2019-05-06 — End: 2019-05-06

## 2019-05-06 MED ORDER — CEFTRIAXONE IV (FOR PTA / DISCHARGE USE ONLY)
2.0000 g | INTRAVENOUS | 0 refills | Status: DC
Start: 2019-05-06 — End: 2019-05-26

## 2019-05-06 MED ORDER — OXYCODONE HCL 5 MG PO TABS
5.0000 mg | Freq: Four times a day (QID) | ORAL | 0 refills | Status: DC | PRN
Start: 2019-05-06 — End: 2020-01-13

## 2019-05-06 MED ORDER — VANCOMYCIN IV (FOR PTA / DISCHARGE USE ONLY)
1500.0000 mg | INTRAVENOUS | 0 refills | Status: AC
Start: 2019-05-06 — End: 2019-06-12

## 2019-05-06 MED ORDER — ERTAPENEM SODIUM 1 G IJ SOLR
1.0000 g | INTRAMUSCULAR | Status: DC
Start: 2019-05-06 — End: 2019-05-06
  Filled 2019-05-06: qty 1

## 2019-05-06 MED ORDER — ERTAPENEM IV (FOR PTA / DISCHARGE USE ONLY)
1.0000 g | INTRAVENOUS | 0 refills | Status: DC
Start: 2019-05-06 — End: 2019-05-06

## 2019-05-06 MED FILL — metroNIDAZOLE 500 MG TABS: 500 | 37 days supply | Qty: 111 | Fill #0

## 2019-05-06 NOTE — Progress Notes (Addendum)
PHARMACY CONSULT NOTE FOR:  OUTPATIENT  PARENTERAL ANTIBIOTIC THERAPY (OPAT)  Indication: Recurrent intra-abdominal infection  Regimen: Vancomycin IV 1500mg  q24h,Ceftriaxone IV 2g q24h  End date: 06/12/2019  IV antibiotic discharge orders are pended. To discharging provider:  please sign these orders via discharge navigator,  Select New Orders & click on the button choice - Manage This Unsigned Work.     Thank you for allowing pharmacy to be a part of this patient's care.  08/10/2019 05/06/2019, 9:11 AM

## 2019-05-06 NOTE — Plan of Care (Signed)

## 2019-05-06 NOTE — Progress Notes (Signed)
Sharon F Cake to be D/C'd  per MD order. Discussed with the patient and all questions fully answered.  VSS, Skin clean, dry and intact without evidence of skin break down, no evidence of skin tears noted.  IV catheter discontinued intact. Site without signs and symptoms of complications. Dressing and pressure applied.  An After Visit Summary was printed and given to the patient. Patient received prescription.  D/c education completed with patient/family including follow up instructions, medication list, d/c activities limitations if indicated, with other d/c instructions as indicated by MD - patient able to verbalize understanding, all questions fully answered.   Patient instructed to return to ED, call 911, or call MD for any changes in condition.   Patient to be escorted via WC, and D/C home via private auto. 

## 2019-05-06 NOTE — Discharge Instructions (Addendum)
Percutaneous Abscess Drain, Care After This sheet gives you information about how to care for yourself after your procedure. Your health care provider may also give you more specific instructions. If you have problems or questions, contact your health care provider. What can I expect after the procedure? After your procedure, it is common to have:  A small amount of bruising and discomfort in the area where the drainage tube (catheter) was placed.  Sleepiness and fatigue. This should go away after the medicines you were given have worn off. Follow these instructions at home: Incision care  Follow instructions from your health care provider about how to take care of your incision. Make sure you: ? Wash your hands with soap and water before you change your bandage (dressing). If soap and water are not available, use hand sanitizer. ? Change your dressing as told by your health care provider. ? Leave stitches (sutures), skin glue, or adhesive strips in place. These skin closures may need to stay in place for 2 weeks or longer. If adhesive strip edges start to loosen and curl up, you may trim the loose edges. Do not remove adhesive strips completely unless your health care provider tells you to do that.  Check your incision area every day for signs of infection. Check for: ? More redness, swelling, or pain. ? More fluid or blood. ? Warmth. ? Pus or a bad smell. ? Fluid leaking from around your catheter (instead of fluid draining through your catheter). Catheter care   Follow instructions from your health care provider about emptying and cleaning your catheter and collection bag. You may need to clean the catheter every day so it does not clog.  If directed, write down the following information every time you empty your bag: ? The date and time. ? The amount of drainage. General instructions  Rest at home for 1-2 days after your procedure. Return to your normal activities as told by your  health care provider.  Do not take baths, swim, or use a hot tub for 24 hours after your procedure, or until your health care provider says that this is okay.  Take over-the-counter and prescription medicines only as told by your health care provider.  Keep all follow-up visits as told by your health care provider. This is important. Contact a health care provider if:  You have less than 10 mL of drainage a day for 2-3 days in a row, or as directed by your health care provider.  You have more redness, swelling, or pain around your incision area.  You have more fluid or blood coming from your incision area.  Your incision area feels warm to the touch.  You have pus or a bad smell coming from your incision area.  You have fluid leaking from around your catheter (instead of through your catheter).  You have a fever or chills.  You have pain that does not get better with medicine. Get help right away if:  Your catheter comes out.  You suddenly stop having drainage from your catheter.  You suddenly have blood in the fluid that is draining from your catheter.  You become dizzy or you faint.  You develop a rash.  You have nausea or vomiting.  You have difficulty breathing or you feel short of breath.  You develop chest pain.  You have problems with your speech or vision.  You have trouble balancing or moving your arms or legs. Summary  It is common to have a small  amount of bruising and discomfort in the area where the drainage tube (catheter) was placed.  You may be directed to record the amount of drainage from the bag every time you empty it.  Follow instructions from your health care provider about emptying and cleaning your catheter and collection bag. This information is not intended to replace advice given to you by your health care provider. Make sure you discuss any questions you have with your health care provider. Document Revised: 03/29/2017 Document  Reviewed: 03/08/2016 Elsevier Patient Education  2020 [employer].  ===================================================================================  Information on my medicine - ELIQUIS (apixaban)  Why was Eliquis prescribed for you? Eliquis was prescribed for you to reduce the risk of a blood clot forming that can cause a stroke if you have a medical condition called atrial fibrillation (a type of irregular heartbeat).  What do You need to know about Eliquis ? Take your Eliquis TWICE DAILY - one tablet in the morning and one tablet in the evening with or without food. If you have difficulty swallowing the tablet whole please discuss with your pharmacist how to take the medication safely.  Take Eliquis exactly as prescribed by your doctor and DO NOT stop taking Eliquis without talking to the doctor who prescribed the medication.  Stopping may increase your risk of developing a stroke.  Refill your prescription before you run out.  After discharge, you should have regular check-up appointments with your healthcare provider that is prescribing your Eliquis.  In the future your dose may need to be changed if your kidney function or weight changes by a significant amount or as you get older.  What do you do if you miss a dose? If you miss a dose, take it as soon as you remember on the same day and resume taking twice daily.  Do not take more than one dose of ELIQUIS at the same time to make up a missed dose.  Important Safety Information A possible side effect of Eliquis is bleeding. You should call your healthcare provider right away if you experience any of the following: ? Bleeding from an injury or your nose that does not stop. ? Unusual colored urine (red or dark brown) or unusual colored stools (red or black). ? Unusual bruising for unknown reasons. ? A serious fall or if you hit your head (even if there is no bleeding).  Some medicines may interact with Eliquis and might  increase your risk of bleeding or clotting while on Eliquis. To help avoid this, consult your healthcare provider or pharmacist prior to using any new prescription or non-prescription medications, including herbals, vitamins, non-steroidal anti-inflammatory drugs (NSAIDs) and supplements.  This website has more information on Eliquis (apixaban): http://www.eliquis.com/eliquis/home

## 2019-05-06 NOTE — Discharge Summary (Addendum)
Physician Discharge Summary  Patient ID: CHAI ROUTH MRN: [id number] DOB/AGE: July 03, 1942 74 y.o.  Admit date: 04/30/2019 Discharge date: 05/06/2019  Admission Diagnoses: Duodenal fistula, repaired. RUQ abscess. HTN CAD, s/p CABG NIDDM Acute kidney injury  Discharge Diagnoses:  Principal Problem:   Abscess, intra-abdominal, postoperative Active Problems:   Essential hypertension   Hyperlipidemia   Hx of CABG   Coronary artery disease   Anemia   Non-insulin dependent type 2 diabetes mellitus ([place])   Discharged Condition: stable  Hospital Course:  Pt was admitted to the hospital 04/30/2019 after outpatient CT showed recurrent RUQ abscess.  He had duodenal fistula repair 04/07/2019 after a protracted course with liver abscess, cholecystitis, perc chole tube, multiple OR delays due to [place].  He finally had lap chole in June 2020 which was a very difficult surgery.  He developed a post op abscess and has pretty much had a perc drain since then.  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  At time of repair, drain was removed and replaced with surgical drain.  This was pulled prior to discharge as it was serosanguinous.    He called the office due to malaise, nausea, and poor PO intake.  WBCs were 16 K as an outpatient and he received scan.  Once admitted, IR performed repeat Perc drain.  Output was initially purulent, but became serosanguinous prior to d/c.  Due to prolonged course, ID was consulted for antibiotic recommendations.  His culture did not grow out despite having gram negative rods and rare GPCs on the gram stain.  He clinically improved and WBCs came back down after drain placement and antibiotics.    He is discharged on IV vanc and ertapenem at the direction of Dr. Megan Salon of ID.  He has been instructed on drain management and IR instructions are to flush drain daily with 10 mL saline.    Consults: ID and IR  Significant Diagnostic Studies: labs:  culture described above.  Cr back down to 1.33 prior to d/c. WBCs down to 9.2k prior to d/c.     Treatments: antibiotics: Zosyn  Discharge Exam: Blood pressure 120/63, pulse 69, temperature 98.1 F (36.7 C), temperature source Oral, resp. rate 17, height _0  (1.803 m), weight 104.3 kg, SpO2 93 %. General appearance: alert, cooperative and no distress Resp: breathing comfortably GI: soft, non distended, non tender except at drain site.  drain serosan Extremities: no cyanosis, tr edema a little pale.    Disposition: Discharge disposition: 01-Home or Self Care       Discharge Instructions    Call MD for:  difficulty breathing, headache or visual disturbances   Complete by: As directed    Call MD for:  hives   Complete by: As directed    Call MD for:  persistant nausea and vomiting   Complete by: As directed    Call MD for:  redness, tenderness, or signs of infection (pain, swelling, redness, odor or green/yellow discharge around incision site)   Complete by: As directed    Call MD for:  severe uncontrolled pain   Complete by: As directed    Call MD for:  temperature >100.4   Complete by: As directed    Change dressing (specify)   Complete by: As directed    Measure and record drain output at least once daily.  Bring record to clinic.   Diet - low sodium heart healthy   Complete by: As directed    Home infusion instructions  Complete by: As directed    Instructions: Flushing of vascular access device: 0.9% NaCl pre/post medication administration and prn patency; Heparin 100 u/ml, 62m for implanted ports and Heparin 10u/ml, 5850mfor all other central venous catheters.   Home infusion instructions   Complete by: As directed    Instructions: Flushing of vascular access device: 0.9% NaCl pre/post medication administration and prn patency; Heparin 100 u/ml, 50m70mor implanted ports and Heparin 10u/ml, 50ml46mr all other central venous catheters.   Increase activity slowly    Complete by: As directed      Allergies as of 05/06/2019      Reactions   Ciprofloxacin Rash   Erythromycin Ethylsuccinate Rash      Medication List    TAKE these medications   acetaminophen 500 MG tablet Commonly known as: TYLENOL Take 1,000 mg by mouth daily as needed for moderate pain.   amiodarone 200 MG tablet Commonly known as: PACERONE Take 200 mg by mouth daily.   amiodarone 100 MG tablet Commonly known as: PACERONE Take 1 tablet (100 mg total) by mouth daily.   amLODipine 5 MG tablet Commonly known as: NORVASC Take 5 mg by mouth daily.   aspirin EC 81 MG tablet Take 81 mg by mouth every evening.   cefTRIAXone  IVPB Commonly known as: ROCEPHIN Inject 2 g into the vein daily. Indication:  Recurrent Intra-abdominal infection Last Day of Therapy:  06/12/2019 Labs - Once weekly:  CBC/D and BMP, Labs - Every other week:  ESR and CRP   cholecalciferol 25 MCG (1000 UT) tablet Commonly known as: VITAMIN D Take 1,000 Units by mouth 2 (two) times daily.   Eliquis 5 MG Tabs tablet Generic drug: apixaban TAKE ONE TABLET BY MOUTH TWICE DAILY What changed: how much to take   ertapenem  IVPB Commonly known as: INVANZ Inject 1 g into the vein daily. Indication:  Recurrent Intra-abdominal infection  Last Day of Therapy:  06/12/2019 Labs - Once weekly:  CBC/D and BMP, Labs - Every other week:  ESR and CRP   finasteride 5 MG tablet Commonly known as: PROSCAR Take 5 mg by mouth daily.   IRON PO Take 325 mg by mouth 2 (two) times daily.   loratadine 10 MG tablet Commonly known as: CLARITIN Take 10 mg by mouth daily.   metFORMIN 1000 MG tablet Commonly known as: GLUCOPHAGE Take 1,000 mg by mouth 2 (two) times daily.   metoprolol tartrate 25 MG tablet Commonly known as: LOPRESSOR TAKE ONE TABLET BY MOUTH TWICE DAILY   metroNIDAZOLE 500 MG tablet Commonly known as: Flagyl Take 1 tablet (500 mg total) by mouth 3 (three) times daily.   MULTIVITAMIN ADULT  PO Take 1 tablet by mouth daily.   oxyCODONE 5 MG immediate release tablet Commonly known as: Oxy IR/ROXICODONE Take 1 tablet (5 mg total) by mouth every 6 (six) hours as needed for severe pain. What changed: Another medication with the same name was removed. Continue taking this medication, and follow the directions you see here.   vancomycin  IVPB Inject 1,500 mg into the vein daily. Indication:  Recurrent Intra-abdominal infection  Last Day of Therapy:  06/12/2019 Labs - Sunday/Monday:  CBC/D, BMP, and vancomycin trough. Labs - Thursday:  BMP and vancomycin trough Labs - Every other week:  ESR and CRP            Home Infusion Instuctions  (From admission, onward)         Start  Ordered   05/06/19 0000  Home infusion instructions    Question:  Instructions  Answer:  Flushing of vascular access device: 0.9% NaCl pre/post medication administration and prn patency; Heparin 100 u/ml, 66m for implanted ports and Heparin 10u/ml, 521mfor all other central venous catheters.   05/06/19 1127   05/06/19 0000  Home infusion instructions    Question:  Instructions  Answer:  Flushing of vascular access device: 0.9% NaCl pre/post medication administration and prn patency; Heparin 100 u/ml, 20m4mor implanted ports and Heparin 10u/ml, 20ml70mr all other central venous catheters.   05/06/19 1411           Discharge Care Instructions  (From admission, onward)         Start     Ordered   05/06/19 0000  Change dressing (specify)    Comments: Measure and record drain output at least once daily.  Bring record to clinic.   05/06/19 1132         Follow-up Information    WattSandi Mariscal Follow up in 10 day(s).   Specialties: Interventional Radiology, Radiology Why: call [phone] questions; we will call pt for time and date of follow up in [facility]; record output Contact information: [address] [phone]         [facility], [facility].   Specialty: Home Health Services Contact information: [phone]        ByerStark Klein Follow up in 2 week(s).   Specialty: General Surgery Contact information: [phone]       Signed: FaerStark Klein/2021, 2:11 PM

## 2019-05-06 NOTE — Progress Notes (Signed)
Patient ID: [name], male   DOB: 1942/06/02, 74 y.o.   MRN: [id number]         [facility] for Infectious Disease  Date of Admission:  04/30/2019           Day 7 piperacillin tazobactam ASSESSMENT: He is improving on therapy for recurrent gallbladder fossa abscess.  Abscess cultures are probably growing an anaerobe.  I had planned on discharging him on vancomycin and ertapenem but the cost of the ertapenem is prohibitive for him.  I have now decided to discharge him on IV vancomycin and ceftriaxone and oral metronidazole (he does not drink alcohol).  PLAN: 1. Discharge on IV vancomycin, IV ceftriaxone and oral metronidazole  Diagnosis: Gallbladder fossa abscess  Culture Result: Pending  Allergies  Allergen Reactions  . Ciprofloxacin Rash  . Erythromycin Ethylsuccinate Rash    OPAT Orders Discharge antibiotics: Per pharmacy protocol IV vancomycin, IV ceftriaxone and oral metronidazole Aim for Vancomycin trough 15-20 or AUC 400-550 (unless otherwise indicated) Duration: 6 weeks End Date: 06/11/2019  [facility] Care Per Protocol:  Labs weekly while on IV antibiotics: _x_ CBC with differential _x_ BMP __ CMP __ CRP __ ESR _x_ Vancomycin trough __ CK  __ Please pull PIC at completion of IV antibiotics _x_ Please leave PIC in place until doctor has seen patient or been notified  Fax weekly labs to [phone]  Clinic Follow Up Appt: 05/26/2019  Principal Problem:   Abscess, intra-abdominal, postoperative Active Problems:   Essential hypertension   Hyperlipidemia   Hx of CABG   Coronary artery disease   Anemia   Non-insulin dependent type 2 diabetes mellitus ([place])   Protein-calorie malnutrition, severe   Scheduled Meds: . amiodarone  200 mg Oral Daily  . amLODipine  5 mg Oral Daily  . apixaban  5 mg Oral BID  . Chlorhexidine Gluconate Cloth  6 each Topical Daily  . loratadine  10 mg Oral Daily  . metoprolol tartrate  25 mg Oral BID  .  pantoprazole  40 mg Oral QHS  . sodium chloride flush  10-40 mL Intracatheter Q12H  . sodium chloride flush  5 mL Intracatheter Q8H   Continuous Infusions: . dextrose 5 % and 0.45 % NaCl with KCl 20 mEq/L Stopped (05/03/19 1344)  . ertapenem 1,000 mg (05/06/19 1210)  . vancomycin 1,500 mg (05/06/19 0954)   PRN Meds:.acetaminophen, alum & mag hydroxide-simeth, diphenhydrAMINE **OR** diphenhydrAMINE, HYDROmorphone (DILAUDID) injection, ondansetron **OR** ondansetron (ZOFRAN) IV, oxyCODONE, sodium chloride flush, zolpidem   SUBJECTIVE: He is having trouble sleeping but overall is feeling better.  He is eager to go home.  Review of Systems: Review of Systems  Constitutional: Negative for fever.  Gastrointestinal: Positive for abdominal pain. Negative for nausea and vomiting.       He has some mild discomfort around his right upper quadrant drain, especially when he takes a deep breath.    Allergies  Allergen Reactions  . Ciprofloxacin Rash  . Erythromycin Ethylsuccinate Rash    OBJECTIVE: Vitals:   05/01/19 1004 05/01/19 1009 05/01/19 1458 05/01/19 1854  BP: 132/69 125/63 (!) 117/56 120/63  Pulse: 82 85 70 69  Resp: '11 14 17 17  '$ Temp:   98.2 F (36.8 C) 98.1 F (36.7 C)  TempSrc:   Oral Oral  SpO2: 99% 99% 94% 93%  Weight:      Height:       Body mass index is 32.08 kg/m.  Physical Exam Constitutional:      Comments:  He is up walking in his room.  Abdominal:     Palpations: Abdomen is soft.     Tenderness: There is no abdominal tenderness.     Comments: He had 55 cc of output from his drain yesterday.     Lab Results Lab Results  Component Value Date   WBC 9.2 05/03/2019   HGB 9.0 (L) 05/03/2019   HCT 29.4 (L) 05/03/2019   MCV 91.6 05/03/2019   PLT 419 (H) 05/03/2019    Lab Results  Component Value Date   CREATININE 1.33 (H) 05/03/2019   BUN 7 (L) 05/03/2019   NA 132 (L) 05/03/2019   K 4.9 05/03/2019   CL 97 (L) 05/03/2019   CO2 20 (L) 05/03/2019      Lab Results  Component Value Date   ALT 19 04/11/2019   AST 18 04/11/2019   ALKPHOS 53 04/11/2019   BILITOT 1.4 (H) 04/11/2019     Microbiology: Recent Results (from the past 240 hour(s))  SARS CORONAVIRUS 2 (TAT 6-24 HRS) Nasopharyngeal Nasopharyngeal Swab     Status: None   Collection Time: 04/30/19  8:17 PM   Specimen: Nasopharyngeal Swab  Result Value Ref Range Status   SARS Coronavirus 2 NEGATIVE NEGATIVE Final    Comment: (NOTE) SARS-CoV-2 target nucleic acids are NOT DETECTED. The SARS-CoV-2 RNA is generally detectable in upper and lower respiratory specimens during the acute phase of infection. Negative results do not preclude SARS-CoV-2 infection, do not rule out co-infections with other pathogens, and should not be used as the sole basis for treatment or other patient management decisions. Negative results must be combined with clinical observations, patient history, and epidemiological information. The expected result is Negative. Fact Sheet for Patients: SugarRoll.be Fact Sheet for Healthcare Providers: https://www.woods-mathews.com/ This test is not yet approved or cleared by the [place] FDA and  has been authorized for detection and/or diagnosis of SARS-CoV-2 by FDA under an Emergency Use Authorization (EUA). This EUA will remain  in effect (meaning this test can be used) for the duration of the COVID-19 declaration under Section 56 4(b)(1) of the Act, 21 U.S.C. section 360bbb-3(b)(1), unless the authorization is terminated or revoked sooner. Performed at [facility], [place] [address], [place], [place] [address]   Aerobic/Anaerobic Culture (surgical/deep wound)     Status: None (Preliminary result)   Collection Time: 05/01/19 10:14 AM   Specimen: Abscess  Result Value Ref Range Status   Specimen Description ABSCESS  Final   Special Requests Normal  Final   Gram Stain   Final    FEW WBC PRESENT,  PREDOMINANTLY PMN ABUNDANT GRAM NEGATIVE RODS ABUNDANT GRAM VARIABLE ROD RARE GRAM POSITIVE COCCI    Culture   Final    HOLDING FOR POSSIBLE ANAEROBE Performed at [facility], [address] [place], [place] [address]    Report Status PENDING  Incomplete    Michel Bickers, MD [facility] for [place] [facility] [phone] pager   [phone] cell 05/06/2019, 1:02 PM

## 2019-05-07 ENCOUNTER — Other Ambulatory Visit: Payer: Self-pay | Admitting: General Surgery

## 2019-05-07 DIAGNOSIS — T8143XA Infection following a procedure, organ and space surgical site, initial encounter: Secondary | ICD-10-CM

## 2019-05-09 LAB — AEROBIC/ANAEROBIC CULTURE W GRAM STAIN (SURGICAL/DEEP WOUND): Special Requests: NORMAL

## 2019-05-14 ENCOUNTER — Encounter: Payer: Self-pay | Admitting: Internal Medicine

## 2019-05-18 ENCOUNTER — Encounter: Payer: Self-pay | Admitting: Internal Medicine

## 2019-05-20 ENCOUNTER — Encounter: Payer: Self-pay | Admitting: Cardiovascular Disease

## 2019-05-20 ENCOUNTER — Other Ambulatory Visit: Payer: Self-pay

## 2019-05-20 ENCOUNTER — Encounter

## 2019-05-20 ENCOUNTER — Encounter: Payer: Self-pay | Admitting: *Deleted

## 2019-05-20 ENCOUNTER — Ambulatory Visit: Payer: Medicare HMO | Admitting: Cardiovascular Disease

## 2019-05-20 DIAGNOSIS — E782 Mixed hyperlipidemia: Secondary | ICD-10-CM

## 2019-05-20 DIAGNOSIS — I483 Typical atrial flutter: Secondary | ICD-10-CM | POA: Diagnosis not present

## 2019-05-20 DIAGNOSIS — I10 Essential (primary) hypertension: Secondary | ICD-10-CM

## 2019-05-20 DIAGNOSIS — Z951 Presence of aortocoronary bypass graft: Secondary | ICD-10-CM | POA: Diagnosis not present

## 2019-05-20 NOTE — Assessment & Plan Note (Signed)
History of essential hypertension with blood pressure measured today 152/87.  He is on amlodipine and metoprolol.

## 2019-05-20 NOTE — Assessment & Plan Note (Signed)
History of postop A-flutter after bypass surgery currently in sinus rhythm on low-dose low-dose amiodarone and Eliquis.  We will discontinue these medications

## 2019-05-20 NOTE — Patient Instructions (Addendum)
Medication Instructions:  Stop taking Amiodarone Stop taking Eliquis  If you need a refill on your cardiac medications before your next appointment, please call your pharmacy.   Lab work: NONE  Testing/Procedures: 2 week Zio Patch  Follow-Up: At [facility], you and your health needs are our priority.  As part of our continuing mission to provide you with exceptional heart care, we have created designated Provider Care Teams.  These Care Teams include your primary Cardiologist (physician) and Advanced Practice Providers (APPs -  Physician Assistants and Nurse Practitioners) who all work together to provide you with the care you need, when you need it. You may see Nanetta Batty, MD or one of the following Advanced Practice Providers on your designated Care Team:    Corine Shelter, PA-C  [place], [place]  Edd Fabian, [place]  Your physician wants you to follow-up in: 6 months with a Physicians Assistant Your physician wants you to follow-up in: 1 year with Dr. Allyson Sabal    Your physician has recommended that you wear a 14 DAY ZIO-PATCH monitor. The Zio patch cardiac monitor continuously records heart rhythm data for up to 14 days, this is for patients being evaluated for multiple types heart rhythms. For the first 24 hours post application, please avoid getting the Zio monitor wet in the shower or by excessive sweating during exercise. After that, feel free to carry on with regular activities. Keep soaps and lotions away from the ZIO XT Patch.  This will be mailed to you, please expect 7-10 days to receive.

## 2019-05-20 NOTE — Progress Notes (Signed)
05/20/2019 [name]   1942-08-04  [id number]  Primary Physician Calvin Spanish, MD Primary Cardiologist: Calvin Harp MD [name], [place]  HPI:  [name] is a 74 y.o.  moderately overweight widowed Caucasian male father of 2, grandfather of 6 granddaughters referred by Dr. Claudie Price for cardiac catheterization because of new onset exertional chest pain and an abnormal Myoview stress test.I last saw him in the office  01/20/2019.His risk factors include treated hypertension, diabetes and hyperlipidemia. He is never had a heart attack or stroke. There is no family history of heart disease. He is retired from working Engineer, technical sales on Emergency planning/management officer. He smoked remotely and stopped in 1982. He is fairly active and golfs several times a week. He gets chest pain after the first hole. Recent 2D echo was essentially normal and a Myoview stress test did show inferior nontransmural scar with moderate lateral ischemic changes.   He underwent outpatient radial diagnostic cath by myself on 02/27/2018 revealing significant two-vessel disease and a left dominant system in the LAD and circumflex. I referred him to Dr. Darcey Price who ultimately performed CABG x4 on 04/01/2018 with a LIMA to the LAD, vein to a ramus branch, obtuse marginal branch and PLA. Was delayed 2 weeks because of dental issue that needed to be addressed. His postop course was complicated. He was in the ICU for 11 days then transferred to the floor and ultimately to rehab. He was readmitted to the hospital with a liver abscess which underwent percutaneous drainage.  Since I saw him 4 months ago, he was readmitted to the hospital with recurrence of his liver abscess and duodenal abscess.  He has had abdominal surgery with Dr. Barry Price and currently has a liver drainage tube and is on antibiotics followed by Dr. Megan Price.  His heart has remained stable.  He denies chest pain or shortness of breath.  He was  on Eliquis and amiodarone in the past because of a flutter but his most recent EKG showed sinus rhythm.    Current Meds  Medication Sig  . acetaminophen (TYLENOL) 500 MG tablet Take 1,000 mg by mouth daily as needed for moderate pain.  Marland Kitchen amiodarone (PACERONE) 100 MG tablet Take 1 tablet (100 mg total) by mouth daily.  Marland Kitchen amLODipine (NORVASC) 5 MG tablet Take 5 mg by mouth daily.  Marland Kitchen aspirin EC 81 MG tablet Take 81 mg by mouth every evening.   . cefTRIAXone (ROCEPHIN) IVPB Inject 2 g into the vein daily. Indication:  Recurrent Intra-abdominal infection Last Day of Therapy:  06/12/2019 Labs - Once weekly:  CBC/D and BMP, Labs - Every other week:  ESR and CRP  . cholecalciferol (VITAMIN D) 25 MCG (1000 UT) tablet Take 1,000 Units by mouth 2 (two) times daily.   Marland Kitchen ELIQUIS 5 MG TABS tablet TAKE ONE TABLET BY MOUTH TWICE DAILY (Patient taking differently: Take 5 mg by mouth 2 (two) times daily. )  . Ferrous Sulfate (IRON PO) Take 325 mg by mouth 2 (two) times daily.   . finasteride (PROSCAR) 5 MG tablet Take 5 mg by mouth daily.  Marland Kitchen loratadine (CLARITIN) 10 MG tablet Take 10 mg by mouth daily.  . metFORMIN (GLUCOPHAGE) 1000 MG tablet Take 1,000 mg by mouth 2 (two) times daily.  . metoprolol tartrate (LOPRESSOR) 25 MG tablet TAKE ONE TABLET BY MOUTH TWICE DAILY (Patient taking differently: Take 25 mg by mouth 2 (two) times daily. )  . metroNIDAZOLE (FLAGYL) 500 MG  tablet Take 1 tablet (500 mg total) by mouth 3 (three) times daily.  . Multiple Vitamins-Minerals (MULTIVITAMIN ADULT PO) Take 1 tablet by mouth daily.  Marland Kitchen oxyCODONE (OXY IR/ROXICODONE) 5 MG immediate release tablet Take 1 tablet (5 mg total) by mouth every 6 (six) hours as needed for severe pain.  . vancomycin IVPB Inject 1,500 mg into the vein daily. Indication:  Recurrent Intra-abdominal infection  Last Day of Therapy:  06/12/2019 Labs - Sunday/Monday:  CBC/D, BMP, and vancomycin trough. Labs - Thursday:  BMP and vancomycin trough Labs -  Every other week:  ESR and CRP     Allergies  Allergen Reactions  . Ciprofloxacin Rash  . Erythromycin Ethylsuccinate Rash    Social History   Socioeconomic History  . Marital status: Widowed    Spouse name: Not on file  . Number of children: 2  . Years of education: Not on file  . Highest education level: Not on file  Occupational History  . Occupation: retired  Tobacco Use  . Smoking status: Former Smoker    Types: Cigarettes    Quit date: 1982    Years since quitting: 39.0  . Smokeless tobacco: Never Used  . Tobacco comment: quit 1982  Substance and Sexual Activity  . Alcohol use: Not Currently  . Drug use: Never  . Sexual activity: Not on file  Other Topics Concern  . 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   Social Determinants of Health   Financial Resource Strain:   . Difficulty of Paying Living Expenses: Not on file  Food Insecurity:   . Worried About Charity fundraiser in the Last Year: Not on file  . Ran Out of Food in the Last Year: Not on file  Transportation Needs:   . Lack of Transportation (Medical): Not on file  . Lack of Transportation (Non-Medical): Not on file  Physical Activity:   . Days of Exercise per Week: Not on file  . Minutes of Exercise per Session: Not on file  Stress:   . Feeling of Stress : Not on file  Social Connections:   . Frequency of Communication with Friends and Family: Not on file  . Frequency of Social Gatherings with Friends and Family: Not on file  . Attends Religious Services: Not on file  . Active Member of Clubs or Organizations: Not on file  . Attends Archivist Meetings: Not on file  . Marital Status: Not on file  Intimate Partner Violence:   . Fear of Current or Ex-Partner: Not on file  . Emotionally Abused: Not on file  . Physically Abused: Not on file  . Sexually Abused: Not on file     Review of Systems: General: negative for chills,  fever, night sweats or weight changes.  Cardiovascular: negative for chest pain, dyspnea on exertion, edema, orthopnea, palpitations, paroxysmal nocturnal dyspnea or shortness of breath Dermatological: negative for rash Respiratory: negative for cough or wheezing Urologic: negative for hematuria Abdominal: negative for nausea, vomiting, diarrhea, bright red blood per rectum, melena, or hematemesis Neurologic: negative for visual changes, syncope, or dizziness All other systems reviewed and are otherwise negative except as noted above.    Blood pressure (!) 152/87, pulse 83, temperature (!) 97 F (36.1 C), height _0  (1.803 m), weight 233 lb 9.6 oz (106 kg), SpO2 98 %.  General appearance: alert and no distress Neck: no adenopathy, no carotid bruit, no JVD,  supple, symmetrical, trachea midline and thyroid not enlarged, symmetric, no tenderness/mass/nodules Lungs: clear to auscultation bilaterally Heart: regular rate and rhythm, S1, S2 normal, no murmur, click, rub or gallop Extremities: extremities normal, atraumatic, no cyanosis or edema Pulses: 2+ and symmetric Skin: Skin color, texture, turgor normal. No rashes or lesions Neurologic: Alert and oriented X 3, normal strength and tone. Normal symmetric reflexes. Normal coordination and gait  EKG not performed today  ASSESSMENT AND PLAN:   Essential hypertension History of essential hypertension with blood pressure measured today 152/87.  He is on amlodipine and metoprolol.  Hyperlipidemia History of hyperlipidemia not on statin therapy followed by his PCP  Hx of CABG History of CAD status post diagnostic cath by myself 02/27/2018 revealing significant two-vessel disease in the left dominant system.  He had CABG x4 by Dr. Darcey Price 04/01/2018 with a LIMA to his LAD, vein to ramus branch, obtuse marginal branch and posterior lateral branch.  His postop course was prolonged.  He did have postop a flutter which has since converted to  sinus rhythm.  He denies chest pain or shortness of breath.  Atrial flutter ([place]) History of postop A-flutter after bypass surgery currently in sinus rhythm on low-dose low-dose amiodarone and Eliquis.  We will discontinue these medications      Calvin Harp MD [facility], [facility] 05/20/2019 11:12 AM

## 2019-05-20 NOTE — Assessment & Plan Note (Signed)
History of hyperlipidemia not on statin therapy followed by his PCP 

## 2019-05-20 NOTE — Assessment & Plan Note (Signed)
History of CAD status post diagnostic cath by myself 02/27/2018 revealing significant two-vessel disease in the left dominant system.  He had CABG x4 by Dr. Maren Beach 04/01/2018 with a LIMA to his LAD, vein to ramus branch, obtuse marginal branch and posterior lateral branch.  His postop course was prolonged.  He did have postop a flutter which has since converted to sinus rhythm.  He denies chest pain or shortness of breath.

## 2019-05-20 NOTE — Progress Notes (Signed)
Patient ID: [name], male   DOB: 1942/09/20, 74 y.o.   MRN: [id number] Patient enrolled for [employer] to mail a 14 day ZIO XT long term holter monitor to his home.

## 2019-05-21 ENCOUNTER — Encounter: Payer: Self-pay | Admitting: *Deleted

## 2019-05-21 ENCOUNTER — Encounter: Payer: Self-pay | Admitting: Internal Medicine

## 2019-05-21 ENCOUNTER — Encounter

## 2019-05-21 ENCOUNTER — Ambulatory Visit
Admission: RE | Admit: 2019-05-21 | Discharge: 2019-05-21 | Disposition: A | Discharge status: Home / Self Care | Payer: Medicare HMO | Source: Ambulatory Visit | Attending: General Surgery | Admitting: General Surgery

## 2019-05-21 ENCOUNTER — Ambulatory Visit
Admission: RE | Admit: 2019-05-21 | Discharge: 2019-05-21 | Disposition: A | Discharge status: Home / Self Care | Payer: Medicare HMO | Source: Ambulatory Visit | Attending: Radiology | Admitting: Radiology

## 2019-05-21 DIAGNOSIS — T8143XA Infection following a procedure, organ and space surgical site, initial encounter: Secondary | ICD-10-CM

## 2019-05-21 HISTORY — PX: IR RADIOLOGIST EVAL & MGMT: IMG5224

## 2019-05-21 MED ORDER — IOPAMIDOL (ISOVUE-300) INJECTION 61%
125.0000 mL | Freq: Once | INTRAVENOUS | Status: AC | PRN
Start: 2019-05-21 — End: 2019-05-21
  Administered 2019-05-21: 125 mL via INTRAVENOUS

## 2019-05-21 NOTE — Progress Notes (Signed)
Referring Physician(s): Byerly,F  Chief Complaint: The patient is seen in follow up today s/p drainage of a recurrent gallbladder fossa abscess on 05/01/2019  History of present illness: [name] is a 74 year old white male with history of acute gangrenous cholecystitis with associated multiloculated hepatic abscess and percutaneous cholecystostomy on 05/11/2018.  He underwent drain exchange on 06/27/2018.  He is status post cholecystectomy on 10/07/2018 with subsequent gallbladder fossa abscess drain placement on 11/01/2018.  The abscess cavity was then found to have a fistulous connection to the duodenum.  He underwent repair of the duodenal fistula on 04/07/2019 with placement of surgical drain which was pulled prior to his discharge from [facility] on 04/13/2019.  He underwent CT-guided drainage of a recurrent gallbladder fossa abscess on 05/04/2019.  Fluid cultures grew moderate Fusobacterium nucleatum and few Clostridium species.  He is currently on antibiotics.  He currently reports no fevers, chills, chest pain, dyspnea, cough, worsening abdominal/back pain, vomiting or abnormal bleeding.  He has had some intermittent nausea and some loose stools secondary to antibiotic treatment.  He has been flushing his drain once a day with 5 cc sterile normal saline.  Output has been minimal.  During flushes patient has noticed reflux of saline from drain insertion site.  He presents today for follow-up CT and drain evaluation.  He was seen by Dr. Barry Dienes this past Monday.   Past Medical History:  Diagnosis Date  . Allergies   . Anemia   . Anxiety   . CAD (coronary artery disease)   . Diabetes ([place])   . Dyspnea    upon exertion  . Gout   . Hepatitis    at 74 years old  . Hyperlipemia   . Hypertension     Past Surgical History:  Procedure Laterality Date  . CHOLECYSTECTOMY N/A 10/07/2018   Procedure: Laparoscopic Cholecystectomy;  Surgeon: [name], MD;  Location: [place];  Service: General;  Laterality:  N/A;  . CORONARY ARTERY BYPASS GRAFT N/A 03/31/2018   Procedure: CORONARY ARTERY BYPASS GRAFTING (CABG) TIMES FOUR USING LEFT INTERNAL MAMMARY ARTERY AND RIGHT GREATER SAPHENOUS VEIN HARVESTED ENDOSCOPICALLY.;  Surgeon: Ivin Poot, MD;  Location: [place];  Service: Open Heart Surgery;  Laterality: N/A;  . DUODENAL FISTULA REPAIR  04/07/2019   REPAIR OF DUODENAL FISTULA (N/A Abdomen)  . IR CATHETER TUBE CHANGE  12/09/2018  . IR CATHETER TUBE CHANGE  02/06/2019  . IR EXCHANGE BILIARY DRAIN  06/27/2018  . IR PERC CHOLECYSTOSTOMY  05/11/2018  . IR RADIOLOGIST EVAL & MGMT  06/11/2018  . IR RADIOLOGIST EVAL & MGMT  11/18/2018  . IR RADIOLOGIST EVAL & MGMT  12/03/2018  . LEFT HEART CATH AND CORONARY ANGIOGRAPHY N/A 02/27/2018   Procedure: LEFT HEART CATH AND CORONARY ANGIOGRAPHY;  Surgeon: Lorretta Harp, MD;  Location: [place] [facility];  Service: Cardiovascular;  Laterality: N/A;  . liver absess  2010  . TEE WITHOUT CARDIOVERSION N/A 03/31/2018   Procedure: TRANSESOPHAGEAL ECHOCARDIOGRAM (TEE);  Surgeon: Prescott Gum, Collier Salina, MD;  Location: [place];  Service: Open Heart Surgery;  Laterality: N/A;    Allergies: Ciprofloxacin and Erythromycin ethylsuccinate  Medications: Prior to Admission medications   Medication Sig Start Date End Date Taking? Authorizing Provider  acetaminophen (TYLENOL) 500 MG tablet Take 1,000 mg by mouth daily as needed for moderate pain.    [provider]  amiodarone (PACERONE) 100 MG tablet Take 1 tablet (100 mg total) by mouth daily. 01/20/19   Lorretta Harp, MD  amiodarone (  PACERONE) 200 MG tablet Take 200 mg by mouth daily.    [provider]  amLODipine (NORVASC) 5 MG tablet Take 5 mg by mouth daily. 02/05/18   [provider]  aspirin EC 81 MG tablet Take 81 mg by mouth every evening.     [provider]  cefTRIAXone (ROCEPHIN) IVPB Inject 2 g into the vein daily. Indication:  Recurrent Intra-abdominal infection Last Day of Therapy:   06/12/2019 Labs - Once weekly:  CBC/D and BMP, Labs - Every other week:  ESR and CRP 05/06/19 06/12/19  [name], MD  cholecalciferol (VITAMIN D) 25 MCG (1000 UT) tablet Take 1,000 Units by mouth 2 (two) times daily.     [provider]  ELIQUIS 5 MG TABS tablet TAKE ONE TABLET BY MOUTH TWICE DAILY Patient taking differently: Take 5 mg by mouth 2 (two) times daily.  06/03/18   Lorretta Harp, MD  Ferrous Sulfate (IRON PO) Take 325 mg by mouth 2 (two) times daily.     [provider]  finasteride (PROSCAR) 5 MG tablet Take 5 mg by mouth daily. 02/05/18   [provider]  loratadine (CLARITIN) 10 MG tablet Take 10 mg by mouth daily.    [provider]  metFORMIN (GLUCOPHAGE) 1000 MG tablet Take 1,000 mg by mouth 2 (two) times daily. 02/05/18   [provider]  metoprolol tartrate (LOPRESSOR) 25 MG tablet TAKE ONE TABLET BY MOUTH TWICE DAILY Patient taking differently: Take 25 mg by mouth 2 (two) times daily.  03/02/19   Lorretta Harp, MD  metroNIDAZOLE (FLAGYL) 500 MG tablet Take 1 tablet (500 mg total) by mouth 3 (three) times daily. 05/06/19 06/12/19  [name], MD  Multiple Vitamins-Minerals (MULTIVITAMIN ADULT PO) Take 1 tablet by mouth daily.    [provider]  oxyCODONE (OXY IR/ROXICODONE) 5 MG immediate release tablet Take 1 tablet (5 mg total) by mouth every 6 (six) hours as needed for severe pain. 05/06/19   [name], MD  vancomycin IVPB Inject 1,500 mg into the vein daily. Indication:  Recurrent Intra-abdominal infection  Last Day of Therapy:  06/12/2019 Labs - Sunday/Monday:  CBC/D, BMP, and vancomycin trough. Labs - Thursday:  BMP and vancomycin trough Labs - Every other week:  ESR and CRP 05/06/19 06/12/19  [name], MD     Family History  Problem Relation Age of Onset  . Atrial fibrillation Mother   . Stroke Mother   . Cancer Maternal Aunt     Social History   Socioeconomic History  . Marital status: Widowed      Spouse name: Not on file  . Number of children: 2  . Years of education: Not on file  . Highest education level: Not on file  Occupational History  . Occupation: retired  Tobacco Use  . Smoking status: Former Smoker    Types: Cigarettes    Quit date: 1982    Years since quitting: 39.0  . Smokeless tobacco: Never Used  . Tobacco comment: quit 1982  Substance and Sexual Activity  . Alcohol use: Not Currently  . Drug use: Never  . Sexual activity: Not on file  Other Topics Concern  . 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   Social Determinants of Health   Financial Resource Strain:   . Difficulty of Paying Living Expenses: Not on file  Food Insecurity:   . Worried About Charity fundraiser  in the Last Year: Not on file  . Ran Out of Food in the Last Year: Not on file  Transportation Needs:   . Lack of Transportation (Medical): Not on file  . Lack of Transportation (Non-Medical): Not on file  Physical Activity:   . Days of Exercise per Week: Not on file  . Minutes of Exercise per Session: Not on file  Stress:   . Feeling of Stress : Not on file  Social Connections:   . Frequency of Communication with Friends and Family: Not on file  . Frequency of Social Gatherings with Friends and Family: Not on file  . Attends Religious Services: Not on file  . Active Member of Clubs or Organizations: Not on file  . Attends Archivist Meetings: Not on file  . Marital Status: Not on file     Vital Signs: Vital signs stable, afebrile   Physical Exam awake, alert.  Chest clear to auscultation bilaterally.  Heart with regular rate and rhythm.  Abdomen soft, positive bowel sounds, right upper abdominal drain intact, insertion site okay, minimal tenderness to palpation.  Imaging: No results found.  Labs:  CBC: Recent Labs    04/12/19 0242 04/13/19 0122 05/01/19 0222 05/03/19 0215  WBC 5.9 6.1 11.7* 9.2  HGB  8.4* 8.4* 9.2* 9.0*  HCT 25.7* 25.7* 28.5* 29.4*  PLT 206 245 390 419*    COAGS: Recent Labs    10/06/18 0903 04/07/19 0849 04/08/19 0240 05/01/19 0222  INR 0.9 1.0 1.1 1.2    BMP: Recent Labs    04/09/19 0957 04/11/19 0326 05/01/19 0222 05/03/19 0215  NA 133* 135 132* 132*  K 4.7 3.7 4.5 4.9  CL 103 100 94* 97*  CO2 _0 20*  GLUCOSE 151* 128* 125* 134*  BUN _1 7*  CALCIUM 8.6* 8.4* 9.5 9.0  CREATININE 1.31* 1.13 1.51* 1.33*  GFRNONAA 52* >60 44* 51*  GFRAA >60 >60 51* 59*    LIVER FUNCTION TESTS: Recent Labs    11/19/18 2010 04/01/19 1155 04/08/19 0240 04/11/19 0326  BILITOT 0.6 0.3 0.8 1.4*  AST 24 27 61* 18  ALT 22 23 54* 19  ALKPHOS 62 63 47 53  PROT 7.5 6.8 5.9* 5.2*  ALBUMIN 4.0 3.8 2.9* 2.4*    Assessment: 74 year old white male with history of acute gangrenous cholecystitis with associated multiloculated hepatic abscess and percutaneous cholecystostomy on 05/11/2018.  He underwent drain exchange on 06/27/2018.  He is status post cholecystectomy on 10/07/2018 with subsequent gallbladder fossa abscess drain placement on 11/01/2018.  The abscess cavity was then found to have a fistulous connection to the duodenum.  He underwent repair of the duodenal fistula on 04/07/2019 with placement of surgical drain which was pulled prior to his discharge from [facility] on 04/13/2019.  He underwent CT-guided drainage of a recurrent gallbladder fossa abscess on 05/04/2019.  Fluid cultures grew moderate Fusobacterium nucleatum and few Clostridium species.  He is currently on antibiotics and followed by ID service.   He currently reports no fevers, chills, chest pain, dyspnea, cough, worsening abdominal/back pain, vomiting or abnormal bleeding.  He has had some intermittent nausea and some loose stools secondary to antibiotic treatment.  He has been flushing his drain once a day with 5 cc sterile normal saline.  Output has been minimal.  During flushes patient has noticed reflux of  saline from drain insertion site.  Preliminary findings from follow-up CT today shows minimal fluid around existing drainage catheter in gallbladder  fossa.  Findings discussed with Dr. Kathlene Cote and decision made to remove right upper quadrant drain.  Right upper quadrant abdominal drain removed in its entirety without immediate complications.  Gauze dressing applied over site.  Site care instructions reviewed with patient.  Patient will continue follow-up with Dr. Barry Dienes as scheduled.   Signed: D. Rowe Robert, PA-C 05/21/2019, 9:49 AM   Please refer to Dr. Margaretmary Dys attestation of this note for management and plan.      Patient ID: [name], male   DOB: Mar 03, 1943, 74 y.o.   MRN: [id number]

## 2019-05-23 ENCOUNTER — Encounter

## 2019-05-23 ENCOUNTER — Ambulatory Visit (INDEPENDENT_AMBULATORY_CARE_PROVIDER_SITE_OTHER): Payer: Medicare HMO

## 2019-05-23 DIAGNOSIS — I483 Typical atrial flutter: Secondary | ICD-10-CM

## 2019-05-26 ENCOUNTER — Encounter

## 2019-05-26 ENCOUNTER — Ambulatory Visit: Payer: Medicare HMO | Admitting: Internal Medicine

## 2019-05-26 ENCOUNTER — Other Ambulatory Visit: Payer: Self-pay

## 2019-05-26 ENCOUNTER — Encounter: Payer: Self-pay | Admitting: Internal Medicine

## 2019-05-26 ENCOUNTER — Telehealth: Payer: Self-pay | Admitting: *Deleted

## 2019-05-26 DIAGNOSIS — T8143XA Infection following a procedure, organ and space surgical site, initial encounter: Secondary | ICD-10-CM

## 2019-05-26 MED ORDER — AMOXICILLIN-POT CLAVULANATE 875-125 MG PO TABS
1.0000 | Freq: Two times a day (BID) | ORAL | 0 refills | Status: DC
Start: 2019-05-26 — End: 2019-06-23

## 2019-05-26 NOTE — Progress Notes (Signed)
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  Previous Medications   ACETAMINOPHEN (TYLENOL) 500 MG TABLET    Take 1,000 mg by mouth daily as needed for moderate pain.   AMIODARONE (PACERONE) 200 MG TABLET    Take 200 mg by mouth daily.   ASPIRIN EC 81 MG TABLET    Take 81 mg by mouth every evening.    CHOLECALCIFEROL (VITAMIN D) 25 MCG (1000 UT) TABLET    Take 1,000 Units by mouth 2 (two) times daily.    FERROUS SULFATE (IRON PO)    Take 325 mg by mouth 2 (two) times daily.    FINASTERIDE (PROSCAR) 5 MG TABLET    Take 5 mg by mouth daily.   LORATADINE (CLARITIN) 10 MG TABLET    Take 10 mg by mouth daily.   METFORMIN (GLUCOPHAGE) 1000 MG TABLET    Take 1,000 mg by mouth 2 (two) times daily.   METOPROLOL TARTRATE (LOPRESSOR) 25 MG TABLET    TAKE ONE TABLET BY MOUTH TWICE DAILY   MULTIPLE VITAMINS-MINERALS (MULTIVITAMIN ADULT PO)    Take 1 tablet by mouth daily.   OXYCODONE (OXY IR/ROXICODONE) 5 MG IMMEDIATE RELEASE TABLET    Take 1 tablet (5 mg total) by mouth every 6 (six) hours as needed for severe pain.   VANCOMYCIN IVPB    Inject 1,500 mg into the vein daily. Indication:  Recurrent Intra-abdominal infection  Last Day of Therapy:  06/12/2019 Labs - Sunday/Monday:  CBC/D, BMP, and vancomycin  trough. Labs - Thursday:  BMP and vancomycin trough Labs - Every other week:  ESR and CRP  Modified Medications   No medications on file  Discontinued Medications   AMIODARONE (PACERONE) 100 MG TABLET    Take 1 tablet (100 mg total) by mouth daily.   AMLODIPINE (NORVASC) 5 MG TABLET    Take 5 mg by mouth daily.   CEFTRIAXONE (ROCEPHIN) IVPB    Inject 2 g into the vein daily. Indication:  Recurrent Intra-abdominal infection Last Day of Therapy:  06/12/2019 Labs - Once weekly:  CBC/D and BMP, Labs - Every other week:  ESR and CRP   ELIQUIS 5 MG TABS TABLET    TAKE ONE TABLET BY MOUTH TWICE DAILY   METRONIDAZOLE (FLAGYL) 500 MG TABLET    Take 1 tablet (500 mg total) by mouth 3 (three) times daily.    Subjective: [name] is in for his hospital follow-up visit.  He underwent CABG in December 2018.  He was recovering uneventfully until he developed acute cholecystitis leading to readmission in January of last year.  Abscesses were noted in the gallbladder fossa and liver.  Abscess cultures grew E. coli, Klebsiella and staph capitis.  He was discharged with a percutaneous drain on IV ceftriaxone and metronidazole.  He transitioned to  oral amoxicillin clavulanate.  He improved, his drain was removed and my partner, [name], stopped his antibiotic on 06/24/2018.  COVID-19 pandemic delayed surgery but he eventually underwent laparoscopic cholecystectomy on 10/07/2018.  He was readmitted on 10/31/2018 with recurrent liver abscess.  A new percutaneous drain was placed and drain cultures grew Klebsiella, Enterococcus and Prevotella.  He was discharged on oral trimethoprim sulfamethoxazole and amoxicillin clavulanate.  He is not sure when he stopped antibiotics but he says that he did improve.  His drain was left in.  He was readmitted again on 04/07/2019 with an acute duodenal fistula.  He underwent exploratory laparotomy and omental flap repair.  His drains were removed on 04/15/2019 and he was discharged  home off of antibiotics.  He began to develop malaise, fatigue and abdominal pain leading to readmission on 04/30/2019.  CT scan revealed a recurrent abscess in the gallbladder fossa measuring 5.4 x 4.7 cm.  100 cc of pus was aspirated and a another percutaneous drain was placed.  He was started on empiric piperacillin tazobactam.  Abscess Gram stain shows gram-negative rods, gram variable rods and gram-positive cocci.  Cultures grew Fusobacterium and Clostridium.   He was discharged home on IV ceftriaxone and oral metronidazole.  He has now completed 27 days of antibiotic therapy.  Follow-up CT scan last week showed that the abscess had resolved.  His drain was removed on 05/21/2019.  He is feeling better with the exception of stomach upset due to metronidazole.  He says that food does not taste good.  He has been having frequent soft stools.    Of note he was hospitalized at an outside hospital in June 2011 with a liver abscess.  Abscess cultures at that time grew group D strep and Klebsiella.  Review of Systems: Review of Systems  Constitutional: Positive for malaise/fatigue and weight loss. Negative for chills, diaphoresis and fever.  Respiratory: Negative for cough, sputum production and shortness of breath.   Cardiovascular: Negative for chest pain.  Gastrointestinal: Negative for abdominal pain, diarrhea, nausea and vomiting.    Past Medical History:  Diagnosis Date   Allergies    Anemia    Anxiety    CAD (coronary artery disease)    Diabetes ([place])    Dyspnea    upon exertion   Gout    Hepatitis    at 74 years old   Hyperlipemia    Hypertension     Social History   Tobacco Use   Smoking status: Former Smoker    Types: Cigarettes    Quit date: 1982    Years since quitting: 39.0   Smokeless tobacco: Never Used   Tobacco comment: quit 1982  Substance Use Topics   Alcohol use: Not Currently   Drug use: Never    Family History  Problem Relation Age of Onset     Atrial fibrillation Mother    Stroke Mother    Cancer Maternal Aunt     Allergies  Allergen Reactions   Ciprofloxacin Rash   Erythromycin Ethylsuccinate Rash    Objective: Vitals:   05/26/19 1345  BP: (!) 145/88  Pulse: 67  Temp: 98.1 F (36.7 C)  TempSrc: Oral  SpO2: 100%  Weight: 234 lb (106.1 kg)  Height: '5\' 11"'$  (1.803 m)   Body mass index is 32.64 kg/m.  Physical Exam Constitutional:      Comments: He is pleasant and talkative.  Cardiovascular:     Rate and Rhythm: Normal rate and regular rhythm.  Heart sounds: No murmur.     Comments: He has a cardiac event monitor on his left upper chest wall. Pulmonary:     Effort: Pulmonary effort is normal.     Breath sounds: Normal breath sounds.  Abdominal:     General: There is no distension.     Palpations: Abdomen is soft.     Tenderness: There is no abdominal tenderness.     Comments: His midline incision is healed nicely.  Musculoskeletal:     Right lower leg: Edema present.     Left lower leg: Edema present.     Comments: He recently developed pitting edema of his lower legs and feet, right greater than left.  There is no redness or palpable cords.  Psychiatric:        Mood and Affect: Mood normal.     Lab Results    Problem List Items Addressed This Visit      High   Abscess, intra-abdominal, postoperative    I discussed management options with him including stopping antibiotic therapy now or stopping his current antibiotic regimen and switching to oral amoxicillin clavulanate for 2 more weeks.  He favors the latter option.  He will follow-up here in 4 weeks.      Relevant Medications   amoxicillin-clavulanate (AUGMENTIN) 875-125 MG tablet       Calvin Bickers, MD [facility] [place] [phone] pager   [phone] cell 05/26/2019, 2:10 PM

## 2019-05-26 NOTE — Assessment & Plan Note (Signed)
I discussed management options with him including stopping antibiotic therapy now or stopping his current antibiotic regimen and switching to oral amoxicillin clavulanate for 2 more weeks.  He favors the latter option.  He will follow-up here in 4 weeks.

## 2019-05-26 NOTE — Telephone Encounter (Signed)
Per Dr Orvan Falconer, relayed verbal orders to stop IV antibiotics and pull PICC today to [facility].  Orders repeated and verified.  Paitent aware. Andree Coss, RN

## 2019-05-27 NOTE — Telephone Encounter (Signed)
Thanks [name] 

## 2019-06-01 ENCOUNTER — Telehealth: Payer: Self-pay | Admitting: Cardiovascular Disease

## 2019-06-01 NOTE — Telephone Encounter (Signed)
Pt c/o swelling: STAT is pt has developed SOB within 24 hours  1) How much weight have you gained and in what time span? 3 pounds in a week   2) If swelling, where is the swelling located? Right foot and ankle  3) Are you currently taking a fluid pill? no  4) Are you currently SOB? no  5) Do you have a log of your daily weights (if so, list)?  1/24: 226  1/25: 227  1/26: 227  1/27: 226  1/28: 227  1/29: 228  1/30: 229  1/31: 229  2/01: 229   6) Have you gained 3 pounds in a day or 5 pounds in a week? no  7) Have you traveled recently? no   Patient was taken off of Amiodarone and Eliquis - not sure if this is the reason. His Infectious Disease Dr recommended he contact Dr. Allyson Sabal.

## 2019-06-01 NOTE — Telephone Encounter (Signed)
[id number] 

## 2019-06-02 NOTE — Telephone Encounter (Signed)
Called patient back, LVM with call back number.

## 2019-06-02 NOTE — Telephone Encounter (Signed)
Patient returning call.

## 2019-06-03 NOTE — Telephone Encounter (Signed)
Follow up   Patient states that he would like a call back. Please call.

## 2019-06-03 NOTE — Telephone Encounter (Signed)
Left a message for the patient to call back.  

## 2019-06-03 NOTE — Telephone Encounter (Signed)
Pt called to report that he saw Infectious Disease a few days ago and he showed them his right foot has been swelling and they advised him that if it does not improve or worsens to call Dr. Allyson Sabal. He says it is not worsening but staying the same.   Pt says his weight has been stable, he is feeling well. But his right foot and about 3-4 inches above his ankle has been swollen for 5 days.    No change in color, no heat, and can still wear his normal shoes but it is puffy. No claudication. He has H/O peripheral neuropathy so he normally has "tingling" in his extremities and this does not seem to be worse than usual.   Pt recently had his PICC line and abdominal tubes removed by Infectious Disease. He has not had any other changes. Except stopping his Amiodarone and Eliquis 05/20/19 per Dr. Allyson Sabal.   Will forward to Dr. Allyson Sabal for recommendations.

## 2019-06-23 ENCOUNTER — Encounter: Payer: Self-pay | Admitting: Internal Medicine

## 2019-06-23 ENCOUNTER — Ambulatory Visit: Payer: Medicare HMO | Admitting: Internal Medicine

## 2019-06-23 ENCOUNTER — Other Ambulatory Visit: Payer: Self-pay

## 2019-06-23 ENCOUNTER — Encounter

## 2019-06-23 DIAGNOSIS — T8143XA Infection following a procedure, organ and space surgical site, initial encounter: Secondary | ICD-10-CM

## 2019-06-23 NOTE — Assessment & Plan Note (Signed)
He is improving 2 weeks after completing antibiotic therapy for his latest gallbladder fossa abscess.  He will follow-up here in 4 weeks.

## 2019-06-23 NOTE — Progress Notes (Signed)
[facility] for [facility]  Patient Active Problem List   Diagnosis Date Noted  . Abscess, intra-abdominal, postoperative 10/31/2018    Priority: High  . 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 AKI (acute kidney injury) ([place]) 11/01/2018  . RBBB 11/01/2018  . Protein-calorie malnutrition, severe 05/10/2018  . Acute diverticulitis 05/09/2018  . Atrial flutter ([place]) 05/01/2018  . Anemia 05/01/2018  . Non-insulin dependent type 2 diabetes mellitus ([place]) 05/01/2018  . Coronary artery disease 03/31/2018  . Hx of CABG   . Essential hypertension 02/25/2018  . Hyperlipidemia 02/25/2018    Patient's Medications  New Prescriptions   No medications on file  Previous Medications   ACETAMINOPHEN (TYLENOL) 500 MG TABLET    Take 1,000 mg by mouth daily as needed for moderate pain.   AMIODARONE (PACERONE) 200 MG TABLET    Take 200 mg by mouth daily.   ASPIRIN EC 81 MG TABLET    Take 81 mg by mouth every evening.    CHOLECALCIFEROL (VITAMIN D) 25 MCG (1000 UT) TABLET    Take 1,000 Units by mouth 2 (two) times daily.    FERROUS SULFATE (IRON PO)    Take 325 mg by mouth 2 (two) times daily.    FINASTERIDE (PROSCAR) 5 MG TABLET    Take 5 mg by mouth daily.   LORATADINE (CLARITIN) 10 MG TABLET    Take 10 mg by mouth daily.   METFORMIN (GLUCOPHAGE) 1000 MG TABLET    Take 1,000 mg by mouth 2 (two) times daily.   METOPROLOL TARTRATE (LOPRESSOR) 25 MG TABLET    TAKE ONE TABLET BY MOUTH TWICE DAILY   MULTIPLE VITAMINS-MINERALS (MULTIVITAMIN ADULT PO)    Take 1 tablet by mouth daily.   OXYCODONE (OXY IR/ROXICODONE) 5 MG IMMEDIATE RELEASE TABLET    Take 1 tablet (5 mg total) by mouth every 6 (six) hours as needed for severe pain.  Modified Medications   No medications on file  Discontinued Medications   AMOXICILLIN-CLAVULANATE (AUGMENTIN) 875-125 MG TABLET    Take 1 tablet by mouth 2 (two) times daily.    Subjective: [name] is in for his hospital follow-up visit.  He underwent CABG  in December 2018.  He was recovering uneventfully until he developed acute cholecystitis leading to readmission in January of last year.  Abscesses were noted in the gallbladder fossa and liver.  Abscess cultures grew E. coli, Klebsiella and staph capitis.  He was discharged with a percutaneous drain on IV ceftriaxone and metronidazole.  He transitioned to oral amoxicillin clavulanate.  He improved, his drain was removed and my partner, Dr. Talbot Grumbling, stopped his antibiotic on 06/24/2018.  COVID-19 pandemic delayed surgery but he eventually underwent laparoscopic cholecystectomy on 10/07/2018.  He was readmitted on 10/31/2018 with recurrent liver abscess.  A new percutaneous drain was placed and drain cultures grew Klebsiella, Enterococcus and Prevotella.  He was discharged on oral trimethoprim sulfamethoxazole and amoxicillin clavulanate.  He is not sure when he stopped antibiotics but he says that he did improve.  His drain was left in.  He was readmitted again on 04/07/2019 with an acute duodenal fistula.  He underwent exploratory laparotomy and omental flap repair.  His drains were removed on 04/15/2019 and he was discharged home off of antibiotics.  He began to develop malaise, fatigue and abdominal pain leading to readmission on 04/30/2019.  CT scan revealed a recurrent abscess in the gallbladder fossa measuring 5.4 x 4.7 cm.  100 cc of  pus was aspirated and a another percutaneous drain was placed.  He was started on empiric piperacillin tazobactam.  Abscess Gram stain shows gram-negative rods, gram variable rods and gram-positive cocci.  Cultures grew Fusobacterium and Clostridium.   He was discharged home on IV ceftriaxone and oral metronidazole.  He has now completed 27 days of antibiotic therapy.  Follow-up CT scan last week showed that the abscess had resolved.  His drain was removed on 05/21/2019.  He completed 27 days of IV ceftriaxone and metronidazole on 05/26/2019 and then transition to 2 weeks of oral  amoxicillin clavulanate which he finished 2 weeks ago.  He is feeling better than he has felt past year.  The metallic taste associated with metronidazole has resolved and he is eating better.  His bowel movements are back to normal.  He is not having any abdominal pain.  He is hoping to start cardiac rehab soon.   Review of Systems: Review of Systems  Constitutional: Positive for malaise/fatigue. Negative for chills, diaphoresis, fever and weight loss.  Respiratory: Negative for cough, sputum production and shortness of breath.   Cardiovascular: Negative for chest pain.  Gastrointestinal: Negative for abdominal pain, diarrhea, nausea and vomiting.    Past Medical History:  Diagnosis Date  . Allergies   . Anemia   . Anxiety   . CAD (coronary artery disease)   . Diabetes ([facility])   . Dyspnea    upon exertion  . Gout   . Hepatitis    at 74 years old  . Hyperlipemia   . Hypertension     Social History   Tobacco Use  . Smoking status: Former Smoker    Types: Cigarettes    Quit date: 1982    Years since quitting: 39.1  . Smokeless tobacco: Never Used  . Tobacco comment: quit 1982  Substance Use Topics  . Alcohol use: Not Currently  . Drug use: Never    Family History  Problem Relation Age of Onset  . Atrial fibrillation Mother   . Stroke Mother   . Cancer Maternal Aunt     Allergies  Allergen Reactions  . Ciprofloxacin Rash  . Erythromycin Ethylsuccinate Rash    Objective: Vitals:   06/23/19 1336  BP: (!) 161/89  Pulse: 76  Temp: 98 F (36.7 C)  TempSrc: Oral  Weight: 241 lb (109.3 kg)   Body mass index is 33.61 kg/m.  Physical Exam Constitutional:      Comments: He is pleasant and talkative.  Cardiovascular:     Rate and Rhythm: Normal rate and regular rhythm.     Heart sounds: No murmur.  Pulmonary:     Effort: Pulmonary effort is normal.     Breath sounds: Normal breath sounds.  Abdominal:     General: There is no distension.     Palpations:  Abdomen is soft.     Tenderness: There is no abdominal tenderness.     Comments: His midline incision is healed nicely.  He is slightly tender along his abdominal incision.  Musculoskeletal:     Right lower leg: Edema present.     Left lower leg: Edema present.     Comments: His lower extremity edema has improved somewhat.  Psychiatric:        Mood and Affect: Mood normal.     Lab Results    Problem List Items Addressed This Visit      High   Abscess, intra-abdominal, postoperative    He is improving 2 weeks after  completing antibiotic therapy for his latest gallbladder fossa abscess.  He will follow-up here in 4 weeks.          Calvin Asters, MD [facility] for Infectious Disease [facility] [phone] pager   [phone] cell 06/23/2019, 2:01 PM

## 2019-10-08 ENCOUNTER — Telehealth: Payer: Self-pay | Admitting: Cardiology

## 2019-10-08 NOTE — Telephone Encounter (Signed)
I attempted to contact patient 10/08/19 to schedule 6 mos follow up visit with [facility] from patients recall list. The patient didn't answer so I left message for patient to call in to get that appointment scheduled.

## 2019-10-19 ENCOUNTER — Telehealth: Payer: Self-pay | Admitting: Cardiovascular Disease

## 2019-10-19 NOTE — Telephone Encounter (Signed)
I attempted to contact patient 10/19/19 to schedule follow up visit from patients recall list with [facility]. The patient didn't answer so I left message for patient to return call to get appt scheduled.

## 2019-11-09 ENCOUNTER — Encounter: Payer: Self-pay | Admitting: General Practice

## 2019-12-15 ENCOUNTER — Other Ambulatory Visit: Payer: Self-pay | Admitting: Surgery

## 2019-12-15 DIAGNOSIS — R1900 Intra-abdominal and pelvic swelling, mass and lump, unspecified site: Secondary | ICD-10-CM

## 2019-12-28 ENCOUNTER — Ambulatory Visit
Admission: RE | Admit: 2019-12-28 | Discharge: 2019-12-28 | Disposition: A | Discharge status: Home / Self Care | Payer: Medicare HMO | Source: Ambulatory Visit | Attending: Surgery | Admitting: Surgery

## 2019-12-28 ENCOUNTER — Other Ambulatory Visit: Payer: Self-pay

## 2019-12-28 ENCOUNTER — Encounter

## 2019-12-28 DIAGNOSIS — R1900 Intra-abdominal and pelvic swelling, mass and lump, unspecified site: Secondary | ICD-10-CM

## 2019-12-28 MED ORDER — IOPAMIDOL (ISOVUE-300) INJECTION 61%
100.0000 mL | Freq: Once | INTRAVENOUS | Status: AC | PRN
Start: 2019-12-28 — End: 2019-12-28
  Administered 2019-12-28: 100 mL via INTRAVENOUS

## 2020-01-11 ENCOUNTER — Ambulatory Visit: Payer: Self-pay | Admitting: Surgery

## 2020-01-11 NOTE — H&P (Signed)
History of Present Illness [name]. Camiyah Friberg MD; 01/11/2020 11:51 AM) The patient is a 74 year old male who presents with an incisional hernia. Former patient of Dr. Donell Beers  "Patient is a 74 year old male who our service saw in the hospital for cholecystitis and adjacent liver abscess. He received a percutaneous cholecystostomy tube 05/11/18. Patient's history is notable for having a cardiac cath in the fall of 2019 demonstrating 4 blockages. He was scheduled for a CABG and then was found to have a tooth abscess. He eventually had CABG x4 on March 31, 2018. His postop course was complicated by atrial fibrillation and by aspiration pneumonia. He went home for short period of time but came back quite ill. He was seen to have a large liver abscess as well as an inflamed appearing gallbladder with a thickened gallbladder wall. Neoplasm was not excluded. He required IV antibiotics via PICC line at home for a while after discharge. He has been seen by infectious disease and just got his PICC line and IV antibiotics discontinued. Also of note, he was admitted to [facility] around 8 years ago with significant gallbladder infection as well. MR was performed and did not show any suspicion for gallbladder mass He got perc drain capped. He was set up for lap chole in march 2020, but it got delayed until June due to COVID. He required readmission for gallbladder fossa abscess and got perc drain. Since then he has been readmitted several times and has had to go to the ED for dehydration once. 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 His drain output came down quite a bit and he stopped flushing it. He was doing well. He accidentally cut through it so we were doing a trial of seeing what happened just dressing the drain terminus, but he started having lots of drainage around the tube so it was switched out for a new drain 12/09/2018.   He had surgery 04/07/19 for takedown and repair of the  duodenal fistula. He did relatively well post op. He went home on POD 6. His surgical drain was removed prior to his discharge as it was non bilious. Unfortunately, he developed significant nausea/vomiting and malaise. He got outpatient labs showing WBCs of 16k and a CT showing a repeat fluid collection. He was readmitted 12/31 and discharged 1/6 with new perc drain placed. This one was NON bilious, but grew fusobacterium nucleatum. Dr. Orvan Falconer saw him and recommended outpatient IV antibiotics.   Over the last month, his drain has been removed and he is now off antibiotics. His taste buds have finally started to come back. He still feels weak, but is slowly improving. His outdoor walking has been hampered by the bad weather. " Over the last 6 months, the patient has noted some bulging under his incision. This has enlarged. CT scan showed a broad shallow ventral hernia. He denies any pain or obstructive symptoms related to this. He is referred to me for evaluation for hernia repair. He is scheduled to see his cardiologist in the next month. He remains fairly active. He plays golf on a regular basis and goes to the gym 3 times a week.      Problem List/Past Medical Molli Hazard K. Shauntia Levengood, MD; 01/11/2020 11:51 AM) CALCULUS OF GB W ACUTE AND CHRONIC CHOLECYST W/O OBSTRUCTION (K80.12) ENCOUNTER FOR REMOVAL OF STAPLES (Z48.02) PRE-OPERATIVE DIAGNOSIS: Duodenal fistula  POST-OPERATIVE DIAGNOSIS: Same  PROCEDURE: Procedure(s): Repair of duodenal fistula with omental flap  SURGEON: Surgeon(s): [name],  MD  Assistant: Claud Kelp, MD  ANESTHESIA: general  DRAINS: (19 Fr) Blake drain(s) in the RUQ  LOCAL MEDICATIONS USED: NONE  SPECIMEN: No Specimen  DISPOSITION OF SPECIMEN: N/A  COUNTS: YES  DICTATION: .Dragon Dictation  PLAN OF CARE: Admit to inpatient  PATIENT DISPOSITION: PACU - hemodynamically stable.  FINDINGS: Fistula in the proximal second  portion of the duodenum GALLBLADDER ANOMALY (Q44.1) ABSCESS, GALLBLADDER (K81.0) ABDOMINAL MASS (R19.00) VENTRAL INCISIONAL HERNIA WITHOUT OBSTRUCTION OR GANGRENE (K43.2) DUODENAL FISTULA (K31.6) LIVER ABSCESS (K75.0)  Past Surgical History ([name] K. [name], MD; 01/11/2020 11:51 AM) Anal Fissure Repair Bypass Surgery for Poor Blood Flow to Legs Liver Surgery Tonsillectomy  Diagnostic Studies History [name]. [name], MD; 01/11/2020 11:51 AM) Colonoscopy 1-5 years ago  Allergies [name], RMA; 01/11/2020 10:14 AM) Cipro *FLUOROQUINOLONES* Difficulty breathing. Erythromycin *MACROLIDES* Allergies Reconciled  Medication History [name], RMA; 01/11/2020 10:14 AM) metFORMIN HCl (1000MG  Tablet, Oral) Active. Losartan Potassium (50MG  Tablet, Oral) Active. Eliquis (5MG  Tablet, Oral) Active. Nitroglycerin (0.4MG  Tab Sublingual, Sublingual) Active. Furosemide (40MG  Tablet, Oral) Active. Metoprolol Tartrate (25MG  Tablet, Oral) Active. Amiodarone HCl (200MG  Tablet, Oral) Active. Finasteride (5MG  Tablet, Oral) Active. Iron (325 (65 Fe)MG Tablet, 1 tab Oral two times daily) Active. amLODIPine Besylate (2.5MG  Tablet, Oral) Active.  Social History . [name], MD; 01/11/2020 11:51 AM) Alcohol use Occasional alcohol use. Caffeine use Coffee. No drug use Tobacco use Former smoker.  Family History . [name], MD; 01/11/2020 11:51 AM) Colon Cancer Father. Colon Polyps Father.  Other Problems . Kattie Santoyo, MD; 01/11/2020 11:51 AM) Atrial Fibrillation Diabetes Mellitus Hepatitis High blood pressure Hypercholesterolemia    Vitals Caldwell RMA; 01/11/2020 10:15 AM) 01/11/2020 10:15 AM Weight: 250.25 lb Height: 72in Body Surface Area: 2.34 m Body Mass Index: 33.94 kg/m  Temp.: 98.68F  Pulse: 64 (Regular)  P.OX: 93% (Room air) BP: 124/86(Sitting, Left Arm, Standard)        Physical Exam [name] K.  Merlinda Wrubel MD; 01/11/2020 11:57 AM)  The physical exam findings are as follows: Note:Constitutional: WDWN in NAD, conversant, no obvious deformities; resting comfortably Eyes: Pupils equal, round; sclera anicteric; moist conjunctiva; no lid lag HENT: Oral mucosa moist; good dentition Neck: No masses palpated, trachea midline; no thyromegaly Lungs: CTA bilaterally; normal respiratory effort Chest: healed median sternotomy incision CV: Regular rate and rhythm; no murmurs; extremities well-perfused with no edema Abd: +bowel sounds, soft, obese, healed midline incision; large ventral hernia from xiphoid to umbilicus - soft, reducible; cannot clearly palpate fascial edges Musc: Normal gait; no apparent clubbing or cyanosis in extremities Lymphatic: No palpable cervical or axillary lymphadenopathy Skin: Warm, dry; no sign of jaundice Psychiatric - alert and oriented x 4; calm mood and affect    Assessment & Plan [name] K. 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 The surgical procedure has been discussed with the patient. Potential risks, benefits, alternative treatments, and expected outcomes have been explained. All of the patient's questions at this time have been answered. The likelihood of reaching the patient's treatment goal is good. The patient understand the proposed surgical procedure and wishes to proceed.   Misty Stanley. 01/13/2020, MD, [facility] Surgery  General/ Trauma Surgery   01/11/2020 11:59 AM

## 2020-01-11 NOTE — Progress Notes (Addendum)
Cardiology Office Note:    Date:  01/13/2020   ID:  ASHTIAN VILLACIS, DOB April 21, 1943, MRN [id number]  PCP:  Karle Plumber, MD  Cardiologist:  Nanetta Batty, MD  Electrophysiologist:  None   Referring MD: Karle Plumber, MD   Chief Complaint: pre-op evaluation for ventral hernia repair  History of Present Illness:    [name] is a 74 y.o. male with a history of CAD, post-op atrial flutter previously on Amiodarone and Eliquis, hypertension, hyperlipidemia, diabetes, and gout who is followed by Dr. Allyson Sabal and presents today for pre-op evaluation for upcoming ventral hernia repair.   Patient referred to Dr. Allyson Sabal in 01/2018 for cardiac catheterization due to new onset exertional chest pain and abnormal Myoview. He underwent cardiac catheterization on 02/27/2018 which showed 90% stenosis of ostial to proximal LAD followed by another 90% stenosis of proximal LAD, 95% stenosis of ostial to proximal LCX, and 95% stenosis of ostial OM2. He was referred to CT Surgeon's for possible CABG. He ultimately underwent CABG x4 in 03/2018. His post-op course was complicated. He was in the ICU for 11 days and then transferred to the floor and ultimately to rehab. He was then readmitted to the hospital with a liver abscess requiring percutaneous drainage. He continued to have GI issus and had a laparoscopic cholecystectomy in 09/2018 which was a very difficult surgery. He developed an abscess had has pretty much had a percutaneous drain since that time. 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 Patient was readmitted in 03/2019 with recurrent liver abscess and duodenal abscess. He had underwent duodenal fistula repair on 04/06/2020 and then was readmitted on 04/29/2020 after outpatient CT showed recurrent abscess. IR performed repeat percutaneous drain and patient was started on IV antibiotics per ID.   Patient was last seen by Dr. Allyson Sabal in 05/2019 at which time he was doing well  from a cardiac standpoint. Given he was in normal sinus rhythm and atrial fibrillation occurred in post-op period following CABG, Amiodarone and Eliquis were stopped. 2-week Zio monitor was ordered and showed occasional PVC and occasional Mobitz 2nd degree AV block but no atrial fibrillation or flutter.  Patient presents today for pre-op evaluation for open ventral hernia repair. Patient doing very well from a cardiac standpoint. He has finished Cardiac Rehab and is staying very active. He goes to the gym 3 times per week and golfs 2 times per week. While at the gym, he spends 40 minutes doing cardio and then has several other exercises that he does. He denies any angina or shortness of breath with this. He is able to go up a flight of steps but states he has to be careful due to his peripheral neuropathy. No orthopnea, PND, edema, palpations, lightheadedness, dizziness, or syncope. He bruises easily but denies any abnormal bleeding in urine or stools. He has a large ventral hernia but denies any pain with this.   Past Medical History:  Diagnosis Date  . Allergies   . Anemia   . Anxiety   . CAD (coronary artery disease)   . Diabetes (HCC)   . Dyspnea    upon exertion  . Gout   . Hepatitis    at 74 years old  . Hyperlipemia   . Hypertension     Past Surgical History:  Procedure Laterality Date  . CHOLECYSTECTOMY N/A 10/07/2018   Procedure: Laparoscopic Cholecystectomy;  Surgeon: [name], MD;  Location: [facility] OR;  Service: General;  Laterality:  N/A;  . CORONARY ARTERY BYPASS GRAFT N/A 03/31/2018   Procedure: CORONARY ARTERY BYPASS GRAFTING (CABG) TIMES FOUR USING LEFT INTERNAL MAMMARY ARTERY AND RIGHT GREATER SAPHENOUS VEIN HARVESTED ENDOSCOPICALLY.;  Surgeon: Kerin Perna, MD;  Location: [facility] OR;  Service: Open Heart Surgery;  Laterality: N/A;  . DUODENAL FISTULA REPAIR  04/07/2019   REPAIR OF DUODENAL FISTULA (N/A Abdomen)  . IR CATHETER TUBE CHANGE  12/09/2018  . IR CATHETER TUBE  CHANGE  02/06/2019  . IR EXCHANGE BILIARY DRAIN  06/27/2018  . IR PERC CHOLECYSTOSTOMY  05/11/2018  . IR RADIOLOGIST EVAL & MGMT  06/11/2018  . IR RADIOLOGIST EVAL & MGMT  11/18/2018  . IR RADIOLOGIST EVAL & MGMT  12/03/2018  . IR RADIOLOGIST EVAL & MGMT  05/21/2019  . LEFT HEART CATH AND CORONARY ANGIOGRAPHY N/A 02/27/2018   Procedure: LEFT HEART CATH AND CORONARY ANGIOGRAPHY;  Surgeon: Runell Gess, MD;  Location: [facility] INVASIVE CV LAB;  Service: Cardiovascular;  Laterality: N/A;  . liver absess  2010  . TEE WITHOUT CARDIOVERSION N/A 03/31/2018   Procedure: TRANSESOPHAGEAL ECHOCARDIOGRAM (TEE);  Surgeon: Donata Clay, Theron Arista, MD;  Location: [facility] OR;  Service: Open Heart Surgery;  Laterality: N/A;    Current Medications: No outpatient medications have been marked as taking for the 01/13/20 encounter (Office Visit) with Corrin Parker, PA-C.     Allergies:   Ciprofloxacin and Erythromycin ethylsuccinate   Social History   Socioeconomic History  . Marital status: Widowed    Spouse name: Not on file  . Number of children: 2  . Years of education: Not on file  . Highest education level: Not on file  Occupational History  . Occupation: retired  Tobacco Use  . Smoking status: Former Smoker    Types: Cigarettes    Quit date: 1982    Years since quitting: 39.7  . Smokeless tobacco: Never Used  . Tobacco comment: quit 1982  Vaping Use  . Vaping Use: Never used  Substance and Sexual Activity  . Alcohol use: Not Currently  . Drug use: Never  . Sexual activity: Not on file  Other Topics Concern  . Not on file  Social History Narrative   Lives alone in [place]/ Retired Insurance underwriter.   Social Determinants of Health   Financial Resource Strain:   . Difficulty of Paying Living Expenses: Not on file  Food Insecurity:   . Worried About Programme researcher, broadcasting/film/video in the Last Year: Not on file  . Ran Out of Food in the Last Year: Not on file  Transportation Needs:   . Lack of  Transportation (Medical): Not on file  . Lack of Transportation (Non-Medical): Not on file  Physical Activity:   . Days of Exercise per Week: Not on file  . Minutes of Exercise per Session: Not on file  Stress:   . Feeling of Stress : Not on file  Social Connections:   . Frequency of Communication with Friends and Family: Not on file  . Frequency of Social Gatherings with Friends and Family: Not on file  . Attends Religious Services: Not on file  . Active Member of Clubs or Organizations: Not on file  . Attends Banker Meetings: Not on file  . Marital Status: Not on file     Family History: The patient's family history includes Atrial fibrillation in his mother; Cancer in his maternal aunt; Stroke in his mother.  ROS:   Please see the history of present illness.  EKGs/Labs/Other Studies Reviewed:    The following studies were reviewed today:  Left Heart Catheterization 02/27/2018:  Ost Cx to Prox Cx lesion is 95% stenosed.  Mid Cx lesion is 60% stenosed.  Ost 2nd Mrg lesion is 95% stenosed.  Ost LAD to Prox LAD lesion is 90% stenosed.  Prox LAD lesion is 90% stenosed.  Diagnostic Dominance: Left  _______________  [employer] Monitor 05/2019 to 06/2019: 1. SR/SB/ST 2. Occasional PVCs 3. Occasion Mobitz second degree AVB  EKG:  EKG ordered today. EKG personally reviewed and demonstrates sinus bradycardia, rate 55 bpm with RBBB and inferior Q waves. No acute changes compared to prior tracings.   Recent Labs: 04/08/2019: Magnesium 1.3 04/11/2019: ALT 19 05/03/2019: BUN 7; Creatinine, Ser 1.33; Hemoglobin 9.0; Platelets 419; Potassium 4.9; Sodium 132  Recent Lipid Panel No results found for: CHOL, TRIG, HDL, CHOLHDL, VLDL, LDLCALC, LDLDIRECT  Physical Exam:    Vital Signs: BP (!) 158/76   Pulse (!) 55   Ht 5\' 11"  (1.803 m)   Wt 249 lb 9.6 oz (113.2 kg)   SpO2 97%   BMI 34.81 kg/m     Wt Readings from Last 3 Encounters:  01/13/20 249 lb 9.6 oz (113.2  kg)  06/23/19 241 lb (109.3 kg)  05/26/19 234 lb (106.1 kg)     General: 74 y.o. male in no acute distress. HEENT: Normocephalic and atraumatic. Sclera clear.  Neck: Supple. No carotid bruits. No JVD. Heart: Mildly bradycardic with regular rhythm. Distinct S1 and S2. No murmurs, gallops, or rubs. Radial and distal pedal pulses 2+ and equal bilaterally. Lungs: No increased work of breathing. Clear to ausculation bilaterally. No wheezes, rhonchi, or rales.  Abdomen: Soft, non-distended, and non-tender to palpation. Large ventral hernia. Extremities: No to trace lower extremity edema bilaterally.    Skin: Warm and dry. Neuro: Alert and oriented x3. No focal deficits. Psych: Normal affect. Responds appropriately.   Assessment:    1. Pre-op evaluation   2. Coronary artery disease involving native coronary artery of native heart without angina pectoris   3. Post-op atrial flutter   4. Essential hypertension   5. Hyperlipidemia, unspecified hyperlipidemia type   6. Type 2 diabetes mellitus with complication, without long-term current use of insulin (HCC)     Plan:    Pre-Op Evaluation - Patient has upcoming open ventral hernia repair planned (not scheduled at this time but patient says it is planned for 01/2020). - He is doing well from a cardiac standpoint. No angina or CHF symptoms.  - Easily able to complete >4.0 METS without any angina symptoms. Per Revised Cardiac Risk Index, considered low to moderate risk. Therefore, based on ACC/AHA guidelines, he would be at acceptable risk for the planned procedure without further cardiovascular testing. OK to hold Aspirin as needed prior to surgery. This should be restarted as soon as it is felt safe in the post-op period. I will route this recommendation to the requesting party via Epic fax function.  CAD - History of CABG x4 in 01/2018.  - Stable No angina. - Continue aspirin and beta-blocker.  - Not currently on a statin. Patient states  he recently had lipid panel checked at PCP's office. Will request these records and then likely start statin.  Post-Op Atrial Flutter - Following CABG in 01/2018.  - Monitor in 05/2019 showed occasional PVC and occasional Mobitz 2nd degree AV block but no recurrent atrial flutter and no atrial fibrillation.   - Amiodarone and Eliquis discontinued at last office visit.  -  Maitaining sinus rhythm today. Rate 55 bpm.  Hypertension - BP elevated at 158/76 today but patient states his BP is usually well controlled. - Continue Lopressor 25mg  twice daily.  - Will have patient keep log of BP and heart for 3 weeks and then send us the results. If average BP above goal of 130/80, will likely add ARB given history of diabetes if renal function stable.  Hyperlipidemia - History of hyperlipidemia but not on statin.  - Patient states he recently had lipid panel checked at PCP's office. Will request these records and then likely start statin.  Type 2 Diabetes Mellitus - On Metformin at home. He would like to come off the Metformin because he states his hemoglobin A1c has been great the last several checks. - Management per PCP.  Disposition: Follow up in 6 months with Dr. Allyson SabalBerry.   Medication Adjustments/Labs and Tests Ordered: Current medicines are reviewed at length with the patient today.  Concerns regarding medicines are outlined above.  Orders Placed This Encounter  Procedures  . EKG 12-Lead   No orders of the defined types were placed in this encounter.   Patient Instructions  Medication Instructions:  Continue current medications  *If you need a refill on your cardiac medications before your next appointment, please call your pharmacy*   Lab Work: None Ordered  Testing/Procedures: None Ordered   Follow-Up: At [facility], you and your health needs are our priority.  As part of our continuing mission to provide you with exceptional heart care, we have created designated  Provider Care Teams.  These Care Teams include your primary Cardiologist (physician) and Advanced Practice Providers (APPs -  Physician Assistants and Nurse Practitioners) who all work together to provide you with the care you need, when you need it.  We recommend signing up for the patient portal called "MyChart".  Sign up information is provided on this After Visit Summary.  MyChart is used to connect with patients for Virtual Visits (Telemedicine).  Patients are able to view lab/test results, encounter notes, upcoming appointments, etc.  Non-urgent messages can be sent to your provider as well.   To learn more about what you can do with MyChart, go to [email or web address].    Your next appointment:   6 month(s)  The format for your next appointment:   In Person  Provider:   You may see Nanetta BattyJonathan Berry, MD or one of the following Advanced Practice Providers on your designated Care Team:    Corine ShelterLuke Kilroy, PA-C  Hettingerallie Isaih Bulger [place], [place]PA-C  Edd FabianJesse Cleaver, [place]NP    Other Instructions You are cleared for surgery Keep a daily blood pressure log for 3 weeks     Signed, Smitty KnudsenCallie E Amamda Curbow, PA-C  01/13/2020 10:56 AM    [facility]

## 2020-01-12 ENCOUNTER — Telehealth: Payer: Self-pay | Admitting: *Deleted

## 2020-01-12 NOTE — Telephone Encounter (Signed)
° °  [facility] Pre-operative Risk Assessment    PRIMARY CARDIOLOGIST - DR Quay Burow   Request for surgical clearance:  1. What type of surgery is being performed?  VENTRAL HERNIA REPAIR WITH MESH   2. When is this surgery scheduled? TBD  3. What type of clearance is required (medical clearance vs. Pharmacy clearance to hold med vs. Both)? BOTH   4. Are there any medications that need to be held prior to surgery and how long? ELIQUIS  5 MG   , ASPIRIN 81 MG   5. Practice name and name of physician performing surgery? [facility] ;DR MATTHEW TSUEI   6. What is the office phone number? [phone]   7.   What is the office fax number? [place] ,LPN   8.   Anesthesia type (None, local, MAC, general) ? GENERAL    [name] 01/12/2020, 12:31 PM  _________________________________________________________________   (provider comments below)

## 2020-01-12 NOTE — Telephone Encounter (Signed)
Patient is scheduled to see Marjie Skiff PA-C tomorrow for preoperative clearance.   Will defer to [facility] for final clearance. I will remove this from preop pool.

## 2020-01-13 ENCOUNTER — Encounter: Payer: Self-pay | Admitting: Student

## 2020-01-13 ENCOUNTER — Other Ambulatory Visit: Payer: Self-pay

## 2020-01-13 ENCOUNTER — Ambulatory Visit: Payer: Medicare HMO | Admitting: Student

## 2020-01-13 ENCOUNTER — Encounter

## 2020-01-13 VITALS — BP 158/76 | HR 55 | Ht 71.0 in | Wt 249.6 lb

## 2020-01-13 DIAGNOSIS — I4892 Unspecified atrial flutter: Secondary | ICD-10-CM | POA: Diagnosis not present

## 2020-01-13 DIAGNOSIS — E785 Hyperlipidemia, unspecified: Secondary | ICD-10-CM

## 2020-01-13 DIAGNOSIS — Z01818 Encounter for other preprocedural examination: Secondary | ICD-10-CM | POA: Diagnosis not present

## 2020-01-13 DIAGNOSIS — I10 Essential (primary) hypertension: Secondary | ICD-10-CM | POA: Diagnosis not present

## 2020-01-13 DIAGNOSIS — E118 Type 2 diabetes mellitus with unspecified complications: Secondary | ICD-10-CM

## 2020-01-13 DIAGNOSIS — I2510 Atherosclerotic heart disease of native coronary artery without angina pectoris: Secondary | ICD-10-CM | POA: Diagnosis not present

## 2020-01-13 NOTE — Telephone Encounter (Signed)
Eliquis was discontinued 05/20/19

## 2020-01-13 NOTE — Patient Instructions (Signed)
Medication Instructions:  Continue current medications  *If you need a refill on your cardiac medications before your next appointment, please call your pharmacy*   Lab Work: None Ordered  Testing/Procedures: None Ordered   Follow-Up: At [facility], you and your health needs are our priority.  As part of our continuing mission to provide you with exceptional heart care, we have created designated Provider Care Teams.  These Care Teams include your primary Cardiologist (physician) and Advanced Practice Providers (APPs -  Physician Assistants and Nurse Practitioners) who all work together to provide you with the care you need, when you need it.  We recommend signing up for the patient portal called "MyChart".  Sign up information is provided on this After Visit Summary.  MyChart is used to connect with patients for Virtual Visits (Telemedicine).  Patients are able to view lab/test results, encounter notes, upcoming appointments, etc.  Non-urgent messages can be sent to your provider as well.   To learn more about what you can do with MyChart, go to [email or web address].    Your next appointment:   6 month(s)  The format for your next appointment:   In Person  Provider:   You may see Nanetta Batty, MD or one of the following Advanced Practice Providers on your designated Care Team:    Corine Shelter, PA-C  [place], [place]  Edd Fabian, [place]    Other Instructions You are cleared for surgery Keep a daily blood pressure log for 3 weeks

## 2020-01-18 ENCOUNTER — Encounter

## 2020-02-18 ENCOUNTER — Encounter

## 2020-02-18 ENCOUNTER — Ambulatory Visit: Payer: Medicare HMO | Admitting: Cardiology

## 2020-03-01 NOTE — Pre-Procedure Instructions (Signed)
Your procedure is scheduled on Tuesday, November 9 from 11:00 AM- 1:00 PM.  Report to [facility] Main Entrance "A" at 09:00 A.M., and check in at the Admitting office.  Call this number if you have problems the morning of surgery:  [phone]  Call [phone] if you have any questions prior to your surgery date Monday-Friday 8am-4pm.    Remember:  Do not eat after midnight the night before your surgery.  You may drink clear liquids until 08:00 AM the morning of your surgery.   Clear liquids allowed are: Water, Non-Citrus Juices (without pulp), Carbonated Beverages, Clear Tea, Black Coffee Only, and Gatorade.    Take these medicines the morning of surgery with A SIP OF WATER:  amLODipine (NORVASC) cetirizine (ZYRTEC)  finasteride (PROSCAR) metoprolol tartrate (LOPRESSOR)    *Follow your surgeon's instructions on when to stop Aspirin.  If no instructions were given by your surgeon then you will need to call the office to get those instructions.     As of today, STOP taking any Aleve, Naproxen, Ibuprofen, Motrin, Advil, Goody's, BC's, all herbal medications, fish oil, and all vitamins.    WHAT DO I DO ABOUT MY DIABETES MEDICATION?     THE MORNING OF SURGERY: . Do not take metFORMIN (GLUCOPHAGE-XR)   HOW TO MANAGE YOUR DIABETES BEFORE AND AFTER SURGERY  Why is it important to control my blood sugar before and after surgery? . Improving blood sugar levels before and after surgery helps healing and can limit problems. . A way of improving blood sugar control is eating a healthy diet by: o  Eating less sugar and carbohydrates o  Increasing activity/exercise o  Talking with your doctor about reaching your blood sugar goals . High blood sugars (greater than 180 mg/dL) can raise your risk of infections and slow your recovery, so you will need to focus on controlling your diabetes during the weeks before surgery. . Make sure that the doctor who takes care of your diabetes  knows about your planned surgery including the date and location.  How do I manage my blood sugar before surgery? . Check your blood sugar at least 4 times a day, starting 2 days before surgery, to make sure that the level is not too high or low. . Check your blood sugar the morning of your surgery when you wake up and every 2 hours until you get to the Short Stay unit. o If your blood sugar is less than 70 mg/dL, you will need to treat for low blood sugar: - Do not take insulin. - Treat a low blood sugar (less than 70 mg/dL) with  cup of clear juice (cranberry or apple), 4 glucose tablets, OR glucose gel. - Recheck blood sugar in 15 minutes after treatment (to make sure it is greater than 70 mg/dL). If your blood sugar is not greater than 70 mg/dL on recheck, call [phone] for further instructions. . Report your blood sugar to the short stay nurse when you get to Short Stay.  . If you are admitted to the hospital after surgery: o Your blood sugar will be checked by the staff and you will probably be given insulin after surgery (instead of oral diabetes medicines) to make sure you have good blood sugar levels. o The goal for blood sugar control after surgery is 80-180 mg/dL.    The Morning of Surgery:            Do not wear jewelry.  Do not wear lotions, powders, colognes, or deodorant.            Men may shave face and neck.            Do not bring valuables to the hospital.            [facility] is not responsible for any belongings or valuables.  Do NOT Smoke (Tobacco/Vaping) or drink Alcohol 24 hours prior to your procedure.  If you use a CPAP at night, you may bring all equipment for your overnight stay.   Contacts, glasses, dentures or bridgework may not be worn into surgery.      For patients admitted to the hospital, discharge time will be determined by your treatment team.   Patients discharged the day of surgery will not be allowed to drive home, and someone  needs to stay with them for 24 hours.    Special instructions:   [facility]- Preparing For Surgery  Before surgery, you can play an important role. Because skin is not sterile, your skin needs to be as free of germs as possible. You can reduce the number of germs on your skin by washing with CHG (chlorahexidine gluconate) Soap before surgery.  CHG is an antiseptic cleaner which kills germs and bonds with the skin to continue killing germs even after washing.    Oral Hygiene is also important to reduce your risk of infection.  Remember - BRUSH YOUR TEETH THE MORNING OF SURGERY WITH YOUR REGULAR TOOTHPASTE  Please do not use if you have an allergy to CHG or antibacterial soaps. If your skin becomes reddened/irritated stop using the CHG.  Do not shave (including legs and underarms) for at least 48 hours prior to first CHG shower. It is OK to shave your face.  Please follow these instructions carefully.   1. Shower the NIGHT BEFORE SURGERY and the MORNING OF SURGERY with CHG Soap.   2. If you chose to wash your hair, wash your hair first as usual with your normal shampoo.  3. After you shampoo, rinse your hair and body thoroughly to remove the shampoo.  4. Use CHG as you would any other liquid soap. You can apply CHG directly to the skin and wash gently with a scrungie or a clean washcloth.   5. Apply the CHG Soap to your body ONLY FROM THE NECK DOWN.  Do not use on open wounds or open sores. Avoid contact with your eyes, ears, mouth and genitals (private parts). Wash Face and genitals (private parts)  with your normal soap.   6. Wash thoroughly, paying special attention to the area where your surgery will be performed.  7. Thoroughly rinse your body with warm water from the neck down.  8. DO NOT shower/wash with your normal soap after using and rinsing off the CHG Soap.  9. Pat yourself dry with a CLEAN TOWEL.  10. Wear CLEAN PAJAMAS to bed the night before surgery  11. Place CLEAN  SHEETS on your bed the night of your first shower and DO NOT SLEEP WITH PETS.   Day of Surgery: SHOWER Wear Clean/Comfortable clothing the morning of surgery Do not apply any deodorants/lotions.   Remember to brush your teeth WITH YOUR REGULAR TOOTHPASTE.   Please read over the following fact sheets that you were given.

## 2020-03-02 ENCOUNTER — Encounter

## 2020-03-02 ENCOUNTER — Encounter (HOSPITAL_COMMUNITY)
Admission: RE | Admit: 2020-03-02 | Discharge: 2020-03-02 | Disposition: A | Discharge status: Home / Self Care | Payer: Medicare HMO | Source: Ambulatory Visit | Attending: Surgery | Admitting: Surgery

## 2020-03-02 ENCOUNTER — Encounter (HOSPITAL_COMMUNITY): Payer: Self-pay

## 2020-03-02 ENCOUNTER — Other Ambulatory Visit: Payer: Self-pay

## 2020-03-02 DIAGNOSIS — Z951 Presence of aortocoronary bypass graft: Secondary | ICD-10-CM | POA: Insufficient documentation

## 2020-03-02 DIAGNOSIS — N289 Disorder of kidney and ureter, unspecified: Secondary | ICD-10-CM | POA: Insufficient documentation

## 2020-03-02 DIAGNOSIS — E119 Type 2 diabetes mellitus without complications: Secondary | ICD-10-CM | POA: Insufficient documentation

## 2020-03-02 DIAGNOSIS — Z7901 Long term (current) use of anticoagulants: Secondary | ICD-10-CM | POA: Insufficient documentation

## 2020-03-02 DIAGNOSIS — I2510 Atherosclerotic heart disease of native coronary artery without angina pectoris: Secondary | ICD-10-CM | POA: Insufficient documentation

## 2020-03-02 DIAGNOSIS — Z01812 Encounter for preprocedural laboratory examination: Secondary | ICD-10-CM | POA: Insufficient documentation

## 2020-03-02 DIAGNOSIS — G4733 Obstructive sleep apnea (adult) (pediatric): Secondary | ICD-10-CM | POA: Insufficient documentation

## 2020-03-02 DIAGNOSIS — I441 Atrioventricular block, second degree: Secondary | ICD-10-CM | POA: Insufficient documentation

## 2020-03-02 DIAGNOSIS — I493 Ventricular premature depolarization: Secondary | ICD-10-CM | POA: Insufficient documentation

## 2020-03-02 DIAGNOSIS — I10 Essential (primary) hypertension: Secondary | ICD-10-CM | POA: Diagnosis not present

## 2020-03-02 DIAGNOSIS — Z9989 Dependence on other enabling machines and devices: Secondary | ICD-10-CM | POA: Insufficient documentation

## 2020-03-02 DIAGNOSIS — E785 Hyperlipidemia, unspecified: Secondary | ICD-10-CM | POA: Diagnosis not present

## 2020-03-02 HISTORY — DX: Headache, unspecified: R51.9

## 2020-03-02 HISTORY — DX: Sleep apnea, unspecified: G47.30

## 2020-03-02 HISTORY — DX: Incisional hernia without obstruction or gangrene: K43.2

## 2020-03-02 HISTORY — DX: Polyneuropathy, unspecified: G62.9

## 2020-03-02 LAB — COMPREHENSIVE METABOLIC PANEL
ALT: 24 U/L (ref 0–44)
AST: 27 U/L (ref 15–41)
Albumin: 3.8 g/dL (ref 3.5–5.0)
Alkaline Phosphatase: 65 U/L (ref 38–126)
Anion gap: 13 (ref 5–15)
BUN: 25 mg/dL — ABNORMAL HIGH (ref 8–23)
CO2: 22 mmol/L (ref 22–32)
Calcium: 9.4 mg/dL (ref 8.9–10.3)
Chloride: 102 mmol/L (ref 98–111)
Creatinine, Ser: 1.53 mg/dL — ABNORMAL HIGH (ref 0.61–1.24)
GFR, Estimated: 47 mL/min — ABNORMAL LOW (ref >60)
Glucose, Bld: 162 mg/dL — ABNORMAL HIGH (ref 70–99)
Potassium: 3.9 mmol/L (ref 3.5–5.1)
Sodium: 137 mmol/L (ref 135–145)
Total Bilirubin: 0.8 mg/dL (ref 0.3–1.2)
Total Protein: 6.6 g/dL (ref 6.5–8.1)

## 2020-03-02 LAB — CBC
HCT: 38.8 % — ABNORMAL LOW (ref 39.0–52.0)
Hemoglobin: 12.8 g/dL — ABNORMAL LOW (ref 13.0–17.0)
MCH: 31 pg (ref 26.0–34.0)
MCHC: 33 g/dL (ref 30.0–36.0)
MCV: 93.9 fL (ref 80.0–100.0)
Platelets: 216 K/uL (ref 150–400)
RBC: 4.13 MIL/uL — ABNORMAL LOW (ref 4.22–5.81)
RDW: 13.2 % (ref 11.5–15.5)
WBC: 9.2 K/uL (ref 4.0–10.5)
nRBC: 0 % (ref 0.0–0.2)

## 2020-03-02 LAB — HEMOGLOBIN A1C
Hgb A1c MFr Bld: 6.5 % — ABNORMAL HIGH (ref 4.8–5.6)
Mean Plasma Glucose: 139.85 mg/dL

## 2020-03-02 LAB — GLUCOSE, CAPILLARY: Glucose-Capillary: 192 mg/dL — ABNORMAL HIGH (ref 70–99)

## 2020-03-02 NOTE — Progress Notes (Signed)
PCP - Dr. Barney Drain Cardiologist - Dr. Nanetta Batty  PPM/ICD - denies  Chest x-ray - N/A EKG - 01/13/2020 Stress Test - 02/13/18 ECHO - 02/21/18 Cardiac Cath - 02/27/18  Sleep Study - Yes, per patient had a recent one done, "wears CPAP just about every night"  Fasting Blood Sugar - 100 - 120 Checks Blood Sugar 1 time/day  Blood Thinner Instructions: N/A Aspirin Instructions: Patient instructed to call surgeon's office for instructions on when to stop taking Aspirin for surgery. Surgeon's office number given  ERAS Protcol - YES PRE-SURGERY Ensure or G2- 10 oz bottle of water given, G2 not available  COVID TEST- Scheduled for 03/04/2020. Patient verbalized understanding of self-quarantine instructions, appointment time and place.  Anesthesia review: YES, cardiac hx, cardiac clearance  Patient denies shortness of breath, fever, cough and chest pain at PAT appointment  All instructions explained to the patient, with a verbal understanding of the material. Patient agrees to go over the instructions while at home for a better understanding. Patient also instructed to self quarantine after being tested for COVID-19. The opportunity to ask questions was provided.

## 2020-03-03 ENCOUNTER — Encounter

## 2020-03-03 NOTE — Progress Notes (Signed)
Anesthesia Chart Review:  Follows with cardiology for hx of history of CAD s/p CABG x 4 03/2018, post-op atrial flutter previously on Amiodarone and Eliquis, hypertension, hyperlipidemia. He was seen by Dr. Allyson Sabal in 05/2019 at which time he was doing well from a cardiac standpoint. Given he was in normal sinus rhythm and atrial fibrillation occurred in post-op period following CABG, Amiodarone and Eliquis were stopped. 2-week Zio monitor was ordered and showed occasional PVC and occasional Mobitz 2nd degree AV block but no atrial fibrillation or flutter. He was last seen by cardiology 01/13/20 for preop evaluation. Per note, "- Patient has upcoming open ventral hernia repair planned (not scheduled at this time but patient says it is planned for 01/2020). - He is doing well from a cardiac standpoint. No angina or CHF symptoms.         - Easily able to complete >4.0 METS without any angina symptoms. Per Revised Cardiac Risk Index, considered low to moderate risk. Therefore, based on ACC/AHA guidelines, he would be at acceptable risk for the planned procedure without further cardiovascular testing. OK to hold Aspirin as needed prior to surgery. This should be restarted as soon as it is felt safe in the post-op period. I will route this recommendation to the requesting party via Epic fax function."  DMII well controlled, A1c 6.5 on preop labs.   OSA on CPAP.  Renal insufficiency.  Preop labs reviewed, creatinine mildly elevated 1.53, review of records shows baseline to be about 1.4.  Remainder of labs unremarkable.  Event monitor 06/16/2019: 1. SR/SB/ST 2. Occasional PVCs 3. Occasion Mobitz second degree AVB  TTE 02/21/2018 (pre-CABG): Conclusions: 1.  There is mild concentric left ventricular hypertrophy. 2.  Overall left ventricular systolic function is normal with an EF between 55 to 60%. 3.  Left atrium is moderately dilated. 4.  There is mild aortic valve sclerosis without stenosis. 5.  Other  than LAE, normal cardiac chamber sizes and function; normal valve function; no pericardial effusion or intracardiac mass.  No intracardiac shunts.  Normal thoracic aorta and aortic arch.  Zannie Cove [facility] Short Stay Center/Anesthesiology Phone [phone] 03/03/2020 11:18 AM

## 2020-03-03 NOTE — Anesthesia Preprocedure Evaluation (Addendum)
Anesthesia Evaluation  Patient identified by MRN, date of birth, ID band Patient awake    Reviewed: Allergy & Precautions, H&P , NPO status , Patient's Chart, lab work & pertinent test results  Airway Mallampati: II   Neck ROM: full    Dental   Pulmonary shortness of breath, sleep apnea , former smoker,    breath sounds clear to auscultation       Cardiovascular hypertension, + CAD and + CABG   Rhythm:regular Rate:Normal     Neuro/Psych  Headaches, PSYCHIATRIC DISORDERS Anxiety  Neuromuscular disease    GI/Hepatic   Endo/Other  diabetesobese  Renal/GU Renal InsufficiencyRenal disease     Musculoskeletal   Abdominal   Peds  Hematology   Anesthesia Other Findings   Reproductive/Obstetrics                            Anesthesia Physical Anesthesia Plan  ASA: III  Anesthesia Plan: General   Post-op Pain Management:    Induction: Intravenous  PONV Risk Score and Plan: 2 and Ondansetron, Dexamethasone and Treatment may vary due to age or medical condition  Airway Management Planned: Oral ETT  Additional Equipment:   Intra-op Plan:   Post-operative Plan: Extubation in OR  Informed Consent: I have reviewed the patients History and Physical, chart, labs and discussed the procedure including the risks, benefits and alternatives for the proposed anesthesia with the patient or authorized representative who has indicated his/her understanding and acceptance.       Plan Discussed with: CRNA, Anesthesiologist and Surgeon  Anesthesia Plan Comments: (PAT note by Antionette Poles, PA-C:Follows with cardiology for hx of history of CAD s/p CABG x 4 03/2018, post-op atrial flutter previously on Amiodarone and Eliquis, hypertension, hyperlipidemia. He was seen by Dr. Allyson Sabal in 05/2019 at which time he was doing well from a cardiac standpoint. Given he was in normal sinus rhythm and atrial fibrillation  occurred in post-op period following CABG, Amiodarone and Eliquis were stopped. 2-week Zio monitor was ordered and showed occasional PVC and occasional Mobitz 2nd degree AV block but no atrial fibrillation or flutter. He was last seen by cardiology 01/13/20 for preop evaluation. Per note, "- Patient has upcoming open ventral hernia repair planned (not scheduled at this time but patient says it is planned for 01/2020). - He is doing well from a cardiac standpoint. No angina or CHF symptoms.         - Easily able to complete >4.0 METS without any angina symptoms. Per Revised Cardiac Risk Index, considered low to moderate risk. Therefore, based on ACC/AHA guidelines, he would be at acceptable risk for the planned procedure without further cardiovascular testing. OK to hold Aspirin as needed prior to surgery. This should be restarted as soon as it is felt safe in the post-op period. I will route this recommendation to the requesting party via Epic fax function."  DMII well controlled, A1c 6.5 on preop labs.   OSA on CPAP.  Renal insufficiency.  Preop labs reviewed, creatinine mildly elevated 1.53, review of records shows baseline to be about 1.4.  Remainder of labs unremarkable.  Event monitor 06/16/2019: 1. SR/SB/ST 2. Occasional PVCs 3. Occasion Mobitz second degree AVB  TTE 02/21/2018 (pre-CABG): Conclusions: 1.  There is mild concentric left ventricular hypertrophy. 2.  Overall left ventricular systolic function is normal with an EF between 55 to 60%. 3.  Left atrium is moderately dilated. 4.  There is mild aortic valve sclerosis  without stenosis. 5.  Other than LAE, normal cardiac chamber sizes and function; normal valve function; no pericardial effusion or intracardiac mass.  No intracardiac shunts.  Normal thoracic aorta and aortic arch. )       Anesthesia Quick Evaluation

## 2020-03-04 ENCOUNTER — Other Ambulatory Visit (HOSPITAL_COMMUNITY)
Admission: RE | Admit: 2020-03-04 | Discharge: 2020-03-04 | Disposition: A | Discharge status: Home / Self Care | Payer: Medicare HMO | Source: Ambulatory Visit | Attending: Surgery | Admitting: Surgery

## 2020-03-04 ENCOUNTER — Encounter

## 2020-03-04 DIAGNOSIS — Z01812 Encounter for preprocedural laboratory examination: Secondary | ICD-10-CM | POA: Insufficient documentation

## 2020-03-04 DIAGNOSIS — Z20822 Contact with and (suspected) exposure to covid-19: Secondary | ICD-10-CM | POA: Insufficient documentation

## 2020-03-04 LAB — SARS CORONAVIRUS 2 (TAT 6-24 HRS): SARS Coronavirus 2: NEGATIVE

## 2020-03-08 ENCOUNTER — Ambulatory Visit (HOSPITAL_COMMUNITY): Payer: Medicare HMO | Admitting: Physician Assistant

## 2020-03-08 ENCOUNTER — Other Ambulatory Visit: Payer: Self-pay

## 2020-03-08 ENCOUNTER — Observation Stay (HOSPITAL_COMMUNITY)
Admission: RE | Admit: 2020-03-08 | Discharge: 2020-03-10 | Disposition: A | Discharge status: Home / Self Care | Payer: Medicare HMO | Attending: Surgery | Admitting: Surgery

## 2020-03-08 ENCOUNTER — Encounter (HOSPITAL_COMMUNITY)
Admission: RE | Disposition: A | Discharge status: Home / Self Care | Payer: Self-pay | Source: Home / Self Care | Attending: Surgery

## 2020-03-08 ENCOUNTER — Encounter (HOSPITAL_COMMUNITY): Payer: Self-pay | Admitting: Surgery

## 2020-03-08 ENCOUNTER — Encounter

## 2020-03-08 ENCOUNTER — Ambulatory Visit (HOSPITAL_COMMUNITY): Payer: Medicare HMO | Admitting: Certified Registered Nurse Anesthetist

## 2020-03-08 DIAGNOSIS — E119 Type 2 diabetes mellitus without complications: Secondary | ICD-10-CM | POA: Insufficient documentation

## 2020-03-08 DIAGNOSIS — Z7984 Long term (current) use of oral hypoglycemic drugs: Secondary | ICD-10-CM | POA: Insufficient documentation

## 2020-03-08 DIAGNOSIS — Z951 Presence of aortocoronary bypass graft: Secondary | ICD-10-CM | POA: Insufficient documentation

## 2020-03-08 DIAGNOSIS — K432 Incisional hernia without obstruction or gangrene: Secondary | ICD-10-CM | POA: Diagnosis present

## 2020-03-08 DIAGNOSIS — Z87891 Personal history of nicotine dependence: Secondary | ICD-10-CM | POA: Insufficient documentation

## 2020-03-08 DIAGNOSIS — Z79899 Other long term (current) drug therapy: Secondary | ICD-10-CM | POA: Diagnosis not present

## 2020-03-08 DIAGNOSIS — I10 Essential (primary) hypertension: Secondary | ICD-10-CM | POA: Insufficient documentation

## 2020-03-08 DIAGNOSIS — Z7901 Long term (current) use of anticoagulants: Secondary | ICD-10-CM | POA: Insufficient documentation

## 2020-03-08 HISTORY — PX: INSERTION OF MESH: SHX5868

## 2020-03-08 HISTORY — PX: INCISIONAL HERNIA REPAIR: SHX193

## 2020-03-08 HISTORY — PX: VENTRAL HERNIA REPAIR: SHX424

## 2020-03-08 LAB — GLUCOSE, CAPILLARY
Glucose-Capillary: 134 mg/dL — ABNORMAL HIGH (ref 70–99)
Glucose-Capillary: 194 mg/dL — ABNORMAL HIGH (ref 70–99)
Glucose-Capillary: 157 mg/dL — ABNORMAL HIGH (ref 70–99)
Glucose-Capillary: 170 mg/dL — ABNORMAL HIGH (ref 70–99)

## 2020-03-08 SURGERY — HERNIA REPAIR VENTRAL ADULT
Anesthesia: General | Site: Abdomen

## 2020-03-08 MED ORDER — OXYCODONE HCL 5 MG PO TABS
5.0000 mg | ORAL | Status: DC | PRN
Start: 2020-03-08 — End: 2020-03-10
  Administered 2020-03-08 – 2020-03-09 (×3): 10 mg via ORAL
  Administered 2020-03-10: 5 mg via ORAL
  Administered 2020-03-10: 10 mg via ORAL
  Filled 2020-03-08: qty 2
  Filled 2020-03-08: qty 1
  Filled 2020-03-08 (×4): qty 2

## 2020-03-08 MED ORDER — DOCUSATE SODIUM 100 MG PO CAPS
100.0000 mg | Freq: Two times a day (BID) | ORAL | Status: DC
Start: 2020-03-08 — End: 2020-03-10
  Administered 2020-03-08 – 2020-03-10 (×4): 100 mg via ORAL
  Filled 2020-03-08 (×6): qty 1

## 2020-03-08 MED ORDER — ONDANSETRON HCL 4 MG/2ML IJ SOLN
4.0000 mg | Freq: Four times a day (QID) | INTRAMUSCULAR | Status: DC | PRN
Start: 2020-03-08 — End: 2020-03-08

## 2020-03-08 MED ORDER — FENTANYL CITRATE (PF) 250 MCG/5ML IJ SOLN
INTRAMUSCULAR | Status: AC
Start: 2020-03-08 — End: ?
  Filled 2020-03-08: qty 5

## 2020-03-08 MED ORDER — PROPOFOL 10 MG/ML IV BOLUS
INTRAVENOUS | Status: DC | PRN
Start: 2020-03-08 — End: 2020-03-08
  Administered 2020-03-08: 200 mg via INTRAVENOUS

## 2020-03-08 MED ORDER — ENOXAPARIN SODIUM 40 MG/0.4ML ~~LOC~~ SOLN
40.0000 mg | SUBCUTANEOUS | Status: DC
Start: 2020-03-09 — End: 2020-03-10
  Administered 2020-03-09 – 2020-03-10 (×2): 40 mg via SUBCUTANEOUS
  Filled 2020-03-08 (×2): qty 0.4

## 2020-03-08 MED ORDER — FENTANYL CITRATE (PF) 100 MCG/2ML IJ SOLN
INTRAMUSCULAR | Status: AC
Start: 2020-03-08 — End: 2020-03-08
  Administered 2020-03-08: 50 ug via INTRAVENOUS
  Filled 2020-03-08: qty 2

## 2020-03-08 MED ORDER — METHOCARBAMOL 500 MG PO TABS
500.0000 mg | Freq: Four times a day (QID) | ORAL | Status: DC | PRN
Start: 2020-03-08 — End: 2020-03-10
  Administered 2020-03-08 – 2020-03-10 (×5): 500 mg via ORAL
  Filled 2020-03-08 (×5): qty 1

## 2020-03-08 MED ORDER — ONDANSETRON HCL 4 MG/2ML IJ SOLN
4.0000 mg | Freq: Four times a day (QID) | INTRAMUSCULAR | Status: DC | PRN
Start: 2020-03-08 — End: 2020-03-10

## 2020-03-08 MED ORDER — ACETAMINOPHEN 500 MG PO TABS
1000.0000 mg | ORAL | Status: AC
Start: 2020-03-08 — End: 2020-03-08
  Administered 2020-03-08: 1000 mg via ORAL
  Filled 2020-03-08: qty 2

## 2020-03-08 MED ORDER — CHLORHEXIDINE GLUCONATE CLOTH 2 % EX PADS
6.0000 | Freq: Once | CUTANEOUS | Status: DC
Start: 2020-03-08 — End: 2020-03-08

## 2020-03-08 MED ORDER — FINASTERIDE 5 MG PO TABS
5.0000 mg | Freq: Every day | ORAL | Status: DC
Start: 2020-03-09 — End: 2020-03-10
  Administered 2020-03-09 – 2020-03-10 (×2): 5 mg via ORAL
  Filled 2020-03-08 (×2): qty 1

## 2020-03-08 MED ORDER — OXYCODONE HCL 5 MG PO TABS
ORAL | Status: AC
Start: 2020-03-08 — End: 2020-03-08
  Administered 2020-03-08: 5 mg via ORAL
  Filled 2020-03-08: qty 1

## 2020-03-08 MED ORDER — GABAPENTIN 300 MG PO CAPS
300.0000 mg | Freq: Two times a day (BID) | ORAL | Status: DC
Start: 2020-03-08 — End: 2020-03-10
  Administered 2020-03-08 – 2020-03-10 (×4): 300 mg via ORAL
  Filled 2020-03-08 (×5): qty 1

## 2020-03-08 MED ORDER — POLYETHYLENE GLYCOL 3350 17 G PO PACK
17.0000 g | Freq: Every day | ORAL | Status: DC | PRN
Start: 2020-03-08 — End: 2020-03-10

## 2020-03-08 MED ORDER — METOPROLOL TARTRATE 50 MG PO TABS
50.0000 mg | Freq: Two times a day (BID) | ORAL | Status: DC
Start: 2020-03-08 — End: 2020-03-10
  Administered 2020-03-08 – 2020-03-10 (×4): 50 mg via ORAL
  Filled 2020-03-08 (×4): qty 1

## 2020-03-08 MED ORDER — DEXAMETHASONE SODIUM PHOSPHATE 10 MG/ML IJ SOLN
INTRAMUSCULAR | Status: DC | PRN
Start: 2020-03-08 — End: 2020-03-08
  Administered 2020-03-08: 5 mg via INTRAVENOUS

## 2020-03-08 MED ORDER — ONDANSETRON HCL 4 MG/2ML IJ SOLN
INTRAMUSCULAR | Status: AC
Start: 2020-03-08 — End: ?
  Filled 2020-03-08: qty 2

## 2020-03-08 MED ORDER — DIPHENHYDRAMINE HCL 12.5 MG/5ML PO ELIX
12.5000 mg | Freq: Four times a day (QID) | ORAL | Status: DC | PRN
Start: 2020-03-08 — End: 2020-03-10

## 2020-03-08 MED ORDER — KETOROLAC TROMETHAMINE 15 MG/ML IJ SOLN
15.0000 mg | Freq: Four times a day (QID) | INTRAMUSCULAR | Status: DC
Start: 2020-03-08 — End: 2020-03-09
  Administered 2020-03-08 – 2020-03-09 (×4): 15 mg via INTRAVENOUS
  Filled 2020-03-08 (×3): qty 1

## 2020-03-08 MED ORDER — ROCURONIUM BROMIDE 10 MG/ML (PF) SYRINGE
INTRAVENOUS | Status: AC
Start: 2020-03-08 — End: ?
  Filled 2020-03-08: qty 10

## 2020-03-08 MED ORDER — FENTANYL CITRATE (PF) 100 MCG/2ML IJ SOLN
25.0000 ug | INTRAMUSCULAR | Status: DC | PRN
Start: 2020-03-08 — End: 2020-03-08
  Administered 2020-03-08: 50 ug via INTRAVENOUS

## 2020-03-08 MED ORDER — AMLODIPINE BESYLATE 10 MG PO TABS
10.0000 mg | Freq: Every day | ORAL | Status: DC
Start: 2020-03-09 — End: 2020-03-10
  Administered 2020-03-09 – 2020-03-10 (×2): 10 mg via ORAL
  Filled 2020-03-08 (×2): qty 1

## 2020-03-08 MED ORDER — ACETAMINOPHEN 650 MG RE SUPP
650.0000 mg | Freq: Four times a day (QID) | RECTAL | Status: DC | PRN
Start: 2020-03-08 — End: 2020-03-10

## 2020-03-08 MED ORDER — LIDOCAINE 2% (20 MG/ML) 5 ML SYRINGE
INTRAMUSCULAR | Status: AC
Start: 2020-03-08 — End: ?
  Filled 2020-03-08: qty 5

## 2020-03-08 MED ORDER — MORPHINE SULFATE (PF) 2 MG/ML IV SOLN
2.0000 mg | INTRAVENOUS | Status: DC | PRN
Start: 2020-03-08 — End: 2020-03-10
  Administered 2020-03-08: 4 mg via INTRAVENOUS
  Administered 2020-03-08: 2 mg via INTRAVENOUS
  Filled 2020-03-08: qty 1
  Filled 2020-03-08: qty 2

## 2020-03-08 MED ORDER — OXYCODONE HCL 5 MG PO TABS
5.0000 mg | Freq: Once | ORAL | Status: AC | PRN
Start: 2020-03-08 — End: 2020-03-08

## 2020-03-08 MED ORDER — SUGAMMADEX SODIUM 200 MG/2ML IV SOLN
INTRAVENOUS | Status: DC | PRN
Start: 2020-03-08 — End: 2020-03-08
  Administered 2020-03-08: 200 mg via INTRAVENOUS

## 2020-03-08 MED ORDER — POTASSIUM CHLORIDE IN NACL 20-0.9 MEQ/L-% IV SOLN
INTRAVENOUS | Status: DC
Start: 2020-03-08 — End: 2020-03-09
  Filled 2020-03-08 (×2): qty 1000

## 2020-03-08 MED ORDER — GABAPENTIN 300 MG PO CAPS
300.0000 mg | ORAL | Status: AC
Start: 2020-03-08 — End: 2020-03-08
  Administered 2020-03-08: 300 mg via ORAL
  Filled 2020-03-08: qty 1

## 2020-03-08 MED ORDER — FENTANYL CITRATE (PF) 250 MCG/5ML IJ SOLN
INTRAMUSCULAR | Status: DC | PRN
Start: 2020-03-08 — End: 2020-03-08
  Administered 2020-03-08: 100 ug via INTRAVENOUS
  Administered 2020-03-08: 50 ug via INTRAVENOUS
  Administered 2020-03-08: 25 ug via INTRAVENOUS
  Administered 2020-03-08: 50 ug via INTRAVENOUS
  Administered 2020-03-08: 25 ug via INTRAVENOUS

## 2020-03-08 MED ORDER — ONDANSETRON HCL 4 MG/2ML IJ SOLN
INTRAMUSCULAR | Status: DC | PRN
Start: 2020-03-08 — End: 2020-03-08
  Administered 2020-03-08: 4 mg via INTRAVENOUS

## 2020-03-08 MED ORDER — INSULIN ASPART 100 UNIT/ML ~~LOC~~ SOLN
0.0000 [IU] | Freq: Three times a day (TID) | SUBCUTANEOUS | Status: DC
Start: 2020-03-08 — End: 2020-03-10
  Administered 2020-03-09 – 2020-03-10 (×2): 2 [IU] via SUBCUTANEOUS

## 2020-03-08 MED ORDER — ROCURONIUM BROMIDE 10 MG/ML (PF) SYRINGE
INTRAVENOUS | Status: DC | PRN
Start: 2020-03-08 — End: 2020-03-08
  Administered 2020-03-08: 30 mg via INTRAVENOUS
  Administered 2020-03-08: 100 mg via INTRAVENOUS

## 2020-03-08 MED ORDER — METFORMIN HCL ER 500 MG PO TB24
500.0000 mg | Freq: Two times a day (BID) | ORAL | Status: DC
Start: 2020-03-08 — End: 2020-03-10
  Administered 2020-03-08 – 2020-03-10 (×3): 500 mg via ORAL
  Filled 2020-03-08 (×7): qty 1

## 2020-03-08 MED ORDER — LIDOCAINE 2% (20 MG/ML) 5 ML SYRINGE
INTRAMUSCULAR | Status: DC | PRN
Start: 2020-03-08 — End: 2020-03-08
  Administered 2020-03-08: 100 mg via INTRAVENOUS

## 2020-03-08 MED ORDER — ONDANSETRON 4 MG PO TBDP
4.0000 mg | Freq: Four times a day (QID) | ORAL | Status: DC | PRN
Start: 2020-03-08 — End: 2020-03-10

## 2020-03-08 MED ORDER — LACTATED RINGERS IV SOLN
INTRAVENOUS | Status: DC
Start: 2020-03-08 — End: 2020-03-08

## 2020-03-08 MED ORDER — CHLORHEXIDINE GLUCONATE 0.12 % MT SOLN
15.0000 mL | Freq: Once | OROMUCOSAL | Status: AC
Start: 2020-03-08 — End: 2020-03-08
  Administered 2020-03-08: 15 mL via OROMUCOSAL

## 2020-03-08 MED ORDER — ACETAMINOPHEN 325 MG PO TABS
650.0000 mg | Freq: Four times a day (QID) | ORAL | Status: DC | PRN
Start: 2020-03-08 — End: 2020-03-10

## 2020-03-08 MED ORDER — DEXAMETHASONE SODIUM PHOSPHATE 10 MG/ML IJ SOLN
INTRAMUSCULAR | Status: AC
Start: 2020-03-08 — End: ?
  Filled 2020-03-08: qty 1

## 2020-03-08 MED ORDER — ORAL CARE MOUTH RINSE
15.0000 mL | Freq: Once | OROMUCOSAL | Status: AC
Start: 2020-03-08 — End: 2020-03-08

## 2020-03-08 MED ORDER — LORATADINE 10 MG PO TABS
10.0000 mg | Freq: Every day | ORAL | Status: DC
Start: 2020-03-09 — End: 2020-03-10
  Administered 2020-03-09 – 2020-03-10 (×2): 10 mg via ORAL
  Filled 2020-03-08 (×2): qty 1

## 2020-03-08 MED ORDER — DIPHENHYDRAMINE HCL 50 MG/ML IJ SOLN
12.5000 mg | Freq: Four times a day (QID) | INTRAMUSCULAR | Status: DC | PRN
Start: 2020-03-08 — End: 2020-03-10

## 2020-03-08 MED ORDER — PROPOFOL 10 MG/ML IV BOLUS
INTRAVENOUS | Status: AC
Start: 2020-03-08 — End: ?
  Filled 2020-03-08: qty 20

## 2020-03-08 MED ORDER — 0.9 % SODIUM CHLORIDE (POUR BTL) OPTIME
TOPICAL | Status: DC | PRN
Start: 2020-03-08 — End: 2020-03-08
  Administered 2020-03-08 (×2): 1000 mL

## 2020-03-08 MED ORDER — EPHEDRINE SULFATE-NACL 50-0.9 MG/10ML-% IV SOSY
INTRAVENOUS | Status: DC | PRN
Start: 2020-03-08 — End: 2020-03-08
  Administered 2020-03-08 (×2): 10 mg via INTRAVENOUS

## 2020-03-08 MED ORDER — BUPIVACAINE HCL (PF) 0.25 % IJ SOLN
INTRAMUSCULAR | Status: AC
Start: 2020-03-08 — End: ?
  Filled 2020-03-08: qty 30

## 2020-03-08 MED ORDER — BUPIVACAINE HCL (PF) 0.25 % IJ SOLN
INTRAMUSCULAR | Status: DC | PRN
Start: 2020-03-08 — End: 2020-03-08

## 2020-03-08 MED ORDER — CEFAZOLIN SODIUM-DEXTROSE 2-4 GM/100ML-% IV SOLN
2.0000 g | INTRAVENOUS | Status: AC
Start: 2020-03-08 — End: 2020-03-08
  Administered 2020-03-08: 2 g via INTRAVENOUS
  Filled 2020-03-08: qty 100

## 2020-03-08 MED ORDER — OXYCODONE HCL 5 MG/5ML PO SOLN
5.0000 mg | Freq: Once | ORAL | Status: AC | PRN
Start: 2020-03-08 — End: 2020-03-08

## 2020-03-08 SURGICAL SUPPLY — 49 items
APL PRP STRL LF DISP 70% ISPRP (MISCELLANEOUS) ×1
APL SKNCLS STERI-STRIP NONHPOA (GAUZE/BANDAGES/DRESSINGS) ×1
BENZOIN TINCTURE PRP APPL 2/3 (GAUZE/BANDAGES/DRESSINGS) ×3
BINDER ABDOMINAL 12 ML 46-62 (SOFTGOODS) ×3
BIOPATCH RED 1 DISK 7.0 (GAUZE/BANDAGES/DRESSINGS) ×2
BIOPATCH RED 1IN DISK 7.0MM (GAUZE/BANDAGES/DRESSINGS) ×1
CANISTER SUCT 3000ML PPV (MISCELLANEOUS) ×3
CHLORAPREP W/TINT 26 (MISCELLANEOUS) ×3
CLOSURE WOUND 1/2 X4 (GAUZE/BANDAGES/DRESSINGS) ×1
COVER SURGICAL LIGHT HANDLE (MISCELLANEOUS) ×3
DRAIN CHANNEL 19F RND (DRAIN) ×3
DRAPE LAPAROSCOPIC ABDOMINAL (DRAPES) ×3
DRSG OPSITE POSTOP 4X10 (GAUZE/BANDAGES/DRESSINGS) ×3
DRSG TEGADERM 4X4.75 (GAUZE/BANDAGES/DRESSINGS) ×3
ELECT BLADE 4.0 EZ CLEAN MEGAD (MISCELLANEOUS) ×3
ELECT CAUTERY BLADE 6.4 (BLADE) ×3
ELECT REM PT RETURN 9FT ADLT (ELECTROSURGICAL) ×3
EVACUATOR SILICONE 100CC (DRAIN) ×3
GLOVE BIO SURGEON STRL SZ7 (GLOVE) ×3
GLOVE BIOGEL PI IND STRL 7.5 (GLOVE) ×1
GLOVE BIOGEL PI INDICATOR 7.5 (GLOVE) ×2
GOWN STRL REUS W/ TWL LRG LVL3 (GOWN DISPOSABLE) ×3
GOWN STRL REUS W/TWL LRG LVL3 (GOWN DISPOSABLE) ×9
KIT BASIN OR (CUSTOM PROCEDURE TRAY) ×3
KIT TURNOVER KIT B (KITS) ×3
MESH VENTRALIGHT ST 6X8 (Mesh Specialty) ×3 IMPLANT
MESH VENTRLGHT ELLIPSE 8X6XMFL (Mesh Specialty) ×1 IMPLANT
NEEDLE HYPO 25GX1X1/2 BEV (NEEDLE) ×3
NS IRRIG 1000ML POUR BTL (IV SOLUTION) ×6
PACK GENERAL/GYN (CUSTOM PROCEDURE TRAY) ×3
PAD ARMBOARD 7.5X6 YLW CONV (MISCELLANEOUS) ×6
PENCIL SMOKE EVACUATOR (MISCELLANEOUS) ×3
SPONGE LAP 18X18 RF (DISPOSABLE) ×3
STAPLER VISISTAT 35W (STAPLE) ×3
STRIP CLOSURE SKIN 1/2X4 (GAUZE/BANDAGES/DRESSINGS) ×2
SUCTION POOLE HANDLE (INSTRUMENTS) ×3
SUT ETHILON 2 0 FS 18 (SUTURE) ×3
SUT MNCRL AB 4-0 PS2 18 (SUTURE) ×3
SUT NOVA NAB DX-16 0-1 5-0 T12 (SUTURE) ×6
SUT NOVA NAB GS-21 0 18 T12 DT (SUTURE) ×9
SUT NOVA NAB GS-21 1 T12 (SUTURE) ×3
SUT SILK 3 0 SH CR/8 (SUTURE) ×3
SUT VIC AB 3-0 SH 27 (SUTURE) ×6
SUT VIC AB 3-0 SH 27X BRD (SUTURE) ×1
SUT VIC AB 3-0 SH 27XBRD (SUTURE) ×1
SYR BULB IRRIG 60ML STRL (SYRINGE) ×3
SYR CONTROL 10ML LL (SYRINGE) ×3
TOWEL GREEN STERILE (TOWEL DISPOSABLE) ×3
TOWEL GREEN STERILE FF (TOWEL DISPOSABLE) ×3

## 2020-03-08 NOTE — H&P (Signed)
History of Present Illness [name]. [name] [name]; 01/11/2020 11:51 AM) The patient is a 74 year old male who presents with an incisional hernia. Former patient of Dr. Donell Beers  "Patient is a 74 year old male who our service saw in the hospital for cholecystitis and adjacent liver abscess. He received a percutaneous cholecystostomy tube 05/11/18. Patient's history is notable for having a cardiac cath in the fall of 2019 demonstrating 4 blockages. He was scheduled for a CABG and then was found to have a tooth abscess. He eventually had CABG x4 on March 31, 2018. His postop course was complicated by atrial fibrillation and by aspiration pneumonia. He went home for short period of time but came back quite ill. He was seen to have a large liver abscess as well as an inflamed appearing gallbladder with a thickened gallbladder wall. Neoplasm was not excluded. He required IV antibiotics via PICC line at home for a while after discharge. He has been seen by infectious disease and just got his PICC line and IV antibiotics discontinued. Also of note, he was admitted to [facility] around 8 years ago with significant gallbladder infection as well. MR was performed and did not show any suspicion for gallbladder mass He got perc drain capped. He was set up for lap chole in march 2020, but it got delayed until June due to COVID. He required readmission for gallbladder fossa abscess and got perc drain. Since then he has been readmitted several times and has had to go to the ED for dehydration once. 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 His drain output came down quite a bit and he stopped flushing it. He was doing well. He accidentally cut through it so we were doing a trial of seeing what happened just dressing the drain terminus, but he started having lots of drainage around the tube so it was switched out for a new drain 12/09/2018.   He had surgery 04/07/19 for takedown and repair of the  duodenal fistula. He did relatively well post op. He went home on POD 6. His surgical drain was removed prior to his discharge as it was non bilious. Unfortunately, he developed significant nausea/vomiting and malaise. He got outpatient labs showing WBCs of 16k and a CT showing a repeat fluid collection. He was readmitted 12/31 and discharged 1/6 with new perc drain placed. This one was NON bilious, but grew fusobacterium nucleatum. Dr. Orvan Falconer saw him and recommended outpatient IV antibiotics.   Over the last month, his drain has been removed and he is now off antibiotics. His taste buds have finally started to come back. He still feels weak, but is slowly improving. His outdoor walking has been hampered by the bad weather. " Over the last 6 months, the patient has noted some bulging under his incision. This has enlarged. CT scan showed a broad shallow ventral hernia. He denies any pain or obstructive symptoms related to this. He is referred to me for evaluation for hernia repair. He is scheduled to see his cardiologist in the next month. He remains fairly active. He plays golf on a regular basis and goes to the gym 3 times a week.      Problem List/Past Medical [name] K. 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 [name], [name]; 01/11/2020 11:51 AM) Anal Fissure Repair Bypass Surgery for Poor Blood Flow to Legs Liver Surgery Tonsillectomy  Diagnostic Studies History [name]. [name], [name]; 01/11/2020 11:51 AM) Colonoscopy 1-5 years ago  Allergies [name], [name]; 01/11/2020 10:14 AM) Cipro *FLUOROQUINOLONES* Difficulty  breathing. Erythromycin *MACROLIDES* Allergies Reconciled  Medication History [name], [name]; 01/11/2020 10:14 AM) metFORMIN HCl (1000MG  Tablet, Oral) Active. Losartan Potassium (50MG  Tablet, Oral) Active. Eliquis (5MG  Tablet, Oral) Active. Nitroglycerin (0.4MG  Tab Sublingual, Sublingual) Active. Furosemide (40MG  Tablet, Oral) Active. Metoprolol Tartrate (25MG  Tablet, Oral) Active. Amiodarone HCl (200MG  Tablet, Oral) Active. Finasteride (5MG  Tablet, Oral) Active. Iron (325 (65 Fe)MG Tablet, 1 tab Oral two times daily) Active. amLODIPine Besylate (2.5MG  Tablet, Oral) Active.  Social History . [name], [name]; 01/11/2020 11:51 AM) Alcohol use Occasional alcohol use. Caffeine use Coffee. No drug use Tobacco use Former smoker.  Family History . [name], [name]; 01/11/2020 11:51 AM) Colon Cancer Father. Colon Polyps Father.  Other Problems . [name], [name]; 01/11/2020 11:51 AM) Atrial Fibrillation Diabetes Mellitus Hepatitis High blood pressure Hypercholesterolemia    Vitals [name]; 01/11/2020 10:15 AM) 01/11/2020 10:15 AM Weight: 250.25 lb Height: 72in Body Surface Area: 2.34 m Body Mass Index: 33.94 kg/m  Temp.: 98.32F  Pulse: 64 (Regular)  P.OX: 93% (Room air) BP: 124/86(Sitting, Left Arm, Standard)        Physical Exam [name] [name] [name]; 01/11/2020 11:57 AM)  The physical exam findings are as follows: Note:Constitutional: WDWN in NAD, conversant, no obvious deformities; resting comfortably Eyes: Pupils equal, round; sclera anicteric; moist conjunctiva; no lid lag HENT: Oral mucosa moist; good dentition Neck: No masses palpated, trachea midline; no thyromegaly Lungs: CTA bilaterally; normal respiratory effort Chest: healed median sternotomy incision CV: Regular rate and rhythm; no murmurs; extremities well-perfused with no edema Abd: +bowel sounds, soft, obese, healed midline  incision; large ventral hernia from xiphoid to umbilicus - soft, reducible; cannot clearly palpate fascial edges Musc: Normal gait; no apparent clubbing or cyanosis in extremities Lymphatic: No palpable cervical or axillary lymphadenopathy Skin: Warm, dry; no sign of jaundice Psychiatric - alert and oriented x 4; calm mood and affect    Assessment & Plan [name] K. Danny Zimny [name]; 01/11/2020 11:59 AM)  VENTRAL INCISIONAL HERNIA WITHOUT OBSTRUCTION OR GANGRENE (K43.2)  Current Plans Schedule for Surgery - Open ventral hernia repair with mesh. The surgical procedure has been discussed with the patient. Potential risks, benefits, alternative treatments, and expected outcomes have been explained. All of the patient's questions at this time have been answered. The likelihood of reaching the patient's treatment goal is good. The patient understand the proposed surgical procedure and wishes to proceed.   Misty Stanley. 01/13/2020, [name], [facility] Surgery  General/ Trauma Surgery   03/08/2020 8:36 AM

## 2020-03-08 NOTE — Op Note (Addendum)
Ventral Hernia Repair Procedure Note  Indications: The patient is a 74 year old male who presents with an incisional hernia. Former patient of Dr. Donell Beers   "Patient is a 74 year old male who our service saw in the hospital for cholecystitis and adjacent liver abscess.  He received a percutaneous cholecystostomy tube 05/11/18.  Patient's history is notable for having a cardiac cath in the fall of 2019 demonstrating 4 blockages.  He was scheduled for a CABG and then was found to have a tooth abscess.  He eventually had CABG x4 on March 31, 2018.  His postop course was complicated by atrial fibrillation and by aspiration pneumonia.  He went home for short period of time but came back quite ill.  He was seen to have a large liver abscess as well as an inflamed appearing gallbladder with a thickened gallbladder wall.  Neoplasm was not excluded.  He required IV antibiotics via PICC line at home for a while after discharge.   He has been seen by infectious disease and just got his PICC line and IV antibiotics discontinued. Also of note, he was admitted to [facility] around 8 years ago with significant gallbladder infection as well.   MR was performed and did not show any suspicion for gallbladder mass He got perc drain capped.  He was set up for lap chole in march 2020, but it got delayed until June due to COVID.  He required readmission for gallbladder fossa abscess and got perc drain.  Since then he has been readmitted several times and has had to go to the ED for dehydration once.  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  His drain output came down quite a bit and he stopped flushing it.  He was doing well.  He accidentally cut through it so we were doing a trial of seeing what happened just dressing the drain terminus, but he started having lots of drainage around the tube so it was switched out for a new drain 12/09/2018.     He had surgery 04/07/19 for takedown and repair of the duodenal fistula.   He did relatively well post op.  He went home on POD 6.  His surgical drain was removed prior to his discharge as it was non bilious.  Unfortunately, he developed significant nausea/vomiting and malaise.  He got outpatient labs showing WBCs of 16k and a CT showing a repeat fluid collection.  He was readmitted 12/31 and discharged 1/6 with new perc drain placed.  This one was NON bilious, but grew fusobacterium nucleatum.  Dr. Orvan Falconer saw him and recommended outpatient IV antibiotics.     Over the last month, his drain has been removed and he is now off antibiotics.  His taste buds have finally started to come back.  He still feels weak, but is slowly improving.  His outdoor walking has been hampered by the bad weather.  " Over the last 6 months, the patient has noted some bulging under his incision.  This has enlarged.  CT scan showed a broad shallow ventral hernia.  He denies any pain or obstructive symptoms related to this.  He is referred to me for evaluation for hernia repair.  He is scheduled to see his cardiologist in the next month.  He remains fairly active.  He plays golf on a regular basis and goes to the gym 3 times a week.  CT scan shows multiple swiss cheese defects.    Pre-operative Diagnosis: Ventral incisional  hernia  Post-operative Diagnosis: Ventral incisional hernia  Surgeon: Manus Rudd, MD  Assistants: Lannette Donath, MD  Anesthesia: General endotracheal anesthesia  ASA Class: 3  Procedure Details  The patient was seen in the Holding Room. The risks, benefits, complications, treatment options, and expected outcomes were discussed with the patient. The possibilities of reaction to medication, pulmonary aspiration, perforation of viscus, bleeding, recurrent infection, the need for additional procedures, failure to diagnose a condition, and creating a complication requiring transfusion or operation were discussed with the patient. The patient concurred with the proposed plan,  giving informed consent.  The site of surgery properly noted/marked. The patient was taken to the operating room, identified as [name] and the procedure verified as ventral hernia repair. A Time Out was held and the above information confirmed.  The patient was placed supine.  After establishing general anesthesia, the abdomen was prepped with Chloraprep and draped in sterile fashion.  We made a vertical incision over the palpable incisional hernia between the xiphoid and the umbilicus. Dissection was carried down to the hernia sac located above the fascia and mobilized from surrounding structures.  Intact fascia was identified circumferentially around the multiple swiss cheese defects.  We combined all of these into a single 9 x 14 cm defect  We used a 20 x 15 cm mesh and secured this to the fascia with interrupted transfacial 0 Novofil sutures.  The fascial defect was reapproximated with interrupted figure-of-8 1 Novofil sutures.  The subcutaneous tissues were irrigated. A 19 Fr Blake drain was passed through the skin and subcutaneous tissue in the right abdomen and positioned above the fascia in the subcutaneous space and secured with a Nylon suture. The deep dermal layer was closed with a running 3-0 Vicryl suture and the skin was closed with staples.  Hemostasis was confirmed prior to closure. A sterile dressings were applied.   Instrument, sponge, and needle counts were correct prior to closure and at the conclusion of the case.   Findings: 14 cm x 9 cm ventral hernia with multiple midline defects  Estimated Blood Loss:   50 ml         Drains: 19 Fr Blake drain - subcutaneous space                      Complications:  None; patient tolerated the procedure well.         Disposition: PACU - hemodynamically stable.         Condition: stable  I was present for the critical and key portions of the surgery and I was immediately available throughout the enitre procedure.  I have reviewed  and agree with the operative note as documented by the resident.   Wilmon Arms. Corliss Skains, MD, [facility]  General/ Trauma Surgery   03/08/2020 12:24 PM

## 2020-03-08 NOTE — Anesthesia Procedure Notes (Signed)
Procedure Name: Intubation Performed by: Milford Cage, CRNA Pre-anesthesia Checklist: Patient identified, Emergency Drugs available, Suction available and Patient being monitored Patient Re-evaluated:Patient Re-evaluated prior to induction Oxygen Delivery Method: Circle System Utilized Preoxygenation: Pre-oxygenation with 100% oxygen Induction Type: IV induction Ventilation: Mask ventilation without difficulty and Oral airway inserted - appropriate to patient size Laryngoscope Size: Mac and 3 Grade View: Grade I Tube type: Oral Tube size: 7.5 mm Number of attempts: 1 Airway Equipment and Method: Stylet and Oral airway Placement Confirmation: ETT inserted through vocal cords under direct vision,  positive ETCO2 and breath sounds checked- equal and bilateral Secured at: 24 cm Tube secured with: Tape Dental Injury: Teeth and Oropharynx as per pre-operative assessment

## 2020-03-08 NOTE — Transfer of Care (Signed)
Immediate Anesthesia Transfer of Care Note  Patient: TEE RICHESON  Procedure(s) Performed: OPEN VENTRAL HERNIA REPAIR WITH MESH (N/A Abdomen) INSERTION OF MESH (N/A Abdomen)  Patient Location: PACU  Anesthesia Type:General  Level of Consciousness: awake  Airway & Oxygen Therapy: Patient Spontanous Breathing and Patient connected to nasal cannula oxygen  Post-op Assessment: Report given to RN and Post -op Vital signs reviewed and stable  Post vital signs: Reviewed and stable  Last Vitals:  Vitals Value Taken Time  BP    Temp    Pulse    Resp    SpO2      Last Pain:  Vitals:   03/08/20 0910  TempSrc:   PainSc: 0-No pain         Complications: No complications documented.

## 2020-03-08 NOTE — Progress Notes (Signed)
Pt has not voided since surgery, Bladder scan showed 152 ml. Pt states has no urge to pee. On call Dr. Janee Morn made aware with no new order. Will cont to monitor.

## 2020-03-09 ENCOUNTER — Encounter (HOSPITAL_COMMUNITY): Payer: Self-pay | Admitting: Surgery

## 2020-03-09 DIAGNOSIS — K432 Incisional hernia without obstruction or gangrene: Secondary | ICD-10-CM | POA: Diagnosis not present

## 2020-03-09 LAB — GLUCOSE, CAPILLARY
Glucose-Capillary: 135 mg/dL — ABNORMAL HIGH (ref 70–99)
Glucose-Capillary: 143 mg/dL — ABNORMAL HIGH (ref 70–99)
Glucose-Capillary: 126 mg/dL — ABNORMAL HIGH (ref 70–99)
Glucose-Capillary: 146 mg/dL — ABNORMAL HIGH (ref 70–99)

## 2020-03-09 LAB — CBC
HCT: 36.3 % — ABNORMAL LOW (ref 39.0–52.0)
Hemoglobin: 12 g/dL — ABNORMAL LOW (ref 13.0–17.0)
MCH: 31.2 pg (ref 26.0–34.0)
MCHC: 33.1 g/dL (ref 30.0–36.0)
MCV: 94.3 fL (ref 80.0–100.0)
Platelets: 239 K/uL (ref 150–400)
RBC: 3.85 MIL/uL — ABNORMAL LOW (ref 4.22–5.81)
RDW: 13.4 % (ref 11.5–15.5)
WBC: 14 K/uL — ABNORMAL HIGH (ref 4.0–10.5)
nRBC: 0 % (ref 0.0–0.2)

## 2020-03-09 LAB — BASIC METABOLIC PANEL
Anion gap: 10 (ref 5–15)
BUN: 26 mg/dL — ABNORMAL HIGH (ref 8–23)
CO2: 20 mmol/L — ABNORMAL LOW (ref 22–32)
Calcium: 9 mg/dL (ref 8.9–10.3)
Chloride: 103 mmol/L (ref 98–111)
Creatinine, Ser: 1.8 mg/dL — ABNORMAL HIGH (ref 0.61–1.24)
GFR, Estimated: 38 mL/min — ABNORMAL LOW (ref >60)
Glucose, Bld: 137 mg/dL — ABNORMAL HIGH (ref 70–99)
Potassium: 5.6 mmol/L — ABNORMAL HIGH (ref 3.5–5.1)
Sodium: 133 mmol/L — ABNORMAL LOW (ref 135–145)

## 2020-03-09 MED ORDER — LACTATED RINGERS IV BOLUS
500.0000 mL | Freq: Once | INTRAVENOUS | Status: DC
Start: 2020-03-09 — End: 2020-03-09

## 2020-03-09 MED ORDER — SODIUM CHLORIDE 0.9 % IV BOLUS
500.0000 mL | Freq: Once | INTRAVENOUS | Status: AC
Start: 2020-03-09 — End: 2020-03-09
  Administered 2020-03-09: 500 mL via INTRAVENOUS

## 2020-03-09 MED ORDER — METHOCARBAMOL 500 MG PO TABS
500.0000 mg | Freq: Four times a day (QID) | ORAL | 0 refills | Status: DC | PRN
Start: 2020-03-09 — End: 2020-08-11

## 2020-03-09 MED ORDER — SODIUM CHLORIDE 0.9 % IV SOLN
INTRAVENOUS | Status: DC
Start: 2020-03-09 — End: 2020-03-10

## 2020-03-09 MED ORDER — OXYCODONE HCL 5 MG PO TABS
5.0000 mg | Freq: Four times a day (QID) | ORAL | 0 refills | Status: DC | PRN
Start: 2020-03-09 — End: 2020-07-12

## 2020-03-09 NOTE — Anesthesia Postprocedure Evaluation (Signed)
Anesthesia Post Note  Patient: [name]  Procedure(s) Performed: OPEN VENTRAL HERNIA REPAIR WITH MESH (N/A Abdomen) INSERTION OF MESH (N/A Abdomen)     Patient location during evaluation: PACU Anesthesia Type: General Level of consciousness: awake and alert Pain management: pain level controlled Vital Signs Assessment: post-procedure vital signs reviewed and stable Respiratory status: spontaneous breathing, nonlabored ventilation, respiratory function stable and patient connected to nasal cannula oxygen Cardiovascular status: blood pressure returned to baseline and stable Postop Assessment: no apparent nausea or vomiting Anesthetic complications: no   No complications documented.  Last Vitals:  Vitals:   03/09/20 1439 03/09/20 2025  BP: (!) 142/71 (!) 158/73  Pulse: 66 90  Resp: 20 18  Temp: 36.9 C 37.4 C  SpO2: 93% 93%    Last Pain:  Vitals:   03/09/20 1803  TempSrc:   PainSc: 10-Worst pain ever                 Makensey Rego S

## 2020-03-09 NOTE — Discharge Instructions (Signed)
[facility]      [facility] [phone]  OPEN ABDOMINAL SURGERY: POST OP INSTRUCTIONS  Always review your discharge instruction sheet given to you by the facility where your surgery was performed.  IF YOU HAVE DISABILITY OR FAMILY LEAVE FORMS, YOU MUST BRING THEM TO THE OFFICE FOR PROCESSING.  PLEASE DO NOT GIVE THEM TO YOUR DOCTOR.  1. A prescription for pain medication may be given to you upon discharge.  Take your pain medication as prescribed, if needed.  If narcotic pain medicine is not needed, then you may take acetaminophen (Tylenol) or ibuprofen (Advil) as needed. 2. Take your usually prescribed medications unless otherwise directed. 3. If you need a refill on your pain medication, please contact your pharmacy. They will contact our office to request authorization.  Prescriptions will not be filled after 5pm or on week-ends. 4. You should follow a light diet the first few days after arrival home, such as soup and crackers, pudding, etc.unless your doctor has advised otherwise. A high-fiber, low fat diet can be resumed as tolerated.   Be sure to include lots of fluids daily. Most patients will experience some swelling and bruising on the chest and neck area.  Ice packs will help.  Swelling and bruising can take several days to resolve 5. Most patients will experience some swelling and bruising in the area of the incision. Ice pack will help. Swelling and bruising can take several days to resolve..  6. It is common to experience some constipation if taking pain medication after surgery.  Increasing fluid intake and taking a stool softener will usually help or prevent this problem from occurring.  A mild laxative (Milk of Magnesia or Miralax) should be taken according to package directions if there are no bowel movements after 48 hours. 7.  You may have steri-strips (small skin tapes) in place directly over the incision.  These strips should be left on the skin for 7-10 days.  If your  surgeon used skin glue on the incision, you may shower in 24 hours.  The glue will flake off over the next 2-3 weeks.  Any sutures or staples will be removed at the office during your follow-up visit. You may find that a light gauze bandage over your incision may keep your staples from being rubbed or pulled. You may shower and replace the bandage daily. 8. ACTIVITIES:  You may resume regular (light) daily activities beginning the next day--such as daily self-care, walking, climbing stairs--gradually increasing activities as tolerated.  You may have sexual intercourse when it is comfortable.  Refrain from any heavy lifting or straining until approved by your doctor. a. You may drive when you no longer are taking prescription pain medication, you can comfortably wear a seatbelt, and you can safely maneuver your car and apply brakes b. Return to Work: ___________________________________ 9. You should see your doctor in the office for a follow-up appointment approximately two weeks after your surgery.  Make sure that you call for this appointment within a day or two after you arrive home to insure a convenient appointment time. OTHER INSTRUCTIONS:  _____________________________________________________________ _____________________________________________________________  WHEN TO CALL YOUR DOCTOR: 1. Fever over 101.0 2. Inability to urinate 3. Nausea and/or vomiting 4. Extreme swelling or bruising 5. Continued bleeding from incision. 6. Increased pain, redness, or drainage from the incision. 7. Difficulty swallowing or breathing 8. Muscle cramping or spasms. 9. Numbness or tingling in hands or feet or around lips.  The clinic staff is available to   answer your questions during regular business hours.  Please don't hesitate to call and ask to speak to one of the nurses if you have concerns.  For further questions, please visit [email or web address]   

## 2020-03-09 NOTE — Progress Notes (Signed)
1 Day Post-Op   Subjective/Chief Complaint: Patient had some urinary retention, but is now voiding well. Some AKI with increased Cr/ K - increased hydration today Pain better controlled No BM yet Tolerating diet without nausea   Objective: Vital signs in last 24 hours: Temp:  [98 F (36.7 C)-98.6 F (37 C)] 98.4 F (36.9 C) (11/10 1439) Pulse Rate:  [64-94] 66 (11/10 1439) Resp:  [16-20] 20 (11/10 1439) BP: (126-153)/(64-83) 142/71 (11/10 1439) SpO2:  [92 %-95 %] 93 % (11/10 1439) Last BM Date: 03/07/20  Intake/Output from previous day: 11/09 0701 - 11/10 0700 In: 1647.4 [P.O.:120; I.V.:1527.4] Out: 220 [Drains:120; Blood:100] Intake/Output this shift: Total I/O In: 818.7 [I.V.:506.4; IV Piggyback:312.3] Out: 460 [Urine:430; Drains:30]  General appearance: alert, cooperative and no distress GI: soft, incisional tenderness; drain with serosanguinous output Midline incision - staple line intact  Lab Results:  Recent Labs    03/09/20 0148  WBC 14.0*  HGB 12.0*  HCT 36.3*  PLT 239   BMET Recent Labs    03/09/20 0148  NA 133*  K 5.6*  CL 103  CO2 20*  GLUCOSE 137*  BUN 26*  CREATININE 1.80*  CALCIUM 9.0   PT/INR No results for input(s): LABPROT, INR in the last 72 hours. ABG No results for input(s): PHART, HCO3 in the last 72 hours.  Invalid input(s): PCO2, PO2  Studies/Results: No results found.  Anti-infectives: Anti-infectives (From admission, onward)   Start     Dose/Rate Route Frequency Ordered Stop   03/08/20 0900  ceFAZolin (ANCEF) IVPB 2g/100 mL premix        2 g 200 mL/hr over 30 Minutes Intravenous On call to O.R. 03/08/20 0858 03/08/20 1002      Assessment/Plan: s/p Procedure(s): OPEN VENTRAL HERNIA REPAIR WITH MESH (N/A) INSERTION OF MESH (N/A) AKI - Cr up to 1.80 - hydration  Recheck labs in AM - if Cr improving will likely be ready for discharge. Pain seems to be controlled with PO pain meds   LOS: 0 days    [name] 03/09/2020

## 2020-03-10 DIAGNOSIS — K432 Incisional hernia without obstruction or gangrene: Secondary | ICD-10-CM | POA: Diagnosis not present

## 2020-03-10 LAB — BASIC METABOLIC PANEL
Anion gap: 8 (ref 5–15)
BUN: 24 mg/dL — ABNORMAL HIGH (ref 8–23)
CO2: 20 mmol/L — ABNORMAL LOW (ref 22–32)
Calcium: 8.7 mg/dL — ABNORMAL LOW (ref 8.9–10.3)
Chloride: 104 mmol/L (ref 98–111)
Creatinine, Ser: 1.55 mg/dL — ABNORMAL HIGH (ref 0.61–1.24)
GFR, Estimated: 46 mL/min — ABNORMAL LOW (ref >60)
Glucose, Bld: 142 mg/dL — ABNORMAL HIGH (ref 70–99)
Potassium: 4.2 mmol/L (ref 3.5–5.1)
Sodium: 132 mmol/L — ABNORMAL LOW (ref 135–145)

## 2020-03-10 LAB — GLUCOSE, CAPILLARY
Glucose-Capillary: 147 mg/dL — ABNORMAL HIGH (ref 70–99)
Glucose-Capillary: 133 mg/dL — ABNORMAL HIGH (ref 70–99)

## 2020-03-10 NOTE — Progress Notes (Signed)
2 Days Post-Op from an open ventral hernia repair with mesh placement and drain placement   Subjective/Chief Complaint: - No acute events overnight - Urinary retention resolved.  - Cr down to patient's baseline - Pain appropriately controlled - Patient able to ambulate independently, but does note some unsteadiness when ambulating    Objective: Vital signs in last 24 hours: Temp:  [98.2 F (36.8 C)-99.3 F (37.4 C)] 98.5 F (36.9 C) (11/11 0551) Pulse Rate:  [66-90] 80 (11/11 0551) Resp:  [18-20] 19 (11/11 0551) BP: (141-158)/(64-73) 152/68 (11/11 0551) SpO2:  [93 %] 93 % (11/11 0551) Last BM Date: 03/07/20  Intake/Output from previous day: 11/10 0701 - 11/11 0700 In: 2499.3 [P.O.:360; I.V.:1827; IV Piggyback:312.3] Out: 1615 [Urine:1530; Drains:85] Intake/Output this shift: No intake/output data recorded.  General appearance: alert, cooperative and no distress GI: soft, incisional tenderness; drain with serosanguinous output Midline incision - staple line intact  Lab Results:  Recent Labs    03/09/20 0148  WBC 14.0*  HGB 12.0*  HCT 36.3*  PLT 239   BMET Recent Labs    03/09/20 0148 03/10/20 0032  NA 133* 132*  K 5.6* 4.2  CL 103 104  CO2 20* 20*  GLUCOSE 137* 142*  BUN 26* 24*  CREATININE 1.80* 1.55*  CALCIUM 9.0 8.7*   PT/INR No results for input(s): LABPROT, INR in the last 72 hours. ABG No results for input(s): PHART, HCO3 in the last 72 hours.  Invalid input(s): PCO2, PO2  Studies/Results: No results found.  Anti-infectives: Anti-infectives (From admission, onward)   Start     Dose/Rate Route Frequency Ordered Stop   03/08/20 0900  ceFAZolin (ANCEF) IVPB 2g/100 mL premix        2 g 200 mL/hr over 30 Minutes Intravenous On call to O.R. 03/08/20 0858 03/08/20 1002      Assessment/Plan: s/p Procedure(s): OPEN VENTRAL HERNIA REPAIR WITH MESH (N/A) INSERTION OF MESH (N/A)  - Off IVF - Will re-assess in the afternoon, if he's clinically  stable and amenable to discharge, will discharge to home given that his daughter is staying with him in the post-operative period.  Lannette Donath  General Surgery  03/10/2020

## 2020-03-10 NOTE — Plan of Care (Signed)

## 2020-03-10 NOTE — Plan of Care (Signed)
  Problem: Education: Goal: Knowledge of General Education information will improve Description: Including pain rating scale, medication(s)/side effects and non-pharmacologic comfort measures 03/10/2020 0138 by Caroll Rancher, RN Outcome: Progressing 03/10/2020 0011 by Caroll Rancher, RN Outcome: Progressing   Problem: Health Behavior/Discharge Planning: Goal: Ability to manage health-related needs will improve 03/10/2020 0138 by Caroll Rancher, RN Outcome: Progressing 03/10/2020 0011 by Caroll Rancher, RN Outcome: Progressing   Problem: Clinical Measurements: Goal: Ability to maintain clinical measurements within normal limits will improve 03/10/2020 0138 by Caroll Rancher, RN Outcome: Progressing 03/10/2020 0011 by Caroll Rancher, RN Outcome: Progressing Goal: Will remain free from infection 03/10/2020 0138 by Caroll Rancher, RN Outcome: Progressing 03/10/2020 0011 by Caroll Rancher, RN Outcome: Progressing Goal: Diagnostic test results will improve 03/10/2020 0138 by Caroll Rancher, RN Outcome: Progressing 03/10/2020 0011 by Caroll Rancher, RN Outcome: Progressing Goal: Respiratory complications will improve 03/10/2020 0138 by Caroll Rancher, RN Outcome: Progressing 03/10/2020 0011 by Caroll Rancher, RN Outcome: Progressing Goal: Cardiovascular complication will be avoided 03/10/2020 0138 by Caroll Rancher, RN Outcome: Progressing 03/10/2020 0011 by Caroll Rancher, RN Outcome: Progressing   Problem: Activity: Goal: Risk for activity intolerance will decrease 03/10/2020 0138 by Caroll Rancher, RN Outcome: Progressing 03/10/2020 0011 by Caroll Rancher, RN Outcome: Progressing   Problem: Nutrition: Goal: Adequate nutrition will be maintained 03/10/2020 0138 by Caroll Rancher, RN Outcome: Progressing 03/10/2020 0011 by Caroll Rancher, RN Outcome: Progressing   Problem: Coping: Goal: Level of anxiety will decrease 03/10/2020  0138 by Caroll Rancher, RN Outcome: Progressing 03/10/2020 0011 by Caroll Rancher, RN Outcome: Progressing   Problem: Elimination: Goal: Will not experience complications related to bowel motility 03/10/2020 0138 by Caroll Rancher, RN Outcome: Progressing 03/10/2020 0011 by Caroll Rancher, RN Outcome: Progressing Goal: Will not experience complications related to urinary retention 03/10/2020 0138 by Caroll Rancher, RN Outcome: Progressing 03/10/2020 0011 by Caroll Rancher, RN Outcome: Progressing   Problem: Pain Managment: Goal: General experience of comfort will improve 03/10/2020 0138 by Caroll Rancher, RN Outcome: Progressing 03/10/2020 0011 by Caroll Rancher, RN Outcome: Progressing   Problem: Safety: Goal: Ability to remain free from injury will improve 03/10/2020 0138 by Caroll Rancher, RN Outcome: Progressing 03/10/2020 0011 by Caroll Rancher, RN Outcome: Progressing   Problem: Skin Integrity: Goal: Risk for impaired skin integrity will decrease 03/10/2020 0138 by Caroll Rancher, RN Outcome: Progressing 03/10/2020 0011 by Caroll Rancher, RN Outcome: Progressing

## 2020-03-11 NOTE — Discharge Summary (Signed)
Physician Discharge Summary  Patient ID: [name] MRN: [id number] DOB/AGE: 75-Mar-1944 75 y.o.  Admit date: 03/08/2020 Discharge date: 03/10/20  Admission Diagnoses:  Ventral incisional hernia  Discharge Diagnoses: same Active Problems:   Ventral incisional hernia   Discharged Condition: good  Hospital Course:  The patient presented with a large upper midline ventral hernia after multiple previous abdominal surgeries.  He underwent open repair with underlay mesh on 11/9.  He developed some urinary retention and mild AKI on POD #1 so he was further hydrated.  His renal function improved.  His pain was controlled with PO PRN meds.  He is ready for discharge on POD #2.    Treatments: surgery: Open ventral hernia repair with mesh  Discharge Exam: Blood pressure (!) 152/68, pulse 80, temperature 98.5 F (36.9 C), resp. rate 19, height 5\' 11"  (1.803 m), weight 117.1 kg, SpO2 93 %. General appearance: alert, cooperative and no distress GI: soft, incisional tenderness; drain with serosanguinous output Midline incision - staple line intact   Disposition: Discharge disposition: 01-Home or Self Care        Allergies as of 03/10/2020      Reactions   Flagyl [metronidazole] Nausea And Vomiting   Ciprofloxacin Rash   Erythromycin Ethylsuccinate Rash      Medication List    STOP taking these medications   acetaminophen 500 MG tablet Commonly known as: TYLENOL     TAKE these medications   amLODipine 10 MG tablet Commonly known as: NORVASC Take 10 mg by mouth daily.   aspirin EC 81 MG tablet Take 81 mg by mouth every evening.   B-12 5000 MCG Caps Take 5,000 mcg by mouth daily.   cetirizine 10 MG tablet Commonly known as: ZYRTEC Take 10 mg by mouth daily.   cholecalciferol 25 MCG (1000 UNIT) tablet Commonly known as: VITAMIN D Take 1,000 Units by mouth in the morning and at bedtime.   finasteride 5 MG tablet Commonly known as: PROSCAR Take 5 mg by mouth  daily.   loratadine 10 MG tablet Commonly known as: CLARITIN Take 10 mg by mouth daily.   metFORMIN 1000 MG tablet Commonly known as: GLUCOPHAGE Take 1,000 mg by mouth 2 (two) times daily.   metFORMIN 500 MG 24 hr tablet Commonly known as: GLUCOPHAGE-XR Take 500 mg by mouth 2 (two) times daily.   methocarbamol 500 MG tablet Commonly known as: ROBAXIN Take 1 tablet (500 mg total) by mouth every 6 (six) hours as needed for muscle spasms.   metoprolol tartrate 25 MG tablet Commonly known as: LOPRESSOR TAKE ONE TABLET BY MOUTH TWICE DAILY   metoprolol tartrate 50 MG tablet Commonly known as: LOPRESSOR Take 50 mg by mouth in the morning and at bedtime.   MULTIVITAMIN ADULT PO Take 1 tablet by mouth daily.   oxyCODONE 5 MG immediate release tablet Commonly known as: Oxy IR/ROXICODONE Take 1 tablet (5 mg total) by mouth every 6 (six) hours as needed for moderate pain.       Follow-up Information    13/02/2020, MD Follow up in 1 week(s).   Specialty: General Surgery Contact information: [address] [place] [phone]               Signed: [phone] 03/11/2020, 10:12 AM

## 2020-07-12 ENCOUNTER — Ambulatory Visit: Payer: Medicare HMO | Admitting: Cardiovascular Disease

## 2020-07-12 ENCOUNTER — Encounter: Payer: Self-pay | Admitting: Cardiovascular Disease

## 2020-07-12 ENCOUNTER — Other Ambulatory Visit: Payer: Self-pay

## 2020-07-12 ENCOUNTER — Encounter

## 2020-07-12 VITALS — BP 140/78 | HR 54 | Ht 71.0 in | Wt 259.0 lb

## 2020-07-12 DIAGNOSIS — I10 Essential (primary) hypertension: Secondary | ICD-10-CM | POA: Diagnosis not present

## 2020-07-12 DIAGNOSIS — G4733 Obstructive sleep apnea (adult) (pediatric): Secondary | ICD-10-CM | POA: Insufficient documentation

## 2020-07-12 DIAGNOSIS — E782 Mixed hyperlipidemia: Secondary | ICD-10-CM

## 2020-07-12 DIAGNOSIS — I483 Typical atrial flutter: Secondary | ICD-10-CM

## 2020-07-12 DIAGNOSIS — Z951 Presence of aortocoronary bypass graft: Secondary | ICD-10-CM

## 2020-07-12 LAB — LIPID PANEL
Chol/HDL Ratio: 6.2 ratio — ABNORMAL HIGH (ref 0.0–5.0)
Cholesterol, Total: 247 mg/dL — ABNORMAL HIGH (ref 100–199)
HDL: 40 mg/dL (ref >39)
LDL Chol Calc (NIH): 126 mg/dL — ABNORMAL HIGH (ref 0–99)
Triglycerides: 453 mg/dL — ABNORMAL HIGH (ref 0–149)
VLDL Cholesterol Cal: 81 mg/dL — ABNORMAL HIGH (ref 5–40)

## 2020-07-12 LAB — HEPATIC FUNCTION PANEL
ALT: 17 IU/L (ref 0–44)
AST: 22 IU/L (ref 0–40)
Albumin: 4.3 g/dL (ref 3.7–4.7)
Alkaline Phosphatase: 96 IU/L (ref 44–121)
Bilirubin Total: 0.5 mg/dL (ref 0.0–1.2)
Bilirubin, Direct: 0.12 mg/dL (ref 0.00–0.40)
Total Protein: 6.7 g/dL (ref 6.0–8.5)

## 2020-07-12 NOTE — Assessment & Plan Note (Signed)
History of CAD status post cardiac catheterization by myself 02/27/2018 revealing significant two-vessel disease.  He ultimately underwent CABG x4 by Dr. Tyrone Sage on 04/01/2018 with a LIMA to his LAD, vein to ramus branch, obtuse marginal branch and PLA.  His postop course was prolonged.  He denies chest pain or shortness of breath.

## 2020-07-12 NOTE — Assessment & Plan Note (Signed)
History of hyperlipidemia not on statin therapy.  We will recheck a lipid liver profile this morning.

## 2020-07-12 NOTE — Patient Instructions (Signed)
Medication Instructions:  Your physician recommends that you continue on your current medications as directed. Please refer to the Current Medication list given to you today.  *If you need a refill on your cardiac medications before your next appointment, please call your pharmacy*   Lab Work: Your physician recommends that you have labs drawn today: lipid/liver profile  If you have labs (blood work) drawn today and your tests are completely normal, you will receive your results only by: Marland Kitchen MyChart Message (if you have MyChart) OR . A paper copy in the mail If you have any lab test that is abnormal or we need to change your treatment, we will call you to review the results.  Follow-Up: At [facility], you and your health needs are our priority.  As part of our continuing mission to provide you with exceptional heart care, we have created designated Provider Care Teams.  These Care Teams include your primary Cardiologist (physician) and Advanced Practice Providers (APPs -  Physician Assistants and Nurse Practitioners) who all work together to provide you with the care you need, when you need it.  We recommend signing up for the patient portal called "MyChart".  Sign up information is provided on this After Visit Summary.  MyChart is used to connect with patients for Virtual Visits (Telemedicine).  Patients are able to view lab/test results, encounter notes, upcoming appointments, etc.  Non-urgent messages can be sent to your provider as well.   To learn more about what you can do with MyChart, go to [email or web address].    Your next appointment:   6 month(s)  The format for your next appointment:   In Person  Provider:   You will see one of the following Advanced Practice Providers on your designated Care Team:    Marjie Skiff, PA-C  Then, Nanetta Batty, MD will plan to see you again in 12 month(s).

## 2020-07-12 NOTE — Progress Notes (Signed)
07/12/2020 [name]   November 22, 1942  [id number]  Primary Physician Karle Plumber, MD Primary Cardiologist: Runell Gess MD Nicholes Calamity, [place]  HPI:  [name] is a 75 y.o.  moderately overweight widowed Caucasian male father of 2, grandfather of 6 granddaughters referred by Dr. Hanley Hays for cardiac catheterization because of new onset exertional chest pain and an abnormal Myoview stress test.I last saw him in the office 05/20/2019.His risk factors include treated hypertension, diabetes and hyperlipidemia. He is never had a heart attack or stroke. There is no family history of heart disease. 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 He smoked remotely and stopped in 1982. He is fairly active and golfs several times a week. He gets chest pain after the first hole. Recent 2D echo was essentially normal and a Myoview stress test did show inferior nontransmural scar with moderate lateral ischemic changes.   He underwent outpatient radial diagnostic cath by myself on 02/27/2018 revealing significant two-vessel disease and a left dominant system in the LAD and circumflex. I referred him to Dr. Maren Beach who ultimately performed CABG x4 on 04/01/2018 with a LIMA to the LAD, vein to a ramus branch, obtuse marginal branch and PLA. Was delayed 2 weeks because of dental issue that needed to be addressed. His postop course was complicated. He was in the ICU for 11 days then transferred to the floor and ultimately to rehab. He was readmitted to the hospital with a liver abscess which underwent percutaneous drainage.  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  He has had abdominal surgery with Dr. Donell Beers and currently has a liver drainage tube and is on antibiotics followed by Dr. Orvan Falconer.  He was on Eliquis and amiodarone in the past because of a flutter but his most recent EKG showed sinus  rhythm.  Since I saw him 6 months ago he did see Marjie Skiff in the office 01/13/2020 for preoperative clearance before ventral hernia repair which was subsequently performed successfully by Dr. Margaree Mackintosh.  He is working out at [employer] several days a week on the treadmill for 40 minutes at a time and playing golf as well.  He also has obstructive sleep apnea on CPAP.   Current Meds  Medication Sig  . amLODipine (NORVASC) 10 MG tablet Take 10 mg by mouth daily.  Marland Kitchen aspirin EC 81 MG tablet Take 81 mg by mouth every evening.   . cholecalciferol (VITAMIN D) 25 MCG (1000 UT) tablet Take 1,000 Units by mouth in the morning and at bedtime.   . Cyanocobalamin (B-12) 5000 MCG CAPS Take 5,000 mcg by mouth daily.  . finasteride (PROSCAR) 5 MG tablet Take 5 mg by mouth daily.  Marland Kitchen loratadine (CLARITIN) 10 MG tablet Take 10 mg by mouth daily.  . methocarbamol (ROBAXIN) 500 MG tablet Take 1 tablet (500 mg total) by mouth every 6 (six) hours as needed for muscle spasms.  . metoprolol tartrate (LOPRESSOR) 50 MG tablet Take 50 mg by mouth in the morning and at bedtime.  . Multiple Vitamins-Minerals (MULTIVITAMIN ADULT PO) Take 1 tablet by mouth daily.  . [DISCONTINUED] cetirizine (ZYRTEC) 10 MG tablet Take 10 mg by mouth daily.  . [DISCONTINUED] metFORMIN (GLUCOPHAGE) 1000 MG tablet Take 1,000 mg by mouth 2 (two) times daily.  . [DISCONTINUED] metFORMIN (GLUCOPHAGE-XR) 500 MG 24 hr tablet Take 500 mg by mouth 2 (two) times daily.  . [  DISCONTINUED] metoprolol tartrate (LOPRESSOR) 25 MG tablet TAKE ONE TABLET BY MOUTH TWICE DAILY (Patient taking differently: Take 25 mg by mouth 2 (two) times daily.)  . [DISCONTINUED] oxyCODONE (OXY IR/ROXICODONE) 5 MG immediate release tablet Take 1 tablet (5 mg total) by mouth every 6 (six) hours as needed for moderate pain.     Allergies  Allergen Reactions  . Flagyl [Metronidazole] Nausea And Vomiting  . Ciprofloxacin Rash  . Erythromycin Ethylsuccinate Rash    Social  History   Socioeconomic History  . Marital status: Widowed    Spouse name: Not on file  . Number of children: 2  . Years of education: Not on file  . Highest education level: Not on file  Occupational History  . Occupation: retired  Tobacco Use  . Smoking status: Former Smoker    Types: Cigarettes    Quit date: 1982    Years since quitting: 40.2  . Smokeless tobacco: Former Neurosurgeon  . Tobacco comment: quit 1982  Vaping Use  . Vaping Use: Never used  Substance and Sexual Activity  . Alcohol use: Yes    Comment: very seldomly   . Drug use: Never  . Sexual activity: Not on file  Other Topics Concern  . 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   Social Determinants of Health   Financial Resource Strain: Not on file  Food Insecurity: Not on file  Transportation Needs: Not on file  Physical Activity: Not on file  Stress: Not on file  Social Connections: Not on file  Intimate Partner Violence: Not on file     Review of Systems: General: negative for chills, fever, night sweats or weight changes.  Cardiovascular: negative for chest pain, dyspnea on exertion, edema, orthopnea, palpitations, paroxysmal nocturnal dyspnea or shortness of breath Dermatological: negative for rash Respiratory: negative for cough or wheezing Urologic: negative for hematuria Abdominal: negative for nausea, vomiting, diarrhea, bright red blood per rectum, melena, or hematemesis Neurologic: negative for visual changes, syncope, or dizziness All other systems reviewed and are otherwise negative except as noted above.    Blood pressure 140/78, pulse (!) 54, height 5\' 11"  (1.803 m), weight 259 lb (117.5 kg).    EKG sinus bradycardia 54 with right bundle branch block.  I personally reviewed this EKG.  ASSESSMENT AND PLAN:   Essential hypertension History of essential hypertension a blood pressure measured today 140/78.  He is on amlodipine and  metoprolol.  Hyperlipidemia History of hyperlipidemia not on statin therapy.  We will recheck a lipid liver profile this morning.  Hx of CABG History of CAD status post cardiac catheterization by myself 02/27/2018 revealing significant two-vessel disease.  He ultimately underwent CABG x4 by Dr. 03/01/2018 on 04/01/2018 with a LIMA to his LAD, vein to ramus branch, obtuse marginal branch and PLA.  His postop course was prolonged.  He denies chest pain or shortness of breath.  Obstructive sleep apnea History of obstructive sleep apnea on CPAP.      14/06/2017 MD FACP,FACC,FAHA, [facility] 07/12/2020 9:27 AM

## 2020-07-12 NOTE — Assessment & Plan Note (Signed)
History of obstructive sleep apnea on CPAP. 

## 2020-07-12 NOTE — Assessment & Plan Note (Signed)
History of essential hypertension a blood pressure measured today 140/78.  He is on amlodipine and metoprolol.

## 2020-07-22 ENCOUNTER — Encounter

## 2020-07-22 ENCOUNTER — Telehealth: Payer: Self-pay

## 2020-07-22 NOTE — Telephone Encounter (Signed)
-----   Message from Runell Gess, MD sent at 07/12/2020  6:11 PM EDT ----- LDL elevated and not at goal for secondary prevention. Begin Atorva 40 mg/day and re check in 2 months

## 2020-07-22 NOTE — Telephone Encounter (Signed)
Left message for pt to call back regarding recent lab work. 

## 2020-08-10 ENCOUNTER — Encounter

## 2020-08-10 ENCOUNTER — Emergency Department (HOSPITAL_COMMUNITY): Payer: Medicare HMO

## 2020-08-10 ENCOUNTER — Encounter (HOSPITAL_COMMUNITY): Payer: Self-pay | Admitting: Emergency Medicine

## 2020-08-10 ENCOUNTER — Inpatient Hospital Stay (HOSPITAL_COMMUNITY)
Admission: EM | Admit: 2020-08-10 | Discharge: 2020-08-12 | DRG: 444 | Disposition: A | Discharge status: Home / Self Care | Payer: Medicare HMO | Attending: Internal Medicine | Admitting: Internal Medicine

## 2020-08-10 DIAGNOSIS — Z20822 Contact with and (suspected) exposure to covid-19: Secondary | ICD-10-CM | POA: Diagnosis present

## 2020-08-10 DIAGNOSIS — K3189 Other diseases of stomach and duodenum: Secondary | ICD-10-CM

## 2020-08-10 DIAGNOSIS — K219 Gastro-esophageal reflux disease without esophagitis: Secondary | ICD-10-CM | POA: Diagnosis present

## 2020-08-10 DIAGNOSIS — Z7982 Long term (current) use of aspirin: Secondary | ICD-10-CM

## 2020-08-10 DIAGNOSIS — Z9049 Acquired absence of other specified parts of digestive tract: Secondary | ICD-10-CM

## 2020-08-10 DIAGNOSIS — E872 Acidosis: Secondary | ICD-10-CM | POA: Diagnosis present

## 2020-08-10 DIAGNOSIS — E119 Type 2 diabetes mellitus without complications: Secondary | ICD-10-CM

## 2020-08-10 DIAGNOSIS — Z888 Allergy status to other drugs, medicaments and biological substances status: Secondary | ICD-10-CM

## 2020-08-10 DIAGNOSIS — E1142 Type 2 diabetes mellitus with diabetic polyneuropathy: Secondary | ICD-10-CM | POA: Diagnosis present

## 2020-08-10 DIAGNOSIS — M109 Gout, unspecified: Secondary | ICD-10-CM | POA: Diagnosis present

## 2020-08-10 DIAGNOSIS — R1084 Generalized abdominal pain: Secondary | ICD-10-CM

## 2020-08-10 DIAGNOSIS — Z6838 Body mass index (BMI) 38.0-38.9, adult: Secondary | ICD-10-CM

## 2020-08-10 DIAGNOSIS — E669 Obesity, unspecified: Secondary | ICD-10-CM | POA: Diagnosis present

## 2020-08-10 DIAGNOSIS — I10 Essential (primary) hypertension: Secondary | ICD-10-CM | POA: Diagnosis present

## 2020-08-10 DIAGNOSIS — Z713 Dietary counseling and surveillance: Secondary | ICD-10-CM

## 2020-08-10 DIAGNOSIS — N179 Acute kidney failure, unspecified: Secondary | ICD-10-CM | POA: Diagnosis present

## 2020-08-10 DIAGNOSIS — I4892 Unspecified atrial flutter: Secondary | ICD-10-CM | POA: Diagnosis present

## 2020-08-10 DIAGNOSIS — K8051 Calculus of bile duct without cholangitis or cholecystitis with obstruction: Secondary | ICD-10-CM | POA: Diagnosis not present

## 2020-08-10 DIAGNOSIS — F419 Anxiety disorder, unspecified: Secondary | ICD-10-CM | POA: Diagnosis present

## 2020-08-10 DIAGNOSIS — Z951 Presence of aortocoronary bypass graft: Secondary | ICD-10-CM

## 2020-08-10 DIAGNOSIS — Z79899 Other long term (current) drug therapy: Secondary | ICD-10-CM

## 2020-08-10 DIAGNOSIS — K831 Obstruction of bile duct: Secondary | ICD-10-CM

## 2020-08-10 DIAGNOSIS — Z881 Allergy status to other antibiotic agents status: Secondary | ICD-10-CM

## 2020-08-10 DIAGNOSIS — E785 Hyperlipidemia, unspecified: Secondary | ICD-10-CM | POA: Diagnosis present

## 2020-08-10 DIAGNOSIS — K8050 Calculus of bile duct without cholangitis or cholecystitis without obstruction: Secondary | ICD-10-CM | POA: Diagnosis not present

## 2020-08-10 DIAGNOSIS — G4733 Obstructive sleep apnea (adult) (pediatric): Secondary | ICD-10-CM | POA: Diagnosis present

## 2020-08-10 DIAGNOSIS — K319 Disease of stomach and duodenum, unspecified: Secondary | ICD-10-CM | POA: Diagnosis present

## 2020-08-10 DIAGNOSIS — K851 Biliary acute pancreatitis without necrosis or infection: Secondary | ICD-10-CM | POA: Diagnosis present

## 2020-08-10 DIAGNOSIS — Z86010 Personal history of colonic polyps: Secondary | ICD-10-CM

## 2020-08-10 DIAGNOSIS — Z87891 Personal history of nicotine dependence: Secondary | ICD-10-CM

## 2020-08-10 DIAGNOSIS — I2510 Atherosclerotic heart disease of native coronary artery without angina pectoris: Secondary | ICD-10-CM | POA: Diagnosis present

## 2020-08-10 DIAGNOSIS — R7989 Other specified abnormal findings of blood chemistry: Secondary | ICD-10-CM

## 2020-08-10 DIAGNOSIS — K760 Fatty (change of) liver, not elsewhere classified: Secondary | ICD-10-CM | POA: Diagnosis present

## 2020-08-10 LAB — CBC
HCT: 40.4 % (ref 39.0–52.0)
Hemoglobin: 13.6 g/dL (ref 13.0–17.0)
MCH: 30.4 pg (ref 26.0–34.0)
MCHC: 33.7 g/dL (ref 30.0–36.0)
MCV: 90.4 fL (ref 80.0–100.0)
Platelets: 255 K/uL (ref 150–400)
RBC: 4.47 MIL/uL (ref 4.22–5.81)
RDW: 13.7 % (ref 11.5–15.5)
WBC: 14 K/uL — ABNORMAL HIGH (ref 4.0–10.5)
nRBC: 0 % (ref 0.0–0.2)

## 2020-08-10 LAB — URINALYSIS, ROUTINE W REFLEX MICROSCOPIC
Bilirubin Urine: NEGATIVE
Glucose, UA: NEGATIVE mg/dL
Hgb urine dipstick: NEGATIVE
Ketones, ur: NEGATIVE mg/dL
Leukocytes,Ua: NEGATIVE
Nitrite: NEGATIVE
Protein, ur: NEGATIVE mg/dL
Specific Gravity, Urine: 1.011 (ref 1.005–1.030)
pH: 5 (ref 5.0–8.0)

## 2020-08-10 LAB — TROPONIN I (HIGH SENSITIVITY): Troponin I (High Sensitivity): 14 ng/L (ref <18)

## 2020-08-10 LAB — LACTIC ACID, PLASMA: Lactic Acid, Venous: 2.3 mmol/L (ref 0.5–1.9)

## 2020-08-10 MED ORDER — HYDROMORPHONE HCL 1 MG/ML IJ SOLN
1.0000 mg | Freq: Once | INTRAMUSCULAR | Status: AC
Start: 2020-08-10 — End: 2020-08-10
  Administered 2020-08-10: 1 mg via INTRAVENOUS
  Filled 2020-08-10: qty 1

## 2020-08-10 MED ORDER — ONDANSETRON HCL 4 MG/2ML IJ SOLN
4.0000 mg | Freq: Once | INTRAMUSCULAR | Status: DC
Start: 2020-08-10 — End: 2020-08-10

## 2020-08-10 MED ORDER — ONDANSETRON HCL 4 MG/2ML IJ SOLN
4.0000 mg | Freq: Once | INTRAMUSCULAR | Status: AC
Start: 2020-08-10 — End: 2020-08-10
  Administered 2020-08-10: 4 mg via INTRAVENOUS
  Filled 2020-08-10: qty 2

## 2020-08-10 MED ORDER — HYDROMORPHONE HCL 1 MG/ML IJ SOLN
0.5000 mg | Freq: Once | INTRAMUSCULAR | Status: DC
Start: 2020-08-10 — End: 2020-08-10

## 2020-08-10 NOTE — ED Notes (Signed)
Patient transported to X-ray 

## 2020-08-10 NOTE — ED Triage Notes (Signed)
Patient reports mid abdominal pain this morning , no emesis or diarrhea , denies fever or chills . Last BM today .

## 2020-08-10 NOTE — ED Provider Notes (Signed)
[facility] Provider Note   CSN: [id number] Arrival date & time: 08/10/20  1900     History Chief Complaint  Patient presents with  . Abdominal Pain    Calvin FantasiaLarry F Price is a 75 y.o. male.  Past medical history of CAD, DM 2, HTN, HLD, anemia, anxiety, allergies, AKI  HPI Patient is a 75 year old male with past medical history significant for HTN, HLD, CABG, CAD, a flutter, anemia, DM 2, diverticulitis, intra-abdominal postoperative abscess formation, duodenal fistula, hernia s/p repair, cholecystectomy, multiple hernia repairs  Patient is presented today with diffuse abdominal pain that is severe, 11/10, constant, achy, occasionally sharp and stabbing.  He states that his constant worse with movement.  States he did have some nausea this morning but denies any emesis, diarrhea, fevers or chills.  He states he had a normal soft brown stool BM this morning.  Denies any urinary frequency urgency or dysuria.  He denies any chest pain but states that his pain seems to radiate up into his epigastrium as well as his abdomen.   Patient denies any shortness of breath.  No history of DVT, PE.  No recent surgeries, hospitalization, long travel, hemoptysis, estrogen containing OCP, cancer history.  No unilateral leg swelling.  No history of PE or VTE.      Past Medical History:  Diagnosis Date  . Allergies   . Anemia   . Anxiety   . CAD (coronary artery disease)   . Diabetes (HCC)   . Dyspnea    upon exertion  . Gout   . Hepatitis    at 75 years old  . Hyperlipemia   . Hypertension   . Peripheral neuropathy    per patient "in feet"  . Sinus headache   . Sleep apnea    per patient wears CPAP just about every night  . Ventral incisional hernia     Patient Active Problem List   Diagnosis Date Noted  . Obstructive sleep apnea 07/12/2020  . Ventral incisional hernia 03/08/2020  . 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 AKI (acute kidney injury) (HCC)  11/01/2018  . RBBB 11/01/2018  . Abscess, intra-abdominal, postoperative 10/31/2018  . Protein-calorie malnutrition, severe 05/10/2018  . Acute diverticulitis 05/09/2018  . Atrial flutter (HCC) 05/01/2018  . Anemia 05/01/2018  . Non-insulin dependent type 2 diabetes mellitus (HCC) 05/01/2018  . Coronary artery disease 03/31/2018  . Hx of CABG   . Essential hypertension 02/25/2018  . Hyperlipidemia 02/25/2018    Past Surgical History:  Procedure Laterality Date  . CHOLECYSTECTOMY N/A 10/07/2018   Procedure: Laparoscopic Cholecystectomy;  Surgeon: Almond LintByerly, Faera, MD;  Location: [facility] OR;  Service: General;  Laterality: N/A;  . CORONARY ARTERY BYPASS GRAFT N/A 03/31/2018   Procedure: CORONARY ARTERY BYPASS GRAFTING (CABG) TIMES FOUR USING LEFT INTERNAL MAMMARY ARTERY AND RIGHT GREATER SAPHENOUS VEIN HARVESTED ENDOSCOPICALLY.;  Surgeon: Kerin PernaVan Trigt, Peter, MD;  [facility];  Service: Open Heart Surgery;  Laterality: N/A;  . DUODENAL FISTULA REPAIR  04/07/2019   REPAIR OF DUODENAL FISTULA (N/A Abdomen)  . INCISIONAL HERNIA REPAIR  03/08/2020  . INSERTION OF MESH N/A 03/08/2020   Procedure: INSERTION OF MESH;  Surgeon: Manus Ruddsuei, Matthew, MD;  [facility];  Service: General;  Laterality: N/A;  . IR CATHETER TUBE CHANGE  12/09/2018  . IR CATHETER TUBE CHANGE  02/06/2019  . IR EXCHANGE BILIARY DRAIN  06/27/2018  . IR PERC CHOLECYSTOSTOMY  05/11/2018  . IR RADIOLOGIST EVAL & MGMT  06/11/2018  .  IR RADIOLOGIST EVAL & MGMT  11/18/2018  . IR RADIOLOGIST EVAL & MGMT  12/03/2018  . IR RADIOLOGIST EVAL & MGMT  05/21/2019  . LEFT HEART CATH AND CORONARY ANGIOGRAPHY N/A 02/27/2018   Procedure: LEFT HEART CATH AND CORONARY ANGIOGRAPHY;  Surgeon: Runell Gess, MD;  Location: [facility] INVASIVE CV LAB;  Service: Cardiovascular;  Laterality: N/A;  . liver absess  2010  . TEE WITHOUT CARDIOVERSION N/A 03/31/2018   Procedure: TRANSESOPHAGEAL ECHOCARDIOGRAM (TEE);  Surgeon: Donata Clay, Theron Arista, MD;  Location: [facility] OR;   Service: Open Heart Surgery;  Laterality: N/A;  . TONSILLECTOMY     per patient  . VENTRAL HERNIA REPAIR N/A 03/08/2020   Procedure: OPEN VENTRAL HERNIA REPAIR WITH MESH;  Surgeon: Manus Rudd, MD;  Location: [facility] OR;  Service: General;  Laterality: N/A;       Family History  Problem Relation Age of Onset  . Atrial fibrillation Mother   . Stroke Mother   . Cancer Maternal Aunt     Social History   Tobacco Use  . Smoking status: Former Smoker    Types: Cigarettes    Quit date: 1982    Years since quitting: 40.3  . Smokeless tobacco: Former Neurosurgeon  . Tobacco comment: quit 1982  Vaping Use  . Vaping Use: Never used  Substance Use Topics  . Alcohol use: Yes    Comment: very seldomly   . Drug use: Never    Home Medications Prior to Admission medications   Medication Sig Start Date End Date Taking? Authorizing Provider  amLODipine (NORVASC) 10 MG tablet Take 10 mg by mouth daily. 02/03/20  Yes [provider]  aspirin EC 81 MG tablet Take 81 mg by mouth every evening.    Yes [provider]  cholecalciferol (VITAMIN D) 25 MCG (1000 UT) tablet Take 1,000 Units by mouth in the morning and at bedtime.    Yes [provider]  Cyanocobalamin (B-12) 1000 MCG TABS Take 1,000 mcg by mouth at bedtime.   Yes [provider]  esomeprazole (NEXIUM) 40 MG capsule Take 40 mg by mouth daily. 08/08/20  Yes [provider]  finasteride (PROSCAR) 5 MG tablet Take 5 mg by mouth daily. 02/05/18  Yes [provider]  loratadine (CLARITIN) 10 MG tablet Take 10 mg by mouth daily.   Yes [provider]  losartan (COZAAR) 50 MG tablet Take 50 mg by mouth daily. 08/08/20  Yes [provider]  metoprolol tartrate (LOPRESSOR) 50 MG tablet Take 50 mg by mouth in the morning and at bedtime. 02/03/20  Yes [provider]  Multiple Vitamins-Minerals (MULTIVITAMIN ADULT PO) Take 1 tablet by mouth daily.   Yes [provider]   rosuvastatin (CRESTOR) 10 MG tablet Take 10 mg by mouth at bedtime. 08/08/20  Yes [provider]  methocarbamol (ROBAXIN) 500 MG tablet Take 1 tablet (500 mg total) by mouth every 6 (six) hours as needed for muscle spasms. Patient not taking: Reported on 08/11/2020 03/09/20   Manus Rudd, MD    Allergies    Flagyl [metronidazole], Ciprofloxacin, and Erythromycin ethylsuccinate  Review of Systems   Review of Systems  Constitutional: Negative for chills and fever.  HENT: Negative for congestion.   Eyes: Negative for pain.  Respiratory: Negative for cough and shortness of breath.   Cardiovascular: Negative for chest pain and leg swelling.  Gastrointestinal: Positive for abdominal pain and nausea. Negative for diarrhea and vomiting.  Genitourinary: Negative for dysuria.  Musculoskeletal: Negative for  myalgias.  Skin: Negative for rash.  Neurological: Negative for dizziness and headaches.    Physical Exam Updated Vital Signs BP 106/60   Pulse (!) 105   Temp 98.4 F (36.9 C) (Oral)   Resp 16   Ht 5\' 11"  (1.803 m)   Wt 125 kg   SpO2 95%   BMI 38.43 kg/m   Physical Exam Vitals and nursing note reviewed.  Constitutional:      General: He is in acute distress.     Appearance: He is obese.  HENT:     Head: Normocephalic and atraumatic.     Nose: Nose normal.  Eyes:     General: No scleral icterus. Cardiovascular:     Rate and Rhythm: Normal rate and regular rhythm.     Pulses: Normal pulses.     Heart sounds: Normal heart sounds.  Pulmonary:     Effort: Pulmonary effort is normal. No respiratory distress.     Breath sounds: No wheezing.  Abdominal:     Palpations: Abdomen is soft.     Tenderness: There is abdominal tenderness.     Comments: Abdomen is soft and significantly tender in all quadrants.  No guarding or rebound.  No CVA tenderness.  Musculoskeletal:     Cervical back: Normal range of motion.     Right lower leg: No edema.     Left lower leg: No  edema.  Skin:    General: Skin is warm and dry.     Capillary Refill: Capillary refill takes less than 2 seconds.  Neurological:     Mental Status: He is alert. Mental status is at baseline.  Psychiatric:        Mood and Affect: Mood normal.        Behavior: Behavior normal.     ED Results / Procedures / Treatments   Labs (all labs ordered are listed, but only abnormal results are displayed) Labs Reviewed  CBC - Abnormal; Notable for the following components:      Result Value   WBC 14.0 (*)    All other components within normal limits  LACTIC ACID, PLASMA - Abnormal; Notable for the following components:   Lactic Acid, Venous 2.3 (*)    All other components within normal limits  RESP PANEL BY RT-PCR (FLU A&B, COVID) ARPGX2  URINALYSIS, ROUTINE W REFLEX MICROSCOPIC  LACTIC ACID, PLASMA  TROPONIN I (HIGH SENSITIVITY)  TROPONIN I (HIGH SENSITIVITY)    EKG EKG Interpretation  Date/Time:  Wednesday August 10 2020 19:21:43 EDT Ventricular Rate:  74 PR Interval:    QRS Duration: 136 QT Interval:  422 QTC Calculation: 468 R Axis:   -89 Text Interpretation: Wide QRS rhythm Left axis deviation Right bundle branch block Possible Lateral infarct , age undetermined Inferior infarct , age undetermined Abnormal ECG No significant change since last tracing Confirmed by 11-13-2001 [phone]) on 08/10/2020 10:40:03 PM   Radiology DG Abdomen Acute W/Chest  Result Date: 08/10/2020 CLINICAL DATA:  Chest and abdominal pain EXAM: DG ABDOMEN ACUTE WITH 1 VIEW CHEST COMPARISON:  11/08/2018 FINDINGS: Lungs are clear. No pneumothorax or pleural effusion. Coronary artery bypass grafting has been performed. Cardiac size within normal limits. Pulmonary vascularity is normal. Gas-filled loops of a dilated small bowel is seen within the epigastrium. Gas is seen within a nondistended colon. Together, the findings may reflect changes of a partial small bowel obstruction or represent a sentinel loop secondary  to a a focal inflammatory process within the epigastrium. No free  intraperitoneal gas. No organomegaly. No abnormal calcifications within the abdomen and pelvis. IMPRESSION: No radiographic evidence of acute cardiopulmonary disease. Dilated gas-filled loop of small bowel within the epigastrium representative of either a proximal partial small bowel obstruction or a sentinel loop related to an adjacent inflammatory process. Electronically Signed   By: Helyn Numbers MD   On: 08/10/2020 23:16    Procedures Procedures   Medications Ordered in ED Medications  HYDROmorphone (DILAUDID) injection 1 mg (1 mg Intravenous Given 08/10/20 2125)  ondansetron (ZOFRAN) injection 4 mg (4 mg Intravenous Given 08/10/20 2125)    ED Course  I have reviewed the triage vital signs and the nursing notes.  Pertinent labs & imaging results that were available during my care of the patient were reviewed by me and considered in my medical decision making (see chart for details).    MDM Rules/Calculators/A&P                          Patient is 75 year old male extensive past surgical history.  Presented today with abdominal pain, nausea without vomiting.  Physical exam is notable for diffuse abdominal tenderness  Patient with leukocytosis of 14.  No anemia.  Troponin within normal is x1.  Lactic acidosis of 2.3 which is mild.  Patient is mildly tachycardic will provide with 1 L LR given over 2 hours   Given delay for CT imaging will obtain DG abdomen chest acute abdominal series.  This shows evidence of air-fluid levels further increasing concern for SBO.  Given his patient's history of a flutter and he is somewhat out of proportion pain on exam as well as elevated lactic will obtain CT angio abdomen pelvis. Urinalysis unremarkable.  No evidence of infection.  Patient was evaluated by Dr. Oletta Cohn.  Patient care transferred to [facility] for follow up on CT imaging.  Anticipate admission for SBO.   Final  Clinical Impression(s) / ED Diagnoses Final diagnoses:  Generalized abdominal pain    Rx / DC Orders ED Discharge Orders    None       Gailen Shelter, [place] 08/11/20 0036    Melene Plan, DO 08/11/20 1509

## 2020-08-11 ENCOUNTER — Encounter (HOSPITAL_COMMUNITY): Payer: Self-pay | Admitting: Internal Medicine

## 2020-08-11 ENCOUNTER — Encounter

## 2020-08-11 ENCOUNTER — Inpatient Hospital Stay (HOSPITAL_COMMUNITY): Payer: Medicare HMO | Admitting: Certified Registered Nurse Anesthetist

## 2020-08-11 ENCOUNTER — Inpatient Hospital Stay (HOSPITAL_COMMUNITY): Payer: Medicare HMO

## 2020-08-11 ENCOUNTER — Encounter (HOSPITAL_COMMUNITY)
Admission: EM | Disposition: A | Discharge status: Home / Self Care | Payer: Self-pay | Source: Home / Self Care | Attending: Internal Medicine

## 2020-08-11 ENCOUNTER — Emergency Department (HOSPITAL_COMMUNITY): Payer: Medicare HMO

## 2020-08-11 DIAGNOSIS — K3189 Other diseases of stomach and duodenum: Secondary | ICD-10-CM

## 2020-08-11 DIAGNOSIS — Z87891 Personal history of nicotine dependence: Secondary | ICD-10-CM | POA: Diagnosis not present

## 2020-08-11 DIAGNOSIS — R7989 Other specified abnormal findings of blood chemistry: Secondary | ICD-10-CM

## 2020-08-11 DIAGNOSIS — K831 Obstruction of bile duct: Secondary | ICD-10-CM

## 2020-08-11 DIAGNOSIS — K8050 Calculus of bile duct without cholangitis or cholecystitis without obstruction: Secondary | ICD-10-CM | POA: Diagnosis present

## 2020-08-11 DIAGNOSIS — E872 Acidosis: Secondary | ICD-10-CM | POA: Diagnosis present

## 2020-08-11 DIAGNOSIS — K851 Biliary acute pancreatitis without necrosis or infection: Secondary | ICD-10-CM

## 2020-08-11 DIAGNOSIS — Z9049 Acquired absence of other specified parts of digestive tract: Secondary | ICD-10-CM | POA: Diagnosis not present

## 2020-08-11 DIAGNOSIS — Z881 Allergy status to other antibiotic agents status: Secondary | ICD-10-CM | POA: Diagnosis not present

## 2020-08-11 DIAGNOSIS — K8051 Calculus of bile duct without cholangitis or cholecystitis with obstruction: Principal | ICD-10-CM

## 2020-08-11 DIAGNOSIS — G4733 Obstructive sleep apnea (adult) (pediatric): Secondary | ICD-10-CM | POA: Diagnosis present

## 2020-08-11 DIAGNOSIS — K760 Fatty (change of) liver, not elsewhere classified: Secondary | ICD-10-CM | POA: Diagnosis present

## 2020-08-11 DIAGNOSIS — M109 Gout, unspecified: Secondary | ICD-10-CM | POA: Diagnosis present

## 2020-08-11 DIAGNOSIS — Z7982 Long term (current) use of aspirin: Secondary | ICD-10-CM | POA: Diagnosis not present

## 2020-08-11 DIAGNOSIS — I4892 Unspecified atrial flutter: Secondary | ICD-10-CM

## 2020-08-11 DIAGNOSIS — Z79899 Other long term (current) drug therapy: Secondary | ICD-10-CM | POA: Diagnosis not present

## 2020-08-11 DIAGNOSIS — I10 Essential (primary) hypertension: Secondary | ICD-10-CM

## 2020-08-11 DIAGNOSIS — Z951 Presence of aortocoronary bypass graft: Secondary | ICD-10-CM

## 2020-08-11 DIAGNOSIS — Z888 Allergy status to other drugs, medicaments and biological substances status: Secondary | ICD-10-CM | POA: Diagnosis not present

## 2020-08-11 DIAGNOSIS — E669 Obesity, unspecified: Secondary | ICD-10-CM | POA: Diagnosis present

## 2020-08-11 DIAGNOSIS — E1142 Type 2 diabetes mellitus with diabetic polyneuropathy: Secondary | ICD-10-CM | POA: Diagnosis present

## 2020-08-11 DIAGNOSIS — R1084 Generalized abdominal pain: Secondary | ICD-10-CM

## 2020-08-11 DIAGNOSIS — Z20822 Contact with and (suspected) exposure to covid-19: Secondary | ICD-10-CM | POA: Diagnosis present

## 2020-08-11 DIAGNOSIS — E119 Type 2 diabetes mellitus without complications: Secondary | ICD-10-CM

## 2020-08-11 DIAGNOSIS — I2510 Atherosclerotic heart disease of native coronary artery without angina pectoris: Secondary | ICD-10-CM | POA: Diagnosis present

## 2020-08-11 DIAGNOSIS — N179 Acute kidney failure, unspecified: Secondary | ICD-10-CM | POA: Diagnosis present

## 2020-08-11 DIAGNOSIS — F419 Anxiety disorder, unspecified: Secondary | ICD-10-CM | POA: Diagnosis present

## 2020-08-11 DIAGNOSIS — K219 Gastro-esophageal reflux disease without esophagitis: Secondary | ICD-10-CM | POA: Diagnosis present

## 2020-08-11 DIAGNOSIS — E785 Hyperlipidemia, unspecified: Secondary | ICD-10-CM | POA: Diagnosis present

## 2020-08-11 DIAGNOSIS — K319 Disease of stomach and duodenum, unspecified: Secondary | ICD-10-CM | POA: Diagnosis present

## 2020-08-11 HISTORY — PX: BIOPSY: SHX5522

## 2020-08-11 HISTORY — PX: REMOVAL OF STONES: SHX5545

## 2020-08-11 HISTORY — PX: ENDOSCOPIC RETROGRADE CHOLANGIOPANCREATOGRAPHY (ERCP) WITH PROPOFOL: SHX5810

## 2020-08-11 HISTORY — PX: SPHINCTEROTOMY: SHX5544

## 2020-08-11 LAB — HEMOGLOBIN A1C
Hgb A1c MFr Bld: 6.5 % — ABNORMAL HIGH (ref 4.8–5.6)
Mean Plasma Glucose: 139.85 mg/dL

## 2020-08-11 LAB — COMPREHENSIVE METABOLIC PANEL
ALT: 268 U/L — ABNORMAL HIGH (ref 0–44)
AST: 324 U/L — ABNORMAL HIGH (ref 15–41)
Albumin: 3.7 g/dL (ref 3.5–5.0)
Alkaline Phosphatase: 129 U/L — ABNORMAL HIGH (ref 38–126)
Anion gap: 11 (ref 5–15)
BUN: 18 mg/dL (ref 8–23)
CO2: 22 mmol/L (ref 22–32)
Calcium: 9.1 mg/dL (ref 8.9–10.3)
Chloride: 103 mmol/L (ref 98–111)
Creatinine, Ser: 1.45 mg/dL — ABNORMAL HIGH (ref 0.61–1.24)
GFR, Estimated: 49 mL/min — ABNORMAL LOW (ref >60)
Glucose, Bld: 191 mg/dL — ABNORMAL HIGH (ref 70–99)
Potassium: 3.9 mmol/L (ref 3.5–5.1)
Sodium: 136 mmol/L (ref 135–145)
Total Bilirubin: 3.3 mg/dL — ABNORMAL HIGH (ref 0.3–1.2)
Total Protein: 6.4 g/dL — ABNORMAL LOW (ref 6.5–8.1)

## 2020-08-11 LAB — CBG MONITORING, ED
Glucose-Capillary: 175 mg/dL — ABNORMAL HIGH (ref 70–99)
Glucose-Capillary: 127 mg/dL — ABNORMAL HIGH (ref 70–99)

## 2020-08-11 LAB — GLUCOSE, CAPILLARY
Glucose-Capillary: 136 mg/dL — ABNORMAL HIGH (ref 70–99)
Glucose-Capillary: 132 mg/dL — ABNORMAL HIGH (ref 70–99)
Glucose-Capillary: 219 mg/dL — ABNORMAL HIGH (ref 70–99)

## 2020-08-11 LAB — TROPONIN I (HIGH SENSITIVITY): Troponin I (High Sensitivity): 15 ng/L (ref <18)

## 2020-08-11 LAB — LIPASE, BLOOD: Lipase: 1225 U/L — ABNORMAL HIGH (ref 11–51)

## 2020-08-11 LAB — RESP PANEL BY RT-PCR (FLU A&B, COVID) ARPGX2
Influenza A by PCR: NEGATIVE
Influenza B by PCR: NEGATIVE
SARS Coronavirus 2 by RT PCR: NEGATIVE

## 2020-08-11 LAB — LACTIC ACID, PLASMA: Lactic Acid, Venous: 2.4 mmol/L (ref 0.5–1.9)

## 2020-08-11 SURGERY — ENDOSCOPIC RETROGRADE CHOLANGIOPANCREATOGRAPHY (ERCP) WITH PROPOFOL
Anesthesia: General

## 2020-08-11 MED ORDER — PANTOPRAZOLE SODIUM 40 MG PO TBEC
80.0000 mg | Freq: Every day | ORAL | Status: DC
Start: 2020-08-11 — End: 2020-08-13
  Administered 2020-08-11 – 2020-08-12 (×2): 80 mg via ORAL
  Filled 2020-08-11 (×2): qty 2

## 2020-08-11 MED ORDER — FENTANYL CITRATE (PF) 100 MCG/2ML IJ SOLN
INTRAMUSCULAR | Status: DC | PRN
Start: 2020-08-11 — End: 2020-08-11
  Administered 2020-08-11: 100 ug via INTRAVENOUS

## 2020-08-11 MED ORDER — ONDANSETRON HCL 4 MG/2ML IJ SOLN
4.0000 mg | Freq: Four times a day (QID) | INTRAMUSCULAR | Status: DC | PRN
Start: 2020-08-11 — End: 2020-08-13

## 2020-08-11 MED ORDER — ONDANSETRON HCL 4 MG PO TABS
4.0000 mg | Freq: Four times a day (QID) | ORAL | Status: DC | PRN
Start: 2020-08-11 — End: 2020-08-13

## 2020-08-11 MED ORDER — LACTATED RINGERS IV BOLUS
1000.0000 mL | Freq: Once | INTRAVENOUS | Status: AC
Start: 2020-08-11 — End: 2020-08-11
  Administered 2020-08-11: 1000 mL via INTRAVENOUS

## 2020-08-11 MED ORDER — LACTATED RINGERS IV SOLN
INTRAVENOUS | Status: DC
Start: 2020-08-11 — End: 2020-08-13

## 2020-08-11 MED ORDER — SUCCINYLCHOLINE CHLORIDE 20 MG/ML IJ SOLN
INTRAMUSCULAR | Status: DC | PRN
Start: 2020-08-11 — End: 2020-08-11
  Administered 2020-08-11: 120 mg via INTRAVENOUS

## 2020-08-11 MED ORDER — FENTANYL CITRATE (PF) 100 MCG/2ML IJ SOLN
50.0000 ug | INTRAMUSCULAR | Status: DC | PRN
Start: 2020-08-11 — End: 2020-08-11
  Administered 2020-08-11: 50 ug via INTRAVENOUS
  Filled 2020-08-11: qty 2

## 2020-08-11 MED ORDER — ROSUVASTATIN CALCIUM 5 MG PO TABS
10.0000 mg | Freq: Every day | ORAL | Status: DC
Start: 2020-08-11 — End: 2020-08-13
  Administered 2020-08-11: 10 mg via ORAL
  Filled 2020-08-11: qty 2

## 2020-08-11 MED ORDER — FINASTERIDE 5 MG PO TABS
5.0000 mg | Freq: Every day | ORAL | Status: DC
Start: 2020-08-11 — End: 2020-08-13
  Administered 2020-08-11 – 2020-08-12 (×2): 5 mg via ORAL
  Filled 2020-08-11 (×2): qty 1

## 2020-08-11 MED ORDER — INDOMETHACIN 50 MG RE SUPP
RECTAL | Status: AC
Start: 2020-08-11 — End: ?
  Filled 2020-08-11: qty 2

## 2020-08-11 MED ORDER — PIPERACILLIN-TAZOBACTAM 3.375 G IVPB 30 MIN
3.3750 g | Freq: Once | INTRAVENOUS | Status: AC
Start: 2020-08-11 — End: 2020-08-11
  Administered 2020-08-11: 3.375 g via INTRAVENOUS
  Filled 2020-08-11: qty 50

## 2020-08-11 MED ORDER — PHENYLEPHRINE 40 MCG/ML (10ML) SYRINGE FOR IV PUSH (FOR BLOOD PRESSURE SUPPORT)
INTRAVENOUS | Status: DC | PRN
Start: 2020-08-11 — End: 2020-08-11
  Administered 2020-08-11: 120 ug via INTRAVENOUS
  Administered 2020-08-11: 40 ug via INTRAVENOUS
  Administered 2020-08-11: 80 ug via INTRAVENOUS

## 2020-08-11 MED ORDER — GLUCAGON HCL RDNA (DIAGNOSTIC) 1 MG IJ SOLR
INTRAMUSCULAR | Status: AC
Start: 2020-08-11 — End: ?
  Filled 2020-08-11: qty 1

## 2020-08-11 MED ORDER — IOHEXOL 300 MG/ML  SOLN
100.0000 mL | Freq: Once | INTRAMUSCULAR | Status: AC | PRN
Start: 2020-08-11 — End: 2020-08-11
  Administered 2020-08-11: 100 mL via INTRAVENOUS

## 2020-08-11 MED ORDER — FENTANYL CITRATE (PF) 100 MCG/2ML IJ SOLN
50.0000 ug | INTRAMUSCULAR | Status: DC | PRN
Start: 2020-08-11 — End: 2020-08-13
  Administered 2020-08-11 – 2020-08-12 (×3): 50 ug via INTRAVENOUS
  Filled 2020-08-11 (×4): qty 2

## 2020-08-11 MED ORDER — SODIUM CHLORIDE 0.9 % IV SOLN
INTRAVENOUS | Status: DC | PRN
Start: 2020-08-11 — End: 2020-08-11
  Administered 2020-08-11: 20 mL

## 2020-08-11 MED ORDER — PHENYLEPHRINE HCL (PRESSORS) 10 MG/ML IV SOLN
INTRAVENOUS | Status: DC | PRN
Start: 2020-08-11 — End: 2020-08-11
  Administered 2020-08-11: 20 ug/min via INTRAVENOUS

## 2020-08-11 MED ORDER — PROPOFOL 10 MG/ML IV BOLUS
INTRAVENOUS | Status: DC | PRN
Start: 2020-08-11 — End: 2020-08-11
  Administered 2020-08-11: 120 mg via INTRAVENOUS

## 2020-08-11 MED ORDER — LIDOCAINE 2% (20 MG/ML) 5 ML SYRINGE
INTRAMUSCULAR | Status: DC | PRN
Start: 2020-08-11 — End: 2020-08-11
  Administered 2020-08-11: 80 mg via INTRAVENOUS

## 2020-08-11 MED ORDER — INSULIN ASPART 100 UNIT/ML ~~LOC~~ SOLN
0.0000 [IU] | SUBCUTANEOUS | Status: DC
Start: 2020-08-11 — End: 2020-08-13
  Administered 2020-08-11: 2 [IU] via SUBCUTANEOUS
  Administered 2020-08-11: 3 [IU] via SUBCUTANEOUS
  Administered 2020-08-11 – 2020-08-12 (×3): 1 [IU] via SUBCUTANEOUS
  Administered 2020-08-12 (×2): 2 [IU] via SUBCUTANEOUS

## 2020-08-11 MED ORDER — INDOMETHACIN 50 MG RE SUPP
RECTAL | Status: DC | PRN
Start: 2020-08-11 — End: 2020-08-11
  Administered 2020-08-11: 100 mg via RECTAL

## 2020-08-11 MED ORDER — GLUCAGON HCL RDNA (DIAGNOSTIC) 1 MG IJ SOLR
INTRAMUSCULAR | Status: DC | PRN
Start: 2020-08-11 — End: 2020-08-11
  Administered 2020-08-11: .25 mg via INTRAVENOUS

## 2020-08-11 MED ORDER — PIPERACILLIN-TAZOBACTAM 3.375 G IVPB
3.3750 g | Freq: Three times a day (TID) | INTRAVENOUS | Status: DC
Start: 2020-08-11 — End: 2020-08-13
  Administered 2020-08-11 – 2020-08-12 (×3): 3.375 g via INTRAVENOUS
  Filled 2020-08-11 (×3): qty 50

## 2020-08-11 MED ORDER — LORATADINE 10 MG PO TABS
10.0000 mg | Freq: Every day | ORAL | Status: DC
Start: 2020-08-11 — End: 2020-08-13
  Administered 2020-08-11 – 2020-08-12 (×2): 10 mg via ORAL
  Filled 2020-08-11 (×2): qty 1

## 2020-08-11 NOTE — Progress Notes (Signed)
PROGRESS NOTE    [name]  HEN:[id number] DOB: November 07, 1942 DOA: 08/10/2020 PCP: Karle Plumber, MD   Chief Complaint  Patient presents with  . Abdominal Pain  Brief Narrative: 75 year old male with CAD s/p CABG, diabetes mellitus type 2 hypertension, hyperlipidemia, morbid obesity, OSA status postcholecystectomy in 2020, duodenal fistula repair in late 2020, ventral hernia repair with mesh in November 2021 presented with abdominal pain in the epigastric area, nausea and in the ED found to have abnormal LFTs CT abdomen pelvis showed 6 mm stone in CBD Patient also had lactic acidosis leukocytosis, was given IV fluids antibiotics and admitted  Subjective:  Seen this morning.  Reports with pain medication pain is down to 3, no nausea, no chest pain no shortness of breath.  He was on room air.  Reports he is going for procedure this afternoon.   Assessment & Plan:  Patient seen and examined personally, I reviewed the chart, history and physical and admission note, done by admitting physician this morning and agree with the same with following addendum.  Please refer to the morning admission note for more detailed plan of care.  Briefly,  Choledocholithiasis with obstructive jaundice and biliary pancreatitis: Pancreas normal on the CT.  GI has been consulted-noted plan for ERCP continue IV fluids, Zosyn.  Monitor LFTs lipase.  Leukocytosis/lactic acidosis: Due to #1 continue IV fluids, IV antibiotics.  Hemodynamically stable and afebrile.  Mild AKI continue IV fluids Recent Labs  Lab 08/11/20 0236  BUN 18  CREATININE 1.45*   Essential hypertension:BP is stable.  CAD/Hx of CABG Paroxysmal atrial flutter No chest pain.  Not on anticoagulation-previously on Eliquis and amiodarone per Dr Hazle Coca not in 3/15  Doing HbA1c stable at 6.5.Blood sugar fairly controlled.  Continue current sliding scale insulin.  Morbid obesity will benefit with weight loss.  OSA: continue CPAP at  time  Diet Order            Diet NPO time specified Except for: Sips with Meds  Diet effective now               Patient's Body mass index is 38.43 kg/m.  DVT prophylaxis: SCDs Start: 08/11/20 0446 Code Status:   Code Status: Full Code  Family Communication: plan of care discussed with patient at bedside.  Status is: Inpatient  Remains inpatient appropriate because:IV treatments appropriate due to intensity of illness or inability to take PO and Inpatient level of care appropriate due to severity of illness   Dispo: The patient is from: Home              Anticipated d/c is to: Home              Patient currently is not medically stable to d/c.   Difficult to place patient No       Unresulted Labs (From admission, onward)          Start     Ordered   08/12/20 0500  Comprehensive metabolic panel  Tomorrow morning,   R        08/11/20 0448   08/12/20 0500  CBC  Tomorrow morning,   R        08/11/20 0448          Medications reviewed:  Scheduled Meds: . finasteride  5 mg Oral Daily  . insulin aspart  0-9 Units Subcutaneous Q4H  . loratadine  10 mg Oral Daily  . pantoprazole  80 mg Oral Q1200  . rosuvastatin  10 mg Oral QHS   Continuous Infusions: . lactated ringers 125 mL/hr at 08/11/20 0520  . piperacillin-tazobactam (ZOSYN)  IV      Consultants:see note  Procedures:see note  Antimicrobials: Anti-infectives (From admission, onward)   Start     Dose/Rate Route Frequency Ordered Stop   08/11/20 1200  piperacillin-tazobactam (ZOSYN) IVPB 3.375 g        3.375 g 12.5 mL/hr over 240 Minutes Intravenous Every 8 hours 08/11/20 0449     08/11/20 0430  piperacillin-tazobactam (ZOSYN) IVPB 3.375 g        3.375 g 100 mL/hr over 30 Minutes Intravenous  Once 08/11/20 0429 08/11/20 0721     Culture/Microbiology    Component Value Date/Time   SDES ABSCESS 05/01/2019 1014   SPECREQUEST Normal 05/01/2019 1014   CULT  05/01/2019 1014    MODERATE FUSOBACTERIUM  NUCLEATUM BETA LACTAMASE NEGATIVE FEW CLOSTRIDIUM SPECIES    REPTSTATUS 05/09/2019 FINAL 05/01/2019 1014    Other culture-see note  Objective: Vitals: Today's Vitals   08/11/20 0526 08/11/20 0700 08/11/20 0800 08/11/20 0916  BP: (!) 119/57  128/61 (!) 156/73  Pulse: 96  91 92  Resp: 16  20 18   Temp:    98.6 F (37 C)  TempSrc:      SpO2: 92%  93% 97%  Weight:      Height:      PainSc:  Asleep  3     Intake/Output Summary (Last 24 hours) at 08/11/2020 1147 Last data filed at 08/11/2020 0721 Gross per 24 hour  Intake 1050 ml  Output --  Net 1050 ml   Filed Weights   08/10/20 1914  Weight: 125 kg   Weight change:   Intake/Output from previous day: 04/13 0701 - 04/14 0700 In: 1000 [IV Piggyback:1000] Out: -  Intake/Output this shift: Total I/O In: 50 [IV Piggyback:50] Out: -  Filed Weights   08/10/20 1914  Weight: 125 kg    Examination: General exam: AAOx3, obese ,NAD, weak appearing. HEENT:Oral mucosa moist, Ear/Nose WNL grossly,dentition normal. Respiratory system: bilaterally diminished,no use of accessory muscle, non tender. Cardiovascular system: S1 & S2 +, regular, No JVD. Gastrointestinal system: Abdomen soft, mild tenderness in the epigastric area, NT,ND, BS+. Nervous System:Alert, awake, moving extremities and grossly nonfocal Extremities: No edema, distal peripheral pulses palpable.  Skin: No rashes,no icterus. MSK: Normal muscle bulk,tone, power  Data Reviewed: I have personally reviewed following labs and imaging studies CBC: Recent Labs  Lab 08/10/20 2114  WBC 14.0*  HGB 13.6  HCT 40.4  MCV 90.4  PLT 255   Basic Metabolic Panel: Recent Labs  Lab 08/11/20 0236  NA 136  K 3.9  CL 103  CO2 22  GLUCOSE 191*  BUN 18  CREATININE 1.45*  CALCIUM 9.1   GFR: Estimated Creatinine Clearance: 56.5 mL/min (A) (by C-G formula based on SCr of 1.45 mg/dL (H)). Liver Function Tests: Recent Labs  Lab 08/11/20 0236  AST 324*  ALT 268*   ALKPHOS 129*  BILITOT 3.3*  PROT 6.4*  ALBUMIN 3.7   Recent Labs  Lab 08/11/20 0510  LIPASE 1,225*   No results for input(s): AMMONIA in the last 168 hours. Coagulation Profile: No results for input(s): INR, PROTIME in the last 168 hours. Cardiac Enzymes: No results for input(s): CKTOTAL, CKMB, CKMBINDEX, TROPONINI in the last 168 hours. BNP (last 3 results) No results for input(s): PROBNP in the last 8760 hours. HbA1C: Recent Labs    08/11/20 0445  HGBA1C 6.5*  CBG: Recent Labs  Lab 08/11/20 0518 08/11/20 0734  GLUCAP 175* 127*   Lipid Profile: No results for input(s): CHOL, HDL, LDLCALC, TRIG, CHOLHDL, LDLDIRECT in the last 72 hours. Thyroid Function Tests: No results for input(s): TSH, T4TOTAL, FREET4, T3FREE, THYROIDAB in the last 72 hours. Anemia Panel: No results for input(s): VITAMINB12, FOLATE, FERRITIN, TIBC, IRON, RETICCTPCT in the last 72 hours. Sepsis Labs: Recent Labs  Lab 08/10/20 2114 08/10/20 2250  LATICACIDVEN 2.3* 2.4*    Recent Results (from the past 240 hour(s))  Resp Panel by RT-PCR (Flu A&B, Covid) Nasopharyngeal Swab     Status: None   Collection Time: 08/10/20 11:52 PM   Specimen: Nasopharyngeal Swab; Nasopharyngeal(NP) swabs in vial transport medium  Result Value Ref Range Status   SARS Coronavirus 2 by RT PCR NEGATIVE NEGATIVE Final    Comment: (NOTE) SARS-CoV-2 target nucleic acids are NOT DETECTED.  The SARS-CoV-2 RNA is generally detectable in upper respiratory specimens during the acute phase of infection. The lowest concentration of SARS-CoV-2 viral copies this assay can detect is 138 copies/mL. A negative result does not preclude SARS-Cov-2 infection and should not be used as the sole basis for treatment or other patient management decisions. A negative result may occur with  improper specimen collection/handling, submission of specimen other than nasopharyngeal swab, presence of viral mutation(s) within the areas  targeted by this assay, and inadequate number of viral copies(<138 copies/mL). A negative result must be combined with clinical observations, patient history, and epidemiological information. The expected result is Negative.  Fact Sheet for Patients:  BloggerCourse.com  Fact Sheet for Healthcare Providers:  SeriousBroker.it  This test is no t yet approved or cleared by the [place] FDA and  has been authorized for detection and/or diagnosis of SARS-CoV-2 by FDA under an Emergency Use Authorization (EUA). This EUA will remain  in effect (meaning this test can be used) for the duration of the COVID-19 declaration under Section 564(b)(1) of the Act, 21 U.S.C.section 360bbb-3(b)(1), unless the authorization is terminated  or revoked sooner.       Influenza A by PCR NEGATIVE NEGATIVE Final   Influenza B by PCR NEGATIVE NEGATIVE Final    Comment: (NOTE) The Xpert Xpress SARS-CoV-2/FLU/RSV plus assay is intended as an aid in the diagnosis of influenza from Nasopharyngeal swab specimens and should not be used as a sole basis for treatment. Nasal washings and aspirates are unacceptable for Xpert Xpress SARS-CoV-2/FLU/RSV testing.  Fact Sheet for Patients: BloggerCourse.com  Fact Sheet for Healthcare Providers: SeriousBroker.it  This test is not yet approved or cleared by the [place] FDA and has been authorized for detection and/or diagnosis of SARS-CoV-2 by FDA under an Emergency Use Authorization (EUA). This EUA will remain in effect (meaning this test can be used) for the duration of the COVID-19 declaration under Section 564(b)(1) of the Act, 21 U.S.C. section 360bbb-3(b)(1), unless the authorization is terminated or revoked.  Performed at [facility], [address] [place], [place] [address]      Radiology Studies: DG Abdomen Acute W/Chest  Result Date:  08/10/2020 CLINICAL DATA:  Chest and abdominal pain EXAM: DG ABDOMEN ACUTE WITH 1 VIEW CHEST COMPARISON:  11/08/2018 FINDINGS: Lungs are clear. No pneumothorax or pleural effusion. Coronary artery bypass grafting has been performed. Cardiac size within normal limits. Pulmonary vascularity is normal. Gas-filled loops of a dilated small bowel is seen within the epigastrium. Gas is seen within a nondistended colon. Together, the findings may reflect changes of a  partial small bowel obstruction or represent a sentinel loop secondary to a a focal inflammatory process within the epigastrium. No free intraperitoneal gas. No organomegaly. No abnormal calcifications within the abdomen and pelvis. IMPRESSION: No radiographic evidence of acute cardiopulmonary disease. Dilated gas-filled loop of small bowel within the epigastrium representative of either a proximal partial small bowel obstruction or a sentinel loop related to an adjacent inflammatory process. Electronically Signed   By: Helyn Numbers MD   On: 08/10/2020 23:16   CT Angio Abd/Pel W and/or Wo Contrast  Result Date: 08/11/2020 CLINICAL DATA:  Severe abdominal pain, diffuse tenderness to palpation, acute mesenteric ischemia EXAM: CTA ABDOMEN AND PELVIS WITHOUT AND WITH CONTRAST TECHNIQUE: Multidetector CT imaging of the abdomen and pelvis was performed using the standard protocol during bolus administration of intravenous contrast. Multiplanar reconstructed images and MIPs were obtained and reviewed to evaluate the vascular anatomy. CONTRAST:  OMNIPAQUE IOHEXOL 300 MG/ML  SOLN COMPARISON:  None. FINDINGS: VASCULAR Aorta: Normal caliber. No aneurysm or dissection. Extensive atherosclerotic calcification. No evidence of hemodynamically significant stenosis. No periaortic inflammatory change. Celiac: Less than 50% stenosis as its origin. Otherwise widely patent. No aneurysm or dissection. Conventional anatomic configuration. SMA: Less than 50% stenosis at  its origin. Otherwise widely patent. No aneurysm or dissection. Renals: Left and 50% stenosis of the single left renal artery. Dual right renal arteries are widely patent. Normal vascular morphology. No aneurysm. IMA: Widely patent. Inflow: Widely patent. No aneurysm or dissection. Internal iliac arteries are patent bilaterally. Proximal Outflow: Widely patent. Veins: Unremarkable Review of the MIP images confirms the above findings. NON-VASCULAR Lower chest: The visualized lung bases are clear. 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 Extensive atherosclerotic calcification within the native coronary arteries. Global cardiac size is mildly enlarged Hepatobiliary: Cholecystectomy has been performed. No intra or extrahepatic biliary ductal dilation. A 6 mm rounded calculus, however, is seen within the distal common duct in keeping with choledocholithiasis. Liver unremarkable. Pancreas: Unremarkable Spleen: Unremarkable Adrenals/Urinary Tract: 9 mm largely macroscopic fat containing nodule noted within the left adrenal gland is compatible with an adrenal myelolipoma. The adrenal glands are otherwise unremarkable. Multiple simple cortical cysts are seen within the a mid and lower pole of the left kidney. The kidneys are otherwise unremarkable. The bladder is unremarkable. Stomach/Bowel: Moderate sigmoid diverticulosis. There is mild circumferential wall thickening and hyperemia involving the ascending colon in keeping with changes of mild infectious or inflammatory colitis. No evidence of obstruction or perforation. The stomach, small bowel, and large bowel are otherwise unremarkable. Appendix normal. No free intraperitoneal gas or fluid. There is infiltration of the a mesenteric fat anteriorly subjacent to the a anterior abdominal wall most in keeping with fat necrosis and likely postsurgical in nature. This appears  similar to that noted on prior examination. Lymphatic: No pathologic adenopathy within the abdomen and pelvis. Reproductive: Prostate is unremarkable. Other: Previously noted complex ventral hernia has undergone interval repair. Tiny residual fat containing umbilical hernia. Small fat containing right inguinal hernia is again noted. Musculoskeletal: No acute bone abnormality. No lytic or blastic bone lesion identified. IMPRESSION: VASCULAR Extensive atherosclerotic calcification. No evidence of hemodynamically significant stenosis of the a mesenteric or renal vasculature, however. Wide patency of the mesenteric arterial and venous vasculature. NON-VASCULAR Choledocholithiasis with 6 mm calcification noted within the distal common duct. Mild hyperemia and subtle circumferential wall thickening involving the ascending colon in keeping with mild infectious or inflammatory  colitis. No evidence of obstruction or perforation. Moderate sigmoid diverticulosis without superimposed inflammatory change. Interval ventral hernia repair. 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 Aortic Atherosclerosis (ICD10-I70.0). Electronically Signed   By: Helyn NumbersAshesh  Parikh MD   On: 08/11/2020 04:14     LOS: 0 days   Lanae Boastamesh Abrar Bilton, MD [facility]  08/11/2020, 11:47 AM

## 2020-08-11 NOTE — Anesthesia Procedure Notes (Signed)
Procedure Name: Intubation Date/Time: 08/11/2020 3:52 PM Performed by: Valda Favia, CRNA Pre-anesthesia Checklist: Patient identified, Emergency Drugs available, Suction available and Patient being monitored Patient Re-evaluated:Patient Re-evaluated prior to induction Oxygen Delivery Method: Circle System Utilized Preoxygenation: Pre-oxygenation with 100% oxygen Induction Type: IV induction Ventilation: Mask ventilation without difficulty Laryngoscope Size: Mac and 4 Grade View: Grade I Tube type: Oral Tube size: 7.5 mm Number of attempts: 1 Airway Equipment and Method: Stylet Placement Confirmation: ETT inserted through vocal cords under direct vision,  positive ETCO2 and breath sounds checked- equal and bilateral Secured at: 23 cm Tube secured with: Tape Dental Injury: Teeth and Oropharynx as per pre-operative assessment

## 2020-08-11 NOTE — Anesthesia Preprocedure Evaluation (Addendum)
Anesthesia Evaluation  Patient identified by MRN, date of birth, ID band Patient awake    Reviewed: Allergy & Precautions, NPO status , Patient's Chart, lab work & pertinent test results  Airway Mallampati: II  TM Distance: >3 FB Neck ROM: Full    Dental  (+) Teeth Intact   Pulmonary sleep apnea and Continuous Positive Airway Pressure Ventilation , former smoker,    Pulmonary exam normal        Cardiovascular hypertension, Pt. on medications and Pt. on home beta blockers + CAD and + CABG   Rhythm:Regular Rate:Normal     Neuro/Psych  Headaches, Anxiety    GI/Hepatic Neg liver ROS, GERD  Medicated,choledocholithiasis   Endo/Other  diabetes, Type 2  Renal/GU negative Renal ROS  negative genitourinary   Musculoskeletal negative musculoskeletal ROS (+)   Abdominal (+)  Abdomen: soft. Bowel sounds: normal.  Peds  Hematology  (+) anemia ,   Anesthesia Other Findings   Reproductive/Obstetrics                            Anesthesia Physical Anesthesia Plan  ASA: III  Anesthesia Plan: General   Post-op Pain Management:    Induction: Intravenous  PONV Risk Score and Plan: 2 and Ondansetron, Dexamethasone and Treatment may vary due to age or medical condition  Airway Management Planned: Mask and Oral ETT  Additional Equipment: None  Intra-op Plan:   Post-operative Plan: Extubation in OR  Informed Consent: I have reviewed the patients History and Physical, chart, labs and discussed the procedure including the risks, benefits and alternatives for the proposed anesthesia with the patient or authorized representative who has indicated his/her understanding and acceptance.     Dental advisory given  Plan Discussed with: CRNA  Anesthesia Plan Comments: (Lab Results      Component                Value               Date                      WBC                      14.0 (H)             08/10/2020                HGB                      13.6                08/10/2020                HCT                      40.4                08/10/2020                MCV                      90.4                08/10/2020                PLT  255                 08/10/2020           Lab Results      Component                Value               Date                      NA                       136                 08/11/2020                K                        3.9                 08/11/2020                CO2                      22                  08/11/2020                GLUCOSE                  191 (H)             08/11/2020                BUN                      18                  08/11/2020                CREATININE               1.45 (H)            08/11/2020                CALCIUM                  9.1                 08/11/2020                GFRNONAA                 49 (L)              08/11/2020                GFRAA                    59 (L)              05/03/2019          )        Anesthesia Quick Evaluation

## 2020-08-11 NOTE — ED Notes (Signed)
Attempted to call report again at this time. Nurse and charge nurse unavailable.

## 2020-08-11 NOTE — Anesthesia Postprocedure Evaluation (Signed)
Anesthesia Post Note  Patient: [name]  Procedure(s) Performed: ENDOSCOPIC RETROGRADE CHOLANGIOPANCREATOGRAPHY (ERCP) WITH PROPOFOL (N/A ) SPHINCTEROTOMY REMOVAL OF STONES BIOPSY     Patient location during evaluation: Endoscopy Anesthesia Type: General Level of consciousness: awake and alert Pain management: pain level controlled Vital Signs Assessment: post-procedure vital signs reviewed and stable Respiratory status: spontaneous breathing, nonlabored ventilation and respiratory function stable Cardiovascular status: blood pressure returned to baseline and stable Postop Assessment: no apparent nausea or vomiting Anesthetic complications: no   No complications documented.  Last Vitals:  Vitals:   08/11/20 1644 08/11/20 1654  BP: (!) 160/63 (!) 143/74  Pulse: 86 84  Resp: 11 13  Temp: 36.5 C   SpO2: 94% 94%    Last Pain:  Vitals:   08/11/20 1654  TempSrc:   PainSc: 0-No pain                 Lucretia Kern

## 2020-08-11 NOTE — ED Notes (Signed)
Report given to 6N

## 2020-08-11 NOTE — Op Note (Signed)
[facility] Patient Name: [name] Procedure Date : 08/11/2020 MRN: [id number] Attending MD: Meryl Dare , MD Date of Birth: [birth year] CSN: [id number] Age: 75 Admit Type: Inpatient Procedure:                ERCP Indications:              Bile duct stone(s), Abdominal pain of suspected                            biliary or pancreatic origin, Elevated liver enzymes Providers:                Venita Lick. Russella Dar, MD, [place] Bailiff, RN, Lawson Radar, Technician Referring MD:             [facility] Medicines:                General Anesthesia Complications:            No immediate complications. Estimated Blood Loss:     Estimated blood loss: none. Procedure:                Pre-Anesthesia Assessment:                           - Prior to the procedure, a History and Physical                            was performed, and patient medications and                            allergies were reviewed. The patient's tolerance of                            previous anesthesia was also reviewed. The risks                            and benefits of the procedure and the sedation                            options and risks were discussed with the patient.                            All questions were answered, and informed consent                            was obtained. Prior Anticoagulants: The patient has                            taken no previous anticoagulant or antiplatelet                            agents. ASA Grade Assessment: III - A patient with  severe systemic disease. After reviewing the risks                            and benefits, the patient was deemed in                            satisfactory condition to undergo the procedure.                           After obtaining informed consent, the scope was                            passed under direct vision. Throughout the                            procedure, the  patient's blood pressure, pulse, and                            oxygen saturations were monitored continuously. The                            TJF- Q180V ([id number]) Olympus duodenoscope was                            introduced through the mouth, and used to inject                            contrast into and used to inject contrast into the                            bile duct. The ERCP was accomplished without                            difficulty. The patient tolerated the procedure                            well. Scope In: Scope Out: Findings:      A scout film of the abdomen was obtained. Surgical clips, consistent       with a previous cholecystectomy, were seen in the area of the right       upper quadrant of the abdomen. The scope was advanced to a normal major       papilla in the descending duodenum. Examination of the pharynx, larynx       and associated structures, and upper GI tract showed a 6 mm polypoid       nodule at the pylorus that was biopsied. In the duodenal bulb there were       sutures and a couple small nodules c/w prior duodenal surgery. A few       small nodules were noted in the antrum. The gastric body, fundus had       atrophic folds. The rest of the EGD appeared unremarkable with a limited       side viewing scope exam. The major papilla was normal. A straight       Roadrunner wire was passed into  the biliary tree. The short-nosed       traction sphincterotome was passed over the guidewire and the bile duct       was then deeply cannulated. Contrast was injected. I personally       interpreted the bile duct images. There was appropriate flow of contrast       through the ducts. The common bile duct was diffusely dilated, with a       stone causing an obstruction. The largest diameter was 10 mm. A       cholecystectomy had been performed. The intrahepatic ducts appeared       normal. The common bile duct contained one stone, which was 6 mm in       diameter.  An 8 mm biliary sphincterotomy was made with a traction       (standard) sphincterotome using ERBE electrocautery. There was no       post-sphincterotomy bleeding. The biliary tree was swept with a 12 mm       balloon starting at the bifurcation. One stone was removed. No stones       remained. Excellent biliary drainage was then noted. The PD was not       entered with the wire or catheter by intention. Impression:               - The major papilla appeared normal.                           - The common bile duct was mildly dilated, with a                            stone causing an obstruction.                           - Prior cholecystectomy.                           - Choledocholithiasis was found.                           - Complete stone removal was accomplished by                            biliary sphincterotomy and balloon extraction.                           - Gastric pylorus nodule. Biopsied.                           - Duodenal bulb sutures, post surgical changes.                           - Gastric antrum nodules.                           - Atrophic gastric folds. Recommendation:           - Return patient to hospital ward for ongoing care.                           -  Observe patient's clinical course following                            today's ERCP with therapeutic intervention.                           - Clear liquid diet today as tolerated.                           - Avoid aspirin and nonsteroidal anti-inflammatory                            medicines for 1 week.                           - Trend LFTs.                           - Await path results.                           - EGD findings will need follow up with the                            patients primary gastroenterologist, Dr. Dorna Leitz, as an outpatient. Procedure Code(s):        --- Professional ---                           [phone], Endoscopic retrograde                             cholangiopancreatography (ERCP); with removal of                            calculi/debris from biliary/pancreatic duct(s)                           43262, Endoscopic retrograde                            cholangiopancreatography (ERCP); with                            sphincterotomy/papillotomy Diagnosis Code(s):        --- Professional ---                           K80.51, Calculus of bile duct without cholangitis                            or cholecystitis with obstruction                           Z90.49, Acquired absence of other specified parts  of digestive tract                           R10.9, Unspecified abdominal pain                           R74.8, Abnormal levels of other serum enzymes CPT copyright 2019 [employer]. All rights reserved. The codes documented in this report are preliminary and upon coder review may  be revised to meet current compliance requirements. Meryl Dare, MD 08/11/2020 4:58:35 PM This report has been signed electronically. Number of Addenda: 0

## 2020-08-11 NOTE — Progress Notes (Signed)
Pharmacy Antibiotic Note  [name] is a 75 y.o. male admitted on 08/10/2020 with intra-abd infection.  Pharmacy has been consulted for Zosyn dosing.  Plan: Zosyn 3.375gm IV q8h Will f/u micro data, renal function, and pt's clinical condition  Height: 5\' 11"  (180.3 cm) Weight: 125 kg (275 lb 9.2 oz) IBW/kg (Calculated) : 75.3  Temp (24hrs), Avg:98.4 F (36.9 C), Min:98.4 F (36.9 C), Max:98.4 F (36.9 C)  Recent Labs  Lab 08/10/20 2114 08/10/20 2250 08/11/20 0236  WBC 14.0*  --   --   CREATININE  --   --  1.45*  LATICACIDVEN 2.3* 2.4*  --     Estimated Creatinine Clearance: 56.5 mL/min (A) (by C-G formula based on SCr of 1.45 mg/dL (H)).    Allergies  Allergen Reactions  . Flagyl [Metronidazole] Nausea And Vomiting  . Ciprofloxacin Rash  . Erythromycin Ethylsuccinate Rash    Antimicrobials this admission: 4/14 Zosyn >>   Microbiology results: Pending  Thank you for allowing pharmacy to be a part of this patient's care.  5/14, PharmD, BCPS Please see amion for complete clinical pharmacist phone list 08/11/2020 4:47 AM

## 2020-08-11 NOTE — ED Provider Notes (Signed)
Patient was received at shift change from [facility]  he provided HPI, current work-up, likely disposition.  Per his instructions follow-up on CT angio abdomen pelvis suspect he will be admitted due to small bowel obstruction.  CT abdomen reveals choledocholithiasis with a 6 mm calcification within the distal end of the common bile duct, mild wall thickening involving the ascending colon infectious versus inflammatory.   Suspect patient's pain is secondary due to choledocholithiasis, this would explain patient's elevated liver enzymes and alk phos.  Patient has noted elevated white count, will start him on Zosyn and admit to medicine for further work-up.  Spoke with Dr. Alcario Drought of the hospitalist team, he has accepted the patient he will come down and assess.  Patient care be transferred over to admitting team.   Marcello Fennel, PA-C 08/11/20 5329    Orpah Greek, MD 08/11/20 [phone]

## 2020-08-11 NOTE — Consult Note (Addendum)
[facility] Consult: 8:17 AM 08/11/2020  LOS: 0 days    Referring Provider: Dr. Antonieta Pert Primary Care Physician:  Guadlupe Spanish, MD  [facility] Primary Gastroenterologist:   Dr Rolm Bookbinder    Reason for Consultation: Choledocholithiasis w jaundice.  Biliary pancreatitis.   HPI: [name] is a 75 y.o. male.  PMH extensive biliary issues.  Initially presented in 04/2018 with perforated gallbladder, E. coli and Klebsiella abscess at gallbladder, liver management with percutaneous cholecystostomy tube and drains to liver abscess, prolonged antibiotics, Prot cal malnutrition.  Lap chole 09/2018. 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  Repair ventral hernia w mesh 02/2020.  CABG  03/2018.  Adenomatous colon polyps.  Colonoscopy with resection tubular adenomatous polyp at [facility] 12/2015.  DM 2, diet controlled.  Hyperlipidemia.  Atrial flutter, no longer on Eliquis.  LE cellulitis.  OSA.  Obesity.  Fatty liver seen on previous imaging studies.  Several weeks of nonradiating epigastric discomfort, would improve with food.  Several weeks of fatigue, mild malaise.  More difficulty participating in physical activity such as cardiac rehab and golfing.  No weight loss.  Seen by PCP on Monday and started on rosuvastatin and as omeprazole.  Yesterday morning the pain accelerated and became constant, he scored this as 13 out of 10.  Nausea with dry heaves, no emesis.  Chills, no actual fever.  Presented to the ED.  Blood pressures fluctuating but on arrival in the 140s/70s.  Has been as low as 100/57.  Initial heart rate in the low 100s, now steadily in the 90s.  Room air sats low 90s. T bili 3.3.  Alk phos 129.  AST/ALT 324/268.  Lipase 1225.  These were normal as recently as 07/12/2020.  Lactic acid 2.4.   WBCs 14.     CTAP/angio: 6 mm calcification at distal CBD.  Hyperemia, subtle thickening of wall at ascending colon.  Liver unremarkable.  Sigmoid diverticulosis.  Interval ventral hernia repair, stable changes of omental infarction involving anterior mesenteric fat, likely postsurgical in nature.  Extensive atherosclerotic calcification but no significant stenosis at the mesenteric or renal vasculature.  Widely patent mesenteric arterial and venous vasculature.  Started on Zosyn, received 1 L LR bolus and ongoing infusion, as needed fentanyl.  Pain today is at about 3/10 and overall he feels better but not well.  Positive family history colon cancer in his father.  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 Does not drink alcohol.  Past Medical History:  Diagnosis Date  . Allergies   . Anemia   . Anxiety   . CAD (coronary artery disease)   . Diabetes ([place])   . Dyspnea    upon exertion  . Gout   . Hepatitis    at 75 years old  . Hyperlipemia   . Hypertension   . Peripheral neuropathy    per patient "in feet"  . Sinus headache   . Sleep apnea    per patient wears CPAP just about every night  .  Ventral incisional hernia     Past Surgical History:  Procedure Laterality Date  . CHOLECYSTECTOMY N/A 10/07/2018   Procedure: Laparoscopic Cholecystectomy;  Surgeon: Rehaan Viloria Klein, MD;  Location: [place];  Service: General;  Laterality: N/A;  . CORONARY ARTERY BYPASS GRAFT N/A 03/31/2018   Procedure: CORONARY ARTERY BYPASS GRAFTING (CABG) TIMES FOUR USING LEFT INTERNAL MAMMARY ARTERY AND RIGHT GREATER SAPHENOUS VEIN HARVESTED ENDOSCOPICALLY.;  Surgeon: Ivin Poot, MD;  Location: [place];  Service: Open Heart Surgery;  Laterality: N/A;  . DUODENAL FISTULA REPAIR  04/07/2019   REPAIR OF DUODENAL FISTULA (N/A Abdomen)  . INCISIONAL HERNIA REPAIR  03/08/2020  . INSERTION OF MESH N/A 03/08/2020   Procedure: INSERTION  OF MESH;  Surgeon: Donnie Mesa, MD;  Location: [place];  Service: General;  Laterality: N/A;  . IR CATHETER TUBE CHANGE  12/09/2018  . IR CATHETER TUBE CHANGE  02/06/2019  . IR EXCHANGE BILIARY DRAIN  06/27/2018  . IR PERC CHOLECYSTOSTOMY  05/11/2018  . IR RADIOLOGIST EVAL & MGMT  06/11/2018  . IR RADIOLOGIST EVAL & MGMT  11/18/2018  . IR RADIOLOGIST EVAL & MGMT  12/03/2018  . IR RADIOLOGIST EVAL & MGMT  05/21/2019  . LEFT HEART CATH AND CORONARY ANGIOGRAPHY N/A 02/27/2018   Procedure: LEFT HEART CATH AND CORONARY ANGIOGRAPHY;  Surgeon: Lorretta Harp, MD;  Location: [place] [facility];  Service: Cardiovascular;  Laterality: N/A;  . liver absess  2010  . TEE WITHOUT CARDIOVERSION N/A 03/31/2018   Procedure: TRANSESOPHAGEAL ECHOCARDIOGRAM (TEE);  Surgeon: Prescott Gum, Collier Salina, MD;  Location: [place];  Service: Open Heart Surgery;  Laterality: N/A;  . TONSILLECTOMY     per patient  . VENTRAL HERNIA REPAIR N/A 03/08/2020   Procedure: OPEN VENTRAL HERNIA REPAIR WITH MESH;  Surgeon: Donnie Mesa, MD;  Location: [place];  Service: General;  Laterality: N/A;    Prior to Admission medications   Medication Sig Start Date End Date Taking? Authorizing Provider  amLODipine (NORVASC) 10 MG tablet Take 10 mg by mouth daily. 02/03/20  Yes [provider]  aspirin EC 81 MG tablet Take 81 mg by mouth every evening.    Yes [provider]  cholecalciferol (VITAMIN D) 25 MCG (1000 UT) tablet Take 1,000 Units by mouth in the morning and at bedtime.    Yes [provider]  Cyanocobalamin (B-12) 1000 MCG TABS Take 1,000 mcg by mouth at bedtime.   Yes [provider]  esomeprazole (NEXIUM) 40 MG capsule Take 40 mg by mouth daily. 08/08/20  Yes [provider]  finasteride (PROSCAR) 5 MG tablet Take 5 mg by mouth daily. 02/05/18  Yes [provider]  loratadine (CLARITIN) 10 MG tablet Take 10 mg by mouth daily.   Yes [provider]  losartan (COZAAR) 50 MG tablet  Take 50 mg by mouth daily. 08/08/20  Yes [provider]  metoprolol tartrate (LOPRESSOR) 50 MG tablet Take 50 mg by mouth in the morning and at bedtime. 02/03/20  Yes [provider]  Multiple Vitamins-Minerals (MULTIVITAMIN ADULT PO) Take 1 tablet by mouth daily.   Yes [provider]  rosuvastatin (CRESTOR) 10 MG tablet Take 10 mg by mouth at bedtime. 08/08/20  Yes [provider]    Scheduled Meds: . finasteride  5 mg Oral Daily  . insulin aspart  0-9 Units Subcutaneous Q4H  . loratadine  10 mg Oral Daily  . pantoprazole  80 mg Oral Q1200  . rosuvastatin  10 mg Oral QHS  Infusions: . lactated ringers 125 mL/hr at 08/11/20 0520  . piperacillin-tazobactam (ZOSYN)  IV     PRN Meds: fentaNYL (SUBLIMAZE) injection, ondansetron **OR** ondansetron (ZOFRAN) IV   Allergies as of 08/10/2020 - Review Complete 07/12/2020  Allergen Reaction Noted  . Flagyl [metronidazole] Nausea And Vomiting 02/23/2020  . Ciprofloxacin Rash 02/25/2018  . Erythromycin ethylsuccinate Rash 02/25/2018    Family History  Problem Relation Age of Onset  . Atrial fibrillation Mother   . Stroke Mother   . Cancer Maternal Aunt     Social History   Socioeconomic History  . Marital status: Widowed    Spouse name: Not on file  . Number of children: 2  . Years of education: Not on file  . Highest education level: Not on file  Occupational History  . Occupation: retired  Tobacco Use  . Smoking status: Former Smoker    Types: Cigarettes    Quit date: 1982    Years since quitting: 40.3  . Smokeless tobacco: Former Systems developer  . Tobacco comment: quit 1982  Vaping Use  . Vaping Use: Never used  Substance and Sexual Activity  . Alcohol use: Yes    Comment: very seldomly   . Drug use: Never  . Sexual activity: Not on file  Other Topics Concern  . 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   Social Determinants of Health    Financial Resource Strain: Not on file  Food Insecurity: Not on file  Transportation Needs: Not on file  Physical Activity: Not on file  Stress: Not on file  Social Connections: Not on file  Intimate Partner Violence: Not on file    REVIEW OF SYSTEMS: Constitutional: Fatigue, malaise. ENT:  No nose bleeds Pulm: No difficulty breathing, no cough. CV:  No palpitations, no LE edema.  No angina GU: Since receiving the fluid bolus and ongoing IV fluids he is peeing regularly. GI: See HPI. Heme: Denies unusual or excessive bleeding or bruising. Transfusions: None. Neuro:  No headaches, no peripheral tingling or numbness.  No dizziness, no seizures, no syncope. Derm:  No itching, no rash or sores.  Endocrine:  No sweats or chills.  No polyuria or dysuria Immunization: Not queried. Travel:  None beyond local counties in last few months.    PHYSICAL EXAM: Vital signs in last 24 hours: Vitals:   08/11/20 0400 08/11/20 0526  BP: (!) 119/57 (!) 119/57  Pulse: 96 96  Resp: 16 16  Temp:    SpO2: 90% 92%   Wt Readings from Last 3 Encounters:  08/10/20 125 kg  07/12/20 117.5 kg  03/08/20 117.1 kg    General: Obese, moderately ill-appearing, alert, comfortable. Head: No facial asymmetry or swelling.  No signs of head trauma. Eyes: No conjunctival pallor.  Slight scleral icterus and injected sclera. Ears: Not hard of hearing Nose: No congestion or discharge Mouth: Good dentition.  Moist, clear, pink mucosa.  Tongue midline. Neck: No JVD, no masses, no thyromegaly Lungs: No labored breathing, no cough.  Lungs clear bilaterally Heart: RRR.  No MRG.  S1, S2 present. Abdomen: Soft.  Mild epigastric tenderness without guarding or rebound.  No masses, hernias, bruits, organomegaly appreciated.  Bowel sounds hypoactive but normal quality.. Rectal: Deferred Musc/Skeltl: No obvious joint deformities, redness or swelling Extremities: No CCE Neurologic: Oriented x3.  Alert.  Good  historian.  Moves all 4 limbs with full strength.  No tremors. Skin: No obvious jaundice.  No telangiectasia. Nodes: No cervical adenopathy Psych: Affect somewhat blunted but pleasant.  Cooperative.  Calm.  Fluid speech.  Intake/Output from previous day: 04/13 0701 - 04/14 0700 In: 1000 [IV Piggyback:1000] Out: -  Intake/Output this shift: Total I/O In: 50 [IV Piggyback:50] Out: -   LAB RESULTS: Recent Labs    08/10/20 2114  WBC 14.0*  HGB 13.6  HCT 40.4  PLT 255   BMET Lab Results  Component Value Date   NA 136 08/11/2020   NA 132 (L) 03/10/2020   NA 133 (L) 03/09/2020   K 3.9 08/11/2020   K 4.2 03/10/2020   K 5.6 (H) 03/09/2020   CL 103 08/11/2020   CL 104 03/10/2020   CL 103 03/09/2020   CO2 22 08/11/2020   CO2 20 (L) 03/10/2020   CO2 20 (L) 03/09/2020   GLUCOSE 191 (H) 08/11/2020   GLUCOSE 142 (H) 03/10/2020   GLUCOSE 137 (H) 03/09/2020   BUN 18 08/11/2020   BUN 24 (H) 03/10/2020   BUN 26 (H) 03/09/2020   CREATININE 1.45 (H) 08/11/2020   CREATININE 1.55 (H) 03/10/2020   CREATININE 1.80 (H) 03/09/2020   CALCIUM 9.1 08/11/2020   CALCIUM 8.7 (L) 03/10/2020   CALCIUM 9.0 03/09/2020   LFT Recent Labs    08/11/20 0236  PROT 6.4*  ALBUMIN 3.7  AST 324*  ALT 268*  ALKPHOS 129*  BILITOT 3.3*   PT/INR Lab Results  Component Value Date   INR 1.2 05/01/2019   INR 1.1 04/08/2019   INR 1.0 04/07/2019   Hepatitis Panel No results for input(s): HEPBSAG, HCVAB, HEPAIGM, HEPBIGM in the last 72 hours. C-Diff No components found for: CDIFF Lipase     Component Value Date/Time   LIPASE 1,225 (H) 08/11/2020 0510    Drugs of Abuse  No results found for: LABOPIA, COCAINSCRNUR, LABBENZ, AMPHETMU, THCU, LABBARB   RADIOLOGY STUDIES: DG Abdomen Acute W/Chest  Result Date: 08/10/2020 CLINICAL DATA:  Chest and abdominal pain EXAM: DG ABDOMEN ACUTE WITH 1 VIEW CHEST COMPARISON:  11/08/2018 FINDINGS: Lungs are clear. No pneumothorax or pleural effusion.  Coronary artery bypass grafting has been performed. Cardiac size within normal limits. Pulmonary vascularity is normal. Gas-filled loops of a dilated small bowel is seen within the epigastrium. Gas is seen within a nondistended colon. Together, the findings may reflect changes of a partial small bowel obstruction or represent a sentinel loop secondary to a a focal inflammatory process within the epigastrium. No free intraperitoneal gas. No organomegaly. No abnormal calcifications within the abdomen and pelvis. IMPRESSION: No radiographic evidence of acute cardiopulmonary disease. Dilated gas-filled loop of small bowel within the epigastrium representative of either a proximal partial small bowel obstruction or a sentinel loop related to an adjacent inflammatory process. Electronically Signed   By: Fidela Salisbury MD   On: 08/10/2020 23:16   CT Angio Abd/Pel W and/or Wo Contrast  Result Date: 08/11/2020 CLINICAL DATA:  Severe abdominal pain, diffuse tenderness to palpation, acute mesenteric ischemia EXAM: CTA ABDOMEN AND PELVIS WITHOUT AND WITH CONTRAST TECHNIQUE: Multidetector CT imaging of the abdomen and pelvis was performed using the standard protocol during bolus administration of intravenous contrast. Multiplanar reconstructed images and MIPs were obtained and reviewed to evaluate the vascular anatomy. CONTRAST:  110m OMNIPAQUE IOHEXOL 300 MG/ML  SOLN COMPARISON:  None. FINDINGS: VASCULAR Aorta: Normal caliber. No aneurysm or dissection. Extensive atherosclerotic calcification. No evidence of hemodynamically significant stenosis. No periaortic inflammatory change. Celiac: Less than 50% stenosis as its origin. Otherwise widely  patent. No aneurysm or dissection. Conventional anatomic configuration. SMA: Less than 50% stenosis at its origin. Otherwise widely patent. No aneurysm or dissection. Renals: Left and 50% stenosis of the single left renal artery. Dual right renal arteries are widely patent. Normal  vascular morphology. No aneurysm. IMA: Widely patent. Inflow: Widely patent. No aneurysm or dissection. Internal iliac arteries are patent bilaterally. Proximal Outflow: Widely patent. Veins: Unremarkable Review of the MIP images confirms the above findings. NON-VASCULAR Lower chest: The visualized lung bases are clear. 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 Extensive atherosclerotic calcification within the native coronary arteries. Global cardiac size is mildly enlarged Hepatobiliary: Cholecystectomy has been performed. No intra or extrahepatic biliary ductal dilation. A 6 mm rounded calculus, however, is seen within the distal common duct in keeping with choledocholithiasis. Liver unremarkable. Pancreas: Unremarkable Spleen: Unremarkable Adrenals/Urinary Tract: 9 mm largely macroscopic fat containing nodule noted within the left adrenal gland is compatible with an adrenal myelolipoma. The adrenal glands are otherwise unremarkable. Multiple simple cortical cysts are seen within the a mid and lower pole of the left kidney. The kidneys are otherwise unremarkable. The bladder is unremarkable. Stomach/Bowel: Moderate sigmoid diverticulosis. There is mild circumferential wall thickening and hyperemia involving the ascending colon in keeping with changes of mild infectious or inflammatory colitis. No evidence of obstruction or perforation. The stomach, small bowel, and large bowel are otherwise unremarkable. Appendix normal. No free intraperitoneal gas or fluid. There is infiltration of the a mesenteric fat anteriorly subjacent to the a anterior abdominal wall most in keeping with fat necrosis and likely postsurgical in nature. This appears similar to that noted on prior examination. Lymphatic: No pathologic adenopathy within the abdomen and pelvis. Reproductive: Prostate is unremarkable. Other: Previously noted  complex ventral hernia has undergone interval repair. Tiny residual fat containing umbilical hernia. Small fat containing right inguinal hernia is again noted. Musculoskeletal: No acute bone abnormality. No lytic or blastic bone lesion identified. IMPRESSION: VASCULAR Extensive atherosclerotic calcification. No evidence of hemodynamically significant stenosis of the a mesenteric or renal vasculature, however. Wide patency of the mesenteric arterial and venous vasculature. NON-VASCULAR Choledocholithiasis with 6 mm calcification noted within the distal common duct. Mild hyperemia and subtle circumferential wall thickening involving the ascending colon in keeping with mild infectious or inflammatory colitis. No evidence of obstruction or perforation. Moderate sigmoid diverticulosis without superimposed inflammatory change. Interval ventral hernia repair. 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 Aortic Atherosclerosis (ICD10-I70.0). Electronically Signed   By: Fidela Salisbury MD   On: 08/11/2020 04:14    IMPRESSION:   *  Choledocholithiasis w obstructive jaundice and biliary pancreatitis.   Pancreatitis not severe, pancreas normal on the CT.     *   Hx complicated gallbladder disease beginning in 04/2018 with abscess at gallbladder fossa, hepatic abscess, perforated GB.  Initially treated with abscess drainage, percutaneous cholecystostomy tube.  Ultimately underwent lap chole 09/2018.  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  *    + lactic acidosis.    *    AKI, mild.    *     diet controlled DM2.    *   Hx adenomatous colon polyps.  latest colonoscopy 12/2015.     PLAN:     *     ERCP, at 1415 today  *   Up rate of LR to 175/hour.  Continue Zosyn.     Judson Roch  [place]  08/11/2020, 8:17 AM Phone [phone]     Attending Physician Note   I have taken a history, examined the patient and reviewed the chart. I agree with the  Advanced Practitioner's note, impression and recommendations.  Mild biliary pancreatitis, choledocholithiasis, obstructive jaundice. History of perforated gallbladder with GB fossa abscess, hepatic abscess in Jan 2020 treated with abscess drain and perc cholecystostomy tube. Underwent lap cholecystectomy in June 2020 and repair of a duodenal fistula in Dec 2020.  * Increase IVF for pancreatitis  * Continue IV Zosyn * ERCP today. The risks (including pancreatitis, bleeding, perforation, infection, missed lesions, medication reactions and possible hospitalization or surgery if complications occur), benefits, and alternatives to ERCP with possible sphincterotomy and possible stent placement were discussed with the patient and they consent to proceed.    Lucio Edward, MD FACG [phone]

## 2020-08-11 NOTE — Progress Notes (Addendum)
Called back to get report. 

## 2020-08-11 NOTE — H&P (View-Only) (Signed)
[facility] Consult: 8:17 AM 08/11/2020  LOS: 0 days    Referring Provider: Dr. Antonieta Pert Primary Care Physician:  Guadlupe Spanish, MD  [facility] Primary Gastroenterologist:   Dr Rolm Bookbinder    Reason for Consultation: Choledocholithiasis w jaundice.  Biliary pancreatitis.   HPI: [name] is a 75 y.o. male.  PMH extensive biliary issues.  Initially presented in 04/2018 with perforated gallbladder, E. coli and Klebsiella abscess at gallbladder, liver management with percutaneous cholecystostomy tube and drains to liver abscess, prolonged antibiotics, Prot cal malnutrition.  Lap chole 09/2018. 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  Repair ventral hernia w mesh 02/2020.  CABG  03/2018.  Adenomatous colon polyps.  Colonoscopy with resection tubular adenomatous polyp at [facility] 12/2015.  DM 2, diet controlled.  Hyperlipidemia.  Atrial flutter, no longer on Eliquis.  LE cellulitis.  OSA.  Obesity.  Fatty liver seen on previous imaging studies.  Several weeks of nonradiating epigastric discomfort, would improve with food.  Several weeks of fatigue, mild malaise.  More difficulty participating in physical activity such as cardiac rehab and golfing.  No weight loss.  Seen by PCP on Monday and started on rosuvastatin and as omeprazole.  Yesterday morning the pain accelerated and became constant, he scored this as 13 out of 10.  Nausea with dry heaves, no emesis.  Chills, no actual fever.  Presented to the ED.  Blood pressures fluctuating but on arrival in the 140s/70s.  Has been as low as 100/57.  Initial heart rate in the low 100s, now steadily in the 90s.  Room air sats low 90s. T bili 3.3.  Alk phos 129.  AST/ALT 324/268.  Lipase 1225.  These were normal as recently as 07/12/2020.  Lactic acid 2.4.   WBCs 14.     CTAP/angio: 6 mm calcification at distal CBD.  Hyperemia, subtle thickening of wall at ascending colon.  Liver unremarkable.  Sigmoid diverticulosis.  Interval ventral hernia repair, stable changes of omental infarction involving anterior mesenteric fat, likely postsurgical in nature.  Extensive atherosclerotic calcification but no significant stenosis at the mesenteric or renal vasculature.  Widely patent mesenteric arterial and venous vasculature.  Started on Zosyn, received 1 L LR bolus and ongoing infusion, as needed fentanyl.  Pain today is at about 3/10 and overall he feels better but not well.  Positive family history colon cancer in his father.  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 Does not drink alcohol.  Past Medical History:  Diagnosis Date  . Allergies   . Anemia   . Anxiety   . CAD (coronary artery disease)   . Diabetes ([place])   . Dyspnea    upon exertion  . Gout   . Hepatitis    at 75 years old  . Hyperlipemia   . Hypertension   . Peripheral neuropathy    per patient "in feet"  . Sinus headache   . Sleep apnea    per patient wears CPAP just about every night  .  Ventral incisional hernia     Past Surgical History:  Procedure Laterality Date  . CHOLECYSTECTOMY N/A 10/07/2018   Procedure: Laparoscopic Cholecystectomy;  Surgeon: Zadin Lange Klein, MD;  Location: [place];  Service: General;  Laterality: N/A;  . CORONARY ARTERY BYPASS GRAFT N/A 03/31/2018   Procedure: CORONARY ARTERY BYPASS GRAFTING (CABG) TIMES FOUR USING LEFT INTERNAL MAMMARY ARTERY AND RIGHT GREATER SAPHENOUS VEIN HARVESTED ENDOSCOPICALLY.;  Surgeon: Ivin Poot, MD;  Location: [place];  Service: Open Heart Surgery;  Laterality: N/A;  . DUODENAL FISTULA REPAIR  04/07/2019   REPAIR OF DUODENAL FISTULA (N/A Abdomen)  . INCISIONAL HERNIA REPAIR  03/08/2020  . INSERTION OF MESH N/A 03/08/2020   Procedure: INSERTION  OF MESH;  Surgeon: Donnie Mesa, MD;  Location: [place];  Service: General;  Laterality: N/A;  . IR CATHETER TUBE CHANGE  12/09/2018  . IR CATHETER TUBE CHANGE  02/06/2019  . IR EXCHANGE BILIARY DRAIN  06/27/2018  . IR PERC CHOLECYSTOSTOMY  05/11/2018  . IR RADIOLOGIST EVAL & MGMT  06/11/2018  . IR RADIOLOGIST EVAL & MGMT  11/18/2018  . IR RADIOLOGIST EVAL & MGMT  12/03/2018  . IR RADIOLOGIST EVAL & MGMT  05/21/2019  . LEFT HEART CATH AND CORONARY ANGIOGRAPHY N/A 02/27/2018   Procedure: LEFT HEART CATH AND CORONARY ANGIOGRAPHY;  Surgeon: Lorretta Harp, MD;  Location: [place] [facility];  Service: Cardiovascular;  Laterality: N/A;  . liver absess  2010  . TEE WITHOUT CARDIOVERSION N/A 03/31/2018   Procedure: TRANSESOPHAGEAL ECHOCARDIOGRAM (TEE);  Surgeon: Prescott Gum, Collier Salina, MD;  Location: [place];  Service: Open Heart Surgery;  Laterality: N/A;  . TONSILLECTOMY     per patient  . VENTRAL HERNIA REPAIR N/A 03/08/2020   Procedure: OPEN VENTRAL HERNIA REPAIR WITH MESH;  Surgeon: Donnie Mesa, MD;  Location: [place];  Service: General;  Laterality: N/A;    Prior to Admission medications   Medication Sig Start Date End Date Taking? Authorizing Provider  amLODipine (NORVASC) 10 MG tablet Take 10 mg by mouth daily. 02/03/20  Yes [provider]  aspirin EC 81 MG tablet Take 81 mg by mouth every evening.    Yes [provider]  cholecalciferol (VITAMIN D) 25 MCG (1000 UT) tablet Take 1,000 Units by mouth in the morning and at bedtime.    Yes [provider]  Cyanocobalamin (B-12) 1000 MCG TABS Take 1,000 mcg by mouth at bedtime.   Yes [provider]  esomeprazole (NEXIUM) 40 MG capsule Take 40 mg by mouth daily. 08/08/20  Yes [provider]  finasteride (PROSCAR) 5 MG tablet Take 5 mg by mouth daily. 02/05/18  Yes [provider]  loratadine (CLARITIN) 10 MG tablet Take 10 mg by mouth daily.   Yes [provider]  losartan (COZAAR) 50 MG tablet  Take 50 mg by mouth daily. 08/08/20  Yes [provider]  metoprolol tartrate (LOPRESSOR) 50 MG tablet Take 50 mg by mouth in the morning and at bedtime. 02/03/20  Yes [provider]  Multiple Vitamins-Minerals (MULTIVITAMIN ADULT PO) Take 1 tablet by mouth daily.   Yes [provider]  rosuvastatin (CRESTOR) 10 MG tablet Take 10 mg by mouth at bedtime. 08/08/20  Yes [provider]    Scheduled Meds: . finasteride  5 mg Oral Daily  . insulin aspart  0-9 Units Subcutaneous Q4H  . loratadine  10 mg Oral Daily  . pantoprazole  80 mg Oral Q1200  . rosuvastatin  10 mg Oral QHS  Infusions: . lactated ringers 125 mL/hr at 08/11/20 0520  . piperacillin-tazobactam (ZOSYN)  IV     PRN Meds: fentaNYL (SUBLIMAZE) injection, ondansetron **OR** ondansetron (ZOFRAN) IV   Allergies as of 08/10/2020 - Review Complete 07/12/2020  Allergen Reaction Noted  . Flagyl [metronidazole] Nausea And Vomiting 02/23/2020  . Ciprofloxacin Rash 02/25/2018  . Erythromycin ethylsuccinate Rash 02/25/2018    Family History  Problem Relation Age of Onset  . Atrial fibrillation Mother   . Stroke Mother   . Cancer Maternal Aunt     Social History   Socioeconomic History  . Marital status: Widowed    Spouse name: Not on file  . Number of children: 2  . Years of education: Not on file  . Highest education level: Not on file  Occupational History  . Occupation: retired  Tobacco Use  . Smoking status: Former Smoker    Types: Cigarettes    Quit date: 1982    Years since quitting: 40.3  . Smokeless tobacco: Former Systems developer  . Tobacco comment: quit 1982  Vaping Use  . Vaping Use: Never used  Substance and Sexual Activity  . Alcohol use: Yes    Comment: very seldomly   . Drug use: Never  . Sexual activity: Not on file  Other Topics Concern  . 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   Social Determinants of Health    Financial Resource Strain: Not on file  Food Insecurity: Not on file  Transportation Needs: Not on file  Physical Activity: Not on file  Stress: Not on file  Social Connections: Not on file  Intimate Partner Violence: Not on file    REVIEW OF SYSTEMS: Constitutional: Fatigue, malaise. ENT:  No nose bleeds Pulm: No difficulty breathing, no cough. CV:  No palpitations, no LE edema.  No angina GU: Since receiving the fluid bolus and ongoing IV fluids he is peeing regularly. GI: See HPI. Heme: Denies unusual or excessive bleeding or bruising. Transfusions: None. Neuro:  No headaches, no peripheral tingling or numbness.  No dizziness, no seizures, no syncope. Derm:  No itching, no rash or sores.  Endocrine:  No sweats or chills.  No polyuria or dysuria Immunization: Not queried. Travel:  None beyond local counties in last few months.    PHYSICAL EXAM: Vital signs in last 24 hours: Vitals:   08/11/20 0400 08/11/20 0526  BP: (!) 119/57 (!) 119/57  Pulse: 96 96  Resp: 16 16  Temp:    SpO2: 90% 92%   Wt Readings from Last 3 Encounters:  08/10/20 125 kg  07/12/20 117.5 kg  03/08/20 117.1 kg    General: Obese, moderately ill-appearing, alert, comfortable. Head: No facial asymmetry or swelling.  No signs of head trauma. Eyes: No conjunctival pallor.  Slight scleral icterus and injected sclera. Ears: Not hard of hearing Nose: No congestion or discharge Mouth: Good dentition.  Moist, clear, pink mucosa.  Tongue midline. Neck: No JVD, no masses, no thyromegaly Lungs: No labored breathing, no cough.  Lungs clear bilaterally Heart: RRR.  No MRG.  S1, S2 present. Abdomen: Soft.  Mild epigastric tenderness without guarding or rebound.  No masses, hernias, bruits, organomegaly appreciated.  Bowel sounds hypoactive but normal quality.. Rectal: Deferred Musc/Skeltl: No obvious joint deformities, redness or swelling Extremities: No CCE Neurologic: Oriented x3.  Alert.  Good  historian.  Moves all 4 limbs with full strength.  No tremors. Skin: No obvious jaundice.  No telangiectasia. Nodes: No cervical adenopathy Psych: Affect somewhat blunted but pleasant.  Cooperative.  Calm.  Fluid speech.  Intake/Output from previous day: 04/13 0701 - 04/14 0700 In: 1000 [IV Piggyback:1000] Out: -  Intake/Output this shift: Total I/O In: 50 [IV Piggyback:50] Out: -   LAB RESULTS: Recent Labs    08/10/20 2114  WBC 14.0*  HGB 13.6  HCT 40.4  PLT 255   BMET Lab Results  Component Value Date   NA 136 08/11/2020   NA 132 (L) 03/10/2020   NA 133 (L) 03/09/2020   K 3.9 08/11/2020   K 4.2 03/10/2020   K 5.6 (H) 03/09/2020   CL 103 08/11/2020   CL 104 03/10/2020   CL 103 03/09/2020   CO2 22 08/11/2020   CO2 20 (L) 03/10/2020   CO2 20 (L) 03/09/2020   GLUCOSE 191 (H) 08/11/2020   GLUCOSE 142 (H) 03/10/2020   GLUCOSE 137 (H) 03/09/2020   BUN 18 08/11/2020   BUN 24 (H) 03/10/2020   BUN 26 (H) 03/09/2020   CREATININE 1.45 (H) 08/11/2020   CREATININE 1.55 (H) 03/10/2020   CREATININE 1.80 (H) 03/09/2020   CALCIUM 9.1 08/11/2020   CALCIUM 8.7 (L) 03/10/2020   CALCIUM 9.0 03/09/2020   LFT Recent Labs    08/11/20 0236  PROT 6.4*  ALBUMIN 3.7  AST 324*  ALT 268*  ALKPHOS 129*  BILITOT 3.3*   PT/INR Lab Results  Component Value Date   INR 1.2 05/01/2019   INR 1.1 04/08/2019   INR 1.0 04/07/2019   Hepatitis Panel No results for input(s): HEPBSAG, HCVAB, HEPAIGM, HEPBIGM in the last 72 hours. C-Diff No components found for: CDIFF Lipase     Component Value Date/Time   LIPASE 1,225 (H) 08/11/2020 0510    Drugs of Abuse  No results found for: LABOPIA, COCAINSCRNUR, LABBENZ, AMPHETMU, THCU, LABBARB   RADIOLOGY STUDIES: DG Abdomen Acute W/Chest  Result Date: 08/10/2020 CLINICAL DATA:  Chest and abdominal pain EXAM: DG ABDOMEN ACUTE WITH 1 VIEW CHEST COMPARISON:  11/08/2018 FINDINGS: Lungs are clear. No pneumothorax or pleural effusion.  Coronary artery bypass grafting has been performed. Cardiac size within normal limits. Pulmonary vascularity is normal. Gas-filled loops of a dilated small bowel is seen within the epigastrium. Gas is seen within a nondistended colon. Together, the findings may reflect changes of a partial small bowel obstruction or represent a sentinel loop secondary to a a focal inflammatory process within the epigastrium. No free intraperitoneal gas. No organomegaly. No abnormal calcifications within the abdomen and pelvis. IMPRESSION: No radiographic evidence of acute cardiopulmonary disease. Dilated gas-filled loop of small bowel within the epigastrium representative of either a proximal partial small bowel obstruction or a sentinel loop related to an adjacent inflammatory process. Electronically Signed   By: Fidela Salisbury MD   On: 08/10/2020 23:16   CT Angio Abd/Pel W and/or Wo Contrast  Result Date: 08/11/2020 CLINICAL DATA:  Severe abdominal pain, diffuse tenderness to palpation, acute mesenteric ischemia EXAM: CTA ABDOMEN AND PELVIS WITHOUT AND WITH CONTRAST TECHNIQUE: Multidetector CT imaging of the abdomen and pelvis was performed using the standard protocol during bolus administration of intravenous contrast. Multiplanar reconstructed images and MIPs were obtained and reviewed to evaluate the vascular anatomy. CONTRAST:  110m OMNIPAQUE IOHEXOL 300 MG/ML  SOLN COMPARISON:  None. FINDINGS: VASCULAR Aorta: Normal caliber. No aneurysm or dissection. Extensive atherosclerotic calcification. No evidence of hemodynamically significant stenosis. No periaortic inflammatory change. Celiac: Less than 50% stenosis as its origin. Otherwise widely  patent. No aneurysm or dissection. Conventional anatomic configuration. SMA: Less than 50% stenosis at its origin. Otherwise widely patent. No aneurysm or dissection. Renals: Left and 50% stenosis of the single left renal artery. Dual right renal arteries are widely patent. Normal  vascular morphology. No aneurysm. IMA: Widely patent. Inflow: Widely patent. No aneurysm or dissection. Internal iliac arteries are patent bilaterally. Proximal Outflow: Widely patent. Veins: Unremarkable Review of the MIP images confirms the above findings. NON-VASCULAR Lower chest: The visualized lung bases are clear. 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 Extensive atherosclerotic calcification within the native coronary arteries. Global cardiac size is mildly enlarged Hepatobiliary: Cholecystectomy has been performed. No intra or extrahepatic biliary ductal dilation. A 6 mm rounded calculus, however, is seen within the distal common duct in keeping with choledocholithiasis. Liver unremarkable. Pancreas: Unremarkable Spleen: Unremarkable Adrenals/Urinary Tract: 9 mm largely macroscopic fat containing nodule noted within the left adrenal gland is compatible with an adrenal myelolipoma. The adrenal glands are otherwise unremarkable. Multiple simple cortical cysts are seen within the a mid and lower pole of the left kidney. The kidneys are otherwise unremarkable. The bladder is unremarkable. Stomach/Bowel: Moderate sigmoid diverticulosis. There is mild circumferential wall thickening and hyperemia involving the ascending colon in keeping with changes of mild infectious or inflammatory colitis. No evidence of obstruction or perforation. The stomach, small bowel, and large bowel are otherwise unremarkable. Appendix normal. No free intraperitoneal gas or fluid. There is infiltration of the a mesenteric fat anteriorly subjacent to the a anterior abdominal wall most in keeping with fat necrosis and likely postsurgical in nature. This appears similar to that noted on prior examination. Lymphatic: No pathologic adenopathy within the abdomen and pelvis. Reproductive: Prostate is unremarkable. Other: Previously noted  complex ventral hernia has undergone interval repair. Tiny residual fat containing umbilical hernia. Small fat containing right inguinal hernia is again noted. Musculoskeletal: No acute bone abnormality. No lytic or blastic bone lesion identified. IMPRESSION: VASCULAR Extensive atherosclerotic calcification. No evidence of hemodynamically significant stenosis of the a mesenteric or renal vasculature, however. Wide patency of the mesenteric arterial and venous vasculature. NON-VASCULAR Choledocholithiasis with 6 mm calcification noted within the distal common duct. Mild hyperemia and subtle circumferential wall thickening involving the ascending colon in keeping with mild infectious or inflammatory colitis. No evidence of obstruction or perforation. Moderate sigmoid diverticulosis without superimposed inflammatory change. Interval ventral hernia repair. 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 Aortic Atherosclerosis (ICD10-I70.0). Electronically Signed   By: Fidela Salisbury MD   On: 08/11/2020 04:14    IMPRESSION:   *  Choledocholithiasis w obstructive jaundice and biliary pancreatitis.   Pancreatitis not severe, pancreas normal on the CT.     *   Hx complicated gallbladder disease beginning in 04/2018 with abscess at gallbladder fossa, hepatic abscess, perforated GB.  Initially treated with abscess drainage, percutaneous cholecystostomy tube.  Ultimately underwent lap chole 09/2018.  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  *    + lactic acidosis.    *    AKI, mild.    *     diet controlled DM2.    *   Hx adenomatous colon polyps.  latest colonoscopy 12/2015.     PLAN:     *     ERCP, at 1415 today  *   Up rate of LR to 175/hour.  Continue Zosyn.     Judson Roch  [place]  08/11/2020, 8:17 AM Phone [phone]     Attending Physician Note   I have taken a history, examined the patient and reviewed the chart. I agree with the  Advanced Practitioner's note, impression and recommendations.  Mild biliary pancreatitis, choledocholithiasis, obstructive jaundice. History of perforated gallbladder with GB fossa abscess, hepatic abscess in Jan 2020 treated with abscess drain and perc cholecystostomy tube. Underwent lap cholecystectomy in June 2020 and repair of a duodenal fistula in Dec 2020.  * Increase IVF for pancreatitis  * Continue IV Zosyn * ERCP today. The risks (including pancreatitis, bleeding, perforation, infection, missed lesions, medication reactions and possible hospitalization or surgery if complications occur), benefits, and alternatives to ERCP with possible sphincterotomy and possible stent placement were discussed with the patient and they consent to proceed.    Lucio Edward, MD FACG [phone]

## 2020-08-11 NOTE — ED Notes (Signed)
Attempted to give report x1. Nurse unavailable at this time

## 2020-08-11 NOTE — Progress Notes (Signed)
Pt. Refuses cpap  ? ?

## 2020-08-11 NOTE — H&P (Signed)
History and Physical    [name] TGG:[id number] DOB: 01/27/1943 DOA: 08/10/2020  PCP: Calvin Plumber, MD  Patient coming from: Home  I have personally briefly reviewed patient's old medical records in [facility]  Chief Complaint: Abd pain  HPI: [name] is a 75 y.o. male with medical history significant of CAD s/p CABG, DM2, HTN, HLD.  Pt is s/p cholecystectomy in 2020.  Duodenal fistula repair in late 2020.  Ventral hernia repair with mesh in Nov 2021.  Pt presents to the ED with c/o abd pain located in epigastric area, rates pain as 11/10, constant, achy but occasionally sharp and stabbing.  Some nausea this AM.  Normal BM this AM.  No urinary frequency nor urgency.  Pain onset yesterday and has been persistent.  No fevers, chills, emesis, diarrhea.   ED Course: LFTs elevated.  CT AP demonstrates a 67mm stone in CBD.   Review of Systems: As per HPI, otherwise all review of systems negative.  Past Medical History:  Diagnosis Date  . Allergies   . Anemia   . Anxiety   . CAD (coronary artery disease)   . Diabetes (HCC)   . Dyspnea    upon exertion  . Gout   . Hepatitis    at 75 years old  . Hyperlipemia   . Hypertension   . Peripheral neuropathy    per patient "in feet"  . Sinus headache   . Sleep apnea    per patient wears CPAP just about every night  . Ventral incisional hernia     Past Surgical History:  Procedure Laterality Date  . CHOLECYSTECTOMY N/A 10/07/2018   Procedure: Laparoscopic Cholecystectomy;  Surgeon: [name], MD;  Location: [facility] OR;  Service: General;  Laterality: N/A;  . CORONARY ARTERY BYPASS GRAFT N/A 03/31/2018   Procedure: CORONARY ARTERY BYPASS GRAFTING (CABG) TIMES FOUR USING LEFT INTERNAL MAMMARY ARTERY AND RIGHT GREATER SAPHENOUS VEIN HARVESTED ENDOSCOPICALLY.;  Surgeon: Kerin Perna, MD;  Location: [facility] OR;  Service: Open Heart Surgery;  Laterality: N/A;  . DUODENAL FISTULA REPAIR  04/07/2019   REPAIR OF  DUODENAL FISTULA (N/A Abdomen)  . INCISIONAL HERNIA REPAIR  03/08/2020  . INSERTION OF MESH N/A 03/08/2020   Procedure: INSERTION OF MESH;  Surgeon: Manus Rudd, MD;  Location: [facility] OR;  Service: General;  Laterality: N/A;  . IR CATHETER TUBE CHANGE  12/09/2018  . IR CATHETER TUBE CHANGE  02/06/2019  . IR EXCHANGE BILIARY DRAIN  06/27/2018  . IR PERC CHOLECYSTOSTOMY  05/11/2018  . IR RADIOLOGIST EVAL & MGMT  06/11/2018  . IR RADIOLOGIST EVAL & MGMT  11/18/2018  . IR RADIOLOGIST EVAL & MGMT  12/03/2018  . IR RADIOLOGIST EVAL & MGMT  05/21/2019  . LEFT HEART CATH AND CORONARY ANGIOGRAPHY N/A 02/27/2018   Procedure: LEFT HEART CATH AND CORONARY ANGIOGRAPHY;  Surgeon: Runell Gess, MD;  Location: [facility] INVASIVE CV LAB;  Service: Cardiovascular;  Laterality: N/A;  . liver absess  2010  . TEE WITHOUT CARDIOVERSION N/A 03/31/2018   Procedure: TRANSESOPHAGEAL ECHOCARDIOGRAM (TEE);  Surgeon: Donata Clay, Theron Arista, MD;  Location: [facility] OR;  Service: Open Heart Surgery;  Laterality: N/A;  . TONSILLECTOMY     per patient  . VENTRAL HERNIA REPAIR N/A 03/08/2020   Procedure: OPEN VENTRAL HERNIA REPAIR WITH MESH;  Surgeon: Manus Rudd, MD;  Location: [facility] OR;  Service: General;  Laterality: N/A;     reports that he quit smoking about 40 years ago. His smoking  use included cigarettes. He has quit using smokeless tobacco. He reports current alcohol use. He reports that he does not use drugs.  Allergies  Allergen Reactions  . Flagyl [Metronidazole] Nausea And Vomiting  . Ciprofloxacin Rash  . Erythromycin Ethylsuccinate Rash    Family History  Problem Relation Age of Onset  . Atrial fibrillation Mother   . Stroke Mother   . Cancer Maternal Aunt      Prior to Admission medications   Medication Sig Start Date End Date Taking? Authorizing Provider  amLODipine (NORVASC) 10 MG tablet Take 10 mg by mouth daily. 02/03/20  Yes [provider]  aspirin EC 81 MG tablet Take 81 mg by mouth every evening.     Yes [provider]  cholecalciferol (VITAMIN D) 25 MCG (1000 UT) tablet Take 1,000 Units by mouth in the morning and at bedtime.    Yes [provider]  Cyanocobalamin (B-12) 1000 MCG TABS Take 1,000 mcg by mouth at bedtime.   Yes [provider]  esomeprazole (NEXIUM) 40 MG capsule Take 40 mg by mouth daily. 08/08/20  Yes [provider]  finasteride (PROSCAR) 5 MG tablet Take 5 mg by mouth daily. 02/05/18  Yes [provider]  loratadine (CLARITIN) 10 MG tablet Take 10 mg by mouth daily.   Yes [provider]  losartan (COZAAR) 50 MG tablet Take 50 mg by mouth daily. 08/08/20  Yes [provider]  metoprolol tartrate (LOPRESSOR) 50 MG tablet Take 50 mg by mouth in the morning and at bedtime. 02/03/20  Yes [provider]  Multiple Vitamins-Minerals (MULTIVITAMIN ADULT PO) Take 1 tablet by mouth daily.   Yes [provider]  rosuvastatin (CRESTOR) 10 MG tablet Take 10 mg by mouth at bedtime. 08/08/20  Yes [provider]    Physical Exam: Vitals:   08/11/20 0245 08/11/20 0300 08/11/20 0342 08/11/20 0400  BP: (!) 100/57 116/69 (!) 137/58 (!) 119/57  Pulse: 95 96 96 96  Resp:  16 16 16   Temp:      TempSrc:      SpO2: 91% 90% 92% 90%  Weight:      Height:        Constitutional: Uncomfortable with abd pain. Eyes: PERRL, lids and conjunctivae normal ENMT: Mucous membranes are moist. Posterior pharynx clear of any exudate or lesions.Normal dentition.  Neck: normal, supple, no masses, no thyromegaly Respiratory: clear to auscultation bilaterally, no wheezing, no crackles. Normal respiratory effort. No accessory muscle use.  Cardiovascular: Regular rate and rhythm, no murmurs / rubs / gallops. No extremity edema. 2+ pedal pulses. No carotid bruits.  Abdomen: TTP. Musculoskeletal: no clubbing / cyanosis. No joint deformity upper and lower extremities. Good ROM, no contractures. Normal muscle tone.  Skin: no  rashes, lesions, ulcers. No induration Neurologic: CN 2-12 grossly intact. Sensation intact, DTR normal. Strength 5/5 in all 4.  Psychiatric: Normal judgment and insight. Alert and oriented x 3. Normal mood.    Labs on Admission: I have personally reviewed following labs and imaging studies  CBC: Recent Labs  Lab 08/10/20 2114  WBC 14.0*  HGB 13.6  HCT 40.4  MCV 90.4  PLT 255   Basic Metabolic Panel: Recent Labs  Lab 08/11/20 0236  NA 136  K 3.9  CL 103  CO2 22  GLUCOSE 191*  BUN 18  CREATININE 1.45*  CALCIUM 9.1   GFR: Estimated Creatinine Clearance: 56.5 mL/min (A) (by C-G formula based on SCr of 1.45 mg/dL (H)). Liver Function  Tests: Recent Labs  Lab 08/11/20 0236  AST 324*  ALT 268*  ALKPHOS 129*  BILITOT 3.3*  PROT 6.4*  ALBUMIN 3.7   No results for input(s): LIPASE, AMYLASE in the last 168 hours. No results for input(s): AMMONIA in the last 168 hours. Coagulation Profile: No results for input(s): INR, PROTIME in the last 168 hours. Cardiac Enzymes: No results for input(s): CKTOTAL, CKMB, CKMBINDEX, TROPONINI in the last 168 hours. BNP (last 3 results) No results for input(s): PROBNP in the last 8760 hours. HbA1C: No results for input(s): HGBA1C in the last 72 hours. CBG: No results for input(s): GLUCAP in the last 168 hours. Lipid Profile: No results for input(s): CHOL, HDL, LDLCALC, TRIG, CHOLHDL, LDLDIRECT in the last 72 hours. Thyroid Function Tests: No results for input(s): TSH, T4TOTAL, FREET4, T3FREE, THYROIDAB in the last 72 hours. Anemia Panel: No results for input(s): VITAMINB12, FOLATE, FERRITIN, TIBC, IRON, RETICCTPCT in the last 72 hours. Urine analysis:    Component Value Date/Time   COLORURINE YELLOW 08/10/2020 1914   APPEARANCEUR CLEAR 08/10/2020 1914   LABSPEC 1.011 08/10/2020 1914   PHURINE 5.0 08/10/2020 1914   GLUCOSEU NEGATIVE 08/10/2020 1914   HGBUR NEGATIVE 08/10/2020 1914   BILIRUBINUR NEGATIVE 08/10/2020 1914    KETONESUR NEGATIVE 08/10/2020 1914   PROTEINUR NEGATIVE 08/10/2020 1914   NITRITE NEGATIVE 08/10/2020 1914   LEUKOCYTESUR NEGATIVE 08/10/2020 1914    Radiological Exams on Admission: DG Abdomen Acute W/Chest  Result Date: 08/10/2020 CLINICAL DATA:  Chest and abdominal pain EXAM: DG ABDOMEN ACUTE WITH 1 VIEW CHEST COMPARISON:  11/08/2018 FINDINGS: Lungs are clear. No pneumothorax or pleural effusion. Coronary artery bypass grafting has been performed. Cardiac size within normal limits. Pulmonary vascularity is normal. Gas-filled loops of a dilated small bowel is seen within the epigastrium. Gas is seen within a nondistended colon. Together, the findings may reflect changes of a partial small bowel obstruction or represent a sentinel loop secondary to a a focal inflammatory process within the epigastrium. No free intraperitoneal gas. No organomegaly. No abnormal calcifications within the abdomen and pelvis. IMPRESSION: No radiographic evidence of acute cardiopulmonary disease. Dilated gas-filled loop of small bowel within the epigastrium representative of either a proximal partial small bowel obstruction or a sentinel loop related to an adjacent inflammatory process. Electronically Signed   By: Helyn Numbers MD   On: 08/10/2020 23:16   CT Angio Abd/Pel W and/or Wo Contrast  Result Date: 08/11/2020 CLINICAL DATA:  Severe abdominal pain, diffuse tenderness to palpation, acute mesenteric ischemia EXAM: CTA ABDOMEN AND PELVIS WITHOUT AND WITH CONTRAST TECHNIQUE: Multidetector CT imaging of the abdomen and pelvis was performed using the standard protocol during bolus administration of intravenous contrast. Multiplanar reconstructed images and MIPs were obtained and reviewed to evaluate the vascular anatomy. CONTRAST:  OMNIPAQUE IOHEXOL 300 MG/ML  SOLN COMPARISON:  None. FINDINGS: VASCULAR Aorta: Normal caliber. No aneurysm or dissection. Extensive atherosclerotic calcification. No evidence of  hemodynamically significant stenosis. No periaortic inflammatory change. Celiac: Less than 50% stenosis as its origin. Otherwise widely patent. No aneurysm or dissection. Conventional anatomic configuration. SMA: Less than 50% stenosis at its origin. Otherwise widely patent. No aneurysm or dissection. Renals: Left and 50% stenosis of the single left renal artery. Dual right renal arteries are widely patent. Normal vascular morphology. No aneurysm. IMA: Widely patent. Inflow: Widely patent. No aneurysm or dissection. Internal iliac arteries are patent bilaterally. Proximal Outflow: Widely patent. Veins: Unremarkable Review of the MIP images confirms the above findings. NON-VASCULAR Lower  chest: The visualized lung bases are clear. 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 Extensive atherosclerotic calcification within the native coronary arteries. Global cardiac size is mildly enlarged Hepatobiliary: Cholecystectomy has been performed. No intra or extrahepatic biliary ductal dilation. A 6 mm rounded calculus, however, is seen within the distal common duct in keeping with choledocholithiasis. Liver unremarkable. Pancreas: Unremarkable Spleen: Unremarkable Adrenals/Urinary Tract: 9 mm largely macroscopic fat containing nodule noted within the left adrenal gland is compatible with an adrenal myelolipoma. The adrenal glands are otherwise unremarkable. Multiple simple cortical cysts are seen within the a mid and lower pole of the left kidney. The kidneys are otherwise unremarkable. The bladder is unremarkable. Stomach/Bowel: Moderate sigmoid diverticulosis. There is mild circumferential wall thickening and hyperemia involving the ascending colon in keeping with changes of mild infectious or inflammatory colitis. No evidence of obstruction or perforation. The stomach, small bowel, and large bowel are otherwise unremarkable.  Appendix normal. No free intraperitoneal gas or fluid. There is infiltration of the a mesenteric fat anteriorly subjacent to the a anterior abdominal wall most in keeping with fat necrosis and likely postsurgical in nature. This appears similar to that noted on prior examination. Lymphatic: No pathologic adenopathy within the abdomen and pelvis. Reproductive: Prostate is unremarkable. Other: Previously noted complex ventral hernia has undergone interval repair. Tiny residual fat containing umbilical hernia. Small fat containing right inguinal hernia is again noted. Musculoskeletal: No acute bone abnormality. No lytic or blastic bone lesion identified. IMPRESSION: VASCULAR Extensive atherosclerotic calcification. No evidence of hemodynamically significant stenosis of the a mesenteric or renal vasculature, however. Wide patency of the mesenteric arterial and venous vasculature. NON-VASCULAR Choledocholithiasis with 6 mm calcification noted within the distal common duct. Mild hyperemia and subtle circumferential wall thickening involving the ascending colon in keeping with mild infectious or inflammatory colitis. No evidence of obstruction or perforation. Moderate sigmoid diverticulosis without superimposed inflammatory change. Interval ventral hernia repair. 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 Aortic Atherosclerosis (ICD10-I70.0). Electronically Signed   By: Helyn Numbers MD   On: 08/11/2020 04:14    EKG: Independently reviewed.  Assessment/Plan Principal Problem:   Choledocholithiasis Active Problems:   Essential hypertension   Hx of CABG   Paroxysmal atrial flutter (HCC)   Non-insulin dependent type 2 diabetes mellitus (HCC)    1. Choledocholithiasis - 1. 6mm CBD stone on CT scan 2. Checking lipase 3. Leaving pt on zosyn for now given WBC 14k 4. Fentanyl PRN pain 5. NPO 6. IVF: LR at 125 7. Message sent to Dr. Christella Hartigan for GI  consult this AM, suspect he will need ERCP. 2. H/o PAF - 1. Not on chronic anticoags it appears, looks like was on amiodarone and eliquis "in the past" according to Dr. Hazle Coca 3/15 note, but doesn't look like hes on any anticoags a the moment. 3. HTN - 1. Hold home BP meds 4. NIDDM - 1. Sensitive SSI Q4H  DVT prophylaxis: SCDs Code Status: Full Family Communication: No family in room Disposition Plan: Home after GI clears pt (presumably pt needs ERCP) Consults called: Message sent to Dr. Christella Hartigan Admission status: Admit to inpatient  Severity of Illness: The appropriate patient status for this patient is INPATIENT. Inpatient status is judged to be reasonable and necessary in order to provide the required intensity of service to ensure the patient's safety. The patient's presenting symptoms, physical exam findings, and initial radiographic  and laboratory data in the context of their chronic comorbidities is felt to place them at high risk for further clinical deterioration. Furthermore, it is not anticipated that the patient will be medically stable for discharge from the hospital within 2 midnights of admission. The following factors support the patient status of inpatient.   IP status for choledocholithiasis with 6mm CBD stone, LFT elevations, suspect pt will need ERCP.   * I certify that at the point of admission it is my clinical judgment that the patient will require inpatient hospital care spanning beyond 2 midnights from the point of admission due to high intensity of service, high risk for further deterioration and high frequency of surveillance required.*    Abubakr Wieman M. DO [facility]  How to contact the [facility] Attending or Consulting provider 7A - 7P or covering provider during after hours 7P -7A, for this patient?  1. Check the care team in [facility] and look for a) attending/consulting [facility] provider listed and b) the [facility] team listed 2. Log into [email or web address]  Amion  Physician Scheduling and messaging for groups and whole hospitals  On call and physician scheduling software for group practices, residents, hospitalists and other medical providers for call, clinic, rotation and shift schedules. On[employer] is a hospital-wide system for scheduling doctors and paging doctors on call. EasyPlot is for scientific plotting and data analysis.  [email or web address]  and use [place]'s universal password to access. If you do not have the password, please contact the hospital operator.  3. Locate the [facility] provider you are looking for under [facility] and page to a number that you can be directly reached. 4. If you still have difficulty reaching the provider, please page the [facility] (Director on Call) for the Hospitalists listed on amion for assistance.  08/11/2020, 5:14 AM

## 2020-08-11 NOTE — Interval H&P Note (Signed)
History and Physical Interval Note:  08/11/2020 2:04 PM  [name]  has presented today for surgery, with the diagnosis of Choledocholithiasis.  The various methods of treatment have been discussed with the patient and family. After consideration of risks, benefits and other options for treatment, the patient has consented to  Procedure(s): ENDOSCOPIC RETROGRADE CHOLANGIOPANCREATOGRAPHY (ERCP) WITH PROPOFOL (N/A) as a surgical intervention.  The patient's history has been reviewed, patient examined, no change in status, stable for surgery.  I have reviewed the patient's chart and labs.  Questions were answered to the patient's satisfaction.     Venita Lick. Russella Dar

## 2020-08-11 NOTE — Transfer of Care (Signed)
Immediate Anesthesia Transfer of Care Note  Patient: [name]  Procedure(s) Performed: ENDOSCOPIC RETROGRADE CHOLANGIOPANCREATOGRAPHY (ERCP) WITH PROPOFOL (N/A ) SPHINCTEROTOMY REMOVAL OF STONES BIOPSY  Patient Location: Endoscopy Unit  Anesthesia Type:General  Level of Consciousness: awake, alert  and patient cooperative  Airway & Oxygen Therapy: Patient Spontanous Breathing and Patient connected to face mask oxygen  Post-op Assessment: Report given to RN and Post -op Vital signs reviewed and stable  Post vital signs: Reviewed and stable  Last Vitals:  Vitals Value Taken Time  BP 160/63 08/11/20 1644  Temp 36.5 C 08/11/20 1644  Pulse 86 08/11/20 1645  Resp 11 08/11/20 1645  SpO2 93 % 08/11/20 1645  Vitals shown include unvalidated device data.  Last Pain:  Vitals:   08/11/20 1644  TempSrc: Temporal  PainSc: 0-No pain         Complications: No complications documented.

## 2020-08-11 NOTE — Progress Notes (Signed)
Called back to get report. No answer. [phone])

## 2020-08-12 ENCOUNTER — Encounter (HOSPITAL_COMMUNITY): Payer: Self-pay | Admitting: Gastroenterology

## 2020-08-12 DIAGNOSIS — K8050 Calculus of bile duct without cholangitis or cholecystitis without obstruction: Secondary | ICD-10-CM | POA: Diagnosis not present

## 2020-08-12 LAB — COMPREHENSIVE METABOLIC PANEL
ALT: 178 U/L — ABNORMAL HIGH (ref 0–44)
ALT: 152 U/L — ABNORMAL HIGH (ref 0–44)
AST: 113 U/L — ABNORMAL HIGH (ref 15–41)
AST: 80 U/L — ABNORMAL HIGH (ref 15–41)
Albumin: 2.8 g/dL — ABNORMAL LOW (ref 3.5–5.0)
Albumin: 2.8 g/dL — ABNORMAL LOW (ref 3.5–5.0)
Alkaline Phosphatase: 101 U/L (ref 38–126)
Alkaline Phosphatase: 111 U/L (ref 38–126)
Anion gap: 5 (ref 5–15)
Anion gap: 7 (ref 5–15)
BUN: 14 mg/dL (ref 8–23)
BUN: 12 mg/dL (ref 8–23)
CO2: 24 mmol/L (ref 22–32)
CO2: 26 mmol/L (ref 22–32)
Calcium: 8.4 mg/dL — ABNORMAL LOW (ref 8.9–10.3)
Calcium: 8.6 mg/dL — ABNORMAL LOW (ref 8.9–10.3)
Chloride: 106 mmol/L (ref 98–111)
Chloride: 104 mmol/L (ref 98–111)
Creatinine, Ser: 1.62 mg/dL — ABNORMAL HIGH (ref 0.61–1.24)
Creatinine, Ser: 1.55 mg/dL — ABNORMAL HIGH (ref 0.61–1.24)
GFR, Estimated: 43 mL/min — ABNORMAL LOW (ref >60)
GFR, Estimated: 46 mL/min — ABNORMAL LOW (ref >60)
Glucose, Bld: 111 mg/dL — ABNORMAL HIGH (ref 70–99)
Glucose, Bld: 121 mg/dL — ABNORMAL HIGH (ref 70–99)
Potassium: 3.5 mmol/L (ref 3.5–5.1)
Potassium: 3.7 mmol/L (ref 3.5–5.1)
Sodium: 135 mmol/L (ref 135–145)
Sodium: 137 mmol/L (ref 135–145)
Total Bilirubin: 4.6 mg/dL — ABNORMAL HIGH (ref 0.3–1.2)
Total Bilirubin: 2.8 mg/dL — ABNORMAL HIGH (ref 0.3–1.2)
Total Protein: 5.4 g/dL — ABNORMAL LOW (ref 6.5–8.1)
Total Protein: 5.6 g/dL — ABNORMAL LOW (ref 6.5–8.1)

## 2020-08-12 LAB — CBC
HCT: 32.4 % — ABNORMAL LOW (ref 39.0–52.0)
Hemoglobin: 10.7 g/dL — ABNORMAL LOW (ref 13.0–17.0)
MCH: 30.4 pg (ref 26.0–34.0)
MCHC: 33 g/dL (ref 30.0–36.0)
MCV: 92 fL (ref 80.0–100.0)
Platelets: 151 K/uL (ref 150–400)
RBC: 3.52 MIL/uL — ABNORMAL LOW (ref 4.22–5.81)
RDW: 14.3 % (ref 11.5–15.5)
WBC: 6.2 K/uL (ref 4.0–10.5)
nRBC: 0 % (ref 0.0–0.2)

## 2020-08-12 LAB — GLUCOSE, CAPILLARY
Glucose-Capillary: 106 mg/dL — ABNORMAL HIGH (ref 70–99)
Glucose-Capillary: 118 mg/dL — ABNORMAL HIGH (ref 70–99)
Glucose-Capillary: 123 mg/dL — ABNORMAL HIGH (ref 70–99)
Glucose-Capillary: 135 mg/dL — ABNORMAL HIGH (ref 70–99)
Glucose-Capillary: 174 mg/dL — ABNORMAL HIGH (ref 70–99)

## 2020-08-12 MED ORDER — ROSUVASTATIN CALCIUM 10 MG PO TABS
10.0000 mg | Freq: Every day | ORAL | Status: AC
Start: 2020-08-17 — End: ?

## 2020-08-12 MED ORDER — HYDROCODONE-ACETAMINOPHEN 5-325 MG PO TABS
1.0000 | Freq: Four times a day (QID) | ORAL | Status: DC | PRN
Start: 2020-08-12 — End: 2020-08-13

## 2020-08-12 MED ORDER — TRAMADOL HCL 50 MG PO TABS
50.0000 mg | Freq: Four times a day (QID) | ORAL | 0 refills | Status: AC | PRN
Start: 2020-08-12 — End: ?

## 2020-08-12 MED ORDER — TRAMADOL HCL 50 MG PO TABS
50.0000 mg | Freq: Four times a day (QID) | ORAL | Status: DC | PRN
Start: 2020-08-12 — End: 2020-08-13
  Administered 2020-08-12: 50 mg via ORAL
  Filled 2020-08-12: qty 1

## 2020-08-12 NOTE — Progress Notes (Signed)
[name] to be D/C'd  per MD order. Discussed with the patient and all questions fully answered.  VSS, Skin clean, dry and intact without evidence of skin break down, no evidence of skin tears noted.  IV catheter discontinued intact. Site without signs and symptoms of complications. Dressing and pressure applied.  An After Visit Summary was printed and given to the patient. Patient received prescription.  D/c education completed with patient/family including follow up instructions, medication list, d/c activities limitations if indicated, with other d/c instructions as indicated by MD - patient able to verbalize understanding, all questions fully answered.   Patient instructed to return to ED, call 911, or call MD for any changes in condition.   Patient to be escorted via WC, and D/C home via private auto.

## 2020-08-12 NOTE — Progress Notes (Signed)
Daily Rounding Note  08/12/2020, 8:22 AM  LOS: 1 day   SUBJECTIVE:   Chief complaint:    Choledocholithiasis.  Biliary pancreatitis.  Hemodynamically, respiratory stable.  No fevers.   Epigastric region discomfort this morning, resolved with fentanyl.  Does not like to take the IV pain meds.  No bowel movements.  No nausea on clear liquids.  Feels much better than yesterday.  OBJECTIVE:         Vital signs in last 24 hours:    Temp:  [97.7 F (36.5 C)-99 F (37.2 C)] 98 F (36.7 C) (04/15 0414) Pulse Rate:  [54-92] 54 (04/15 0414) Resp:  [11-19] 17 (04/15 0414) BP: (134-167)/(63-83) 134/78 (04/15 0414) SpO2:  [94 %-97 %] 96 % (04/15 0414) Last BM Date: 08/11/20 Filed Weights   08/10/20 1914  Weight: 125 kg   General: Looks well.  Obese, NAD. Heart: RRR. Chest: No labored breathing or cough.  Lungs clear bilaterally. Abdomen: Soft.  No tenderness.  Active bowel sounds. Extremities: No CCE Neuro/Psych: Oriented x3.  Moves all 4 limbs without tremor.  Intake/Output from previous day: 04/14 0701 - 04/15 0700 In: 2535.8 [P.O.:360; I.V.:2062.5; IV Piggyback:113.3] Out: 5 [Blood:5]  Intake/Output this shift: No intake/output data recorded.  Lab Results: Recent Labs    08/10/20 2114 08/12/20 0027  WBC 14.0* 6.2  HGB 13.6 10.7*  HCT 40.4 32.4*  PLT 255 151   BMET Recent Labs    08/11/20 0236 08/12/20 0027  NA 136 135  K 3.9 3.5  CL 103 106  CO2 22 24  GLUCOSE 191* 111*  BUN 18 14  CREATININE 1.45* 1.62*  CALCIUM 9.1 8.4*   LFT Recent Labs    08/11/20 0236 08/12/20 0027  PROT 6.4* 5.4*  ALBUMIN 3.7 2.8*  AST 324* 113*  ALT 268* 178*  ALKPHOS 129* 101  BILITOT 3.3* 4.6*   PT/INR No results for input(s): LABPROT, INR in the last 72 hours. Hepatitis Panel No results for input(s): HEPBSAG, HCVAB, HEPAIGM, HEPBIGM in the last 72 hours.  Studies/Results: DG ERCP BILIARY & PANCREATIC  DUCTS  Result Date: 08/11/2020 CLINICAL DATA:  Choledocholithiasis. EXAM: ERCP TECHNIQUE: Multiple spot images obtained with the fluoroscopic device and submitted for interpretation post-procedure. COMPARISON:  CT a of the abdomen on 08/11/2020 FINDINGS: Imaging during ERCP demonstrates cannulation of the common bile duct with cholangiogram demonstrating filling defect in the distal common bile duct. Balloon sweep maneuver was performed to extract the calculus. IMPRESSION: ERCP confirms choledocholithiasis with at least 1 calculus creating a filling defect in the distal common bile duct. Balloon sweep maneuver was performed for calculus extraction. These images were submitted for radiologic interpretation only. Please see the procedural report for the amount of contrast and the fluoroscopy time utilized. Electronically Signed   By: Irish Lack M.D.   On: 08/11/2020 16:54   DG Abdomen Acute W/Chest  Result Date: 08/10/2020 CLINICAL DATA:  Chest and abdominal pain EXAM: DG ABDOMEN ACUTE WITH 1 VIEW CHEST COMPARISON:  11/08/2018 FINDINGS: Lungs are clear. No pneumothorax or pleural effusion. Coronary artery bypass grafting has been performed. Cardiac size within normal limits. Pulmonary vascularity is normal. Gas-filled loops of a dilated small bowel is seen within the epigastrium. Gas is seen within a nondistended colon. Together, the findings may reflect changes of a partial small bowel obstruction or represent a sentinel loop secondary to a a focal inflammatory process within the epigastrium. No free intraperitoneal gas. No organomegaly. No  abnormal calcifications within the abdomen and pelvis. IMPRESSION: No radiographic evidence of acute cardiopulmonary disease. Dilated gas-filled loop of small bowel within the epigastrium representative of either a proximal partial small bowel obstruction or a sentinel loop related to an adjacent inflammatory process. Electronically Signed   By: Helyn Numbers MD   On:  08/10/2020 23:16   CT Angio Abd/Pel W and/or Wo Contrast  Result Date: 08/11/2020 CLINICAL DATA:  Severe abdominal pain, diffuse tenderness to palpation, acute mesenteric ischemia EXAM: CTA ABDOMEN AND PELVIS WITHOUT AND WITH CONTRAST TECHNIQUE: Multidetector CT imaging of the abdomen and pelvis was performed using the standard protocol during bolus administration of intravenous contrast. Multiplanar reconstructed images and MIPs were obtained and reviewed to evaluate the vascular anatomy. CONTRAST:  OMNIPAQUE IOHEXOL 300 MG/ML  SOLN COMPARISON:  None. FINDINGS: VASCULAR Aorta: Normal caliber. No aneurysm or dissection. Extensive atherosclerotic calcification. No evidence of hemodynamically significant stenosis. No periaortic inflammatory change. Celiac: Less than 50% stenosis as its origin. Otherwise widely patent. No aneurysm or dissection. Conventional anatomic configuration. SMA: Less than 50% stenosis at its origin. Otherwise widely patent. No aneurysm or dissection. Renals: Left and 50% stenosis of the single left renal artery. Dual right renal arteries are widely patent. Normal vascular morphology. No aneurysm. IMA: Widely patent. Inflow: Widely patent. No aneurysm or dissection. Internal iliac arteries are patent bilaterally. Proximal Outflow: Widely patent. Veins: Unremarkable Review of the MIP images confirms the above findings. NON-VASCULAR Lower chest: The visualized lung bases are clear. 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 Extensive atherosclerotic calcification within the native coronary arteries. Global cardiac size is mildly enlarged Hepatobiliary: Cholecystectomy has been performed. No intra or extrahepatic biliary ductal dilation. A 6 mm rounded calculus, however, is seen within the distal common duct in keeping with choledocholithiasis. Liver unremarkable. Pancreas: Unremarkable  Spleen: Unremarkable Adrenals/Urinary Tract: 9 mm largely macroscopic fat containing nodule noted within the left adrenal gland is compatible with an adrenal myelolipoma. The adrenal glands are otherwise unremarkable. Multiple simple cortical cysts are seen within the a mid and lower pole of the left kidney. The kidneys are otherwise unremarkable. The bladder is unremarkable. Stomach/Bowel: Moderate sigmoid diverticulosis. There is mild circumferential wall thickening and hyperemia involving the ascending colon in keeping with changes of mild infectious or inflammatory colitis. No evidence of obstruction or perforation. The stomach, small bowel, and large bowel are otherwise unremarkable. Appendix normal. No free intraperitoneal gas or fluid. There is infiltration of the a mesenteric fat anteriorly subjacent to the a anterior abdominal wall most in keeping with fat necrosis and likely postsurgical in nature. This appears similar to that noted on prior examination. Lymphatic: No pathologic adenopathy within the abdomen and pelvis. Reproductive: Prostate is unremarkable. Other: Previously noted complex ventral hernia has undergone interval repair. Tiny residual fat containing umbilical hernia. Small fat containing right inguinal hernia is again noted. Musculoskeletal: No acute bone abnormality. No lytic or blastic bone lesion identified. IMPRESSION: VASCULAR Extensive atherosclerotic calcification. No evidence of hemodynamically significant stenosis of the a mesenteric or renal vasculature, however. Wide patency of the mesenteric arterial and venous vasculature. NON-VASCULAR Choledocholithiasis with 6 mm calcification noted within the distal common duct. Mild hyperemia and subtle circumferential wall thickening involving the ascending colon in keeping with mild infectious or inflammatory colitis. No evidence of obstruction or perforation. Moderate sigmoid diverticulosis without superimposed inflammatory change.  Interval ventral hernia repair. 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 Aortic Atherosclerosis (ICD10-I70.0). Electronically Signed   By: Helyn Numbers MD   On: 08/11/2020 04:14   Scheduled Meds: . finasteride  5 mg Oral Daily  . insulin aspart  0-9 Units Subcutaneous Q4H  . loratadine  10 mg Oral Daily  . pantoprazole  80 mg Oral Q1200  . rosuvastatin  10 mg Oral QHS   Continuous Infusions: . lactated ringers 175 mL/hr at 08/12/20 0700  . 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  GB perf, abscess liver GB fossa, perc chole tube, abscess drains >> lap chole >> repair duodenal fistula.      *   Biliary pancreatitis, elevated Lipase but pancreas unremarkable on imaging.  Lipase not repeated  *   S/p ERCP w balloon clearance of CBD stone following sphinct, biopsy of duodenal nodule, postsurgical changes with duodenal bulb sutures noted..  Atrophic gastric folds and gastric nodules noted. LFTs improved.    *   Subtle thickening of wall ascending colon.  12/2015 colonoscopy w TA polyp.     PLAN   *    Advance to carb modified diet.  If he tolerates this can go home this afternoon.  However need to clarify if he needs ongoing antibiotics for a few more days.  Currently Zosyn day 2  *   Fup w primary GI Dr Dorna Leitz at [facility] re non-biliary findings at ERCP yesterday as well as ? Surveillance colonoscopy.   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    *    messaged attending MD, see if pain meds can be switched to oral.    Jennye Moccasin  08/12/2020, 8:22 AM Phone [phone]

## 2020-08-12 NOTE — Progress Notes (Signed)
Called GI (Phone [phone]). They are sending a message to a GI MD to come see pt and verify antibiotics for pt before d/c.

## 2020-08-12 NOTE — Progress Notes (Signed)
GI called back. They will be getting in touch with Dr. Dayna Barker about antibiotics.

## 2020-08-12 NOTE — Discharge Summary (Signed)
Physician Discharge Summary  [name] ZOX:[id number] DOB: [birth year] DOA: 08/10/2020  PCP: Karle Plumber, MD  Admit date: 08/10/2020 Discharge date: 08/12/2020  Admitted From: home Disposition:  home  Recommendations for Outpatient Follow-up:  1. Follow up with PCP in 1-2 weeks 2. Please obtain CMP next week 3. No asa or nsaid x 1 wk 4. Please follow up on the following pending results:Dr BADREDDINE regaridng endoscopy findings on EGD  Home Health:no  Equipment/Devices: none  Discharge Condition: Stable Code Status:   Code Status: Full Code Diet recommendation:  Diet Order            Diet Carb Modified Fluid consistency: Thin; Room service appropriate? Yes  Diet effective now           Diet - low sodium heart healthy                  Brief/Interim Summary: 75 year old male with CAD s/p CABG, diabetes mellitus type 2 hypertension, hyperlipidemia, morbid obesity, OSA status postcholecystectomy in 2020, duodenal fistula repair in late 2020, ventral hernia repair with mesh in November 2021 presented with abdominal pain in the epigastric area, nausea and in the ED found to have abnormal LFTs CT abdomen pelvis showed 6 mm stone in CBD Patient also had lactic acidosis leukocytosis, was given IV fluids antibiotics and admitted Seen by GI underwent ERCP with stone removal. LFTs were improving. Seen by GI and tolerating diet okay to go home with outpatient follow-up with his primary GI in [facility] w/ Dr Rhea Belton and okay ot send home, no need for antibiotics  Discharge Diagnoses:  Choledocholithiasis with obstructive jaundice and biliary pancreatitis: Status post ERCP with stone removal.  LFTs downtrending.  Afebrile overnight, - treated with empiric Zosyn, wbc normal now and no need for abx as per GI.Patient needs to avoid aspirin and NSAIDs x1 week.  LFTs significantly downtrending. TB is up 3.3> 4.6. LFTs follow-up  As OP  AND op GI F/U for pathology results. Recent  Labs  Lab 08/11/20 0236 08/12/20 0027  AST 324* 113*  ALT 268* 178*  ALKPHOS 129* 101  BILITOT 3.3* 4.6*  PROT 6.4* 5.4*  ALBUMIN 3.7 2.8*   "ERCP The major papilla appeared normal.                           - The common bile duct was mildly dilated, with a                            stone causing an obstruction.                           - Prior cholecystectomy.                           - Choledocholithiasis was found.                           - Complete stone removal was accomplished by                            biliary sphincterotomy and balloon extraction.                           -  Gastric pylorus nodule. Biopsied.                           - Duodenal bulb sutures, post surgical changes.                           - Gastric antrum nodules.                           - Atrophic gastric folds. Recommendation:           - Return patient to hospital ward for ongoing care.                           - Observe patient's clinical course following                            today's ERCP with therapeutic intervention.                           - Clear liquid diet today as tolerated.                           - Avoid aspirin and nonsteroidal anti-inflammatory                            medicines for 1 week.                           - Trend LFTs.                           - Await path results.                           - EGD findings will need follow up with the                            patients primary gastroenterologist, Dr. Dorna Leitz, as an outpatient."  Leukocytosis/lactic acidosis:Due to #1 continue IV fluids,IV antibiotics.Hemodynamically stable and afebrile.Resolved Recent Labs  Lab 08/10/20 2114 08/12/20 0027  WBC 14.0* 6.2   CKDIIIIb: Baseline creatinine 1.5-1.8.  Remains a stable. ( not AKI) Recent Labs    03/02/20 0841 03/09/20 0148 03/10/20 0032 08/11/20 0236 08/12/20 0027  BUN 25* 26* 24* 18 14  CREATININE 1.53* 1.80*  1.55* 1.45* 1.62*   Essential hypertension:BP is stable. CAD/Hx of CABG Paroxysmal atrial flutter No chest pain.  Not on anticoagulation-previously on Eliquis and amiodarone per Dr Hazle Coca not in 3/15 Diabetes:HbA1c stable at 6.5.Blood sugar fairly controlled.  Continue current sliding scale insulin. Morbid obesity will benefit with weight loss. OSA: continue CPAP qhs. Consults:  GI  Subjective: ALERT,AWAKE, TOLERATING DIET,WANTS TO GO HOME  Discharge Exam: Vitals:   08/12/20 0930 08/12/20 1248  BP: (!) 144/67 (!) 143/64  Pulse: 60 78  Resp: 17   Temp: 98.6 F (37 C) 98.3 F (36.8 C)  SpO2: 94% 93%   General: Pt is alert, awake, not in acute distress Cardiovascular: RRR, S1/S2 +, no rubs, no gallops Respiratory: CTA bilaterally, no wheezing, no rhonchi Abdominal: Soft, NT, ND, bowel sounds + Extremities: no edema, no cyanosis  Discharge Instructions  Discharge Instructions    Diet - low sodium heart healthy   Complete by: As directed    Discharge instructions   Complete by: As directed    Please call call MD or return to ER for similar or worsening recurring problem that brought you to hospital or if any fever,nausea/vomiting,abdominal pain, uncontrolled pain, chest pain,  shortness of breath or any other alarming symptoms.  CHECK cmp FOR LIVER AND KIDNEY next week. See YOUR GI ENDOCRINE AS OP.                     Gastric pylorus nodule. Biopsied.                           - Duodenal bulb sutures, post surgical changes.                           - Gastric antrum nodules.                           - Atrophic gastric folds.                            - EGD findings will need follow up with the                            patients primary gastroenterologist, Dr. Dorna Leitz, as an outpatient.   Please follow-up your doctor as instructed in a week time and call the office for appointment.  Please avoid alcohol, smoking, or any other  illicit substance and maintain healthy habits including taking your regular medications as prescribed.  You were cared for by a hospitalist during your hospital stay. If you have any questions about your discharge medications or the care you received while you were in the hospital after you are discharged, you can call the unit and ask to speak with the hospitalist on call if the hospitalist that took care of you is not available.  Once you are discharged, your primary care physician will handle any further medical issues. Please note that NO REFILLS for any discharge medications will be authorized once you are discharged, as it is imperative that you return to your primary care physician (or establish a relationship with a primary care physician if you do not have one) for your aftercare needs so that they can reassess your need for medications and monitor your lab values   Increase activity slowly   Complete by: As directed      Allergies as of 08/12/2020      Reactions   Flagyl [metronidazole] Nausea And Vomiting   Ciprofloxacin Rash   Erythromycin Ethylsuccinate Rash      Medication List    STOP taking these medications   amLODipine 10 MG tablet Commonly known as: NORVASC   aspirin EC 81 MG tablet     TAKE these  medications   B-12 1000 MCG Tabs Take 1,000 mcg by mouth at bedtime.   cholecalciferol 25 MCG (1000 UNIT) tablet Commonly known as: VITAMIN D Take 1,000 Units by mouth in the morning and at bedtime.   esomeprazole 40 MG capsule Commonly known as: NEXIUM Take 40 mg by mouth daily.   finasteride 5 MG tablet Commonly known as: PROSCAR Take 5 mg by mouth daily.   loratadine 10 MG tablet Commonly known as: CLARITIN Take 10 mg by mouth daily.   losartan 50 MG tablet Commonly known as: COZAAR Take 50 mg by mouth daily.   metoprolol tartrate 50 MG tablet Commonly known as: LOPRESSOR Take 50 mg by mouth in the morning and at bedtime.   MULTIVITAMIN ADULT  PO Take 1 tablet by mouth daily.   rosuvastatin 10 MG tablet Commonly known as: CRESTOR Take 1 tablet (10 mg total) by mouth at bedtime. Start taking on: August 17, 2020 What changed: These instructions start on August 17, 2020. If you are unsure what to do until then, ask your doctor or other care provider.   traMADol 50 MG tablet Commonly known as: ULTRAM Take 1 tablet (50 mg total) by mouth every 6 (six) hours as needed for up to 10 doses for moderate pain.       Follow-up Information    Karle Plumber, MD Follow up in 1 week(s).   Specialty: Internal Medicine Contact information: [address] [place] [address] [phone]        Runell Gess, MD .   Specialties: Cardiology, Radiology Contact information: [address] [place] [address] [phone]              Allergies  Allergen Reactions  . Flagyl [Metronidazole] Nausea And Vomiting  . Ciprofloxacin Rash  . Erythromycin Ethylsuccinate Rash    The results of significant diagnostics from this hospitalization (including imaging, microbiology, ancillary and laboratory) are listed below for reference.    Microbiology: Recent Results (from the past 240 hour(s))  Resp Panel by RT-PCR (Flu A&B, Covid) Nasopharyngeal Swab     Status: None   Collection Time: 08/10/20 11:52 PM   Specimen: Nasopharyngeal Swab; Nasopharyngeal(NP) swabs in vial transport medium  Result Value Ref Range Status   SARS Coronavirus 2 by RT PCR NEGATIVE NEGATIVE Final    Comment: (NOTE) SARS-CoV-2 target nucleic acids are NOT DETECTED.  The SARS-CoV-2 RNA is generally detectable in upper respiratory specimens during the acute phase of infection. The lowest concentration of SARS-CoV-2 viral copies this assay can detect is 138 copies/mL. A negative result does not preclude SARS-Cov-2 infection and should not be used as the sole basis for treatment or other patient management decisions. A negative result  may occur with  improper specimen collection/handling, submission of specimen other than nasopharyngeal swab, presence of viral mutation(s) within the areas targeted by this assay, and inadequate number of viral copies(<138 copies/mL). A negative result must be combined with clinical observations, patient history, and epidemiological information. The expected result is Negative.  Fact Sheet for Patients:  BloggerCourse.com  Fact Sheet for Healthcare Providers:  SeriousBroker.it  This test is no t yet approved or cleared by the [place] FDA and  has been authorized for detection and/or diagnosis of SARS-CoV-2 by FDA under an Emergency Use Authorization (EUA). This EUA will remain  in effect (meaning this test can be used) for the duration of the COVID-19 declaration under Section 564(b)(1) of the Act, 21 U.S.C.section 360bbb-3(b)(1), unless  the authorization is terminated  or revoked sooner.       Influenza A by PCR NEGATIVE NEGATIVE Final   Influenza B by PCR NEGATIVE NEGATIVE Final    Comment: (NOTE) The Xpert Xpress SARS-CoV-2/FLU/RSV plus assay is intended as an aid in the diagnosis of influenza from Nasopharyngeal swab specimens and should not be used as a sole basis for treatment. Nasal washings and aspirates are unacceptable for Xpert Xpress SARS-CoV-2/FLU/RSV testing.  Fact Sheet for Patients: BloggerCourse.com  Fact Sheet for Healthcare Providers: SeriousBroker.it  This test is not yet approved or cleared by the [place] FDA and has been authorized for detection and/or diagnosis of SARS-CoV-2 by FDA under an Emergency Use Authorization (EUA). This EUA will remain in effect (meaning this test can be used) for the duration of the COVID-19 declaration under Section 564(b)(1) of the Act, 21 U.S.C. section 360bbb-3(b)(1), unless the authorization is terminated  or revoked.  Performed at [facility], [address], [place], [place] [address]     Procedures/Studies: DG ERCP BILIARY & PANCREATIC DUCTS  Result Date: 08/11/2020 CLINICAL DATA:  Choledocholithiasis. EXAM: ERCP TECHNIQUE: Multiple spot images obtained with the fluoroscopic device and submitted for interpretation post-procedure. COMPARISON:  CT a of the abdomen on 08/11/2020 FINDINGS: Imaging during ERCP demonstrates cannulation of the common bile duct with cholangiogram demonstrating filling defect in the distal common bile duct. Balloon sweep maneuver was performed to extract the calculus. IMPRESSION: ERCP confirms choledocholithiasis with at least 1 calculus creating a filling defect in the distal common bile duct. Balloon sweep maneuver was performed for calculus extraction. These images were submitted for radiologic interpretation only. Please see the procedural report for the amount of contrast and the fluoroscopy time utilized. Electronically Signed   By: Irish Lack M.D.   On: 08/11/2020 16:54   DG Abdomen Acute W/Chest  Result Date: 08/10/2020 CLINICAL DATA:  Chest and abdominal pain EXAM: DG ABDOMEN ACUTE WITH 1 VIEW CHEST COMPARISON:  11/08/2018 FINDINGS: Lungs are clear. No pneumothorax or pleural effusion. Coronary artery bypass grafting has been performed. Cardiac size within normal limits. Pulmonary vascularity is normal. Gas-filled loops of a dilated small bowel is seen within the epigastrium. Gas is seen within a nondistended colon. Together, the findings may reflect changes of a partial small bowel obstruction or represent a sentinel loop secondary to a a focal inflammatory process within the epigastrium. No free intraperitoneal gas. No organomegaly. No abnormal calcifications within the abdomen and pelvis. IMPRESSION: No radiographic evidence of acute cardiopulmonary disease. Dilated gas-filled loop of small bowel within the epigastrium representative of either a  proximal partial small bowel obstruction or a sentinel loop related to an adjacent inflammatory process. Electronically Signed   By: Helyn Numbers MD   On: 08/10/2020 23:16   CT Angio Abd/Pel W and/or Wo Contrast  Result Date: 08/11/2020 CLINICAL DATA:  Severe abdominal pain, diffuse tenderness to palpation, acute mesenteric ischemia EXAM: CTA ABDOMEN AND PELVIS WITHOUT AND WITH CONTRAST TECHNIQUE: Multidetector CT imaging of the abdomen and pelvis was performed using the standard protocol during bolus administration of intravenous contrast. Multiplanar reconstructed images and MIPs were obtained and reviewed to evaluate the vascular anatomy. CONTRAST:  OMNIPAQUE IOHEXOL 300 MG/ML  SOLN COMPARISON:  None. FINDINGS: VASCULAR Aorta: Normal caliber. No aneurysm or dissection. Extensive atherosclerotic calcification. No evidence of hemodynamically significant stenosis. No periaortic inflammatory change. Celiac: Less than 50% stenosis as its origin. Otherwise widely patent. No aneurysm or dissection. Conventional anatomic configuration. SMA: Less  than 50% stenosis at its origin. Otherwise widely patent. No aneurysm or dissection. Renals: Left and 50% stenosis of the single left renal artery. Dual right renal arteries are widely patent. Normal vascular morphology. No aneurysm. IMA: Widely patent. Inflow: Widely patent. No aneurysm or dissection. Internal iliac arteries are patent bilaterally. Proximal Outflow: Widely patent. Veins: Unremarkable Review of the MIP images confirms the above findings. NON-VASCULAR Lower chest: The visualized lung bases are clear. 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 Extensive atherosclerotic calcification within the native coronary arteries. Global cardiac size is mildly enlarged Hepatobiliary: Cholecystectomy has been performed. No intra or extrahepatic biliary ductal dilation.  A 6 mm rounded calculus, however, is seen within the distal common duct in keeping with choledocholithiasis. Liver unremarkable. Pancreas: Unremarkable Spleen: Unremarkable Adrenals/Urinary Tract: 9 mm largely macroscopic fat containing nodule noted within the left adrenal gland is compatible with an adrenal myelolipoma. The adrenal glands are otherwise unremarkable. Multiple simple cortical cysts are seen within the a mid and lower pole of the left kidney. The kidneys are otherwise unremarkable. The bladder is unremarkable. Stomach/Bowel: Moderate sigmoid diverticulosis. There is mild circumferential wall thickening and hyperemia involving the ascending colon in keeping with changes of mild infectious or inflammatory colitis. No evidence of obstruction or perforation. The stomach, small bowel, and large bowel are otherwise unremarkable. Appendix normal. No free intraperitoneal gas or fluid. There is infiltration of the a mesenteric fat anteriorly subjacent to the a anterior abdominal wall most in keeping with fat necrosis and likely postsurgical in nature. This appears similar to that noted on prior examination. Lymphatic: No pathologic adenopathy within the abdomen and pelvis. Reproductive: Prostate is unremarkable. Other: Previously noted complex ventral hernia has undergone interval repair. Tiny residual fat containing umbilical hernia. Small fat containing right inguinal hernia is again noted. Musculoskeletal: No acute bone abnormality. No lytic or blastic bone lesion identified. IMPRESSION: VASCULAR Extensive atherosclerotic calcification. No evidence of hemodynamically significant stenosis of the a mesenteric or renal vasculature, however. Wide patency of the mesenteric arterial and venous vasculature. NON-VASCULAR Choledocholithiasis with 6 mm calcification noted within the distal common duct. Mild hyperemia and subtle circumferential wall thickening involving the ascending colon in keeping with mild  infectious or inflammatory colitis. No evidence of obstruction or perforation. Moderate sigmoid diverticulosis without superimposed inflammatory change. Interval ventral hernia repair. 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 Aortic Atherosclerosis (ICD10-I70.0). Electronically Signed   By: Helyn NumbersAshesh  Parikh MD   On: 08/11/2020 04:14    Labs: BNP (last 3 results) No results for input(s): BNP in the last 8760 hours. Basic Metabolic Panel: Recent Labs  Lab 08/11/20 0236 08/12/20 0027  NA 136 135  K 3.9 3.5  CL 103 106  CO2 22 24  GLUCOSE 191* 111*  BUN 18 14  CREATININE 1.45* 1.62*  CALCIUM 9.1 8.4*   Liver Function Tests: Recent Labs  Lab 08/11/20 0236 08/12/20 0027  AST 324* 113*  ALT 268* 178*  ALKPHOS 129* 101  BILITOT 3.3* 4.6*  PROT 6.4* 5.4*  ALBUMIN 3.7 2.8*   Recent Labs  Lab 08/11/20 0510  LIPASE 1,225*   No results for input(s): AMMONIA in the last 168 hours. CBC: Recent Labs  Lab 08/10/20 2114 08/12/20 0027  WBC 14.0* 6.2  HGB 13.6 10.7*  HCT 40.4 32.4*  MCV 90.4 92.0  PLT 255 151   Cardiac Enzymes: No results for input(s):  CKTOTAL, CKMB, CKMBINDEX, TROPONINI in the last 168 hours. BNP: Invalid input(s): POCBNP CBG: Recent Labs  Lab 08/11/20 2041 08/12/20 0002 08/12/20 0412 08/12/20 0756 08/12/20 1139  GLUCAP 219* 123* 118* 135* 174*   D-Dimer No results for input(s): DDIMER in the last 72 hours. Hgb A1c Recent Labs    08/11/20 0445  HGBA1C 6.5*   Lipid Profile No results for input(s): CHOL, HDL, LDLCALC, TRIG, CHOLHDL, LDLDIRECT in the last 72 hours. Thyroid function studies No results for input(s): TSH, T4TOTAL, T3FREE, THYROIDAB in the last 72 hours.  Invalid input(s): FREET3 Anemia work up No results for input(s): VITAMINB12, FOLATE, FERRITIN, TIBC, IRON, RETICCTPCT in the last 72 hours. Urinalysis    Component Value Date/Time   COLORURINE YELLOW 08/10/2020  1914   APPEARANCEUR CLEAR 08/10/2020 1914   LABSPEC 1.011 08/10/2020 1914   PHURINE 5.0 08/10/2020 1914   GLUCOSEU NEGATIVE 08/10/2020 1914   HGBUR NEGATIVE 08/10/2020 1914   BILIRUBINUR NEGATIVE 08/10/2020 1914   KETONESUR NEGATIVE 08/10/2020 1914   PROTEINUR NEGATIVE 08/10/2020 1914   NITRITE NEGATIVE 08/10/2020 1914   LEUKOCYTESUR NEGATIVE 08/10/2020 1914   Sepsis Labs Invalid input(s): PROCALCITONIN,  WBC,  LACTICIDVEN Microbiology Recent Results (from the past 240 hour(s))  Resp Panel by RT-PCR (Flu A&B, Covid) Nasopharyngeal Swab     Status: None   Collection Time: 08/10/20 11:52 PM   Specimen: Nasopharyngeal Swab; Nasopharyngeal(NP) swabs in vial transport medium  Result Value Ref Range Status   SARS Coronavirus 2 by RT PCR NEGATIVE NEGATIVE Final    Comment: (NOTE) SARS-CoV-2 target nucleic acids are NOT DETECTED.  The SARS-CoV-2 RNA is generally detectable in upper respiratory specimens during the acute phase of infection. The lowest concentration of SARS-CoV-2 viral copies this assay can detect is 138 copies/mL. A negative result does not preclude SARS-Cov-2 infection and should not be used as the sole basis for treatment or other patient management decisions. A negative result may occur with  improper specimen collection/handling, submission of specimen other than nasopharyngeal swab, presence of viral mutation(s) within the areas targeted by this assay, and inadequate number of viral copies(<138 copies/mL). A negative result must be combined with clinical observations, patient history, and epidemiological information. The expected result is Negative.  Fact Sheet for Patients:  BloggerCourse.com  Fact Sheet for Healthcare Providers:  SeriousBroker.it  This test is no t yet approved or cleared by the [place] FDA and  has been authorized for detection and/or diagnosis of SARS-CoV-2 by FDA under an Emergency  Use Authorization (EUA). This EUA will remain  in effect (meaning this test can be used) for the duration of the COVID-19 declaration under Section 564(b)(1) of the Act, 21 U.S.C.section 360bbb-3(b)(1), unless the authorization is terminated  or revoked sooner.       Influenza A by PCR NEGATIVE NEGATIVE Final   Influenza B by PCR NEGATIVE NEGATIVE Final    Comment: (NOTE) The Xpert Xpress SARS-CoV-2/FLU/RSV plus assay is intended as an aid in the diagnosis of influenza from Nasopharyngeal swab specimens and should not be used as a sole basis for treatment. Nasal washings and aspirates are unacceptable for Xpert Xpress SARS-CoV-2/FLU/RSV testing.  Fact Sheet for Patients: BloggerCourse.com  Fact Sheet for Healthcare Providers: SeriousBroker.it  This test is not yet approved or cleared by the [place] FDA and has been authorized for detection and/or diagnosis of SARS-CoV-2 by FDA under an Emergency Use Authorization (EUA). This EUA will remain in effect (meaning this test can be used) for the duration of  the COVID-19 declaration under Section 564(b)(1) of the Act, 21 U.S.C. section 360bbb-3(b)(1), unless the authorization is terminated or revoked.  Performed at [facility], [address] [place], [place] [address]      Time coordinating discharge: 25 minutes  SIGNED: Lanae Boast, MD  [facility] 08/12/2020, 3:36 PM  If 7PM-7AM, please contact night-coverage [email or web address]

## 2020-08-15 LAB — SURGICAL PATHOLOGY

## 2020-08-16 ENCOUNTER — Encounter

## 2020-08-16 ENCOUNTER — Encounter: Payer: Self-pay | Admitting: Gastroenterology

## 2022-04-09 ENCOUNTER — Ambulatory Visit: Payer: Medicare HMO | Admitting: Neurology

## 2022-04-09 ENCOUNTER — Encounter
